# Patient Record
Sex: Female | Born: 1940 | Race: White | Hispanic: No | Smoking: Former smoker
Health system: Southern US, Community
[De-identification: ages and names within clinical notes are randomized; demographics above are authoritative.]

## PROBLEM LIST (undated history)

## (undated) DIAGNOSIS — I7300 Raynaud's syndrome without gangrene: Secondary | ICD-10-CM

## (undated) DIAGNOSIS — I341 Nonrheumatic mitral (valve) prolapse: Secondary | ICD-10-CM

## (undated) DIAGNOSIS — C9000 Multiple myeloma not having achieved remission: Secondary | ICD-10-CM

## (undated) DIAGNOSIS — M8580 Other specified disorders of bone density and structure, unspecified site: Secondary | ICD-10-CM

## (undated) DIAGNOSIS — J449 Chronic obstructive pulmonary disease, unspecified: Secondary | ICD-10-CM

## (undated) DIAGNOSIS — K635 Polyp of colon: Secondary | ICD-10-CM

## (undated) DIAGNOSIS — I739 Peripheral vascular disease, unspecified: Secondary | ICD-10-CM

## (undated) DIAGNOSIS — C4370 Malignant melanoma of unspecified lower limb, including hip: Secondary | ICD-10-CM

## (undated) DIAGNOSIS — B009 Herpesviral infection, unspecified: Secondary | ICD-10-CM

## (undated) DIAGNOSIS — N814 Uterovaginal prolapse, unspecified: Secondary | ICD-10-CM

## (undated) DIAGNOSIS — E039 Hypothyroidism, unspecified: Secondary | ICD-10-CM

## (undated) DIAGNOSIS — Z8619 Personal history of other infectious and parasitic diseases: Secondary | ICD-10-CM

## (undated) DIAGNOSIS — E7800 Pure hypercholesterolemia, unspecified: Secondary | ICD-10-CM

## (undated) DIAGNOSIS — I8390 Asymptomatic varicose veins of unspecified lower extremity: Secondary | ICD-10-CM

## (undated) DIAGNOSIS — Z860100 Personal history of colon polyps, unspecified: Secondary | ICD-10-CM

## (undated) HISTORY — PX: BREAST SURGERY: SHX581

## (undated) HISTORY — DX: Multiple myeloma not having achieved remission: C90.00

## (undated) HISTORY — DX: Raynaud's syndrome without gangrene: I73.00

## (undated) HISTORY — DX: Hypothyroidism, unspecified: E03.9

## (undated) HISTORY — DX: Personal history of other infectious and parasitic diseases: Z86.19

## (undated) HISTORY — DX: Nonrheumatic mitral (valve) prolapse: I34.1

## (undated) HISTORY — DX: Polyp of colon: K63.5

## (undated) HISTORY — PX: OTHER SURGICAL HISTORY: SHX169

## (undated) HISTORY — DX: Personal history of colon polyps, unspecified: Z86.0100

## (undated) HISTORY — DX: Peripheral vascular disease, unspecified: I73.9

## (undated) HISTORY — PX: TONSILLECTOMY: SUR1361

## (undated) HISTORY — DX: Uterovaginal prolapse, unspecified: N81.4

## (undated) HISTORY — DX: Personal history of colonic polyps: Z86.010

## (undated) HISTORY — DX: Asymptomatic varicose veins of unspecified lower extremity: I83.90

## (undated) HISTORY — DX: Other specified disorders of bone density and structure, unspecified site: M85.80

## (undated) HISTORY — DX: Malignant melanoma of unspecified lower limb, including hip: C43.70

## (undated) HISTORY — DX: Pure hypercholesterolemia, unspecified: E78.00

## (undated) HISTORY — DX: Herpesviral infection, unspecified: B00.9

---

## 1997-07-25 ENCOUNTER — Encounter: Admission: RE | Admit: 1997-07-25 | Discharge: 1997-10-23 | Payer: Self-pay | Admitting: Internal Medicine

## 1997-08-01 ENCOUNTER — Encounter: Admission: RE | Admit: 1997-08-01 | Discharge: 1997-08-09 | Payer: Self-pay | Admitting: Internal Medicine

## 1997-09-09 ENCOUNTER — Other Ambulatory Visit: Admission: RE | Admit: 1997-09-09 | Discharge: 1997-09-09 | Payer: Self-pay | Admitting: *Deleted

## 1997-09-24 ENCOUNTER — Emergency Department (HOSPITAL_COMMUNITY): Admission: EM | Admit: 1997-09-24 | Discharge: 1997-09-24 | Payer: Self-pay | Admitting: Emergency Medicine

## 1997-10-06 ENCOUNTER — Emergency Department (HOSPITAL_COMMUNITY): Admission: EM | Admit: 1997-10-06 | Discharge: 1997-10-06 | Payer: Self-pay | Admitting: Emergency Medicine

## 1997-12-30 ENCOUNTER — Encounter: Admission: RE | Admit: 1997-12-30 | Discharge: 1998-03-30 | Payer: Self-pay | Admitting: *Deleted

## 1998-01-06 ENCOUNTER — Encounter: Admission: RE | Admit: 1998-01-06 | Discharge: 1998-04-06 | Payer: Self-pay | Admitting: *Deleted

## 1998-05-29 ENCOUNTER — Other Ambulatory Visit: Admission: RE | Admit: 1998-05-29 | Discharge: 1998-05-29 | Payer: Self-pay | Admitting: Internal Medicine

## 1998-08-05 ENCOUNTER — Other Ambulatory Visit: Admission: RE | Admit: 1998-08-05 | Discharge: 1998-08-05 | Payer: Self-pay | Admitting: *Deleted

## 1999-09-30 ENCOUNTER — Other Ambulatory Visit: Admission: RE | Admit: 1999-09-30 | Discharge: 1999-09-30 | Payer: Self-pay | Admitting: *Deleted

## 2000-06-27 ENCOUNTER — Encounter: Admission: RE | Admit: 2000-06-27 | Discharge: 2000-09-25 | Payer: Self-pay | Admitting: Occupational Medicine

## 2000-09-26 ENCOUNTER — Encounter: Admission: RE | Admit: 2000-09-26 | Discharge: 2000-11-03 | Payer: Self-pay | Admitting: Occupational Medicine

## 2000-10-11 ENCOUNTER — Other Ambulatory Visit: Admission: RE | Admit: 2000-10-11 | Discharge: 2000-10-11 | Payer: Self-pay | Admitting: *Deleted

## 2001-01-31 ENCOUNTER — Encounter: Admission: RE | Admit: 2001-01-31 | Discharge: 2001-03-13 | Payer: Self-pay | Admitting: Occupational Medicine

## 2001-11-17 ENCOUNTER — Other Ambulatory Visit: Admission: RE | Admit: 2001-11-17 | Discharge: 2001-11-17 | Payer: Self-pay | Admitting: Obstetrics and Gynecology

## 2002-11-21 ENCOUNTER — Encounter (INDEPENDENT_AMBULATORY_CARE_PROVIDER_SITE_OTHER): Payer: Self-pay | Admitting: *Deleted

## 2002-11-21 ENCOUNTER — Ambulatory Visit (HOSPITAL_BASED_OUTPATIENT_CLINIC_OR_DEPARTMENT_OTHER): Admission: RE | Admit: 2002-11-21 | Discharge: 2002-11-21 | Payer: Self-pay | Admitting: General Surgery

## 2002-11-21 ENCOUNTER — Ambulatory Visit (HOSPITAL_COMMUNITY): Admission: RE | Admit: 2002-11-21 | Discharge: 2002-11-21 | Payer: Self-pay | Admitting: General Surgery

## 2003-02-27 ENCOUNTER — Other Ambulatory Visit: Admission: RE | Admit: 2003-02-27 | Discharge: 2003-02-27 | Payer: Self-pay | Admitting: Obstetrics and Gynecology

## 2004-05-05 ENCOUNTER — Other Ambulatory Visit: Admission: RE | Admit: 2004-05-05 | Discharge: 2004-05-05 | Payer: Self-pay | Admitting: Obstetrics and Gynecology

## 2004-07-06 ENCOUNTER — Ambulatory Visit: Payer: Self-pay | Admitting: Pulmonary Disease

## 2005-09-03 ENCOUNTER — Other Ambulatory Visit: Admission: RE | Admit: 2005-09-03 | Discharge: 2005-09-03 | Payer: Self-pay | Admitting: Obstetrics and Gynecology

## 2005-09-08 ENCOUNTER — Ambulatory Visit: Payer: Self-pay | Admitting: Pulmonary Disease

## 2005-09-08 ENCOUNTER — Ambulatory Visit: Payer: Self-pay | Admitting: Family Medicine

## 2006-02-01 ENCOUNTER — Ambulatory Visit: Payer: Self-pay | Admitting: Pulmonary Disease

## 2006-10-06 ENCOUNTER — Ambulatory Visit: Payer: Self-pay | Admitting: Pulmonary Disease

## 2006-10-06 LAB — CONVERTED CEMR LAB
BUN: 12 mg/dL (ref 6–23)
Basophils Absolute: 0 10*3/uL (ref 0.0–0.1)
Bilirubin Urine: NEGATIVE
Bilirubin, Direct: 0.1 mg/dL (ref 0.0–0.3)
Direct LDL: 101 mg/dL
Eosinophils Absolute: 0.2 10*3/uL (ref 0.0–0.6)
GFR calc Af Amer: 129 mL/min
GFR calc non Af Amer: 106 mL/min
Glucose, Bld: 101 mg/dL — ABNORMAL HIGH (ref 70–99)
HDL: 96.7 mg/dL (ref >39.0)
Hemoglobin, Urine: NEGATIVE
Hemoglobin: 14.6 g/dL (ref 12.0–15.0)
Leukocytes, UA: NEGATIVE
Lymphocytes Relative: 51.3 % — ABNORMAL HIGH (ref 12.0–46.0)
MCHC: 33.8 g/dL (ref 30.0–36.0)
MCV: 96.3 fL (ref 78.0–100.0)
Monocytes Absolute: 1.4 10*3/uL — ABNORMAL HIGH (ref 0.2–0.7)
Monocytes Relative: 11.3 % — ABNORMAL HIGH (ref 3.0–11.0)
Neutro Abs: 4.4 10*3/uL (ref 1.4–7.7)
Nitrite: NEGATIVE
Potassium: 4.7 meq/L (ref 3.5–5.1)
Sodium: 141 meq/L (ref 135–145)
TSH: 0.86 microintl units/mL (ref 0.35–5.50)
Total CHOL/HDL Ratio: 2.2
Total Protein, Urine: NEGATIVE mg/dL
Triglycerides: 47 mg/dL (ref 0–149)
VLDL: 9 mg/dL (ref 0–40)
Vit D, 1,25-Dihydroxy: 38 (ref 20–57)

## 2007-03-10 ENCOUNTER — Other Ambulatory Visit: Admission: RE | Admit: 2007-03-10 | Discharge: 2007-03-10 | Payer: Self-pay | Admitting: Obstetrics and Gynecology

## 2007-11-06 ENCOUNTER — Telehealth (INDEPENDENT_AMBULATORY_CARE_PROVIDER_SITE_OTHER): Payer: Self-pay | Admitting: *Deleted

## 2007-11-09 ENCOUNTER — Ambulatory Visit: Payer: Self-pay | Admitting: Internal Medicine

## 2007-11-09 ENCOUNTER — Encounter: Payer: Self-pay | Admitting: Pulmonary Disease

## 2007-11-10 ENCOUNTER — Encounter: Payer: Self-pay | Admitting: Pulmonary Disease

## 2007-12-08 ENCOUNTER — Telehealth: Payer: Self-pay | Admitting: Pulmonary Disease

## 2008-01-17 DIAGNOSIS — D126 Benign neoplasm of colon, unspecified: Secondary | ICD-10-CM

## 2008-01-17 DIAGNOSIS — Z8679 Personal history of other diseases of the circulatory system: Secondary | ICD-10-CM

## 2008-01-17 DIAGNOSIS — F411 Generalized anxiety disorder: Secondary | ICD-10-CM

## 2008-01-17 DIAGNOSIS — E039 Hypothyroidism, unspecified: Secondary | ICD-10-CM

## 2008-01-18 ENCOUNTER — Ambulatory Visit: Payer: Self-pay | Admitting: Pulmonary Disease

## 2008-01-18 DIAGNOSIS — M899 Disorder of bone, unspecified: Secondary | ICD-10-CM | POA: Insufficient documentation

## 2008-01-18 DIAGNOSIS — M949 Disorder of cartilage, unspecified: Secondary | ICD-10-CM

## 2008-01-18 DIAGNOSIS — E7800 Pure hypercholesterolemia, unspecified: Secondary | ICD-10-CM

## 2008-04-02 ENCOUNTER — Encounter: Payer: Self-pay | Admitting: Pulmonary Disease

## 2008-05-31 DIAGNOSIS — I8390 Asymptomatic varicose veins of unspecified lower extremity: Secondary | ICD-10-CM

## 2008-06-07 ENCOUNTER — Ambulatory Visit: Payer: Self-pay | Admitting: Internal Medicine

## 2008-06-24 ENCOUNTER — Ambulatory Visit: Payer: Self-pay | Admitting: Internal Medicine

## 2008-06-24 ENCOUNTER — Telehealth: Payer: Self-pay | Admitting: Internal Medicine

## 2008-06-24 ENCOUNTER — Encounter: Payer: Self-pay | Admitting: Internal Medicine

## 2008-06-25 ENCOUNTER — Encounter: Payer: Self-pay | Admitting: Internal Medicine

## 2008-07-22 ENCOUNTER — Telehealth: Payer: Self-pay | Admitting: Internal Medicine

## 2008-11-15 ENCOUNTER — Encounter: Payer: Self-pay | Admitting: Pulmonary Disease

## 2008-11-28 ENCOUNTER — Encounter: Payer: Self-pay | Admitting: Pulmonary Disease

## 2009-03-10 ENCOUNTER — Telehealth: Payer: Self-pay | Admitting: Pulmonary Disease

## 2009-03-11 ENCOUNTER — Ambulatory Visit: Payer: Self-pay | Admitting: Pulmonary Disease

## 2009-03-13 LAB — CONVERTED CEMR LAB
ALT: 21 units/L (ref 0–35)
BUN: 11 mg/dL (ref 6–23)
Basophils Relative: 0.8 % (ref 0.0–3.0)
CO2: 27 meq/L (ref 19–32)
Chloride: 108 meq/L (ref 96–112)
Eosinophils Relative: 0.7 % (ref 0.0–5.0)
Glucose, Bld: 83 mg/dL (ref 70–99)
HDL: 88.5 mg/dL (ref >39.00)
Hemoglobin: 14.1 g/dL (ref 12.0–15.0)
Lymphocytes Relative: 41.8 % (ref 12.0–46.0)
Monocytes Relative: 9.2 % (ref 3.0–12.0)
Neutrophils Relative %: 47.5 % (ref 43.0–77.0)
Potassium: 4.6 meq/L (ref 3.5–5.1)
RBC: 4.37 M/uL (ref 3.87–5.11)
TSH: 0.66 microintl units/mL (ref 0.35–5.50)
Total Protein: 7.8 g/dL (ref 6.0–8.3)
Triglycerides: 52 mg/dL (ref 0.0–149.0)
VLDL: 10.4 mg/dL (ref 0.0–40.0)
Vit D, 25-Hydroxy: 51 ng/mL (ref 30–89)
WBC: 12.9 10*3/uL — ABNORMAL HIGH (ref 4.5–10.5)

## 2009-03-14 ENCOUNTER — Ambulatory Visit: Payer: Self-pay | Admitting: Pulmonary Disease

## 2009-03-18 LAB — CONVERTED CEMR LAB
Specific Gravity, Urine: <=1.005 (ref 1.000–1.030)
Total Protein, Urine: NEGATIVE mg/dL
Urine Glucose: NEGATIVE mg/dL
Urobilinogen, UA: 0.2 (ref 0.0–1.0)
pH: 5 (ref 5.0–8.0)

## 2009-11-17 ENCOUNTER — Encounter: Payer: Self-pay | Admitting: Pulmonary Disease

## 2010-03-11 ENCOUNTER — Other Ambulatory Visit: Payer: Self-pay | Admitting: Pulmonary Disease

## 2010-03-11 ENCOUNTER — Ambulatory Visit
Admission: RE | Admit: 2010-03-11 | Discharge: 2010-03-11 | Payer: Self-pay | Source: Home / Self Care | Attending: Pulmonary Disease | Admitting: Pulmonary Disease

## 2010-03-11 LAB — CBC WITH DIFFERENTIAL/PLATELET
Basophils Absolute: 0.1 10*3/uL (ref 0.0–0.1)
Eosinophils Absolute: 0.2 10*3/uL (ref 0.0–0.7)
Lymphocytes Relative: 50 % — ABNORMAL HIGH (ref 12.0–46.0)
MCHC: 33.5 g/dL (ref 30.0–36.0)
MCV: 97.9 fl (ref 78.0–100.0)
Monocytes Absolute: 1 10*3/uL (ref 0.1–1.0)
Neutro Abs: 5.3 10*3/uL (ref 1.4–7.7)
Neutrophils Relative %: 40.6 % — ABNORMAL LOW (ref 43.0–77.0)
RDW: 14 % (ref 11.5–14.6)

## 2010-03-11 LAB — TSH: TSH: 0.85 u[IU]/mL (ref 0.35–5.50)

## 2010-03-11 LAB — BASIC METABOLIC PANEL
BUN: 14 mg/dL (ref 6–23)
Calcium: 9.7 mg/dL (ref 8.4–10.5)
Creatinine, Ser: 0.5 mg/dL (ref 0.4–1.2)
GFR: 121.4 mL/min (ref >60.00)
Sodium: 142 mEq/L (ref 135–145)

## 2010-03-11 LAB — LIPID PANEL: HDL: 89.4 mg/dL (ref >39.00)

## 2010-03-11 LAB — LDL CHOLESTEROL, DIRECT: Direct LDL: 116.6 mg/dL

## 2010-03-11 LAB — HEPATIC FUNCTION PANEL
AST: 22 U/L (ref 0–37)
Total Bilirubin: 0.8 mg/dL (ref 0.3–1.2)

## 2010-03-15 LAB — CONVERTED CEMR LAB
AST: 23 units/L (ref 0–37)
Bilirubin Urine: NEGATIVE
Bilirubin, Direct: 0.1 mg/dL (ref 0.0–0.3)
CO2: 29 meq/L (ref 19–32)
Calcium: 9.7 mg/dL (ref 8.4–10.5)
Chloride: 108 meq/L (ref 96–112)
Creatinine, Ser: 0.6 mg/dL (ref 0.4–1.2)
GFR calc non Af Amer: 106 mL/min
HDL: 80.7 mg/dL (ref >39.0)
LDL Cholesterol: 96 mg/dL (ref 0–99)
Leukocytes, UA: NEGATIVE
MCV: 97.2 fL (ref 78.0–100.0)
Mucus, UA: NEGATIVE
Nitrite: NEGATIVE
Platelets: 247 10*3/uL (ref 150–400)
RBC / HPF: NONE SEEN
RBC: 4.19 M/uL (ref 3.87–5.11)
Sodium: 144 meq/L (ref 135–145)
Specific Gravity, Urine: <=1.005 (ref 1.000–1.03)
TSH: 0.44 microintl units/mL (ref 0.35–5.50)
Total Bilirubin: 0.8 mg/dL (ref 0.3–1.2)
Total CHOL/HDL Ratio: 2.4
Total Protein, Urine: NEGATIVE mg/dL
VLDL: 13 mg/dL (ref 0–40)
WBC: 12.3 10*3/uL — ABNORMAL HIGH (ref 4.5–10.5)
pH: 5.5 (ref 5.0–8.0)

## 2010-03-17 NOTE — Assessment & Plan Note (Signed)
Summary: cpx/fasting/apc   CC:  Yearly ROV & review of mult medical problems....  History of Present Illness: 70 y/o WF here for a follow up visit... she has multiple medical problems as noted below...     ~  March 11, 2009:  she's had a good yr- no new complaints or concerns... unfortunately she continues to smoke 1/2 ppd- denies smoker's cough, phlegm, SOB/ DOE, etc... she is not interested in smoking cessation help> we discussed working w/ her husb to help him lose wt & her to quit smoking... she does note some dysuria, & right ear pain> check UA & Rx w/ Cortisporin Otic... she uses Cimetadine 300mg  Prn for "upset stomach" & requests refill... she had colonoscopy 5/10  DrBrodie w/ mild divertics & cecal adenoma removed...      Current Problems:   ALLERGIC RHINITIS - uses FLONASE Prn...  ASTHMATIC BRONCHITIS, ACUTE (ICD-466.0) - no recent bronchitic exac... not on regular meds.  ~  CXR 12/09 showed hyperinflation & incr markings, spondylosis in TSpine...  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  CIGARETTE SMOKER (ICD-305.1) - still smokes 1/2 ppd... we have had numerous conversations about smoking cessation during every OV but she is not interested in med rx or help w/ this problem...  MITRAL VALVE PROLAPSE, HX OF (ICD-V12.50) - on ASA 81mg /d... hx mild MVP on 2DEcho in 1995 (no signif MR)... asymptomatic without CP or palpit...  VARICOSE VEIN (ICD-456.8) - hx mild VV and ven insuffic w/ intermittent edema... she knows to avoid sodium, elevate legs, wear support hose, etc...  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  ~  colonoscopy 4/00 by DrDBrodie showed several 6-24mm polyps= adenomatous...  ~  colonoscopy 4/05 by DrDBrodie showed only melanosis and hems...  ~  colonoscopy 5/10 DrDBrodie showed mild divertics, 2 cecal polyps- tubular adenoma, f/u 46yrs.  OSTEOPENIA (ICD-733.90) - on FOSAMAX 70mg /wk, calcium, MVI, Vit D 1000 u daily...  ~  BMD here 8/07 showed TScores -1.2 in Spine, and -1.8 in left FemNeck.  ~  labs 8/08 showed Vit D level= 38  ~  BMD here 9/09 showed TScores -0.8 in Spine, and -1.5 in left FemNeck.  ~  labs 1/11 showed Vit D level = 51  ANXIETY (ICD-300.00) - not currently on meds.  Health Maintenance - neg Mamogram at [facility] 9/09 & 10/10... due for GYN check by [facility] & she will call.  ~  Immunizations:  she had the seasonal Flu vaccine in 2009, but refused the H1N1 shot; received the 2010 Flu shot 10/10...  she received Shingles vaccine 2/09 at [facility]... had PNEUMOVAX in 2002...     Allergies: 1)  ! Codeine  Comments:  Nurse/Medical Assistant: The patient's medications and allergies were reviewed with the patient and were updated in the Medication and Allergy Lists.  Past History:  Past Medical History: ASTHMATIC BRONCHITIS, ACUTE (ICD-466.0) CIGARETTE SMOKER (ICD-305.1) MITRAL VALVE PROLAPSE, HX OF (ICD-V12.50) VARICOSE VEIN (ICD-456.8) HYPERCHOLESTEROLEMIA, BORDERLINE, WITH HIGH HDL (ICD-272.0) HYPOTHYROIDISM (ICD-244.9) COLONIC POLYPS (ICD-211.3) OSTEOPENIA (ICD-733.90) ANXIETY (  ICD-300.00)  Past Surgical History: S/P T & A S/P C-section S/P right breast lumpectomy 10/04 by DrPYoung (benign)  Family History: Reviewed history and no changes required. mother deceased age 29 from heart disease father deceased age 70 from heart disease only child grandmother hx of DM after age 49  Social History: Reviewed history and no changes required. current smoker---1/2 ppd exposed to second hand smoke exercises 3 times per week--walks  and run steps at work caffeine use--4 cups daily no alcohol use married 1 child  Review of Systems      See HPI  The patient denies anorexia, fever, weight loss, weight gain, vision loss, decreased hearing, hoarseness, chest pain, syncope, dyspnea on exertion, peripheral edema, prolonged cough, headaches, hemoptysis, abdominal pain, melena, hematochezia, severe indigestion/heartburn, hematuria, incontinence, muscle weakness, suspicious skin lesions, transient blindness, difficulty walking, depression, unusual weight change, abnormal bleeding, enlarged lymph nodes, and angioedema.    Vital Signs:  Patient profile:   70 year old female Height:      62 inches Weight:      134 pounds BMI:     24.60 O2 Sat:      98 % on Room air Temp:     97.4 degrees F oral Pulse rate:   68 / minute BP sitting:   150 / 72  (right arm) Cuff size:   regular  Vitals Entered By: Randell Loop CMA (March 11, 2009 9:52 AM)  O2 Sat at Rest %:  98 O2 Flow:  Room air CC: Yearly ROV & review of mult medical problems... Comments no changes in meds-----BP repeated in right arm---138-72 and BP taken in left arm---130-68   Physical Exam  Additional Exam:  WD, WN, 70 y/o WF in NAD...  GENERAL:  Alert & oriented; pleasant & cooperative... HEENT:  [place]/AT, EOM-wnl, PERRLA,  EACs-clear, TMs-wnl, NOSE-clear, THROAT-clear & wnl. NECK:  Supple w/ fairROM; no JVD; normal carotid impulses w/o bruits; no thyromegaly or nodules palpated; no lymphadenopathy. CHEST:  Clear to P & A; without wheezes/ rales/ or rhonchi. HEART:  Regular Rhythm; without murmurs/ rubs/ or gallops. ABDOMEN:  Soft & nontender; normal bowel sounds; no organomegaly or masses detected. EXT: without deformities or arthritic changes; mild varicose veins/ venous insuffic/ tr edema. NEURO:  CN's intact; motor testing normal; sensory testing normal; gait normal & balance OK. DERM:  No lesions noted; no rash etc...     CXR  Procedure date:   03/11/2009  Findings:      CHEST - 2 VIEW   Comparison: Chest x-ray of 01/18/2008   Findings: The lungs are clear.  Mediastinal contours are normal. The heart is within normal limits in size.  There are degenerative changes throughout the thoracic spine.   IMPRESSION: No active lung disease.   Read By:  Juline Patch,  M.D.       MISC. Report  Procedure date:  03/11/2009  Findings:      Lipid Panel (LIPID)   Cholesterol          [H]  206 mg/dL                   2-956   Triglycerides             52.0 mg/dL                  2.1-308.6   HDL  88.50 mg/dL                 >16.10 Cholesterol LDL - Direct                             104.7 mg/dL  Hepatic/Liver Function Panel (HEPATIC)   Total Bilirubin           0.5 mg/dL                   9.6-0.4   Direct Bilirubin          0.1 mg/dL                   5.4-0.9   Alkaline Phosphatase      53 U/L                      39-117   AST                       30 U/L                      0-37   ALT                       21 U/L                      0-35   Total Protein             7.8 g/dL                    8.1-1.9   Albumin                   4.1 g/dL                    1.4-7.8  CBC Platelet w/Diff (CBCD)   White Cell Count     [H]  12.9 K/uL                   4.5-10.5   Red Cell Count            4.37 Mil/uL                 3.87-5.11   Hemoglobin                14.1 g/dL                   29.5-62.1   Hematocrit                43.3 %                      36.0-46.0   MCV                       99.2 fl                     78.0-100.0   Platelet Count            248.0 K/uL                  150.0-400.0   Neutrophil %              47.5 %  43.0-77.0   Lymphocyte %              41.8 %                      12.0-46.0   Monocyte %                9.2 %                       3.0-12.0   Eosinophils%              0.7 %                       0.0-5.0   Basophils %               0.8 %                Comments:       BMP (METABOL)   Sodium                    142 mEq/L                   135-145   Potassium                 4.6 mEq/L                   3.5-5.1   Chloride                  108 mEq/L                   96-112   Carbon Dioxide            27 mEq/L                    19-32   Glucose                   83 mg/dL                    04-54   BUN                       11 mg/dL                    0-98   Creatinine                0.7 mg/dL                   1.1-9.1   Calcium                   9.6 mg/dL                   4.7-82.9   GFR                       88.32 mL/min                >60  TSH (TSH)   FastTSH                   0.66 uIU/mL                 0.35-5.50   Impression & Recommendations:  Problem # 1:  ASTHMATIC  BRONCHITIS, ACUTE (ICD-466.0) No recent exac... CXR is OK. Orders: T-2 View CXR (71020TC)  Problem # 2:  CIGARETTE SMOKER (ICD-305.1) We discussed smoking cessation strategies including cessation programs, counselling, nicotine replacement, and Chantix receptor blockade... the pt is not interested at this time but we left the door open should she like to reconsider at any time.   Problem # 3:  MITRAL VALVE PROLAPSE, HX OF (ICD-V12.50) No angina or cardiac symptoms...  Problem # 4:  HYPERCHOLESTEROLEMIA, BORDERLINE, WITH HIGH HDL (ICD-272.0) FLP looks OK, w/ good HDL...   Problem # 5:  HYPOTHYROIDISM (ICD-244.9) Stable on Synthroid-  continue same. Her updated medication list for this problem includes:    Levothroid 100 Mcg Tabs (Levothyroxine sodium) .[name]... 1 tablet by mouth once a day  Problem # 6:  COLONIC POLYPS (ICD-211.3) GI per DrDBrodie...   Problem # 7:  OSTEOPENIA (ICD-733.90) Continue Fosamax etc... Her updated medication list for this problem includes:    Alendronate Sodium 70 Mg Tabs (Alendronate sodium) .[name]... 1 by mouth every week  Problem # 8:  OTHER MEDICAL PROBLEMS AS NOTED>>>  Complete Medication List: 1)  Fluticasone Propionate 50 Mcg/act Susp  (Fluticasone propionate) .... 2 sprays in each nostirl two times a day 2)  Aspirin Adult Low Strength 81 Mg Tbec (Aspirin) .... Take 1 tablet by mouth once a day 3)  Levothroid 100 Mcg Tabs (Levothyroxine sodium) .[name].. 1 tablet by mouth once a day 4)  Cimetidine 300 Mg Tabs (Cimetidine) .... Take 1 tablet by mouth once a day 5)  Alendronate Sodium 70 Mg Tabs (Alendronate sodium) .[name].. 1 by mouth every week 6)  Multivitamins Caps (Multiple vitamin) .... Take 1 tablet by mouth once a day 7)  Vitamin D 1000 Unit Tabs (Cholecalciferol) .... .take 1 tablet by mouth once a day 8)  Lutein 20 Mg Tabs (Lutein) .... Take 1 tablet by mouth once a day 9)  Anamantle Hc 3-0.5 % Crea (Lidocaine-hydrocortisone ace) .... Apply to rectum 3-4 times daily. 10)  Anusol-hc 25 Mg Supp (Hydrocortisone acetate) .... Put one into rectum twice daily for 5 days. 11)  Cortisporin-tc 3.04-17-08-0.5 Mg/ml Susp (Neomycin-colist-hc-thonzonium) .[name].. 1-2 drops in right ear three times a day as directed...  Other Orders: Prescription Created Electronically [phone])  Patient Instructions: 1)  Today we updated your med list- see below.... 2)  We refilled your meds per request... 3)  Today we did your follow up CXR & FASTING blood work... please call the "phone tree" in a few days for your lab results.Marland KitchenMarland Kitchen 4)  Nasreen- you need to quit smoking!!!  Work out a deal w/ Merlyn Albert- "cigarettes for pounds" it's a WIN-WIN.Marland KitchenMarland Kitchen 5)  Call for any problems.Marland KitchenMarland Kitchen 6)  Please schedule a follow-up appointment in 1 year, sooner as needed... Prescriptions: CORTISPORIN-TC 3.04-17-08-0.5 MG/ML SUSP (NEOMYCIN-COLIST-HC-THONZONIUM) 1-2 drops in right ear three times a day as directed...  #1 vial x 1   Entered and Authorized by:   Michele Mcalpine MD   Signed by:   Michele Mcalpine MD on 03/11/2009   Method used:   Print then Give to Patient   RxID:   [id number] CIMETIDINE 300 MG TABS (CIMETIDINE) Take 1 tablet by mouth once a day  #30 x prn   Entered and Authorized  by:   Michele Mcalpine MD   Signed by:   Michele Mcalpine MD on 03/11/2009   Method used:   Print then Give to Patient   RxID:   [id number] FLUTICASONE PROPIONATE 50 MCG/ACT SUSP (  FLUTICASONE PROPIONATE) 2 sprays in each nostirl two times a day  #1 x prn   Entered and Authorized by:   Michele Mcalpine MD   Signed by:   Michele Mcalpine MD on 03/11/2009   Method used:   Print then Give to Patient   RxID:   [id number] ALENDRONATE SODIUM 70 MG TABS (ALENDRONATE SODIUM) 1 by mouth every week  #4 x prn   Entered and Authorized by:   Michele Mcalpine MD   Signed by:   Michele Mcalpine MD on 03/11/2009   Method used:   Print then Give to Patient   RxID:   [id number] LEVOTHROID 100 MCG TABS (LEVOTHYROXINE SODIUM) 1 tablet by mouth once a day  #30 x prn   Entered and Authorized by:   Michele Mcalpine MD   Signed by:   Michele Mcalpine MD on 03/11/2009   Method used:   Print then Give to Patient   RxID:   [id number]

## 2010-03-17 NOTE — Progress Notes (Signed)
Summary: ORDER FOR LABS  Phone Note Call from Patient   Caller: Patient Call For: Countess Biebel Summary of Call: NEED ORDER FOR LABS PUT IN FOR CPX Initial call taken by: Rickard Patience,  March 10, 2009 10:57 AM  Follow-up for Phone Call        Patient coming in for CPX on 03/11/09. Please advise.Michel Bickers CMA  March 10, 2009 11:00 AM   ok for pt to have labs prior to ov per SN..  labs in computer for pt and pt is aware Randell Loop [facility]  March 10, 2009 11:40 AM

## 2010-03-24 DIAGNOSIS — K219 Gastro-esophageal reflux disease without esophagitis: Secondary | ICD-10-CM | POA: Insufficient documentation

## 2010-04-02 NOTE — Assessment & Plan Note (Signed)
Summary: 1 year return/mhh   CC:  Yearly ROV & review of mult medical problems....  History of Present Illness: 70 y/o WF here for a follow up visit... she has multiple medical problems as noted below...     ~  March 11, 2009:  she's had a good yr- no new complaints or concerns... unfortunately she continues to smoke 1/2 ppd- denies smoker's cough, phlegm, SOB/ DOE, etc... she is not interested in smoking cessation help> we discussed working w/ her husb to help him lose wt & her to quit smoking... she does note some dysuria, & right ear pain> check UA & Rx w/ Cortisporin Otic... she uses Cimetadine 300mg  Prn for "upset stomach" & requests refill... she had colonoscopy 5/10  DrBrodie w/ mild divertics & cecal adenoma removed...    ~  March 11, 2010:  she tripped 6/11 w/ fx 5th metatarsal req cast, boot, etc per DrRendall- now resolved... still smoking but down less than 1/2 ppd she says & denies smokers cough, sputum, SOB/ DOE, etc... denies CP, palpit, or cardiac problems- exerc by walking some she says... she reports prolapsed bladder per GYN... CXR- NAD, & Labs- stable w/ hi HDL.Marland KitchenMarland Kitchen OK Pneumovax & TDAP today.   Current Problems:   ALLERGIC RHINITIS - uses FLONASE Prn...  ASTHMATIC BRONCHITIS, ACUTE (ICD-466.0) - no recent bronchitic exac... not on regular meds.  ~  CXR 12/09 showed hyperinflation & incr markings, spondylosis in TSpine...  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  CIGARETTE SMOKER (ICD-305.1) - still smokes 1/2 ppd... we have had numerous conversations about smoking cessation during every OV but she is not interested in med rx or help w/ this problem...  MITRAL VALVE PROLAPSE, HX OF (ICD-V12.50) - on ASA 81mg /d... hx mild MVP on 2DEcho in 1995 (no signif MR)... asymptomatic without CP or palpit...  VARICOSE VEIN (ICD-456.8) - hx mild VV and ven insuffic w/ intermittent edema... she knows to avoid sodium, elevate legs, wear support hose,  etc...  HYPERCHOLESTEROLEMIA, BORDERLINE, WITH HIGH HDL (ICD-272.0)  ~  FLP 8/08 showed TChol 211, TG 47, HDL 97, LDL 101  ~  FLP 12/09 showed TChol 190, TG 65, HDL 81, LDL 96  ~  FLP 1/11 showed TChol 206, TG 52, HDL 89, LDL 105  ~  FLP 1/12 showed TChol 221, TG 62, HDL 89, LDL 117  HYPOTHYROIDISM (ICD-244.9) - on LEVOTHYROID 173mcg/d...  ~  labs 12/09 showed TSH= 0.44  ~  labs 1/11 showed TSH= 0.66  ~  labs 1/12 showed TSH= 0.85  ESOPHAGEAL REFLUX (ICD-530.81) - she uses Cimetadine 300mg  Prn for "Stomach" & requests refill from [place]...  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  ~  colonoscopy 4/00 by DrDBrodie showed several 6-20mm polyps= adenomatous...  ~  colonoscopy 4/05 by DrDBrodie showed only melanosis and hems...  ~  colonoscopy 5/10 DrDBrodie showed mild divertics, 2 cecal polyps- tubular adenoma, f/u 33yrs.  OSTEOPENIA (ICD-733.90) - on calcium, MVI, Vit D 2000 u daily... she stopped her prev Fosamax rx on her own in 2011.  ~  BMD here 8/07 showed TScores -1.2 in Spine, and -1.8 in left FemNeck.  ~  labs 8/08 showed Vit D level= 38  ~  BMD here 9/09 showed TScores -0.8 in Spine, and -1.5 in left FemNeck.  ~  labs 1/11 showed Vit D level = 51  ANXIETY (ICD-300.00) - not currently on meds.  Health Maintenance:  ~  neg  Mamogram at [facility]- followed yearly... due for GYN check by [facility] & she will call.  ~  Immunizations:  she gets the yearly seasonal Flu vaccines... she received Shingles vaccine 2/09 at [facility]... had PNEUMOVAX in 2002 & 1/12 (age 21)... given TDAP 1/12.  Current Meds:  FLUTICASONE PROPIONATE 50 MCG/ACT SUSP (FLUTICASONE PROPIONATE) 2 sprays in each nostirl two times a day ASPIRIN ADULT LOW STRENGTH 81 MG TBEC (ASPIRIN) Take 1 tablet by mouth once a day LEVOTHROID 100 MCG TABS (LEVOTHYROXINE SODIUM) 1 tablet by mouth once a day CIMETIDINE 300 MG TABS (CIMETIDINE) Take 1 tablet by mouth once a day CALCIUM 1000 + D  1000-800 MG-UNIT TABS (CALCIUM CARB-CHOLECALCIFEROL) Take 1 tablet by mouth once a day MULTIVITAMINS  CAPS (MULTIPLE VITAMIN) Take 1 tablet by mouth once a day VITAMIN D 1000 UNIT  TABS (CHOLECALCIFEROL) .Take 2 tablets  by mouth once a day LUTEIN 20 MG TABS (LUTEIN) Take 1 tablet by mouth once a day ANAMANTLE HC 3-0.5 % CREA (LIDOCAINE-HYDROCORTISONE ACE) Apply to rectum 3-4 times daily. ANUSOL-HC 25 MG  SUPP (HYDROCORTISONE ACETATE) Put one into rectum twice daily for 5 days. CORTISPORIN-TC 3.04-17-08-0.5 MG/ML SUSP (NEOMYCIN-COLIST-HC-THONZONIUM) 1-2 drops in right ear three times a day as directed...    Preventive Screening-Counseling & Management  Alcohol-Tobacco     Smoking Status: current     Packs/Day: 0.5  Allergies: 1)  ! Codeine  Comments:  Nurse/Medical Assistant: The patient's medications and allergies were reviewed with the patient and were updated in the Medication and Allergy Lists.  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 mother deceased age 59 from heart disease father deceased age 70 from heart disease only child grandmother hx of DM after age 81  Social History: Reviewed history from 03/11/2009 and no changes required. current smoker---1/2 ppd exposed to second hand smoke exercises 3 times per week--walks and run steps at work caffeine use--4 cups daily no alcohol use married 1 child Packs/Day:  0.5  Review of Systems       The patient  complains of fatigue, dyspnea on exertion, cough, gas/bloating, incontinence, joint pain, arthritis, and anxiety.  The patient denies fever, chills, sweats, anorexia, weakness, malaise, weight loss, sleep disorder, blurring, diplopia, eye irritation, eye discharge, vision loss, eye pain, photophobia, earache, ear discharge, tinnitus, decreased hearing, nasal congestion, nosebleeds, sore throat, hoarseness, chest pain, palpitations, syncope, orthopnea, PND, peripheral edema, dyspnea at rest, excessive sputum, hemoptysis, wheezing, pleurisy, nausea, vomiting, diarrhea, constipation, change in bowel habits, abdominal pain, melena, hematochezia, jaundice, indigestion/heartburn, dysphagia, odynophagia, dysuria, hematuria, urinary frequency, urinary hesitancy, nocturia, back pain, joint swelling, muscle cramps, muscle weakness, stiffness, sciatica, restless legs, leg pain at night, leg pain with exertion, rash, itching, dryness, suspicious lesions, paralysis, paresthesias, seizures, tremors, vertigo, transient blindness, frequent falls, frequent headaches, difficulty walking, depression, memory loss, confusion, cold intolerance, heat intolerance, polydipsia, polyphagia, polyuria, unusual weight change, abnormal bruising, bleeding, enlarged lymph nodes, urticaria, allergic rash, hay fever, and recurrent infections.    Vital Signs:  Patient profile:   70 year old female Height:      62 inches Weight:      138.50 pounds BMI:  25.42 O2 Sat:      100 % on Room air Temp:     97.7 degrees F oral Pulse rate:   67 / minute BP sitting:   126 / 74  (right arm) Cuff size:   regular  Vitals Entered By: Randell Loop CMA (March 11, 2010 10:06 AM)  O2 Sat at Rest %:  100 O2 Flow:  Room air CC: Yearly ROV & review of mult medical problems... Is Patient Diabetic? No Pain Assessment Patient in pain? no      Comments meds updated today with pt   Physical Exam  Additional Exam:  WD, WN, 70 y/o WF in NAD...   GENERAL:  Alert & oriented; pleasant & cooperative... HEENT:  /AT, EOM-wnl, PERRLA, EACs-clear, TMs-wnl, NOSE-clear, THROAT-clear & wnl. NECK:  Supple w/ fairROM; no JVD; normal carotid impulses w/o bruits; no thyromegaly or nodules palpated; no lymphadenopathy. CHEST:  Clear to P & A; without wheezes/ rales/ or rhonchi. HEART:  Regular Rhythm; without murmurs/ rubs/ or gallops. ABDOMEN:  Soft & nontender; normal bowel sounds; no organomegaly or masses detected. EXT: without deformities or arthritic changes; mild varicose veins/ venous insuffic/ tr edema. NEURO:  CN's intact; motor testing normal; sensory testing normal; gait normal & balance OK. DERM:  No lesions noted; no rash etc...    Impression & Recommendations:  Problem # 1:  ASTHMATIC BRONCHITIS, ACUTE (ICD-466.0) She is minimally symptomatic & must quit smoking... she does not want regular meds, inhalers, etc... Orders: TLB-BMP (Basic Metabolic Panel-BMET) (80048-METABOL) TLB-Hepatic/Liver Function Pnl (80076-HEPATIC) TLB-CBC Platelet - w/Differential (85025-CBCD) TLB-Lipid Panel (80061-LIPID) TLB-TSH (Thyroid Stimulating Hormone) (84443-TSH) T-2 View CXR (71020TC)  Problem # 2:  CIGARETTE SMOKER (ICD-305.1) CXR w/o acute changes>  she knows the importance of smoking cessation & the risks of heart dis, vasc dis & strokes, lung dis & lung cancer etc... offered smoking cessation help but she is not interested beyond the OTC avil therapies... Orders: T-2 View CXR (71020TC)  Problem # 3:  MITRAL VALVE PROLAPSE, HX OF (ICD-V12.50) Stable w/o CP, palpit, SOB etc...  Problem # 4:  VARICOSE VEIN (ICD-456.8) Aware> she knows to avoid sodium, elevate, wear support hose, etc...  Problem # 5:  HYPERCHOLESTEROLEMIA, BORDERLINE, WITH HIGH HDL (ICD-272.0) She had borderline TChol & LDL w/ excellent HDL value... she is not inclined to take low dose statin Rx & prefers diet + exercise.  Problem # 6:  HYPOTHYROIDISM  (ICD-244.9) Stable on Levo100/d...  Her updated medication list for this problem includes:    Levothroid 100 Mcg Tabs (Levothyroxine sodium) .[name]... 1 tablet by mouth once a day  Problem # 7:  COLONIC POLYPS (ICD-211.3) Followed by DrDBrodie & up to date on screening etc...  Problem # 8:  OSTEOPENIA (ICD-733.90) She stopped her Fosamax about 1 yr ago... on Calcium, MVI, VitD & wt bearing exercises... we discussed f/u BMD to check for deterioration. The following medications were removed from the medication list:    Alendronate Sodium 70 Mg Tabs (Alendronate sodium) .[name]... 1 by mouth every week  Complete Medication List: 1)  Fluticasone Propionate 50 Mcg/act Susp (Fluticasone propionate) .... 2 sprays in each nostirl two times a day 2)  Aspirin Adult Low Strength 81 Mg Tbec (Aspirin) .... Take 1 tablet by mouth once a day 3)  Levothroid 100 Mcg Tabs (Levothyroxine sodium) .[name].. 1 tablet by mouth once a day 4)  Cimetidine 300 Mg Tabs (Cimetidine) .... Take 1 tablet by mouth once a day 5)  Calcium 1000 + D  1000-800 Mg-unit Tabs (Calcium carb-cholecalciferol) .... Take 1 tablet by mouth once a day 6)  Multivitamins Caps (Multiple vitamin) .... Take 1 tablet by mouth once a day 7)  Vitamin D 1000 Unit Tabs (Cholecalciferol) .... .take 2 tablets  by mouth once a day 8)  Lutein 20 Mg Tabs (Lutein) .... Take 1 tablet by mouth once a day 9)  Anamantle Hc 3-0.5 % Crea (Lidocaine-hydrocortisone ace) .... Apply to rectum 3-4 times daily. 10)  Anusol-hc 25 Mg Supp (Hydrocortisone acetate) .... Put one into rectum twice daily for 5 days. 11)  Cortisporin-tc 3.04-17-08-0.5 Mg/ml Susp (Neomycin-colist-hc-thonzonium) .[name].. 1-2 drops in right ear three times a day as directed...  Other Orders: Pneumococcal Vaccine (16109) Admin 1st Vaccine (60454) Tdap => 73yrs IM (09811) Admin of Any Addtl Vaccine (91478)  Patient Instructions: 1)  Today we updated your med list- see below.... 2)  We refilled your meds for  2012... 3)  Today we did your follow up CXR & FASTINg blood work... please call the "phone tree" in a few days for your lab results.Marland KitchenMarland Kitchen 4)  [place], you need to QUIT SMOKING (Pllleeeaaassee!) 5)  Call for any problems.Marland KitchenMarland Kitchen 6)  Please schedule a follow-up appointment in 1 year, sooner as needed. Prescriptions: CIMETIDINE 300 MG TABS (CIMETIDINE) Take 1 tablet by mouth once a day  #30 x prn   Entered and Authorized by:   Michele Mcalpine MD   Signed by:   Michele Mcalpine MD on 03/11/2010   Method used:   Print then Give to Patient   RxID:   [id number] LEVOTHROID 100 MCG TABS (LEVOTHYROXINE SODIUM) 1 tablet by mouth once a day  #30 x prn   Entered and Authorized by:   Michele Mcalpine MD   Signed by:   Michele Mcalpine MD on 03/11/2010   Method used:   Print then Give to Patient   RxID:   [id number] FLUTICASONE PROPIONATE 50 MCG/ACT SUSP (FLUTICASONE PROPIONATE) 2 sprays in each nostirl two times a day  #1 x prn   Entered and Authorized by:   Michele Mcalpine MD   Signed by:   Michele Mcalpine MD on 03/11/2010   Method used:   Print then Give to Patient   RxID:   [id number]    Immunization History:  Influenza Immunization History:    Influenza:  historical (10/28/2009)  Immunizations Administered:  Pneumonia Vaccine:    Vaccine Type: Pneumovax (Medicare)    Site: right deltoid    Mfr: Merck    Dose: 0.5 ml    Route: IM    Given by: Randell Loop CMA    Exp. Date: 07/10/2011    Lot #: [id number]    VIS given: 01/20/09 version given March 11, 2010.  Tetanus Vaccine:    Vaccine Type: Tdap    Site: left deltoid    Mfr: GlaxoSmithKline    Dose: 0.5 ml    Route: IM    Given by: Randell Loop CMA    Exp. Date: 12/05/2011    Lot #: [id number]    VIS given: 01/03/08 version given March 11, 2010.     Orders Added: 1)  Est. Patient Level V [29518] 2)  Pneumococcal Vaccine [90732] 3)  Admin 1st Vaccine [90471] 4)  Tdap => 45yrs IM [90715] 5)  Admin of Any Addtl Vaccine  [90472] 6)  TLB-BMP (Basic Metabolic Panel-BMET) [80048-METABOL] 7)  TLB-Hepatic/Liver Function Pnl [80076-HEPATIC] 8)  TLB-CBC Platelet - w/Differential [85025-CBCD] 9)  TLB-Lipid Panel [80061-LIPID]  10)  TLB-TSH (Thyroid Stimulating Hormone) [84443-TSH] 11)  T-2 View CXR [71020TC]

## 2010-07-03 NOTE — Op Note (Signed)
   NAME:  Ariel Holmes, Ariel Holmes                          ACCOUNT NO.:  [id number]   MEDICAL RECORD NO.:  [id number]                   PATIENT TYPE:  AMB   LOCATION:  [facility]SC                                  FACILITY:  [facility]   PHYSICIAN:  Rose Phi. Maple Hudson, M.D.                DATE OF BIRTH:  [birth year]   DATE OF PROCEDURE:  11/21/2002  DATE OF DISCHARGE:                                 OPERATIVE REPORT   PREOPERATIVE DIAGNOSIS:  Probable radial scar, right breast.   POSTOPERATIVE DIAGNOSIS:  Probable radial scar, right breast.   OPERATION PERFORMED:  Right breast biopsy with needle localization and  specimen mammography.   SURGEON:  Rose Phi. Maple Hudson, M.D.   ANESTHESIA:  MAC.   DESCRIPTION OF PROCEDURE:  The patient was placed on the operating table and  the right breast prepped and draped in the usual fashion.  The area in the  lateral portion of the right breast and the wire was coming in from the  lateral side.  A curvilinear incision was outlined with a marking pencil and  then thoroughly infiltrated with the local anesthetic.  Incision was made  and the wire delivered into the incision.  Then we did a wide excision of  the wire and surrounding tissue.  Hemostasis was obtained with the cautery.  Specimen oriented for the pathologist.  Specimen mammography confirmed the  removal of the lesion.  With good hemostasis, subcuticular closure with 4-0  Monocryl and Steri-Strips carried out.  Dressing applied.  The patient was  then transferred to the recovery room in satisfactory condition having  tolerated the procedure well.                                                Rose Phi. Maple Hudson, M.D.    PRY/MEDQ  D:  11/21/2002  T:  11/21/2002  Job:  [id number]

## 2010-11-23 ENCOUNTER — Encounter: Payer: Self-pay | Admitting: Pulmonary Disease

## 2010-12-11 ENCOUNTER — Other Ambulatory Visit: Payer: Self-pay | Admitting: Dermatology

## 2010-12-22 ENCOUNTER — Encounter (INDEPENDENT_AMBULATORY_CARE_PROVIDER_SITE_OTHER): Payer: Self-pay

## 2010-12-23 ENCOUNTER — Other Ambulatory Visit (INDEPENDENT_AMBULATORY_CARE_PROVIDER_SITE_OTHER): Payer: Self-pay | Admitting: General Surgery

## 2010-12-23 ENCOUNTER — Ambulatory Visit (INDEPENDENT_AMBULATORY_CARE_PROVIDER_SITE_OTHER): Payer: Medicare Other | Admitting: General Surgery

## 2010-12-23 ENCOUNTER — Encounter (INDEPENDENT_AMBULATORY_CARE_PROVIDER_SITE_OTHER): Payer: Self-pay | Admitting: General Surgery

## 2010-12-23 VITALS — BP 162/80 | HR 70 | Temp 97.2°F | Resp 16 | Ht 63.0 in | Wt 143.4 lb

## 2010-12-23 DIAGNOSIS — C4370 Malignant melanoma of unspecified lower limb, including hip: Secondary | ICD-10-CM

## 2010-12-23 NOTE — Progress Notes (Signed)
Subjective:     Patient ID: Ariel Holmes, female   DOB: 12/16/1940, 70 y.o.   MRN: [id number]  HPI Patient presents with a recent diagnosis of melanoma left upper inner thigh. The area first developed as a bumper. It then developed a very dark color and she went to see her dermatologist. Shave biopsy was done. 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 At that was 0.62 mm. Lateral margins were positive. The patient has no complaints at the biopsy site. She does note a small lesion near to it but she claims is what this lesion initially looked like and she has requested to have that removed at the same time.  Review of Systems  Constitutional: Negative for fever, chills and unexpected weight change.  HENT: Negative for hearing loss, congestion, sore throat, trouble swallowing and voice change.   Eyes: Negative for visual disturbance.  Respiratory: Negative for cough and wheezing.   Cardiovascular: Negative for chest pain, palpitations and leg swelling.  Gastrointestinal: Negative for nausea, vomiting, abdominal pain, diarrhea, constipation, blood in stool, abdominal distention and anal bleeding.  Genitourinary: Negative for hematuria, vaginal bleeding and difficulty urinating.  Musculoskeletal: Negative for arthralgias.  Skin: Negative for rash and wound.  Neurological: Negative for seizures, syncope and headaches.  Hematological: Negative for adenopathy. Does not bruise/bleed easily.  Psychiatric/Behavioral: Negative for confusion.  Additional skin: The lesion next to Her biopsy site as described above     Objective:   Physical Exam  Constitutional: She is oriented to person, place, and time. She appears well-developed and well-nourished.  HENT:  Head: Normocephalic and atraumatic.  Eyes: Conjunctivae and EOM are normal. Pupils are equal, round, and reactive to light.  Neck: Normal range of motion. Neck supple. No tracheal deviation present.  Cardiovascular:  Normal rate, regular rhythm and normal heart sounds.   No murmur heard. Pulmonary/Chest: Effort normal and breath sounds normal. No respiratory distress. She has no wheezes.  Abdominal: Soft. Bowel sounds are normal. She exhibits no distension. There is no tenderness. There is no rebound and no guarding.  Musculoskeletal: Normal range of motion.  Neurological: She is alert and oriented to person, place, and time.  Skin: Skin is warm and dry.  Biopsy site left upper inner thigh is healing well without signs of infection. There is a 5 mm raised skin lesion medial to the biopsy site. There is no discoloration. Lymphatic exam reveals no supraclavicular, cervical, bilateral axillary, or bilateral inguinal lymphadenopathy    Assessment:     Melanoma left upper inner thigh, Skin lesion left upper inner thigh    Plan:     I have offered a wide excision of the melanoma. Procedure, risks, and benefits were discussed with the patient in detail. We will plan this as an outpatient procedure under general anesthesia. We will also remove this other lesion which is close by. That will be sent to pathology as well. Questions were answered. I gave the patient a booklet on melanoma from the [employer]. She is agreeable. I look forward to doing this in the near future.

## 2010-12-30 ENCOUNTER — Encounter (HOSPITAL_BASED_OUTPATIENT_CLINIC_OR_DEPARTMENT_OTHER)
Admission: RE | Admit: 2010-12-30 | Discharge: 2010-12-30 | Disposition: A | Discharge status: Home / Self Care | Payer: Medicare Other | Source: Ambulatory Visit | Attending: General Surgery | Admitting: General Surgery

## 2010-12-30 ENCOUNTER — Other Ambulatory Visit: Payer: Self-pay

## 2010-12-30 ENCOUNTER — Encounter (HOSPITAL_BASED_OUTPATIENT_CLINIC_OR_DEPARTMENT_OTHER): Payer: Self-pay | Admitting: *Deleted

## 2010-12-30 NOTE — Progress Notes (Signed)
To come in for ekg- 

## 2010-12-31 ENCOUNTER — Encounter (HOSPITAL_BASED_OUTPATIENT_CLINIC_OR_DEPARTMENT_OTHER): Payer: Self-pay | Admitting: *Deleted

## 2010-12-31 ENCOUNTER — Other Ambulatory Visit (INDEPENDENT_AMBULATORY_CARE_PROVIDER_SITE_OTHER): Payer: Self-pay | Admitting: General Surgery

## 2010-12-31 ENCOUNTER — Encounter (HOSPITAL_BASED_OUTPATIENT_CLINIC_OR_DEPARTMENT_OTHER): Payer: Self-pay | Admitting: Anesthesiology

## 2010-12-31 ENCOUNTER — Ambulatory Visit (HOSPITAL_BASED_OUTPATIENT_CLINIC_OR_DEPARTMENT_OTHER)
Admission: RE | Admit: 2010-12-31 | Discharge: 2010-12-31 | Disposition: A | Discharge status: Home / Self Care | Payer: Medicare Other | Source: Ambulatory Visit | Attending: General Surgery | Admitting: General Surgery

## 2010-12-31 ENCOUNTER — Encounter (HOSPITAL_BASED_OUTPATIENT_CLINIC_OR_DEPARTMENT_OTHER)
Admission: RE | Disposition: A | Discharge status: Home / Self Care | Payer: Self-pay | Source: Ambulatory Visit | Attending: General Surgery

## 2010-12-31 ENCOUNTER — Ambulatory Visit (HOSPITAL_BASED_OUTPATIENT_CLINIC_OR_DEPARTMENT_OTHER): Payer: Medicare Other | Admitting: Anesthesiology

## 2010-12-31 DIAGNOSIS — C4370 Malignant melanoma of unspecified lower limb, including hip: Secondary | ICD-10-CM

## 2010-12-31 DIAGNOSIS — L570 Actinic keratosis: Secondary | ICD-10-CM | POA: Insufficient documentation

## 2010-12-31 DIAGNOSIS — Z01810 Encounter for preprocedural cardiovascular examination: Secondary | ICD-10-CM | POA: Insufficient documentation

## 2010-12-31 DIAGNOSIS — Z85820 Personal history of malignant melanoma of skin: Secondary | ICD-10-CM | POA: Insufficient documentation

## 2010-12-31 HISTORY — DX: Chronic obstructive pulmonary disease, unspecified: J44.9

## 2010-12-31 HISTORY — PX: MELANOMA EXCISION: SHX5266

## 2010-12-31 SURGERY — EXCISION, MELANOMA
Anesthesia: General | Site: Thigh | Laterality: Left | Wound class: Clean

## 2010-12-31 MED ORDER — LACTATED RINGERS IV SOLN: INTRAVENOUS | Status: DC

## 2010-12-31 MED ORDER — MIDAZOLAM HCL 5 MG/5ML IJ SOLN
INTRAMUSCULAR | Status: DC | PRN
  Administered 2010-12-31: 2 mg via INTRAVENOUS

## 2010-12-31 MED ORDER — LACTATED RINGERS IV SOLN
INTRAVENOUS | Status: DC | PRN
  Administered 2010-12-31 (×2): via INTRAVENOUS

## 2010-12-31 MED ORDER — PROPOFOL 10 MG/ML IV EMUL
INTRAVENOUS | Status: DC | PRN
  Administered 2010-12-31: 150 mg via INTRAVENOUS

## 2010-12-31 MED ORDER — FENTANYL CITRATE 0.05 MG/ML IJ SOLN
INTRAMUSCULAR | Status: DC | PRN
  Administered 2010-12-31: 100 ug via INTRAVENOUS

## 2010-12-31 MED ORDER — LACTATED RINGERS IV SOLN
INTRAVENOUS | Status: DC
  Administered 2010-12-31: 13:00:00 via INTRAVENOUS

## 2010-12-31 MED ORDER — ONDANSETRON HCL 4 MG/2ML IJ SOLN: 4.0000 mg | Freq: Once | INTRAMUSCULAR | Status: DC | PRN

## 2010-12-31 MED ORDER — ONDANSETRON HCL 4 MG/2ML IJ SOLN
INTRAMUSCULAR | Status: DC | PRN
  Administered 2010-12-31: 4 mg via INTRAVENOUS

## 2010-12-31 MED ORDER — BUPIVACAINE-EPINEPHRINE PF 0.5-1:200000 % IJ SOLN
INTRAMUSCULAR | Status: DC | PRN
  Administered 2010-12-31: 10 mL

## 2010-12-31 MED ORDER — CEFAZOLIN SODIUM 1-5 GM-% IV SOLN: 1.0000 g | INTRAVENOUS | Status: DC

## 2010-12-31 MED ORDER — DEXAMETHASONE SODIUM PHOSPHATE 4 MG/ML IJ SOLN
INTRAMUSCULAR | Status: DC | PRN
  Administered 2010-12-31: 10 mg via INTRAVENOUS

## 2010-12-31 MED ORDER — HYDROCODONE-ACETAMINOPHEN 5-325 MG PO TABS: 1.0000 | ORAL_TABLET | Freq: Once | ORAL | Status: DC

## 2010-12-31 MED ORDER — LIDOCAINE HCL (CARDIAC) 20 MG/ML IV SOLN
INTRAVENOUS | Status: DC | PRN
  Administered 2010-12-31: 50 mg via INTRAVENOUS

## 2010-12-31 MED ORDER — HYDROCODONE-ACETAMINOPHEN 5-325 MG PO TABS: 1.0000 | ORAL_TABLET | Freq: Four times a day (QID) | ORAL | Status: AC | PRN

## 2010-12-31 MED ORDER — HYDROMORPHONE HCL PF 1 MG/ML IJ SOLN: 0.2500 mg | INTRAMUSCULAR | Status: DC | PRN

## 2010-12-31 SURGICAL SUPPLY — 57 items
BENZOIN TINCTURE PRP APPL 2/3 (GAUZE/BANDAGES/DRESSINGS) IMPLANT
BLADE HEX COATED 2.75 (ELECTRODE) ×2 IMPLANT
BLADE SURG 10 STRL SS (BLADE) ×2 IMPLANT
BLADE SURG 15 STRL LF DISP TIS (BLADE) ×1 IMPLANT
BLADE SURG 15 STRL SS (BLADE) ×1
BLADE SURG ROTATE 9660 (MISCELLANEOUS) IMPLANT
CANISTER SUCTION 1200CC (MISCELLANEOUS) IMPLANT
CHLORAPREP W/TINT 26ML (MISCELLANEOUS) ×2 IMPLANT
CLOTH BEACON ORANGE TIMEOUT ST (SAFETY) ×2 IMPLANT
COVER MAYO STAND STRL (DRAPES) ×2 IMPLANT
COVER TABLE BACK 60X90 (DRAPES) ×2 IMPLANT
DECANTER SPIKE VIAL GLASS SM (MISCELLANEOUS) ×2 IMPLANT
DERMABOND ADVANCED (GAUZE/BANDAGES/DRESSINGS)
DERMABOND ADVANCED .7 DNX12 (GAUZE/BANDAGES/DRESSINGS) IMPLANT
DRAPE PED LAPAROTOMY (DRAPES) ×2 IMPLANT
DRAPE U-SHAPE 76X120 STRL (DRAPES) IMPLANT
DRAPE UTILITY XL STRL (DRAPES) ×2 IMPLANT
ELECT REM PT RETURN 9FT ADLT (ELECTROSURGICAL) ×2
ELECTRODE REM PT RTRN 9FT ADLT (ELECTROSURGICAL) ×1 IMPLANT
GAUZE SPONGE 4X4 12PLY STRL LF (GAUZE/BANDAGES/DRESSINGS) IMPLANT
GLOVE BIO SURGEON STRL SZ8 (GLOVE) ×4 IMPLANT
GLOVE BIOGEL PI IND STRL 8 (GLOVE) ×1 IMPLANT
GLOVE BIOGEL PI INDICATOR 8 (GLOVE) ×1
GLOVE ECLIPSE 6.5 STRL STRAW (GLOVE) ×2 IMPLANT
GLOVE ECLIPSE 8.5 STRL (GLOVE) ×2 IMPLANT
GOWN PREVENTION PLUS XLARGE (GOWN DISPOSABLE) ×2 IMPLANT
GOWN PREVENTION PLUS XXLARGE (GOWN DISPOSABLE) ×2 IMPLANT
NEEDLE 27GAX1X1/2 (NEEDLE) IMPLANT
NEEDLE HYPO 25X1 1.5 SAFETY (NEEDLE) ×4 IMPLANT
NS IRRIG 1000ML POUR BTL (IV SOLUTION) IMPLANT
PACK BASIN DAY SURGERY FS (CUSTOM PROCEDURE TRAY) ×2 IMPLANT
PENCIL BUTTON HOLSTER BLD 10FT (ELECTRODE) ×2 IMPLANT
SHEET MEDIUM DRAPE 40X70 STRL (DRAPES) IMPLANT
SLEEVE SCD COMPRESS KNEE MED (MISCELLANEOUS) IMPLANT
SPONGE GAUZE 4X4 12PLY (GAUZE/BANDAGES/DRESSINGS) ×2 IMPLANT
STRIP CLOSURE SKIN 1/2X4 (GAUZE/BANDAGES/DRESSINGS) IMPLANT
SUT ETHILON 3 0 PS 1 (SUTURE) ×4 IMPLANT
SUT ETHILON 5 0 PS 2 18 (SUTURE) IMPLANT
SUT MON AB 4-0 PC3 18 (SUTURE) IMPLANT
SUT VIC AB 2-0 SH 27 (SUTURE) ×1
SUT VIC AB 2-0 SH 27XBRD (SUTURE) ×1 IMPLANT
SUT VIC AB 3-0 PS1 18 (SUTURE)
SUT VIC AB 3-0 PS1 18XBRD (SUTURE) IMPLANT
SUT VIC AB 4-0 P-3 18XBRD (SUTURE) ×1 IMPLANT
SUT VIC AB 4-0 P3 18 (SUTURE) ×1
SUT VIC AB 4-0 SH 18 (SUTURE) IMPLANT
SUT VIC AB 4-0 SH 27 (SUTURE)
SUT VIC AB 4-0 SH 27XANBCTRL (SUTURE) IMPLANT
SUT VIC AB 5-0 PS2 18 (SUTURE) IMPLANT
SYR BULB 3OZ (MISCELLANEOUS) IMPLANT
SYR CONTROL 10ML LL (SYRINGE) ×2 IMPLANT
TAPE CLOTH SURG 4X10 WHT LF (GAUZE/BANDAGES/DRESSINGS) ×2 IMPLANT
TOWEL OR 17X24 6PK STRL BLUE (TOWEL DISPOSABLE) ×2 IMPLANT
TOWEL OR NON WOVEN STRL DISP B (DISPOSABLE) ×2 IMPLANT
TUBE CONNECTING 20X1/4 (TUBING) IMPLANT
WATER STERILE IRR 1000ML POUR (IV SOLUTION) IMPLANT
YANKAUER SUCT BULB TIP NO VENT (SUCTIONS) IMPLANT

## 2010-12-31 NOTE — Anesthesia Procedure Notes (Addendum)
Procedure Name: LMA Insertion Date/Time: 12/31/2010 1:46 PM Performed by: Zenia Resides D Pre-anesthesia Checklist: Patient identified, Emergency Drugs available, Suction available and Patient being monitored Patient Re-evaluated:Patient Re-evaluated prior to inductionOxygen Delivery Method: Circle System Utilized Preoxygenation: Pre-oxygenation with 100% oxygen Intubation Type: IV induction Ventilation: Mask ventilation without difficulty LMA: LMA with gastric port inserted LMA Size: 4.0 Number of attempts: 1 Placement Confirmation: positive ETCO2 and breath sounds checked- equal and bilateral Tube secured with: Tape Dental Injury: Teeth and Oropharynx as per pre-operative assessment

## 2010-12-31 NOTE — H&P (View-Only) (Signed)
Subjective:     Patient ID: Ariel Holmes, female   DOB: 01/17/1941, 70 y.o.   MRN: [id number]  HPI Patient presents with a recent diagnosis of melanoma left upper inner thigh. The area first developed as a bumper. It then developed a very dark color and she went to see her dermatologist. Shave biopsy was done. 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 At that was 0.62 mm. Lateral margins were positive. The patient has no complaints at the biopsy site. She does note a small lesion near to it but she claims is what this lesion initially looked like and she has requested to have that removed at the same time.  Review of Systems  Constitutional: Negative for fever, chills and unexpected weight change.  HENT: Negative for hearing loss, congestion, sore throat, trouble swallowing and voice change.   Eyes: Negative for visual disturbance.  Respiratory: Negative for cough and wheezing.   Cardiovascular: Negative for chest pain, palpitations and leg swelling.  Gastrointestinal: Negative for nausea, vomiting, abdominal pain, diarrhea, constipation, blood in stool, abdominal distention and anal bleeding.  Genitourinary: Negative for hematuria, vaginal bleeding and difficulty urinating.  Musculoskeletal: Negative for arthralgias.  Skin: Negative for rash and wound.  Neurological: Negative for seizures, syncope and headaches.  Hematological: Negative for adenopathy. Does not bruise/bleed easily.  Psychiatric/Behavioral: Negative for confusion.  Additional skin: The lesion next to Her biopsy site as described above     Objective:   Physical Exam  Constitutional: She is oriented to person, place, and time. She appears well-developed and well-nourished.  HENT:  Head: Normocephalic and atraumatic.  Eyes: Conjunctivae and EOM are normal. Pupils are equal, round, and reactive to light.  Neck: Normal range of motion. Neck supple. No tracheal deviation present.  Cardiovascular:  Normal rate, regular rhythm and normal heart sounds.   No murmur heard. Pulmonary/Chest: Effort normal and breath sounds normal. No respiratory distress. She has no wheezes.  Abdominal: Soft. Bowel sounds are normal. She exhibits no distension. There is no tenderness. There is no rebound and no guarding.  Musculoskeletal: Normal range of motion.  Neurological: She is alert and oriented to person, place, and time.  Skin: Skin is warm and dry.  Biopsy site left upper inner thigh is healing well without signs of infection. There is a 5 mm raised skin lesion medial to the biopsy site. There is no discoloration. Lymphatic exam reveals no supraclavicular, cervical, bilateral axillary, or bilateral inguinal lymphadenopathy    Assessment:     Melanoma left upper inner thigh, Skin lesion left upper inner thigh    Plan:     I have offered a wide excision of the melanoma. Procedure, risks, and benefits were discussed with the patient in detail. We will plan this as an outpatient procedure under general anesthesia. We will also remove this other lesion which is close by. That will be sent to pathology as well. Questions were answered. I gave the patient a booklet on melanoma from the [employer]. She is agreeable. I look forward to doing this in the near future.      

## 2010-12-31 NOTE — Anesthesia Preprocedure Evaluation (Addendum)
Anesthesia Evaluation  Patient identified by MRN, date of birth, ID band Patient awake    Reviewed: Allergy & Precautions, H&P   Airway Mallampati: I TM Distance: >3 FB Neck ROM: Full    Dental  (+) Teeth Intact   Pulmonary  clear to auscultation        Cardiovascular Regular Normal    Neuro/Psych    GI/Hepatic PUD,   Endo/Other    Renal/GU      Musculoskeletal   Abdominal   Peds  Hematology   Anesthesia Other Findings   Reproductive/Obstetrics                           Anesthesia Physical Anesthesia Plan  ASA: II  Anesthesia Plan: General   Post-op Pain Management:    Induction: Intravenous  Airway Management Planned: LMA  Additional Equipment:   Intra-op Plan:   Post-operative Plan:   Informed Consent: I have reviewed the patients History and Physical, chart, labs and discussed the procedure including the risks, benefits and alternatives for the proposed anesthesia with the patient or authorized representative who has indicated his/her understanding and acceptance.   Dental advisory given  Plan Discussed with:   Anesthesia Plan Comments:         Anesthesia Quick Evaluation

## 2010-12-31 NOTE — Op Note (Signed)
12/31/2010  2:25 PM  PATIENT:  Ariel Holmes  70 y.o. 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 We are also removing a small skin lesion left thigh. The patient was identified in the preop holding area. She received intravenous antibiotics. Her site was marked. She was brought to the operating room and general anesthesia with laryngeal mask airway was administered by the anesthesia staff. Her left thigh was prepped and draped in sterile fashion. Attention was first directed to the small mole like lesion proximal to the melanoma site. Half percent Marcaine with epinephrine was injected as local anesthetic. An elliptical incision was made to fully remove the small lesion. This was sent for pathology. The wound was closed with subcuticular 4-0 Vicryl stitch. Next a large elliptical incision was measured out around the melanoma site. The skin greater than 1 cm circumferential margin. The ellipse was 9 x 4 cm. Additional local anesthetic was injected. Elliptical incision was made. Subcutaneous tissues were dissected down to the underlying fascia. Elliptical excision was excised completely. It was marked for anatomic orientation for pathology. Wound was copiously irrigated. Meticulous hemostasis was obtained. The wound was closed with in layers with deep tissues approximated with interrupted 2-0 Vicryl suture. The skin was closed with interrupted 3-0  nylon sutures. Sponge needle and instrument count correct. Patient tolerated procedure well without apparent complications and was taken to recovery in stable condition.  PATIENT DISPOSITION:  PACU - hemodynamically stable.   Delay start of Pharmacological VTE agent (>24hrs) due to surgical blood loss or risk of bleeding:  no  Shandell Jallow E 11/15/20122:25 PM

## 2010-12-31 NOTE — Transfer of Care (Signed)
Immediate Anesthesia Transfer of Care Note  Patient: Ariel Holmes  Procedure(s) Performed:  MELANOMA EXCISION - wide excision melanoma left thigh, excision lesion left thigh  Patient Location: PACU  Anesthesia Type: General  Level of Consciousness: awake, alert  and oriented  Airway & Oxygen Therapy: Patient Spontanous Breathing and Patient connected to face mask oxygen  Post-op Assessment: Report given to PACU RN  Post vital signs: stable  Complications: No apparent anesthesia complications

## 2010-12-31 NOTE — Anesthesia Postprocedure Evaluation (Signed)
  Anesthesia Post-op Note  Patient: Ariel Holmes  Procedure(s) Performed:  MELANOMA EXCISION - wide excision melanoma left thigh, excision lesion left thigh  Patient Location: PACU  Anesthesia Type: General  Level of Consciousness: awake, alert  and oriented  Airway and Oxygen Therapy: Patient Spontanous Breathing  Post-op Pain: none  Post-op Assessment: Post-op Vital signs reviewed and Patient's Cardiovascular Status Stable  Post-op Vital Signs: stable  Complications: No apparent anesthesia complications

## 2010-12-31 NOTE — Interval H&P Note (Signed)
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 After consideration of risks, benefits and other options for treatment, the patient has consented to  Procedure(s): MELANOMA EXCISION as a surgical intervention .  The patients' history has been reviewed, patient re-examined, no change in status, stable for surgery.  I have reviewed the patients' chart and labs.  Questions were answered to the patient's satisfaction.     Liz Malady  MD

## 2011-01-04 ENCOUNTER — Telehealth (INDEPENDENT_AMBULATORY_CARE_PROVIDER_SITE_OTHER): Payer: Self-pay

## 2011-01-04 NOTE — Telephone Encounter (Signed)
I called the patient and notified her of the pathology report being benign with clear margins.  She stated she had a little bit of clear seepage from the incision and i told her this is ok as long as doesn't look infected and drain pus.  She said it does not.  She has been getting in the shower.  She will see [place] in follow up on 11/28

## 2011-01-05 ENCOUNTER — Encounter (HOSPITAL_BASED_OUTPATIENT_CLINIC_OR_DEPARTMENT_OTHER): Payer: Self-pay | Admitting: General Surgery

## 2011-01-13 ENCOUNTER — Encounter (INDEPENDENT_AMBULATORY_CARE_PROVIDER_SITE_OTHER): Payer: Self-pay | Admitting: General Surgery

## 2011-01-13 ENCOUNTER — Ambulatory Visit (INDEPENDENT_AMBULATORY_CARE_PROVIDER_SITE_OTHER): Payer: Medicare Other | Admitting: General Surgery

## 2011-01-13 VITALS — HR 80 | Temp 97.0°F | Resp 18 | Ht 63.0 in | Wt 142.1 lb

## 2011-01-13 DIAGNOSIS — C4370 Malignant melanoma of unspecified lower limb, including hip: Secondary | ICD-10-CM

## 2011-01-13 NOTE — Progress Notes (Signed)
Subjective:     Patient ID: Ariel Holmes, female   DOB: February 19, 1940, 70 y.o.   MRN: [id number]  HPI Patient is status post wide excision of melanoma left upper thigh and left upper thigh skin biopsy on December 23, 2010. She has sutures in place. She is not taking any pain medication.  Review of Systems     Objective:   Physical Exam Incision is well healed. Sutures were removed. Steri-Strips were placed. There is no evidence of infection.    Assessment:     Doing very well status post wide excision melanoma left upper thigh and left upper thigh skin biopsy    Plan:  Patient was given wound care instructions. We will make a referral to oncology. Patient will continue to followup with her dermatologist Dr. Londell Moh every 3 months. I will see her back in 3 months.

## 2011-02-01 ENCOUNTER — Telehealth (INDEPENDENT_AMBULATORY_CARE_PROVIDER_SITE_OTHER): Payer: Self-pay | Admitting: General Surgery

## 2011-02-01 NOTE — Telephone Encounter (Signed)
Was in here for malenoma of the leg, was referred to oncologist, has not heard from them at all for an appointment, is this normal.  Would also like to speak to you about her sx, she still has a little discomfort, just wants to know if it is normal, not urgent, please call.

## 2011-02-04 ENCOUNTER — Telehealth (INDEPENDENT_AMBULATORY_CARE_PROVIDER_SITE_OTHER): Payer: Self-pay

## 2011-02-04 NOTE — Telephone Encounter (Signed)
Pt called today stating she has been waiting to hear from the [facility] about a referral to Dr. Myna Hidalgo.  Pt says she has called and left three messages.  The referral was placed on 01/13/11.  Please call this patient and/or the Cancer Center to get an appointment.

## 2011-02-05 ENCOUNTER — Telehealth (INDEPENDENT_AMBULATORY_CARE_PROVIDER_SITE_OTHER): Payer: Self-pay | Admitting: General Surgery

## 2011-02-05 ENCOUNTER — Telehealth: Payer: Self-pay | Admitting: *Deleted

## 2011-02-05 NOTE — Telephone Encounter (Signed)
Contacted the patient to advise her that I spoke with Raiford Noble at Dr Tama Gander office and he is not showing a referral for the patient. Faxed copy of referral to Rick/Dr Ennever [phone]. Pt given Dr Tama Gander number to follow up regarding her appointment

## 2011-02-05 NOTE — Telephone Encounter (Signed)
Left note with Tresa Endo to please get date for appointment from Dr. Myna Hidalgo and call pt.

## 2011-02-12 ENCOUNTER — Telehealth: Payer: Self-pay | Admitting: Hematology & Oncology

## 2011-02-12 NOTE — Telephone Encounter (Signed)
Opened in error

## 2011-02-15 ENCOUNTER — Ambulatory Visit (HOSPITAL_BASED_OUTPATIENT_CLINIC_OR_DEPARTMENT_OTHER): Payer: Medicare Other | Admitting: Hematology & Oncology

## 2011-02-15 ENCOUNTER — Encounter: Payer: Self-pay | Admitting: Hematology & Oncology

## 2011-02-15 VITALS — BP 139/68 | HR 61 | Temp 97.5°F | Ht 63.0 in | Wt 134.0 lb

## 2011-02-15 DIAGNOSIS — C439 Malignant melanoma of skin, unspecified: Secondary | ICD-10-CM

## 2011-02-15 DIAGNOSIS — C4370 Malignant melanoma of unspecified lower limb, including hip: Secondary | ICD-10-CM

## 2011-02-15 HISTORY — DX: Malignant melanoma of unspecified lower limb, including hip: C43.70

## 2011-02-15 NOTE — Progress Notes (Signed)
CC:   Lonzo Cloud. Kriste Basque, MD Norval Gable. [place], M.D. Gabrielle Dare Janee Morn, M.D.  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  HISTORY OF PRESENT ILLNESS:  Ms. Rainford is a very nice 70 year old white female.  She is followed Dr. Alroy Dust.  She has been fairly healthy.  She does have a history of tobacco use.  She noted an area on her upper inner thigh.  This began to turn color.  She subsequently was referred to Dr. Leta Speller.  Dr. Londell Moh went ahead and did a biopsy.  This was done on 12/11/2010.  The pathology report ([id number]) showed a lentigo maligna melanoma with spitzoid features.  It was 0.62 mm in Breslow depth.  It was Clark level 2 invasion.  She had excellent prognostic features with very little mitoses.  She had no evidence of regression, ulceration, or vascular invasion.  There was no satellite ptosis.  She then underwent a wide local excision by Dr. Janee Morn.  Dr. Janee Morn did this on 12/31/2010.  The pathology report ([id number]) did not show any evidence of residual melanoma.  All margins were negative.  As such, she is a stage IA (T1a, NX, MX).  She was kindly referred to the [facility] for evaluation.  She has really had no problems with respect to recovery from her surgery.  She has had no leg swelling.  She has had no fever.  There has been no bleeding.  She said that at 1 time, she did find a little blood on the dressing.  She has had no abdominal pain.  She has had no fevers, sweats or chills.  She has had no change in bowel or bladder habits. There has been no cough.  She does smoke.  PAST MEDICAL HISTORY: 1. Asthmatic bronchitis. 2. Mitral valve prolapse. 3. Hyperlipidemia. 4. Hypothyroidism. 5. GERD. 6. Osteopenia. 7. Anxiety.  ALLERGIES:  CODEINE.  MEDICATIONS: 1. Flonase nasal spray 2 sprays in each nostril b.i.d. 2. Aspirin 81 mg p.o. daily. 3. Synthroid 0.1 mg p.o. daily. 4. Tagamet  300 mg p.o. daily. 5. Vitamin D 2000 units p.o. daily. 6. Lutein 20 mg p.o. daily.  SOCIAL HISTORY:  Remarkable for tobacco use.  She probably has about a 30-pack-year history of tobacco use.  She says she stopped smoking in June.  There is rare alcohol use.  FAMILY HISTORY:  Unremarkable for any history of skin cancer.  REVIEW OF SYSTEMS:  As stated in the history of present illness.  No additional findings are noted on a 12-system review.  PHYSICAL EXAM:  General:  This is a well-developed, well-nourished white female in no obvious distress.  Vital Signs:  Temperature of 97.5, pulse 61, respiratory rate 18, blood pressure 139/68.  Weight is 134. Head/Neck:  Exam shows a normocephalic, atraumatic skull.  There are no ocular or oral lesions.  There are no palpable cervical or supraclavicular lymph nodes.  Lungs:  Clear to percussion and auscultation bilaterally.  Cardiac:  Regular rate and rhythm with a normal S1, S2.  There are no murmurs, rubs, or bruits.  Abdomen:  Soft with good bowel sounds.  There is no palpable abdominal mass.  There is no fluid wave.  There is no palpable hepatosplenomegaly.  There is no inguinal adenopathy bilaterally.  Back:  No tenderness over the spine, ribs, or hips.  Axillae:  Exam shows no bilateral axillary adenopathy. Extremities:  Shows a healing wide local excision scar in the upper  inner left thigh.  There is minimal erythema associated with this. There is some slight firmness associated with this.  No bleeding or tenderness is noted over the incision.  She has good range of motion of her extremities.  There is no lymphedema in the left leg.  She has good pulses in her distal extremities.  Skin:  Exam shows some seborrheic keratoses.  She has a few actinic keratoses.  I do not see anything that appears suspicious.  Neurologic:  Exam shows no focal neurological deficits.  LABORATORIES:  Labs were not done this visit.  IMPRESSION:  Ms. Acheampong is  a very charming 70 year old white female with stage IA (T1a NX MX) lentigo maligna melanoma of the upper left thigh. This should have a very good prognosis.  According to the melanoma prognostic program, her 10-year risk of recurrence is about 7%.  I do not see any evidence or any indication for any further therapy.  I do not see that a sentinel node biopsy would be of any benefit.  Again, I would believe that the sentinel node would have a very, very low risk of harboring microscopic melanoma.  There is no indication for routine scans.  I do believe that some routine lab work would be appropriate for her.  I think that seeing her every 3 months for the 1st year and every 4 months the second year, then every 6 months after that would be appropriate.  I would only order scans on her if she had developed unusual symptoms or if she had abnormalities in her lab work.  To me, I think her biggest risk is smoking.  She says she stopped in June, but it sounds like she still may have some occasional tobacco use.  Ms. Leth is very nice.  She is a retired [employer] loss adjuster.  Her husband used to work for [employer].  They are certainly very talkative, and I enjoyed seeing them and talking with him.  I answered all their questions.  We will plan to get her back to see [place] in another 3 months with lab work.  I spent a good hour with them today.    ______________________________ [name], M.D. PRE/MEDQ  D:  02/15/2011  T:  02/15/2011  Job:  [id number]

## 2011-02-15 NOTE — Progress Notes (Signed)
This office note has been dictated.

## 2011-02-17 ENCOUNTER — Telehealth: Payer: Self-pay | Admitting: *Deleted

## 2011-02-17 NOTE — Telephone Encounter (Signed)
Mailed 04-2011 schedule °

## 2011-03-05 ENCOUNTER — Ambulatory Visit: Payer: Medicare Other | Admitting: Hematology & Oncology

## 2011-03-05 ENCOUNTER — Other Ambulatory Visit: Payer: Medicare Other | Admitting: Lab

## 2011-03-05 ENCOUNTER — Ambulatory Visit: Payer: Medicare Other

## 2011-03-18 ENCOUNTER — Other Ambulatory Visit: Payer: Self-pay | Admitting: Allergy

## 2011-03-18 MED ORDER — LEVOTHYROXINE SODIUM 100 MCG PO CAPS: 100.0000 ug | ORAL_CAPSULE | Freq: Every day | ORAL | Status: DC

## 2011-03-18 NOTE — Telephone Encounter (Signed)
[facility] REQUESTING RX PT HAS APPT 04/3011

## 2011-04-14 ENCOUNTER — Encounter (INDEPENDENT_AMBULATORY_CARE_PROVIDER_SITE_OTHER): Payer: Self-pay | Admitting: General Surgery

## 2011-04-14 ENCOUNTER — Ambulatory Visit (INDEPENDENT_AMBULATORY_CARE_PROVIDER_SITE_OTHER): Payer: Medicare Other | Admitting: General Surgery

## 2011-04-14 VITALS — BP 150/80 | HR 70 | Temp 97.6°F | Resp 18 | Ht 62.5 in | Wt 143.0 lb

## 2011-04-14 DIAGNOSIS — C4370 Malignant melanoma of unspecified lower limb, including hip: Secondary | ICD-10-CM

## 2011-04-14 NOTE — Progress Notes (Signed)
Subjective:     Patient ID: Ariel Holmes, female   DOB: 1940-10-04, 71 y.o.   MRN: [id number]  HPI Patient wide excision of melanoma left thigh in November of 2012. She's been doing well. She was seen by Dr. Myna Hidalgo with oncology. No further treatment is planned. He is continuing to follow her. She has noticed some thickening of the tissue of her scar.  Review of Systems     Objective:   Physical Exam No axillary or inguinal adenopathy Scar incision left inner thigh is well healed. The medial portion of it has some thickening mostly consistent with scar tissue. There is no hard nodularity.    Assessment:     doing well status post wide excision melanoma left inner thigh   Plan:     I would see her back in 2 months to double check this area of scar tissue.She continues follow up with oncology and dermatology

## 2011-04-28 ENCOUNTER — Ambulatory Visit (INDEPENDENT_AMBULATORY_CARE_PROVIDER_SITE_OTHER)
Admission: RE | Admit: 2011-04-28 | Discharge: 2011-04-28 | Disposition: A | Discharge status: Home / Self Care | Payer: Medicare Other | Source: Ambulatory Visit | Attending: Pulmonary Disease | Admitting: Pulmonary Disease

## 2011-04-28 ENCOUNTER — Encounter: Payer: Self-pay | Admitting: Pulmonary Disease

## 2011-04-28 ENCOUNTER — Ambulatory Visit (INDEPENDENT_AMBULATORY_CARE_PROVIDER_SITE_OTHER): Payer: Medicare Other | Admitting: Pulmonary Disease

## 2011-04-28 ENCOUNTER — Other Ambulatory Visit (INDEPENDENT_AMBULATORY_CARE_PROVIDER_SITE_OTHER): Payer: Medicare Other

## 2011-04-28 VITALS — BP 130/70 | HR 56 | Temp 97.0°F | Ht 62.5 in | Wt 143.0 lb

## 2011-04-28 DIAGNOSIS — E039 Hypothyroidism, unspecified: Secondary | ICD-10-CM

## 2011-04-28 DIAGNOSIS — E7800 Pure hypercholesterolemia, unspecified: Secondary | ICD-10-CM

## 2011-04-28 DIAGNOSIS — M899 Disorder of bone, unspecified: Secondary | ICD-10-CM

## 2011-04-28 DIAGNOSIS — Z8679 Personal history of other diseases of the circulatory system: Secondary | ICD-10-CM

## 2011-04-28 DIAGNOSIS — D126 Benign neoplasm of colon, unspecified: Secondary | ICD-10-CM

## 2011-04-28 DIAGNOSIS — C4370 Malignant melanoma of unspecified lower limb, including hip: Secondary | ICD-10-CM

## 2011-04-28 DIAGNOSIS — J209 Acute bronchitis, unspecified: Secondary | ICD-10-CM

## 2011-04-28 DIAGNOSIS — F411 Generalized anxiety disorder: Secondary | ICD-10-CM

## 2011-04-28 DIAGNOSIS — I868 Varicose veins of other specified sites: Secondary | ICD-10-CM

## 2011-04-28 DIAGNOSIS — K219 Gastro-esophageal reflux disease without esophagitis: Secondary | ICD-10-CM

## 2011-04-28 LAB — CBC WITH DIFFERENTIAL/PLATELET
Basophils Absolute: 0.1 10*3/uL (ref 0.0–0.1)
Basophils Relative: 0.7 % (ref 0.0–3.0)
Eosinophils Relative: 0.7 % (ref 0.0–5.0)
Hemoglobin: 13.3 g/dL (ref 12.0–15.0)
Lymphocytes Relative: 52.6 % — ABNORMAL HIGH (ref 12.0–46.0)
Monocytes Relative: 10.7 % (ref 3.0–12.0)
Neutro Abs: 3.6 10*3/uL (ref 1.4–7.7)
RBC: 4.26 Mil/uL (ref 3.87–5.11)
WBC: 10.3 10*3/uL (ref 4.5–10.5)

## 2011-04-28 LAB — LIPID PANEL
HDL: 85.6 mg/dL (ref >39.00)
Total CHOL/HDL Ratio: 2
Triglycerides: 77 mg/dL (ref 0.0–149.0)

## 2011-04-28 LAB — BASIC METABOLIC PANEL
Calcium: 9.8 mg/dL (ref 8.4–10.5)
GFR: 106.91 mL/min (ref >60.00)
Potassium: 5 mEq/L (ref 3.5–5.1)
Sodium: 140 mEq/L (ref 135–145)

## 2011-04-28 MED ORDER — FLUTICASONE PROPIONATE 50 MCG/ACT NA SUSP: 2.0000 | NASAL | Status: DC | PRN

## 2011-04-28 MED ORDER — LEVOTHYROXINE SODIUM 100 MCG PO CAPS: 100.0000 ug | ORAL_CAPSULE | Freq: Every day | ORAL | Status: DC

## 2011-04-28 NOTE — Patient Instructions (Signed)
Today we updated your med list in our EPIC system...    Continue your current medications the same...    We refilled your meds per request...  Today we did your follow up CXR & fasting blood work...    Please call the [facility]REE in a few days for your results...    Dial [id number] & when prompted enter your patient number followed by the # symbol...    Your patient number is:  [id number]  Call for any questions or if we can be of service in any way.Marland KitchenMarland Kitchen

## 2011-04-28 NOTE — Progress Notes (Signed)
Subjective:     Patient ID: Ariel Holmes, female   DOB: 1940-04-24, 70 y.o.   MRN: [id number]  HPI 71 y/o WF here for a follow up visit... she has multiple medical problems as noted below...    ~  March 11, 2009:  she's had a good yr- no new complaints or concerns... unfortunately she continues to smoke 1/2 ppd- denies smoker's cough, phlegm, SOB/ DOE, etc... she is not interested in smoking cessation help> we discussed working w/ her husb to help him lose wt & her to quit smoking... she does note some dysuria, & right ear pain> check UA & Rx w/ Cortisporin Otic... she uses Cimetadine 300mg  Prn for "upset stomach" & requests refill... she had colonoscopy 5/10  DrBrodie w/ mild divertics & cecal adenoma removed...   ~  March 11, 2010:  she tripped 6/11 w/ fx 5th metatarsal req cast, boot, etc per DrRendall- now resolved... still smoking but down less than 1/2 ppd she says & denies smokers cough, sputum, SOB/ DOE, etc> denies CP, palpit, or cardiac problems- exerc by walking some she says... she reports prolapsed bladder per GYN... CXR- NAD, & Labs- stable w/ hi HDL.Marland KitchenMarland Kitchen OK Pneumovax & TDAP today.  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 She quit smoking 6/12 after making a contract w/ her uncle for $1000 per month for 79mo of not smoking!!! She is still chewing nicorette gum... She has been trying to control Chol on diet alone as she states "I can't take the --TORS"; she would be willing to try Zetia which her mother takes along w/ CoQ10... Other medical problems as listed below> her only prescription meds as Flonase & Levothyroid 123mcg/d... CXR 3/13 showed heart is wnl, lungs clear x sl incr markings, NAD.[name] LABS 3/13:  FLP- Improved on diet;  Chems-  wnl;  CBC- wnl;  TSH=0.71 on Levothy100/d...   PROBLEM LIST:    ALLERGIC RHINITIS - uses FLONASE Prn...  ASTHMATIC BRONCHITIS, ACUTE (ICD-466.0) - no recent bronchitic exac... not on regular meds. ~  CXR 12/09 showed hyperinflation & incr markings, spondylosis in TSpine... 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 ~  CXR 3/13 showed heart size is wnl, lungs clear x sl incr markings, NAD.[name]  CIGARETTE SMOKER (ICD-305.1) >> states she quit smoking 6/12 on a bet from her uncle for $1000/mo for 79mo of smoking cessation...  MITRAL VALVE PROLAPSE, HX OF (ICD-V12.50) - on ASA 81mg /d... hx mild MVP on 2DEcho in 1995 (no signif MR)... asymptomatic without CP or palpit...  VARICOSE VEIN (ICD-456.8) - hx mild VV and ven insuffic w/ intermittent edema... she knows to avoid sodium, elevate legs, wear support hose, etc...  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 Keep up the good work.  HYPOTHYROIDISM (ICD-244.9) - on LEVOTHYROID 172mcg/d... ~  labs  12/09 showed TSH= 0.44 ~  labs 1/11 showed TSH= 0.66 ~  labs 1/12 showed TSH= 0.85 ~  Labs 3/13 on Levo100 showed TSH= 0.71  ESOPHAGEAL REFLUX (ICD-530.81) - she uses Cimetadine 300mg  Prn for "Stomach" & requests refill from [place]...  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 ~  colonoscopy 4/00 by DrDBrodie showed several 6-36mm polyps= adenomatous... ~  colonoscopy 4/05 by DrDBrodie showed only melanosis and hems... ~  colonoscopy 5/10 DrDBrodie showed mild divertics, 2 cecal polyps- tubular adenoma, f/u 52yrs.  OSTEOPENIA (ICD-733.90) - on calcium, MVI, Vit D 2000 u daily... she stopped her prev Fosamax rx on her  own in 2011. ~  BMD here 8/07 showed TScores -1.2 in Spine, and -1.8 in left FemNeck. ~  labs 8/08 showed Vit D level= 38 ~  BMD here 9/09 showed TScores -0.8 in Spine, and -1.5 in left FemNeck. ~  labs 1/11 showed Vit D level = 51  ANXIETY (ICD-300.00) - not currently on meds.  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 ~  She reports on-going follow up from [facility] & DrEnnever ?every 101mo?  Health Maintenance: ~  neg Mamogram at [facility]- followed yearly... due for GYN check by [facility] & she will call. ~  Immunizations:  she gets the yearly seasonal Flu vaccines... she received Shingles vaccine 2/09 at [facility]... had PNEUMOVAX in 2002 & 1/12 (age 56)... given TDAP 1/12.   Past Surgical History  Procedure Date  . Breast surgery     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 Patient does not remember exact date of surgery.  . Tonsillectomy   . Colonoscopy   . Cesarean section 1965  . Melanoma excision 12/31/2010    Procedure: MELANOMA EXCISION;  Surgeon: Liz Malady, MD;  Location: [facility];  Service: General;  Laterality: Left;  wide excision melanoma left thigh, excision lesion left thigh    Outpatient Encounter Prescriptions as of 04/28/2011  Medication Sig Dispense Refill  . aspirin 81 MG tablet Take 81 mg by mouth daily.        . Calcium Carbonate-Vitamin D (CALCIUM + D PO) Take by mouth daily.        . fluticasone (FLONASE) 50 MCG/ACT nasal spray Place 2 sprays into the nose as needed.        . Levothyroxine Sodium 100 MCG CAPS Take 1 capsule (100 mcg total) by mouth daily.  30 capsule  3  . LUTEIN PO Take 1 tablet by mouth daily.      . Multiple Vitamin (MULTIVITAMIN) tablet Take 1 tablet by mouth daily.        . nicotine polacrilex (NICORETTE) 2 MG gum Take 2 mg by mouth as needed.           Allergies  Allergen Reactions  . Codeine Nausea And Vomiting    Current Medications, Allergies, Past Medical History, Past Surgical History, Family History, and Social History were reviewed in [employer] record.   Review of Systems         The patient complains of fatigue, dyspnea on exertion, cough, gas/bloating, incontinence, joint pain, arthritis, and anxiety.  The patient denies fever, chills, sweats, anorexia, weakness, malaise, weight loss, sleep disorder, blurring, diplopia, eye irritation, eye discharge, vision loss, eye pain, photophobia, earache, ear discharge, tinnitus,  decreased hearing, nasal congestion, nosebleeds, sore throat, hoarseness, chest pain, palpitations, syncope, orthopnea, PND, peripheral edema, dyspnea at rest, excessive sputum, hemoptysis, wheezing, pleurisy, nausea, vomiting, diarrhea, constipation, change in bowel habits, abdominal pain, melena, hematochezia, jaundice, indigestion/heartburn, dysphagia, odynophagia, dysuria, hematuria, urinary frequency, urinary hesitancy, nocturia, back pain, joint swelling, muscle cramps, muscle weakness, stiffness, sciatica, restless legs, leg pain at night, leg pain with exertion, rash, itching, dryness, suspicious lesions, paralysis, paresthesias, seizures, tremors, vertigo, transient blindness, frequent falls, frequent headaches, difficulty walking, depression, memory loss, confusion, cold intolerance, heat intolerance, polydipsia, polyphagia, polyuria, unusual weight change, abnormal bruising, bleeding, enlarged lymph nodes, urticaria, allergic rash, hay fever, and recurrent infections.     Objective:   Physical Exam     WD, WN, 71 y/o WF in NAD...  GENERAL:  Alert & oriented; pleasant & cooperative... HEENT:  [place]/AT, EOM-wnl, PERRLA, EACs-clear, TMs-wnl, NOSE-clear, THROAT-clear & wnl. NECK:  Supple w/ fairROM; no JVD; normal carotid impulses w/o bruits; no thyromegaly or nodules palpated; no  lymphadenopathy. CHEST:  Clear to P & A; without wheezes/ rales/ or rhonchi. HEART:  Regular Rhythm; without murmurs/ rubs/ or gallops. ABDOMEN:  Soft & nontender; normal bowel sounds; no organomegaly or masses detected. EXT: without deformities or arthritic changes; mild varicose veins/ venous insuffic/ tr edema. Scar on inner left thigh from melanoma surg, no palp adenopathy... NEURO:  CN's intact; motor testing normal; sensory testing normal; gait normal & balance OK. DERM:  No lesions noted; no rash etc...  RADIOLOGY DATA:  Reviewed in the EPIC EMR & discussed w/ the patient...    >>CXR 3/13 showed heart is wnl, lungs clear x sl incr markings, NAD.[name]  LABORATORY DATA:  Reviewed in the EPIC EMR & discussed w/ the patient...    >>LABS 3/13:  FLP- Improved on diet;  Chems- wnl;  CBC- wnl;  TSH=0.71 on Levothy100/d...   Assessment:     MELANOMA>  Bx- DrHouston (Clark level 2), wide excision- DrThompson (no residual tumor), oncology consult- DrEnnever (no adjuvant therapy)>  She is in good hand & favorable prognosis...  AB, ex-smoker>  She quit smoking 6/12 on a bet w/ her uncle; hopefully she can remain quit!  No resp exac & stable, no meds needed...  Hx MVP>  On ASA, she is asymptomatic w/o CP, palpit, etc...  CHOL>  FLP is much improved on diet & she will not need meds at this time...  Hypothyroid>  Stable on Levothy100 per day, continue same...  GERD>  She uses OTC Tagamet as needed (her preference)...  Divertics, Colon Polyps>  She is up to date on colon screening w/ f/u due 5/15...  Osteopenia>  On Calcium, MVI, Vit D; she stopped her Fosamax on her own in 2011...  Anxiety>  She does not want anxiolytic therapy...     Plan:     Patient's Medications  New Prescriptions   No medications on file  Previous Medications   ASPIRIN 81 MG TABLET    Take 81 mg by mouth daily.     CALCIUM CARBONATE-VITAMIN D (CALCIUM + D PO)    Take by mouth daily.     LUTEIN PO    Take 1 tablet  by mouth daily.   MULTIPLE VITAMIN (MULTIVITAMIN) TABLET    Take 1 tablet by mouth daily.     NICOTINE POLACRILEX (NICORETTE) 2 MG GUM    Take 2 mg by mouth as needed.    Modified Medications   Modified Medication Previous Medication   FLUTICASONE (FLONASE) 50 MCG/ACT  NASAL SPRAY fluticasone (FLONASE) 50 MCG/ACT nasal spray      Place 2 sprays into the nose as needed.    Place 2 sprays into the nose as needed.     LEVOTHYROXINE SODIUM 100 MCG CAPS Levothyroxine Sodium 100 MCG CAPS      Take 1 capsule (100 mcg total) by mouth daily.    Take 1 capsule (100 mcg total) by mouth daily.  Discontinued Medications   No medications on file

## 2011-04-30 ENCOUNTER — Telehealth: Payer: Self-pay | Admitting: *Deleted

## 2011-04-30 NOTE — Telephone Encounter (Signed)
Called and spoke with pt about her lab and cxr results per SN.  Pt voiced her understanding of these results.  Will keep the same meds for now and pt is to call for any further questions or concerns.

## 2011-05-10 ENCOUNTER — Ambulatory Visit (HOSPITAL_BASED_OUTPATIENT_CLINIC_OR_DEPARTMENT_OTHER): Payer: Medicare Other | Admitting: Hematology & Oncology

## 2011-05-10 ENCOUNTER — Other Ambulatory Visit (HOSPITAL_BASED_OUTPATIENT_CLINIC_OR_DEPARTMENT_OTHER): Payer: Medicare Other | Admitting: Lab

## 2011-05-10 VITALS — BP 135/81 | HR 65 | Temp 96.9°F | Ht 62.5 in | Wt 145.0 lb

## 2011-05-10 DIAGNOSIS — C439 Malignant melanoma of skin, unspecified: Secondary | ICD-10-CM

## 2011-05-10 DIAGNOSIS — C4370 Malignant melanoma of unspecified lower limb, including hip: Secondary | ICD-10-CM

## 2011-05-10 NOTE — Progress Notes (Signed)
CC:   Ariel Holmes. Ariel Basque, MD Ariel Holmes. [place], M.D. Ariel Holmes, M.D.  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  CURRENT THERAPY:  Observation.  INTERIM HISTORY:  Ariel Holmes comes in for her followup.  Her main complaint is a muscle pull in her upper back.  This happened a couple of days ago.  She has a hard time extending her neck. She has a little bit of a hard time extending her left shoulder.  I told her that this probably could be helped by a chiropractor or massage therapy.  I do not think she needs any x-rays for this.  It clearly, in my mind, is not related to this melanoma that she had in her thigh.  She has had no problems with left leg swelling.  She has had good range of motion of her joints.  She has had no problems with bowels or bladder.  She was down in [facility] last week.  She had a good time.  PHYSICAL EXAM:  General: This is a well-developed, well-nourished, white female in no obvious distress.  Vital signs: Show a temperature of 97, pulse 56 with heart rate 18, blood pressure 130/70, and weight is 145. Head and neck exam shows a normocephalic, atraumatic skull.  There are no ocular or oral lesions.  There are no palpable cervical or supraclavicular lymph nodes.  Lungs are clear bilaterally.  Cardiac examination:  Regular rhythm with a normal S1 and S2.  She does have a 2/6 systolic ejection murmur.  Abdominal exam: Soft with good bowel sounds.  There is no palpable abdominal mass.  There is no fluid wave. No palpable hepatosplenomegaly. Back exam: Shows no tenderness over the spine, ribs, or hips.  She does have some muscle spasm in the upper thoracic spine. This is mostly on the left side of the upper thoracic spine.  Extremities: Shows a well-healed, wide local excision on the inner upper left thigh.  There is some slight fullness to the medial aspect of the surgical incision.  This feels like a seroma.  There is  no swelling in the legs.  She has good range of motion of the joints.  She has good pulses in her distal extremities.  Skin exam: Shows no rashes, ecchymoses, or petechia.  Neurological exam: Shows no focal neurological deficits.  LABORATORY STUDIES:  (Done on 03/13), white count is 10, hemoglobin 13, hematocrit 41, and platelet count 217.  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  She had this resected back in November 2012.  Everything looks fine.  I think her risk of recurrence is going to be very low (less than 10%).  We will plan to get her back in 3 more months.    ______________________________ [name], M.D. PRE/MEDQ  D:  05/10/2011  T:  05/10/2011  Job:  [id number]

## 2011-05-10 NOTE — Progress Notes (Signed)
This office note has been dictated.

## 2011-06-10 ENCOUNTER — Encounter (INDEPENDENT_AMBULATORY_CARE_PROVIDER_SITE_OTHER): Payer: Medicare Other | Admitting: General Surgery

## 2011-06-30 ENCOUNTER — Encounter (INDEPENDENT_AMBULATORY_CARE_PROVIDER_SITE_OTHER): Payer: Medicare Other | Admitting: General Surgery

## 2011-07-06 ENCOUNTER — Other Ambulatory Visit: Payer: Self-pay | Admitting: Dermatology

## 2011-07-14 ENCOUNTER — Encounter (INDEPENDENT_AMBULATORY_CARE_PROVIDER_SITE_OTHER): Payer: Self-pay | Admitting: General Surgery

## 2011-07-14 ENCOUNTER — Ambulatory Visit (INDEPENDENT_AMBULATORY_CARE_PROVIDER_SITE_OTHER): Payer: Medicare Other | Admitting: General Surgery

## 2011-07-14 VITALS — BP 140/90 | HR 76 | Temp 99.3°F | Resp 14 | Ht 63.0 in | Wt 143.2 lb

## 2011-07-14 DIAGNOSIS — C4370 Malignant melanoma of unspecified lower limb, including hip: Secondary | ICD-10-CM

## 2011-07-14 NOTE — Progress Notes (Signed)
Subjective:     Patient ID: Ariel Holmes, female   DOB: 11-Oct-1940, 71 y.o.   MRN: [id number]  HPI Patient presents status post wide excision of melanoma left proximal thigh. This was a T1 A. N0 M0 lesion. She is following up with dermatology as well as Dr. Myna Hidalgo for medical oncology. She has not noticed any changes. She is feeling well. Her dermatologist just remove something else on her back. He told her there was only mild atypia with clear margins and no further treatment is necessary.  Review of Systems     Objective:   Physical Exam  Constitutional: She appears well-developed.  HENT:  Head: Normocephalic and atraumatic.  Cardiovascular: Normal rate and normal heart sounds.   Pulmonary/Chest: Effort normal and breath sounds normal.  Abdominal: Soft. There is no tenderness.  Left proximal thigh scar is intact. There is no discrete nodularity. No signs of infection. There is still some central scar tissue beneath the skin. The patient notes that this area has gradually improved. No inguinal lymphadenopathy.     Assessment:     Status post wide excision melanoma left thigh without evidence of local recurrence    Plan:     Patient will continue to see hematology and oncology as scheduled. I will see her back as needed.

## 2011-08-11 ENCOUNTER — Ambulatory Visit (HOSPITAL_BASED_OUTPATIENT_CLINIC_OR_DEPARTMENT_OTHER): Payer: Medicare Other | Admitting: Hematology & Oncology

## 2011-08-11 ENCOUNTER — Other Ambulatory Visit (HOSPITAL_BASED_OUTPATIENT_CLINIC_OR_DEPARTMENT_OTHER): Payer: Medicare Other | Admitting: Lab

## 2011-08-11 VITALS — BP 145/66 | HR 55 | Temp 97.7°F | Ht 63.0 in | Wt 143.0 lb

## 2011-08-11 DIAGNOSIS — C4370 Malignant melanoma of unspecified lower limb, including hip: Secondary | ICD-10-CM

## 2011-08-11 LAB — COMPREHENSIVE METABOLIC PANEL
AST: 22 U/L (ref 0–37)
Alkaline Phosphatase: 66 U/L (ref 39–117)
BUN: 13 mg/dL (ref 6–23)
Creatinine, Ser: 0.74 mg/dL (ref 0.50–1.10)
Glucose, Bld: 66 mg/dL — ABNORMAL LOW (ref 70–99)
Potassium: 4.2 mEq/L (ref 3.5–5.3)
Total Bilirubin: 0.3 mg/dL (ref 0.3–1.2)

## 2011-08-11 LAB — CBC WITH DIFFERENTIAL (CANCER CENTER ONLY)
BASO#: 0 10*3/uL (ref 0.0–0.2)
EOS%: 0.6 % (ref 0.0–7.0)
Eosinophils Absolute: 0.1 10*3/uL (ref 0.0–0.5)
HGB: 12.9 g/dL (ref 11.6–15.9)
LYMPH%: 53.5 % — ABNORMAL HIGH (ref 14.0–48.0)
MCH: 31.9 pg (ref 26.0–34.0)
MCHC: 33.7 g/dL (ref 32.0–36.0)
MCV: 95 fL (ref 81–101)
MONO%: 9.6 % (ref 0.0–13.0)
NEUT#: 3.6 10*3/uL (ref 1.5–6.5)
NEUT%: 36 % — ABNORMAL LOW (ref 39.6–80.0)

## 2011-08-11 NOTE — Progress Notes (Signed)
CC:   Lonzo Cloud. Kriste Basque, MD Norval Gable. [place], M.D. Gabrielle Dare Janee Morn, M.D.  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  CURRENT THERAPY:  Observation.  INTERIM HISTORY:  Ms. Stracener comes in for followup.  She is feeling a whole lot better.  When we last saw her in March, she was complaining of pain in the upper left back.  She went to a massage therapist.  She said this helped her out tremendously.  She is not having any problems herself.  Her husband, however, is having issues.  He is having a lot of problems with lymphedema. She has had no problems with nausea or vomiting.  There has been no change in bowel or bladder habits.  There has been no cough.  PHYSICAL EXAMINATION:  This is a well-developed, well-nourished white female in no obvious distress.  Vital signs:  Temperature of 97.7, pulse 55, respiratory rate 20, blood pressure 145/66.  Weight is 143.  Head and neck:  Normocephalic, atraumatic skull.  There are no ocular or oral lesions.  There are no palpable cervical or supraclavicular lymph nodes. Lungs:  Clear bilaterally.  Cardiac:  Regular rate and rhythm with normal S1, S2.  There are no murmurs, rubs or bruits.  Abdomen:  Soft with good bowel sounds.  There is no palpable abdominal mass.  There is no fluid wave.  There is no palpable hepatosplenomegaly.  Back:  No tenderness over the spine, ribs, or hips.  Extremities:  Shows the well- healed wide local excision scar in the left thigh.  This is in the upper inner portion of the left thigh.  She has good pulses in her distal extremities.  There is no erythema in the legs bilaterally. Neurological:  No focal neurological deficits.  LABORATORY STUDIES:  White cell count is 10.1, hemoglobin 12.9, hematocrit 38.3, platelet count 250.  Of note, Ms. Craigo did have a lesion removed from her back about a month or so ago.  This was a dysplastic nevus with no evidence of  invasive malignancy.  IMPRESSION:  Ms. Schou is a 71 year old white female with a stage I melanoma of the left thigh.  This was resected back in November 2012.  She is doing well.  I think her risk of recurrence is going to be very, very low.  We will go ahead and plan to get her back to see [place] in another 3-4 months.    ______________________________ [name], M.D. PRE/MEDQ  D:  08/11/2011  T:  08/11/2011  Job:  [id number]

## 2011-08-11 NOTE — Progress Notes (Signed)
This office note has been dictated.

## 2011-08-12 ENCOUNTER — Ambulatory Visit: Payer: Medicare Other | Admitting: Hematology & Oncology

## 2011-08-12 ENCOUNTER — Other Ambulatory Visit: Payer: Medicare Other | Admitting: Lab

## 2011-10-28 ENCOUNTER — Telehealth: Payer: Self-pay | Admitting: Pulmonary Disease

## 2011-10-28 ENCOUNTER — Other Ambulatory Visit: Payer: Self-pay | Admitting: Pulmonary Disease

## 2011-10-28 MED ORDER — LEVOTHYROXINE SODIUM 100 MCG PO CAPS: 100.0000 ug | ORAL_CAPSULE | Freq: Every day | ORAL | Status: DC

## 2011-10-28 NOTE — Telephone Encounter (Signed)
I spoke with pt and requested a refill on her levothyroxine. I have sent in RX. Nothing further was needed

## 2011-12-03 ENCOUNTER — Encounter: Payer: Self-pay | Admitting: Pulmonary Disease

## 2011-12-03 ENCOUNTER — Other Ambulatory Visit: Payer: Medicare Other | Admitting: Lab

## 2011-12-03 ENCOUNTER — Ambulatory Visit: Payer: Medicare Other | Admitting: Hematology & Oncology

## 2011-12-07 ENCOUNTER — Telehealth: Payer: Self-pay | Admitting: Hematology & Oncology

## 2011-12-07 NOTE — Telephone Encounter (Signed)
Patient called and cx 12/17/11 and resch for 12/20/11

## 2011-12-10 ENCOUNTER — Ambulatory Visit: Payer: Medicare Other | Admitting: Hematology & Oncology

## 2011-12-10 ENCOUNTER — Other Ambulatory Visit: Payer: Medicare Other | Admitting: Lab

## 2011-12-16 ENCOUNTER — Ambulatory Visit: Payer: Medicare Other | Admitting: Hematology & Oncology

## 2011-12-16 ENCOUNTER — Other Ambulatory Visit: Payer: Medicare Other | Admitting: Lab

## 2011-12-17 ENCOUNTER — Other Ambulatory Visit: Payer: Medicare Other | Admitting: Lab

## 2011-12-17 ENCOUNTER — Ambulatory Visit: Payer: Medicare Other | Admitting: Hematology & Oncology

## 2011-12-20 ENCOUNTER — Ambulatory Visit (HOSPITAL_BASED_OUTPATIENT_CLINIC_OR_DEPARTMENT_OTHER): Payer: Medicare Other | Admitting: Hematology & Oncology

## 2011-12-20 ENCOUNTER — Other Ambulatory Visit (HOSPITAL_BASED_OUTPATIENT_CLINIC_OR_DEPARTMENT_OTHER): Payer: Medicare Other | Admitting: Lab

## 2011-12-20 VITALS — BP 157/64 | HR 64 | Temp 97.3°F | Resp 16 | Ht 63.0 in | Wt 143.0 lb

## 2011-12-20 DIAGNOSIS — C4370 Malignant melanoma of unspecified lower limb, including hip: Secondary | ICD-10-CM

## 2011-12-20 DIAGNOSIS — D72829 Elevated white blood cell count, unspecified: Secondary | ICD-10-CM

## 2011-12-20 LAB — COMPREHENSIVE METABOLIC PANEL
AST: 22 U/L (ref 0–37)
Albumin: 4.4 g/dL (ref 3.5–5.2)
Alkaline Phosphatase: 70 U/L (ref 39–117)
Potassium: 4.1 mEq/L (ref 3.5–5.3)
Sodium: 142 mEq/L (ref 135–145)
Total Protein: 6.6 g/dL (ref 6.0–8.3)

## 2011-12-20 LAB — CBC WITH DIFFERENTIAL (CANCER CENTER ONLY)
BASO#: 0 10e3/uL (ref 0.0–0.2)
BASO%: 0.3 % (ref 0.0–2.0)
EOS%: 0.7 % (ref 0.0–7.0)
Eosinophils Absolute: 0.1 10e3/uL (ref 0.0–0.5)
HCT: 38.7 % (ref 34.8–46.6)
HGB: 12.9 g/dL (ref 11.6–15.9)
LYMPH#: 6 10e3/uL — ABNORMAL HIGH (ref 0.9–3.3)
LYMPH%: 52.3 % — ABNORMAL HIGH (ref 14.0–48.0)
MCH: 31.2 pg (ref 26.0–34.0)
MCHC: 33.3 g/dL (ref 32.0–36.0)
MCV: 94 fL (ref 81–101)
MONO#: 1.1 10e3/uL — ABNORMAL HIGH (ref 0.1–0.9)
MONO%: 10 % (ref 0.0–13.0)
NEUT#: 4.2 10e3/uL (ref 1.5–6.5)
NEUT%: 36.7 % — ABNORMAL LOW (ref 39.6–80.0)
Platelets: 266 10e3/uL (ref 145–400)
RBC: 4.13 10e6/uL (ref 3.70–5.32)
RDW: 13.8 % (ref 11.1–15.7)
WBC: 11.4 10e3/uL — ABNORMAL HIGH (ref 3.9–10.0)

## 2011-12-20 LAB — CHCC SATELLITE - SMEAR

## 2011-12-20 NOTE — Progress Notes (Signed)
This office note has been dictated.

## 2011-12-21 ENCOUNTER — Telehealth: Payer: Self-pay | Admitting: Hematology & Oncology

## 2011-12-21 NOTE — Progress Notes (Signed)
CC:   Ariel Holmes. Ariel Basque, MD Ariel Holmes. [place], M.D. Ariel Holmes, M.D.  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  CURRENT THERAPY:  Observation.  INTERIM HISTORY:  Ariel Holmes comes in for followup.  She is feeling well. She has had no complaints since we last saw her.  She had a good summer.  She has had no problems with nausea or vomiting.  There has been no cough or shortness of breath.  She has had no leg swelling.  There has been no change in bowel or bladder habits.  PHYSICAL EXAMINATION:  General:  This is a well-developed, well- nourished white female in no obvious distress.  Vital signs:  97.3, pulse 64, respiratory rate 16, blood pressure 157/64.  Weight is 143. Head and neck:  Shows a normocephalic, atraumatic skull.  There are no ocular or oral lesions.  There are no palpable cervical or supraclavicular lymph nodes.  Lungs:  Clear bilaterally.  Cardiac: Regular rate and rhythm with a normal S1 and S2.  There are no murmurs, rubs or bruits.  Abdomen:  Soft with good bowel sounds.  There is no palpable abdominal mass.  There is no palpable hepatosplenomegaly. Back:  No tenderness over the spine, ribs or hips.  Extremities:  Show no peripheral clubbing, cyanosis or edema.  She has a well-healed wide local excision scar in the inner upper left thigh.  Skin:  Shows no suspicious hyperpigmented lesions.  LABORATORY STUDIES:  White cell count 11.4, hemoglobin 12.9, hematocrit 38.7, platelet count 266.  IMPRESSION:  Ariel Holmes is a very nice 71 year old white female with stage T1A lentigo maligna melanoma of the left thigh.  This was excised. She had this done back in November of 2012.  Everything is looking wonderful right now.  We will plan to get her back in 6 months.  I noted that her white count is up a little bit.  We will have to watch this closely.  She does have a little bit of increase in  lymphocytes.    ______________________________ [name], M.D. PRE/MEDQ  D:  12/20/2011  T:  12/21/2011  Job:  [id number]

## 2011-12-21 NOTE — Telephone Encounter (Addendum)
Message copied by Cathi Roan on Tue Dec 21, 2011  3:08 PM ------      Message from: [name]      Created: Tue Dec 21, 2011  7:39 AM       Please call n labs are okay. As was because and will  12-21-11   3:08 PM   Called and spoke to patient regarding above MD message. Lupita Raider LPN

## 2011-12-23 ENCOUNTER — Telehealth: Payer: Self-pay | Admitting: *Deleted

## 2011-12-23 NOTE — Telephone Encounter (Signed)
Message copied by Anselm Jungling on Thu Dec 23, 2011  2:58 PM ------      Message from: [name]      Created: Tue Dec 21, 2011  8:16 PM       Call - labs are great!!! Cindee Lame

## 2011-12-23 NOTE — Telephone Encounter (Signed)
Called patient to let her know that her labwork was all good per dr. ennever 

## 2012-04-28 ENCOUNTER — Ambulatory Visit (INDEPENDENT_AMBULATORY_CARE_PROVIDER_SITE_OTHER)
Admission: RE | Admit: 2012-04-28 | Discharge: 2012-04-28 | Disposition: A | Discharge status: Home / Self Care | Payer: Medicare Other | Source: Ambulatory Visit | Attending: Pulmonary Disease | Admitting: Pulmonary Disease

## 2012-04-28 ENCOUNTER — Ambulatory Visit (INDEPENDENT_AMBULATORY_CARE_PROVIDER_SITE_OTHER): Payer: Medicare Other | Admitting: Pulmonary Disease

## 2012-04-28 ENCOUNTER — Encounter: Payer: Self-pay | Admitting: Pulmonary Disease

## 2012-04-28 ENCOUNTER — Other Ambulatory Visit (INDEPENDENT_AMBULATORY_CARE_PROVIDER_SITE_OTHER): Payer: Medicare Other

## 2012-04-28 VITALS — BP 144/68 | HR 62 | Temp 97.9°F | Ht 62.5 in | Wt 142.6 lb

## 2012-04-28 DIAGNOSIS — C4370 Malignant melanoma of unspecified lower limb, including hip: Secondary | ICD-10-CM

## 2012-04-28 DIAGNOSIS — K219 Gastro-esophageal reflux disease without esophagitis: Secondary | ICD-10-CM

## 2012-04-28 DIAGNOSIS — D126 Benign neoplasm of colon, unspecified: Secondary | ICD-10-CM

## 2012-04-28 DIAGNOSIS — J209 Acute bronchitis, unspecified: Secondary | ICD-10-CM

## 2012-04-28 DIAGNOSIS — E7800 Pure hypercholesterolemia, unspecified: Secondary | ICD-10-CM

## 2012-04-28 DIAGNOSIS — E039 Hypothyroidism, unspecified: Secondary | ICD-10-CM

## 2012-04-28 DIAGNOSIS — C4372 Malignant melanoma of left lower limb, including hip: Secondary | ICD-10-CM

## 2012-04-28 DIAGNOSIS — F411 Generalized anxiety disorder: Secondary | ICD-10-CM

## 2012-04-28 DIAGNOSIS — Z8679 Personal history of other diseases of the circulatory system: Secondary | ICD-10-CM

## 2012-04-28 DIAGNOSIS — I868 Varicose veins of other specified sites: Secondary | ICD-10-CM

## 2012-04-28 DIAGNOSIS — M899 Disorder of bone, unspecified: Secondary | ICD-10-CM

## 2012-04-28 DIAGNOSIS — M949 Disorder of cartilage, unspecified: Secondary | ICD-10-CM

## 2012-04-28 LAB — LDL CHOLESTEROL, DIRECT: Direct LDL: 109.3 mg/dL

## 2012-04-28 LAB — TSH: TSH: 0.42 u[IU]/mL (ref 0.35–5.50)

## 2012-04-28 MED ORDER — LEVOTHYROXINE SODIUM 100 MCG PO TABS: 100.0000 ug | ORAL_TABLET | Freq: Every day | ORAL | Status: DC

## 2012-04-28 NOTE — Progress Notes (Signed)
Subjective:     Patient ID: Ariel Holmes, female   DOB: 26-Jan-1941, 72 y.o.   MRN: [id number]  HPI 72 y/o WF here for a follow up visit... she has multiple medical problems as noted below...    ~  March 11, 2010:  she tripped 6/11 w/ fx 5th metatarsal req cast, boot, etc per DrRendall- now resolved... still smoking but down less than 1/2 ppd she says & denies smokers cough, sputum, SOB/ DOE, etc> denies CP, palpit, or cardiac problems- exerc by walking some she says... she reports prolapsed bladder per GYN... CXR- NAD, & Labs- stable w/ hi HDL.Marland KitchenMarland Kitchen OK Pneumovax & TDAP today.  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 She quit smoking 6/12 after making a contract w/ her uncle for $1000 per month for 38mo of not smoking!!! She is still chewing nicorette gum... She has been trying to control Chol on diet alone as she states "I can't take the --TORS"; she would be willing to try Zetia which her mother takes along w/ CoQ10... Other medical problems as listed below> her only prescription meds as Flonase & Levothyroid 111mcg/d... CXR 3/13 showed heart is wnl, lungs clear x sl incr markings, NAD.[name] LABS 3/13:  FLP- Improved on diet;  Chems- wnl;  CBC- wnl;  TSH=0.71 on Levothy100/d...  ~  April 28, 2012:  43yr ROV & Ariel Holmes has had a difficult yr w/ husb illness but has received the most relief from a massage therapist- "it really helps"; still works in [employer]... We reviewed the following medical problems during today's office visit >>     AR, AB, ex-smoker> on OTC Antihist, Flonase, & Nicorette; she quit smoking 6/12 but still using Nicotine replacement...    MVP> on ASA81; she denies CP, palpit, SOB, edema, etc...    Ven Insuffic> she knows to avoid  sodium, elev legs, wear support hose...    Chol> on diet alone esp w/ her good HDL; Labs 3/14 showed TChol 228, TG 92, HDL 88, LDL 109    Hypothy> on Synthroid100; Labs showed TSH= 0.42; she remains clinically & biochem euthyroid...    GI- GERD, Polyps, Hems> stable w/o abd pain, dysphagia, n/v, c/d, blood seen; last colon 5/10 w/ divertics, 2 adenomas removed, f/u due 5/15...    Osteopenia> on Calcium, MVI, VitD2000; she stopped prev Fosamax Rx yrs ago- last BMD 2009 w/ Tscore -1.5 in [facility]...    Anxiety> under stress w/ husb illness but declines anxiolytic meds...    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 We reviewed prob list, meds, xrays and labs> see below for updates >> she had the 2013 Flu vaccine 10/13... LABS 11/13:  Chems- wnl;  CBC- wnl... CXR 3/14 showed normal heart size, calcif & ectasia of Ao, clear lungs, DJD/ sl scoliosis of spine/ osteopenia... LABS 3/14:  FLP- not at goals on diet alone but hi HDL;  TSH=0.42;  VitD=60         PROBLEM LIST:    ALLERGIC RHINITIS - uses FLONASE Prn...  ASTHMATIC BRONCHITIS, ACUTE (ICD-466.0) - no recent bronchitic exac... not on regular meds. ~  CXR 12/09 showed hyperinflation &  incr markings, spondylosis in TSpine... 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 ~  CXR 3/13 showed heart size is wnl, lungs clear x sl incr markings, NAD. ~  CXR 3/14 showed normal heart size, calcif & ectasia of Ao, clear lungs, DJD/ sl scoliosis of spine/ osteopenia.  CIGARETTE SMOKER (ICD-305.1) >> states she quit smoking 6/12 on a bet from her uncle for $1000/mo for 55mo of smoking cessation...  MITRAL VALVE PROLAPSE, HX OF (ICD-V12.50) - on ASA 81mg /d... hx mild MVP on 2DEcho in 1995 (no signif MR)... asymptomatic without CP or palpit...  VARICOSE VEIN (ICD-456.8) - hx mild VV and ven insuffic w/ intermittent edema... she knows to avoid sodium, elevate legs, wear  support hose, etc...  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 Keep up the good work. ~  FLP 3/14 on diet alone showed TChol 228, TG 92, HDL 88, LDL 109  HYPOTHYROIDISM (ICD-244.9) - on LEVOTHYROID 157mcg/d... ~  labs 12/09 showed TSH= 0.44 ~  labs 1/11 showed TSH= 0.66 ~  labs 1/12 showed TSH= 0.85 ~  Labs 3/13 on Levo100 showed TSH= 0.71 ~  Labs 3/14 on Levo100 showed TSH=0.42  ESOPHAGEAL REFLUX (ICD-530.81) - she uses Cimetadine 300mg  Prn for "Stomach" & requests refill from [place]...  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 ~  colonoscopy 4/00 by DrDBrodie showed several 6-56mm polyps= adenomatous... ~  colonoscopy 4/05 by DrDBrodie showed only melanosis and hems... ~  colonoscopy 5/10 DrDBrodie showed mild divertics, 2 cecal polyps- tubular adenoma, f/u 69yrs.  OSTEOPENIA (ICD-733.90) - on calcium, MVI, Vit D 2000 u daily... she stopped her prev Fosamax rx on her own in 2011. ~  BMD here 8/07 showed TScores -1.2 in Spine, and -1.8 in left FemNeck. ~  labs 8/08 showed Vit D level= 38 ~  BMD here 9/09 showed TScores -0.8 in Spine, and -1.5 in left FemNeck. ~  Labs 1/11 showed Vit D level = 51 ~  Labs 3/14 showed Vit D level = 60  ANXIETY (ICD-300.00) - not currently on meds.  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 ~  She reports on-going follow up from [facility] & DrEnnever ?every 3-63mo? ~  11/13: she saw DrEnnever for follow up> on observation, doing well w/o recurrence...   Health  Maintenance: ~  neg Mamogram at [facility]- followed yearly... due for GYN check by [facility] & she will call. ~  Immunizations:  she gets the yearly seasonal Flu vaccines... she received Shingles vaccine 2/09 at [facility]... had PNEUMOVAX in 2002 & 1/12 (age 10)... given TDAP 1/12.   Past Surgical History  Procedure Laterality Date  . Breast surgery      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 Patient does not remember exact date of surgery.  . Tonsillectomy    . Colonoscopy    . Cesarean section  1965  . Melanoma excision  12/31/2010    Procedure: MELANOMA  EXCISION;  Surgeon: Liz Malady, MD;  Location: [facility];  Service: General;  Laterality: Left;  wide excision melanoma left thigh, excision lesion left thigh    Outpatient Encounter Prescriptions as of 04/28/2012  Medication Sig Dispense Refill  . aspirin 81 MG tablet Take 81 mg by mouth daily.        . Calcium Carbonate-Vitamin D (CALCIUM + D PO) Take by mouth daily.        . Cholecalciferol (VITAMIN D) 2000 UNITS CAPS Take 1 capsule by mouth daily.      . fluticasone (FLONASE) 50 MCG/ACT nasal spray Place 2 sprays into the nose as needed.  16 g  5  . levothyroxine (SYNTHROID, LEVOTHROID) 100 MCG tablet TAKE 1 TABLET EVERY DAY  30 tablet  1  . LUTEIN PO Take 1 tablet by mouth daily.      . Multiple Vitamin (MULTIVITAMIN) tablet Take 1 tablet by mouth daily.        . nicotine polacrilex (NICORETTE) 2 MG gum Take 2 mg by mouth as needed.         No facility-administered encounter medications on file as of 04/28/2012.    Allergies  Allergen Reactions  . Codeine Nausea And Vomiting    Current Medications, Allergies, Past Medical History, Past Surgical History, Family History, and Social History were reviewed in [employer] record.   Review of Systems         The patient complains of fatigue, dyspnea on exertion, cough, gas/bloating, incontinence, joint pain, arthritis, and anxiety.   The patient denies fever, chills, sweats, anorexia, weakness, malaise, weight loss, sleep disorder, blurring, diplopia, eye irritation, eye discharge, vision loss, eye pain, photophobia, earache, ear discharge, tinnitus, decreased hearing, nasal congestion, nosebleeds, sore throat, hoarseness, chest pain, palpitations, syncope, orthopnea, PND, peripheral edema, dyspnea at rest, excessive sputum, hemoptysis, wheezing, pleurisy, nausea, vomiting, diarrhea, constipation, change in bowel habits, abdominal pain, melena, hematochezia, jaundice, indigestion/heartburn, dysphagia, odynophagia, dysuria, hematuria, urinary frequency, urinary hesitancy, nocturia, back pain, joint swelling, muscle cramps, muscle weakness, stiffness, sciatica, restless legs, leg pain at night, leg pain with exertion, rash, itching, dryness, suspicious lesions, paralysis, paresthesias, seizures, tremors, vertigo, transient blindness, frequent falls, frequent headaches, difficulty walking, depression, memory loss, confusion, cold intolerance, heat intolerance, polydipsia, polyphagia, polyuria, unusual weight change, abnormal bruising, bleeding, enlarged lymph nodes, urticaria, allergic rash, hay fever, and recurrent infections.     Objective:   Physical Exam     WD, WN, 72 y/o WF in NAD...  GENERAL:  Alert & oriented; pleasant & cooperative... HEENT:  [place]/AT, EOM-wnl, PERRLA, EACs-clear, TMs-wnl, NOSE-clear, THROAT-clear & wnl. NECK:  Supple w/ fairROM; no JVD; normal carotid impulses w/o bruits; no thyromegaly or nodules palpated; no lymphadenopathy. CHEST:  Clear to P & A; without wheezes/ rales/ or rhonchi. HEART:  Regular Rhythm; without murmurs/ rubs/ or gallops. ABDOMEN:  Soft & nontender; normal bowel sounds; no organomegaly or masses detected. EXT: without deformities or arthritic changes; mild varicose veins/ venous insuffic/ tr edema. Scar on inner left thigh from melanoma surg, no palp adenopathy... NEURO:  CN's intact;  motor testing normal; sensory testing normal; gait normal & balance OK. DERM:  No lesions noted; no rash etc...  RADIOLOGY DATA:  Reviewed in the EPIC EMR & discussed w/ the patient...    >>CXR 3/13 showed heart is wnl, lungs clear x sl incr markings, NAD.[name]  LABORATORY DATA:  Reviewed in the EPIC EMR & discussed w/ the patient...    >>LABS  3/13:  FLP- Improved on diet;  Chems- wnl;  CBC- wnl;  TSH=0.71 on Levothy100/d...   Assessment:      MELANOMA>  Bx- DrHouston (Clark level 2), wide excision- DrThompson (no residual tumor), oncology consult- DrEnnever (no adjuvant therapy)>  She is in good hands & favorable prognosis...  AB, ex-smoker>  She quit smoking 6/12 on a bet w/ her uncle; & she has remain quit!  No resp exac & stable, no meds needed...  Hx MVP>  On ASA, she is asymptomatic w/o CP, palpit, etc...  CHOL>  FLP is much improved on diet & she will not need meds at this time...  Hypothyroid>  Stable on Levothy100 per day, continue same...  GERD>  She uses OTC Tagamet as needed (her preference)...  Divertics, Colon Polyps>  She is up to date on colon screening w/ f/u due 5/15...  Osteopenia>  On Calcium, MVI, Vit D; she stopped her Fosamax on her own in 2011...  Anxiety>  She does not want anxiolytic therapy...     Plan:     Patient's Medications  New Prescriptions   No medications on file  Previous Medications   ASPIRIN 81 MG TABLET    Take 81 mg by mouth daily.     CALCIUM CARBONATE-VITAMIN D (CALCIUM + D PO)    Take by mouth daily.     CHOLECALCIFEROL (VITAMIN D) 2000 UNITS CAPS    Take 1 capsule by mouth daily.   FLUTICASONE (FLONASE) 50 MCG/ACT NASAL SPRAY    Place 2 sprays into the nose as needed.   LUTEIN PO    Take 1 tablet by mouth daily.   MULTIPLE VITAMIN (MULTIVITAMIN) TABLET    Take 1 tablet by mouth daily.     NICOTINE POLACRILEX (NICORETTE) 2 MG GUM    Take 2 mg by mouth as needed.    Modified Medications   Modified Medication Previous Medication    LEVOTHYROXINE (SYNTHROID, LEVOTHROID) 100 MCG TABLET levothyroxine (SYNTHROID, LEVOTHROID) 100 MCG tablet      Take 1 tablet (100 mcg total) by mouth daily.    TAKE 1 TABLET EVERY DAY  Discontinued Medications   No medications on file

## 2012-04-28 NOTE — Patient Instructions (Addendum)
Today we updated your med list in our EPIC system...    Continue your current medications the same...    We refilled your meds per request...  Today we did your follow up CXR & FASTING blood work...    We will contact you w/ the results when available...   Call for any questions or if we can be of service in any way... 

## 2012-04-29 LAB — VITAMIN D 25 HYDROXY (VIT D DEFICIENCY, FRACTURES): Vit D, 25-Hydroxy: 60 ng/mL (ref 30–89)

## 2012-06-19 ENCOUNTER — Ambulatory Visit (HOSPITAL_BASED_OUTPATIENT_CLINIC_OR_DEPARTMENT_OTHER): Payer: Medicare Other | Admitting: Hematology & Oncology

## 2012-06-19 ENCOUNTER — Ambulatory Visit (HOSPITAL_BASED_OUTPATIENT_CLINIC_OR_DEPARTMENT_OTHER): Payer: Medicare Other | Admitting: Lab

## 2012-06-19 DIAGNOSIS — C4370 Malignant melanoma of unspecified lower limb, including hip: Secondary | ICD-10-CM

## 2012-06-19 DIAGNOSIS — D72829 Elevated white blood cell count, unspecified: Secondary | ICD-10-CM

## 2012-06-19 DIAGNOSIS — C4372 Malignant melanoma of left lower limb, including hip: Secondary | ICD-10-CM

## 2012-06-19 LAB — CBC WITH DIFFERENTIAL (CANCER CENTER ONLY)
BASO#: 0.1 10*3/uL (ref 0.0–0.2)
Eosinophils Absolute: 0.1 10*3/uL (ref 0.0–0.5)
HCT: 39.2 % (ref 34.8–46.6)
HGB: 12.8 g/dL (ref 11.6–15.9)
LYMPH#: 5.3 10*3/uL — ABNORMAL HIGH (ref 0.9–3.3)
MONO#: 1.1 10*3/uL — ABNORMAL HIGH (ref 0.1–0.9)
NEUT#: 4.4 10*3/uL (ref 1.5–6.5)
NEUT%: 39.9 % (ref 39.6–80.0)
RBC: 4.11 10*6/uL (ref 3.70–5.32)
WBC: 10.9 10*3/uL — ABNORMAL HIGH (ref 3.9–10.0)

## 2012-06-19 LAB — COMPREHENSIVE METABOLIC PANEL
Albumin: 4.2 g/dL (ref 3.5–5.2)
Alkaline Phosphatase: 68 U/L (ref 39–117)
BUN: 12 mg/dL (ref 6–23)
Calcium: 9.8 mg/dL (ref 8.4–10.5)
Chloride: 102 mEq/L (ref 96–112)
Glucose, Bld: 83 mg/dL (ref 70–99)
Potassium: 4.2 mEq/L (ref 3.5–5.3)
Sodium: 138 mEq/L (ref 135–145)
Total Protein: 6.8 g/dL (ref 6.0–8.3)

## 2012-06-19 NOTE — Progress Notes (Signed)
This office note has been dictated.

## 2012-06-20 NOTE — Progress Notes (Signed)
CC:   Lonzo Cloud. Kriste Basque, MD Norval Gable. Houston, M.D. Gabrielle Dare Janee Morn, M.D.  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  CURRENT THERAPY:  Observation.  INTERIM HISTORY:  Ms. Attaway comes in for followup.  She is doing well. We see her every 6 months.  Since we last saw her, she has had no problems.  There have been no problems with swelling of the left leg. She has had no nausea or vomiting.  There has been no change in bowel or bladder habits.  She has had no headache.  There has been no double vision or blurred vision.  She is a little bit sad that her dermatologist, Dr. Londell Moh, is going to retire.  Of note, she did have a chest x-ray back in March.  This did not show anything that was suspicious within her lungs.  PHYSICAL EXAMINATION:  General:  This is a well-developed, well- nourished white female in no obvious distress.  Vital signs: Temperature of 97.6, pulse 56, respiratory rate 16, blood pressure 144/53.  Weight is 141.  Head and neck:  Normocephalic, atraumatic skull.  There are no ocular or oral lesions.  There are no palpable cervical or supraclavicular lymph nodes.  Lungs:  Clear bilaterally. Cardiac:  Regular rate and rhythm with a normal S1 and S2.  There are no murmurs, rubs or bruits.  Abdomen:  Soft with good bowel sounds.  There is no palpable abdominal mass.  There is no fluid wave.  There is no palpable hepatosplenomegaly.  Back:  No tenderness over the spine, ribs or hips.  There may be some slight kyphosis.  Extremities:  A wide local excision scar on the upper inner left thigh.  This is well healed. There are no satellite nodules noted.  There is no lymphedema noted in the left leg.  Right leg is unremarkable.  Neurologic:  No focal neurological deficits.  LABORATORY STUDIES:  White cell count is 10.9, hemoglobin 12.8, hematocrit 39.2, platelet count 275.  IMPRESSION:  Ms. 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  This is a very indolent-type melanoma.  She had this resected back in November 2012.  We will plan to get back in 6 months' time.  I do not see any indication for lab work or x-ray that need to be done in between visits.   ______________________________ [name], M.D. PRE/MEDQ  D:  06/19/2012  T:  06/20/2012  Job:  [id number]

## 2012-06-30 ENCOUNTER — Other Ambulatory Visit: Payer: Self-pay | Admitting: Dermatology

## 2012-07-17 ENCOUNTER — Other Ambulatory Visit: Payer: Self-pay | Admitting: Dermatology

## 2012-07-24 ENCOUNTER — Other Ambulatory Visit: Payer: Self-pay | Admitting: Pulmonary Disease

## 2012-11-14 ENCOUNTER — Ambulatory Visit (INDEPENDENT_AMBULATORY_CARE_PROVIDER_SITE_OTHER): Payer: Medicare Other

## 2012-11-14 DIAGNOSIS — Z23 Encounter for immunization: Secondary | ICD-10-CM

## 2012-12-20 ENCOUNTER — Ambulatory Visit: Payer: Medicare Other | Admitting: Hematology & Oncology

## 2012-12-20 ENCOUNTER — Other Ambulatory Visit: Payer: Medicare Other | Admitting: Lab

## 2013-01-18 ENCOUNTER — Ambulatory Visit (HOSPITAL_BASED_OUTPATIENT_CLINIC_OR_DEPARTMENT_OTHER): Payer: Medicare Other | Admitting: Hematology & Oncology

## 2013-01-18 ENCOUNTER — Other Ambulatory Visit (HOSPITAL_BASED_OUTPATIENT_CLINIC_OR_DEPARTMENT_OTHER): Payer: Medicare Other | Admitting: Lab

## 2013-01-18 VITALS — Wt 141.0 lb

## 2013-01-18 DIAGNOSIS — C4370 Malignant melanoma of unspecified lower limb, including hip: Secondary | ICD-10-CM

## 2013-01-18 DIAGNOSIS — C4371 Malignant melanoma of right lower limb, including hip: Secondary | ICD-10-CM

## 2013-01-18 DIAGNOSIS — C4372 Malignant melanoma of left lower limb, including hip: Secondary | ICD-10-CM

## 2013-01-18 LAB — CMP (CANCER CENTER ONLY)
ALT(SGPT): 23 U/L (ref 10–47)
AST: 22 U/L (ref 11–38)
Creat: 0.8 mg/dl (ref 0.6–1.2)
Sodium: 147 mEq/L — ABNORMAL HIGH (ref 128–145)
Total Bilirubin: 0.7 mg/dl (ref 0.20–1.60)
Total Protein: 7.5 g/dL (ref 6.4–8.1)

## 2013-01-18 LAB — LACTATE DEHYDROGENASE: LDH: 140 U/L (ref 94–250)

## 2013-01-18 LAB — CBC WITH DIFFERENTIAL (CANCER CENTER ONLY)
BASO%: 0.3 % (ref 0.0–2.0)
Eosinophils Absolute: 0.1 10*3/uL (ref 0.0–0.5)
LYMPH%: 48.9 % — ABNORMAL HIGH (ref 14.0–48.0)
MCH: 30.8 pg (ref 26.0–34.0)
MCV: 95 fL (ref 81–101)
MONO#: 0.8 10*3/uL (ref 0.1–0.9)
MONO%: 8.2 % (ref 0.0–13.0)
NEUT#: 4 10*3/uL (ref 1.5–6.5)
Platelets: 255 10*3/uL (ref 145–400)
RBC: 4.15 10*6/uL (ref 3.70–5.32)
RDW: 14.4 % (ref 11.1–15.7)
WBC: 9.4 10*3/uL (ref 3.9–10.0)

## 2013-01-18 NOTE — Progress Notes (Signed)
This office note has been dictated.

## 2013-01-22 NOTE — Progress Notes (Signed)
CC:   Lonzo Cloud. 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  CURRENT THERAPY:  Observation.  INTERIM HISTORY:  Ms. Luster comes in for followup.  Since I last saw her, she had a dysplastic nevus taken off her right upper arm.  This was done on June 2nd.  The path report ([id number]) showed a dysplastic nevus.  All margins were free.  She has had no issues otherwise.  There has been no change in medications.  She has had no fever, sweats, or chills.  She has had no cough or shortness of breath.  She has had no change in bowel or bladder habits.  She has had no leg swelling.  PHYSICAL EXAMINATION:  General:  This is a well-developed, well- nourished white female in no obvious distress.  Vital Signs: Temperature of 97.6, pulse 65, respiratory rate 16, blood pressure 139/68.  Weight is 141 pounds.  Head and Neck:  Normocephalic, atraumatic skull.  There are no ocular or oral lesions.  There are no palpable cervical or supraclavicular lymph nodes.  Lungs:  Clear bilaterally.  Cardiac:  Regular rate and rhythm with a normal S1 and S2. There are no murmurs, rubs, or bruits.  Abdomen:  Soft.  She has good bowel sounds.  There is no fluid wave.  No palpable hepatosplenomegaly. Back:  No tenderness over the spine, ribs, or hips.  Extremities:  Show a well-healed wide local excision scar in the upper inner left thigh. She has a biopsy scar in the upper right arm.  No lumps or nodules are noted under the skin on extremities.  She has good range of motion of her joints.  She has good strength in upper and lower extremities. Skin:  No unusual lesions.  She has no suspicious nevi.  Neurological: No focal neurological deficits.  LABORATORY STUDIES:  White cell count is 9.4, hemoglobin 12.8, hematocrit 39.3, platelet count 255.  Sodium 147, potassium 4.4, BUN 11, creatinine 0.8.  LFTs are normal.  Mammogram (done on December 15, 2012) shows no  suspicious microcalcifications.  IMPRESSION:  Ms. Fine is a nice 72 year old white female with a resected lentigo maligna melanoma of the left thigh.  She is doing quite well.  Thankfully, she was found to have this dysplastic nevus before it turned malignant.  It has been 2 years that she had the melanoma removed from left thigh. This was done in November 2012.  We will go ahead and plan to get her back in 6 more months.   ______________________________ [name], M.D. PRE/MEDQ  D:  01/18/2013  T:  01/22/2013  Job:  [id number]

## 2013-02-01 ENCOUNTER — Telehealth: Payer: Self-pay | Admitting: Pulmonary Disease

## 2013-02-01 NOTE — Telephone Encounter (Signed)
Called and spoke with pt and she stated that fred is in the hospital and her stomach is giving her a fit.  She would like to have tagament sent in to her pharmacy.  Pt stated that she doe not take this very often but recently every time she eats her stomach aches.  She stated that she knows this is related to the stress she is under but would like the rx sent in.  This is not on the pts  Med list.  SN please advise. thanks  Allergies  Allergen Reactions  . Codeine Nausea And Vomiting     Current Outpatient Prescriptions on File Prior to Visit  Medication Sig Dispense Refill  . acyclovir ointment (ZOVIRAX) 5 % Apply 1 application topically as needed.       [name] aspirin 81 MG tablet Take 81 mg by mouth daily.        . Calcium Carbonate-Vitamin D (CALCIUM + D PO) Take by mouth daily.        . Cholecalciferol (VITAMIN D) 2000 UNITS CAPS Take 1 capsule by mouth daily.      . fluticasone (FLONASE) 50 MCG/ACT nasal spray Place 2 sprays into the nose as needed.  16 g  5  . levothyroxine (SYNTHROID, LEVOTHROID) 100 MCG tablet Take 1 tablet (100 mcg total) by mouth daily.  30 tablet  11  . levothyroxine (SYNTHROID, LEVOTHROID) 100 MCG tablet TAKE 1 TABLET (100 MCG TOTAL) BY MOUTH DAILY.  30 tablet  6  . LUTEIN PO Take 1 tablet by mouth daily.      . Multiple Vitamin (MULTIVITAMIN) tablet Take 1 tablet by mouth daily.        . nicotine polacrilex (NICORETTE) 2 MG gum Take 2 mg by mouth as needed. Not smoking       No current facility-administered medications on file prior to visit.

## 2013-02-02 MED ORDER — CIMETIDINE 400 MG PO TABS: 400.0000 mg | ORAL_TABLET | Freq: Two times a day (BID) | ORAL | Status: DC

## 2013-02-02 NOTE — Telephone Encounter (Signed)
Refill has been sent in to the pharmacy and pt is aware. Nothing further is needed.

## 2013-03-01 ENCOUNTER — Telehealth: Payer: Self-pay | Admitting: Pulmonary Disease

## 2013-03-01 MED ORDER — OSELTAMIVIR PHOSPHATE 75 MG PO CAPS: 75.0000 mg | ORAL_CAPSULE | Freq: Two times a day (BID) | ORAL | Status: DC

## 2013-03-01 NOTE — Telephone Encounter (Signed)
Spoke with husband- Pt states her husband has the flu with fever of 101.8 Pt c/o hacky cough and sore throat. Worried she might be coming down with the flu Denies ache, joint aches or fever.   Does anything need to be done? Allergies  Allergen Reactions  . Codeine Nausea And Vomiting     Medication List       This list is accurate as of: 03/01/13  5:34 PM.  Always use your most recent med list.               acyclovir ointment 5 %  Commonly known as:  ZOVIRAX  Apply 1 application topically as needed.     aspirin 81 MG tablet  Take 81 mg by mouth daily.     CALCIUM + D PO  Take by mouth daily.     cimetidine 400 MG tablet  Commonly known as:  TAGAMET  Take 1 tablet (400 mg total) by mouth 2 (two) times daily.     fluticasone 50 MCG/ACT nasal spray  Commonly known as:  FLONASE  Place 2 sprays into the nose as needed.     levothyroxine 100 MCG tablet  Commonly known as:  SYNTHROID, LEVOTHROID  Take 1 tablet (100 mcg total) by mouth daily.     levothyroxine 100 MCG tablet  Commonly known as:  SYNTHROID, LEVOTHROID  TAKE 1 TABLET (100 MCG TOTAL) BY MOUTH DAILY.     LUTEIN PO  Take 1 tablet by mouth daily.     multivitamin tablet  Take 1 tablet by mouth daily.     nicotine polacrilex 2 MG gum  Commonly known as:  NICORETTE  Take 2 mg by mouth as needed. Not smoking     Vitamin D 2000 UNITS Caps  Take 1 capsule by mouth daily.         Please advise Dr Annamaria Boots. Thanks.

## 2013-03-01 NOTE — Telephone Encounter (Signed)
Pt aware that Rx has been sent to [facility].

## 2013-03-01 NOTE — Telephone Encounter (Signed)
Per CY-patient can go ahead and have tamiflu 75mg  #10 take 1 po BID no refills. Thanks.

## 2013-03-05 ENCOUNTER — Telehealth: Payer: Self-pay | Admitting: Pulmonary Disease

## 2013-03-05 DIAGNOSIS — M899 Disorder of bone, unspecified: Secondary | ICD-10-CM

## 2013-03-05 DIAGNOSIS — E7800 Pure hypercholesterolemia, unspecified: Secondary | ICD-10-CM

## 2013-03-05 DIAGNOSIS — E039 Hypothyroidism, unspecified: Secondary | ICD-10-CM

## 2013-03-05 DIAGNOSIS — M949 Disorder of cartilage, unspecified: Secondary | ICD-10-CM

## 2013-03-05 NOTE — Telephone Encounter (Signed)
Labs have been ordered. Pt is aware. 

## 2013-04-19 ENCOUNTER — Other Ambulatory Visit (INDEPENDENT_AMBULATORY_CARE_PROVIDER_SITE_OTHER): Payer: Medicare Other

## 2013-04-19 DIAGNOSIS — E7800 Pure hypercholesterolemia, unspecified: Secondary | ICD-10-CM

## 2013-04-19 DIAGNOSIS — M899 Disorder of bone, unspecified: Secondary | ICD-10-CM

## 2013-04-19 DIAGNOSIS — M949 Disorder of cartilage, unspecified: Secondary | ICD-10-CM

## 2013-04-19 DIAGNOSIS — E039 Hypothyroidism, unspecified: Secondary | ICD-10-CM

## 2013-04-19 LAB — LIPID PANEL
CHOLESTEROL: 209 mg/dL — AB (ref 0–200)
HDL: 83.3 mg/dL (ref >39.00)
LDL Cholesterol: 110 mg/dL — ABNORMAL HIGH (ref 0–99)
TRIGLYCERIDES: 78 mg/dL (ref 0.0–149.0)
Total CHOL/HDL Ratio: 3
VLDL: 15.6 mg/dL (ref 0.0–40.0)

## 2013-04-19 LAB — TSH: TSH: 0.43 u[IU]/mL (ref 0.35–5.50)

## 2013-04-19 LAB — LDL CHOLESTEROL, DIRECT: Direct LDL: 105 mg/dL

## 2013-04-20 LAB — VITAMIN D 25 HYDROXY (VIT D DEFICIENCY, FRACTURES): Vit D, 25-Hydroxy: 79 ng/mL (ref 30–89)

## 2013-04-24 ENCOUNTER — Other Ambulatory Visit: Payer: Self-pay | Admitting: Pulmonary Disease

## 2013-04-24 MED ORDER — LEVOTHYROXINE SODIUM 100 MCG PO TABS: 100.0000 ug | ORAL_TABLET | Freq: Every day | ORAL | Status: DC

## 2013-05-03 ENCOUNTER — Encounter: Payer: Self-pay | Admitting: Internal Medicine

## 2013-05-04 ENCOUNTER — Ambulatory Visit (INDEPENDENT_AMBULATORY_CARE_PROVIDER_SITE_OTHER): Payer: Medicare Other | Admitting: Pulmonary Disease

## 2013-05-04 ENCOUNTER — Ambulatory Visit (INDEPENDENT_AMBULATORY_CARE_PROVIDER_SITE_OTHER)
Admission: RE | Admit: 2013-05-04 | Discharge: 2013-05-04 | Disposition: A | Discharge status: Home / Self Care | Payer: Medicare Other | Source: Ambulatory Visit | Attending: Pulmonary Disease | Admitting: Pulmonary Disease

## 2013-05-04 ENCOUNTER — Encounter (INDEPENDENT_AMBULATORY_CARE_PROVIDER_SITE_OTHER): Payer: Self-pay

## 2013-05-04 ENCOUNTER — Encounter: Payer: Self-pay | Admitting: Pulmonary Disease

## 2013-05-04 ENCOUNTER — Telehealth: Payer: Self-pay | Admitting: Pulmonary Disease

## 2013-05-04 VITALS — BP 130/84 | HR 61 | Temp 97.3°F | Ht 62.5 in | Wt 143.6 lb

## 2013-05-04 DIAGNOSIS — E039 Hypothyroidism, unspecified: Secondary | ICD-10-CM

## 2013-05-04 DIAGNOSIS — I868 Varicose veins of other specified sites: Secondary | ICD-10-CM

## 2013-05-04 DIAGNOSIS — Z87891 Personal history of nicotine dependence: Secondary | ICD-10-CM

## 2013-05-04 DIAGNOSIS — J45909 Unspecified asthma, uncomplicated: Secondary | ICD-10-CM

## 2013-05-04 DIAGNOSIS — M949 Disorder of cartilage, unspecified: Secondary | ICD-10-CM

## 2013-05-04 DIAGNOSIS — E7800 Pure hypercholesterolemia, unspecified: Secondary | ICD-10-CM

## 2013-05-04 DIAGNOSIS — F411 Generalized anxiety disorder: Secondary | ICD-10-CM

## 2013-05-04 DIAGNOSIS — C4370 Malignant melanoma of unspecified lower limb, including hip: Secondary | ICD-10-CM

## 2013-05-04 DIAGNOSIS — K219 Gastro-esophageal reflux disease without esophagitis: Secondary | ICD-10-CM

## 2013-05-04 DIAGNOSIS — D126 Benign neoplasm of colon, unspecified: Secondary | ICD-10-CM

## 2013-05-04 DIAGNOSIS — M899 Disorder of bone, unspecified: Secondary | ICD-10-CM

## 2013-05-04 MED ORDER — LEVOTHYROXINE SODIUM 100 MCG PO TABS: 100.0000 ug | ORAL_TABLET | Freq: Every day | ORAL | Status: DC

## 2013-05-04 MED ORDER — CIMETIDINE 400 MG PO TABS: 400.0000 mg | ORAL_TABLET | Freq: Two times a day (BID) | ORAL | Status: DC

## 2013-05-04 NOTE — Telephone Encounter (Signed)
Called and spoke with pt and her questions have been answered about the breathing test and nothing further is needed.

## 2013-05-04 NOTE — Patient Instructions (Signed)
Today we updated your med list in our EPIC system...    Continue your current medications the same...    We refilled your meds per request...  Keep up the good work OFF of the cigarettes...  Today we did a pulmonary function test and a follow up CXR...    We will contact you w/ the results when available...   We will arrange for a f/u bone density test as well...  We reviewed your recent fasting blood work & gave you a copy for your records...  Call for any questions...  Let's plan a follow up visit in 1 yr to check your breathing etc..[name]

## 2013-05-04 NOTE — Progress Notes (Signed)
Subjective:     Patient ID: Ariel Holmes, female   DOB: February 18, 1940, 73 y.o.   MRN: [id number]  HPI 73 y/o WF here for a follow up visit... she has multiple medical problems as noted below...    ~  March 11, 2010:  she tripped 6/11 w/ fx 5th metatarsal req cast, boot, etc per DrRendall- now resolved... still smoking but down less than 1/2 ppd she says & denies smokers cough, sputum, SOB/ DOE, etc> denies CP, palpit, or cardiac problems- exerc by walking some she says... she reports prolapsed bladder per GYN... CXR- NAD, & Labs- stable w/ hi HDL.Marland KitchenMarland Kitchen OK Pneumovax & TDAP today.  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 She quit smoking 6/12 after making a contract w/ her uncle for $1000 per month for 107mo of not smoking!!! She is still chewing nicorette gum... She has been trying to control Chol on diet alone as she states "I can't take the --TORS"; she would be willing to try Zetia which her mother takes along w/ CoQ10... Other medical problems as listed below> her only prescription meds as Flonase & Levothyroid 130mcg/d...  CXR 3/13 showed heart is wnl, lungs clear x sl incr markings, NAD.[name]  LABS 3/13:  FLP- Improved on diet;  Chems- wnl;  CBC- wnl;  TSH=0.71 on Levothy100/d...  ~  April 28, 2012:  51yr ROV & Janica has had a difficult yr w/ husb illness but has received the most relief from a massage therapist- "it really helps"; still works in [employer]... We reviewed the following medical problems during today's office visit >>     AR, AB, ex-smoker> on OTC Antihist, Flonase, & Nicorette; she quit smoking 6/12 but still using Nicotine replacement...    MVP> on ASA81; she denies CP, palpit, SOB, edema, etc...    Ven Insuffic> she knows to  avoid sodium, elev legs, wear support hose...    Chol> on diet alone esp w/ her good HDL; Labs 3/14 showed TChol 228, TG 92, HDL 88, LDL 109    Hypothy> on Synthroid100; Labs showed TSH= 0.42; she remains clinically & biochem euthyroid...    GI- GERD, Polyps, Hems> stable w/o abd pain, dysphagia, n/v, c/d, blood seen; last colon 5/10 w/ divertics, 2 adenomas removed, f/u due 5/15...    Osteopenia> on Calcium, MVI, VitD2000; she stopped prev Fosamax Rx yrs ago- last BMD 2009 w/ Tscore -1.5 in [facility]...    Anxiety> under stress w/ husb illness but declines anxiolytic meds...    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 We reviewed prob list, meds, xrays and labs> see below for updates >> she had the 2013 Flu vaccine 10/13...  LABS 11/13:  Chems- wnl;  CBC- wnl...  CXR 3/14 showed normal heart size, calcif & ectasia of Ao, clear lungs, DJD/ sl scoliosis of spine/ osteopenia...  LABS 3/14:  FLP- not at goals on diet alone but hi HDL;  TSH=0.42;  VitD=60   ~  May 04, 2013:  Yearly ROV & Kirstin is stable, under a lot of stress w/ husb's long illness; she's noted that BPs have been elev occas at home & there  is a fam hx of HBP, BP here= 130/80- we discussed R&R, no salt, consider anxiolytic if nec...    AR, AB, ex-smoker> on OTC Antihist, Flonase, & Nicorette; she quit smoking 6/12 but still using Nicotine replacement...    MVP> on ASA81; she denies CP, palpit, SOB, edema, etc...    Ven Insuffic> she knows to avoid sodium, elev legs, wear support hose...    Chol> on diet alone esp w/ her good HDL; Labs 3/15 showed TChol 209, TG 78, HDL 83, LDL 105    Hypothy> on Synthroid100; Labs 3/15 showed TSH= 0.43; she remains clinically & biochem euthyroid...    GI- GERD, Polyps, Hems> stable w/o abd pain, dysphagia, n/v, c/d, blood seen; last colon 5/10 w/ divertics, 2 adenomas removed, f/u due 5/15...    Osteopenia> on  Calcium, MVI, VitD2000; she stopped prev Fosamax Rx yrs ago- last BMD 2009 w/ Tscore -1.5 in [facility]...    Anxiety> under stress w/ husb illness but declines anxiolytic meds...    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 We reviewed prob list, meds, xrays and labs> see below for updates >>   CXR 3/15 showed norm heart size, clear lungs, mild DJD spine, no lesions/ NAD...  PFT 3/15 showed FVC=2.00 (76%), FEV1=1.56 (77%), %1sec=78%, mid-flows=75% predicted... Interpret: No signif obstruction, can't r/o mild restriction w/o lung vol measurement- SMN  BMD 3/15 showed TScore -0.5 in Spine and -1.4 in [facility]; Rec to continue Calcium, MVI, VitD & exercise...          PROBLEM LIST:    ALLERGIC RHINITIS - uses FLONASE Prn...  ASTHMATIC BRONCHITIS, ACUTE (ICD-466.0) - no recent bronchitic exac... not on regular meds. ~  CXR 12/09 showed hyperinflation & incr markings, spondylosis in TSpine... 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 ~  CXR 3/13 showed heart size is wnl, lungs clear x sl incr markings, NAD. ~  CXR 3/14 showed normal heart size, calcif & ectasia of Ao, clear lungs, DJD/ sl scoliosis of spine/ osteopenia. ~  CXR 3/15 showed norm heart size, clear lungs, mild DJD spine, no lesions/ NAD.  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  MITRAL VALVE PROLAPSE, HX OF (ICD-V12.50) - on ASA 81mg /d... hx mild MVP on 2DEcho in 1995 (no signif MR)... asymptomatic without CP or palpit... 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 she knows to avoid sodium, elevate legs, wear support hose, etc...  HYPERCHOLESTEROLEMIA, BORDERLINE, WITH HIGH HDL (ICD-272.0) ~  FLP 8/08 showed TChol 211, TG 47, HDL 97, LDL 101 ~  FLP 12/09 showed TChol  190, TG 65, HDL 81, LDL 96 ~  FLP 1/11 showed TChol 206, TG 52, HDL 89, LDL 105 ~  FLP 1/12 showed TChol 221, TG 62, HDL 89, LDL 117 ~  FLP 3/13 on diet alone showed TChol 205, TG 77, HDL 86, LDL 95... Keep up the good work. ~  [place] 3/14 on diet alone showed TChol 228, TG 92, HDL 88, LDL 109 ~  FLP 3/15 on diet alone showed TChol 209, TG 78, HDL 83, LDL 105   HYPOTHYROIDISM (ICD-244.9) - on LEVOTHYROID 180mcg/d... ~  labs 12/09 showed TSH= 0.44 ~  labs 1/11 showed TSH= 0.66 ~  labs 1/12 showed TSH= 0.85 ~  Labs 3/13 on Levo100 showed TSH= 0.71 ~  Labs 3/14 on Levo100 showed TSH=0.42 ~  Labs 3/15 on Levo100 showed TSH= 0.43  ESOPHAGEAL REFLUX (ICD-530.81) - she uses Cimetadine 300mg  Prn for "Stomach" & requests refill from [place]...  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 ~  colonoscopy 4/00 by DrDBrodie showed several 6-69mm polyps= adenomatous... ~  colonoscopy 4/05 by DrDBrodie showed only melanosis and hems... ~  colonoscopy 5/10 DrDBrodie showed mild divertics, 2 cecal polyps- tubular adenoma, f/u 63yrs.  OSTEOPENIA (ICD-733.90) - on calcium, MVI, Vit D 2000 u daily... she stopped her prev Fosamax rx on her own in 2011. ~  BMD here 8/07 showed TScores -1.2 in Spine, and -1.8 in left FemNeck. ~  labs 8/08 showed Vit D level= 38 ~  BMD here 9/09 showed TScores -0.8 in Spine, and -1.5 in left FemNeck. ~  Labs 1/11 showed Vit D level = 51 ~  Labs 3/14 showed Vit D level = 60 ~  Labs 3/15 showed Vit D level = 79  ANXIETY (ICD-300.00) - not currently on meds.  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 ~  She reports on-going follow up from [place] ?every 3-32mo? ~  11/13: she saw DrEnnever for follow up> on observation, doing well w/o recurrence...  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  Health Maintenance: ~  neg Mamogram at [facility]- followed yearly... due for GYN check by [facility] & she will call. ~  Immunizations:  she gets the yearly seasonal Flu vaccines... she received Shingles vaccine 2/09 at [facility]... had PNEUMOVAX in 2002 & 1/12 (age 32)... given TDAP 1/12.   Past Surgical History  Procedure Laterality Date  . Breast surgery      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 Patient does not remember exact date of surgery.  . Tonsillectomy    . Colonoscopy    . Cesarean section  1965  . Melanoma excision  12/31/2010    Procedure: MELANOMA EXCISION;  Surgeon: Zenovia Jarred, MD;  Location: [place];  Service: General;  Laterality: Left;  wide excision melanoma left thigh, excision lesion left thigh    Outpatient Encounter Prescriptions as of 05/04/2013  Medication Sig  . acyclovir ointment (ZOVIRAX) 5 % Apply 1 application topically as needed.   [name] aspirin 81 MG tablet Take 81 mg by mouth daily.    . Calcium Carbonate-Vitamin D (CALCIUM + D PO) Take by mouth daily.    . Cholecalciferol (VITAMIN D) 2000 UNITS CAPS Take 1 capsule by mouth daily.  . cimetidine (TAGAMET) 400 MG tablet Take 1 tablet (400 mg total) by mouth 2 (two) times daily.  . fluticasone (FLONASE) 50 MCG/ACT nasal spray Place 2 sprays into the nose as needed.  [name] levothyroxine (SYNTHROID, LEVOTHROID) 100 MCG tablet Take 1 tablet (100 mcg total) by mouth daily.  . LUTEIN PO Take 1 tablet by mouth daily.  . Multiple Vitamin (MULTIVITAMIN) tablet Take 1 tablet by mouth daily.    . nicotine polacrilex (NICORETTE) 2 MG gum Take 2 mg by mouth as needed. Not smoking  . [DISCONTINUED] oseltamivir (TAMIFLU) 75 MG capsule  Take 1 capsule (75 mg total) by mouth 2 (two) times daily.    Allergies   Allergen Reactions  . Codeine Nausea And Vomiting    Current Medications, Allergies, Past Medical History, Past Surgical History, Family History, and Social History were reviewed in [employer] record.   Review of Systems         The patient complains of fatigue, dyspnea on exertion, cough, gas/bloating, incontinence, joint pain, arthritis, and anxiety.  The patient denies fever, chills, sweats, anorexia, weakness, malaise, weight loss, sleep disorder, blurring, diplopia, eye irritation, eye discharge, vision loss, eye pain, photophobia, earache, ear discharge, tinnitus, decreased hearing, nasal congestion, nosebleeds, sore throat, hoarseness, chest pain, palpitations, syncope, orthopnea, PND, peripheral edema, dyspnea at rest, excessive sputum, hemoptysis, wheezing, pleurisy, nausea, vomiting, diarrhea, constipation, change in bowel habits, abdominal pain, melena, hematochezia, jaundice, indigestion/heartburn, dysphagia, odynophagia, dysuria, hematuria, urinary frequency, urinary hesitancy, nocturia, back pain, joint swelling, muscle cramps, muscle weakness, stiffness, sciatica, restless legs, leg pain at night, leg pain with exertion, rash, itching, dryness, suspicious lesions, paralysis, paresthesias, seizures, tremors, vertigo, transient blindness, frequent falls, frequent headaches, difficulty walking, depression, memory loss, confusion, cold intolerance, heat intolerance, polydipsia, polyphagia, polyuria, unusual weight change, abnormal bruising, bleeding, enlarged lymph nodes, urticaria, allergic rash, hay fever, and recurrent infections.     Objective:   Physical Exam     WD, WN, 73 y/o WF in NAD...  GENERAL:  Alert & oriented; pleasant & cooperative... HEENT:  [place]/AT, EOM-wnl, PERRLA, EACs-clear, TMs-wnl, NOSE-clear, THROAT-clear & wnl. NECK:  Supple w/ fairROM; no JVD; normal carotid impulses w/o bruits; no thyromegaly or nodules palpated; no  lymphadenopathy. CHEST:  Clear to P & A; without wheezes/ rales/ or rhonchi. HEART:  Regular Rhythm; without murmurs/ rubs/ or gallops. ABDOMEN:  Soft & nontender; normal bowel sounds; no organomegaly or masses detected. EXT: without deformities or arthritic changes; mild varicose veins/ venous insuffic/ tr edema. Scar on inner left thigh from melanoma surg, no palp adenopathy... NEURO:  CN's intact; motor testing normal; sensory testing normal; gait normal & balance OK. DERM:  No lesions noted; no rash etc...  RADIOLOGY DATA:  Reviewed in the EPIC EMR & discussed w/ the patient...   LABORATORY DATA:  Reviewed in the EPIC EMR & discussed w/ the patient...    Assessment:      HxMELANOMA>  Bx- DrHouston (Clark level 2), wide excision- DrThompson (no residual tumor), oncology consult- DrEnnever (IA-no adjuvant therapy)>  She is in good hands & favorable prognosis...  AB, ex-smoker>  She quit smoking 6/12 on a bet w/ her uncle; & she has remain quit!  No resp exac & stable, no meds needed...  Hx MVP>  On ASA, she is asymptomatic w/o CP, palpit, etc...  CHOL>  FLP is much improved on diet & she will not need meds at this time...  Hypothyroid>  Stable on Levothy100 per day, continue same...  GERD>  She uses OTC Tagamet as needed (her preference)...  Divertics, Colon Polyps>  She is up to date on colon screening w/ f/u due 5/15...  Osteopenia>  On Calcium, MVI, Vit D; she stopped her Fosamax on her own in 2011...  Anxiety>  She does not want anxiolytic therapy...     Plan:     Patient's Medications  New Prescriptions   No medications on file  Previous Medications   ACYCLOVIR OINTMENT (ZOVIRAX) 5 %    Apply 1 application topically as needed.    ASPIRIN 81 MG TABLET  Take 81 mg by mouth daily.     CALCIUM CARBONATE-VITAMIN D (CALCIUM + D PO)    Take by mouth daily.     CHOLECALCIFEROL (VITAMIN D) 2000 UNITS CAPS    Take 1 capsule by mouth daily.   FLUTICASONE (FLONASE) 50  MCG/ACT NASAL SPRAY    Place 2 sprays into the nose as needed.   LUTEIN PO    Take 1 tablet by mouth daily.   MULTIPLE VITAMIN (MULTIVITAMIN) TABLET    Take 1 tablet by mouth daily.     NICOTINE POLACRILEX (NICORETTE) 2 MG GUM    Take 2 mg by mouth as needed. Not smoking  Modified Medications   Modified Medication Previous Medication   CIMETIDINE (TAGAMET) 400 MG TABLET cimetidine (TAGAMET) 400 MG tablet      Take 1 tablet (400 mg total) by mouth 2 (two) times daily.    Take 1 tablet (400 mg total) by mouth 2 (two) times daily.   LEVOTHYROXINE (SYNTHROID, LEVOTHROID) 100 MCG TABLET levothyroxine (SYNTHROID, LEVOTHROID) 100 MCG tablet      Take 1 tablet (100 mcg total) by mouth daily.    Take 1 tablet (100 mcg total) by mouth daily.  Discontinued Medications   OSELTAMIVIR (TAMIFLU) 75 MG CAPSULE    Take 1 capsule (75 mg total) by mouth 2 (two) times daily.

## 2013-05-07 ENCOUNTER — Ambulatory Visit (INDEPENDENT_AMBULATORY_CARE_PROVIDER_SITE_OTHER)
Admission: RE | Admit: 2013-05-07 | Discharge: 2013-05-07 | Disposition: A | Discharge status: Home / Self Care | Payer: Medicare Other | Source: Ambulatory Visit | Attending: Pulmonary Disease | Admitting: Pulmonary Disease

## 2013-05-07 DIAGNOSIS — M899 Disorder of bone, unspecified: Secondary | ICD-10-CM

## 2013-05-07 DIAGNOSIS — M949 Disorder of cartilage, unspecified: Principal | ICD-10-CM

## 2013-05-08 ENCOUNTER — Encounter: Payer: Self-pay | Admitting: Obstetrics and Gynecology

## 2013-05-08 ENCOUNTER — Encounter: Payer: Self-pay | Admitting: Internal Medicine

## 2013-05-09 ENCOUNTER — Ambulatory Visit: Payer: Self-pay | Admitting: Obstetrics and Gynecology

## 2013-05-09 ENCOUNTER — Ambulatory Visit: Payer: Self-pay | Admitting: Gynecology

## 2013-05-11 ENCOUNTER — Telehealth: Payer: Self-pay | Admitting: Pulmonary Disease

## 2013-05-11 NOTE — Telephone Encounter (Signed)
Notes Recorded by Noralee Space, MD on 05/09/2013 at 5:41 PM Please notify patient>  CXR is ok w/ normal heart size, essentially clear lungs, mild DJD TSpine, NAD...      Pt aware of results.

## 2013-05-20 ENCOUNTER — Other Ambulatory Visit: Payer: Self-pay | Admitting: Pulmonary Disease

## 2013-06-12 ENCOUNTER — Ambulatory Visit: Payer: Self-pay | Admitting: Gynecology

## 2013-06-13 ENCOUNTER — Ambulatory Visit (INDEPENDENT_AMBULATORY_CARE_PROVIDER_SITE_OTHER): Payer: Medicare Other | Admitting: Gynecology

## 2013-06-13 ENCOUNTER — Encounter: Payer: Self-pay | Admitting: Gynecology

## 2013-06-13 VITALS — BP 140/72 | HR 76 | Resp 20 | Ht 62.0 in | Wt 142.0 lb

## 2013-06-13 DIAGNOSIS — Z0000 Encounter for general adult medical examination without abnormal findings: Secondary | ICD-10-CM

## 2013-06-13 DIAGNOSIS — N8111 Cystocele, midline: Secondary | ICD-10-CM

## 2013-06-13 DIAGNOSIS — N8110 Cystocele, unspecified: Secondary | ICD-10-CM | POA: Insufficient documentation

## 2013-06-13 DIAGNOSIS — IMO0002 Reserved for concepts with insufficient information to code with codable children: Secondary | ICD-10-CM | POA: Insufficient documentation

## 2013-06-13 DIAGNOSIS — N814 Uterovaginal prolapse, unspecified: Secondary | ICD-10-CM

## 2013-06-13 DIAGNOSIS — Z01419 Encounter for gynecological examination (general) (routine) without abnormal findings: Secondary | ICD-10-CM

## 2013-06-13 DIAGNOSIS — Z87891 Personal history of nicotine dependence: Secondary | ICD-10-CM

## 2013-06-13 LAB — POCT URINALYSIS DIPSTICK
BILIRUBIN UA: NEGATIVE
Blood, UA: NEGATIVE
Glucose, UA: NEGATIVE
KETONES UA: NEGATIVE
NITRITE UA: NEGATIVE
Protein, UA: NEGATIVE
Urobilinogen, UA: NEGATIVE
pH, UA: 5

## 2013-06-13 NOTE — Addendum Note (Signed)
Addended by: Elroy Channel on: 06/13/2013 06:08 PM   Modules accepted: Orders

## 2013-06-13 NOTE — Progress Notes (Signed)
73 y.o. Married Caucasian female   G1P1001 here for annual exam.  She does not report hot flashes, does not have night sweats, does have vaginal dryness.  She is using lubricant.  She does not report post-menopasual bleeding.  Pt has uterine prolapse, worse at night, limits lifting, can empty bladder well but can have hesitancy mostly at night, can sleep thru the night.  Sees Dr Marin Olp for f/u melanoma.  No LMP recorded. Patient is postmenopausal.          Sexually active: no  The current method of family planning is none.    Exercising: yes  Walking Last pap: 03/2010 - normal Abnormal PAP: No Mammogram: 12/15/2012 BI-RADS 2: Benign  BSE: No Colonoscopy: 06/2008 DEXA: 05/22/2013 - Mild Osteopenia Alcohol: No  Tobacco: No  Health Maintenance  Topic Date Due  . Influenza Vaccine  09/15/2013  . Mammogram  12/16/2014  . Colonoscopy  06/25/2015  . Tetanus/tdap  03/11/2020  . Pneumococcal Polysaccharide Vaccine Age 72 And Over  Completed  . Zostavax  Completed    Family History  Problem Relation Age of Onset  . Diabetes Paternal Grandmother   . Diabetes Paternal Grandfather   . Hypertension Mother   . Hypertension Maternal Grandfather   . Stroke Father   . Congestive Heart Failure Mother   . CVA Maternal Grandfather   . Heart attack Father 49  . Thyroid disease Mother     Patient Active Problem List   Diagnosis Date Noted  . Ex-cigarette smoker 05/04/2013  . Asthmatic bronchitis 05/04/2013  . 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 ESOPHAGEAL REFLUX 03/24/2010  . VARICOSE VEIN 05/31/2008  . HYPERCHOLESTEROLEMIA, BORDERLINE, WITH HIGH HDL 01/18/2008  . OSTEOPENIA 01/18/2008  . COLONIC POLYPS 01/17/2008  . HYPOTHYROIDISM 01/17/2008  . ANXIETY 01/17/2008  . MITRAL VALVE PROLAPSE, HX OF 01/17/2008    Past Medical History  Diagnosis Date  . MVP (mitral valve prolapse)   . Varicose vein   . Hypercholesterolemia   . Hypothyroidism   . Hx of colonic polyps   . Osteopenia   .  COPD (chronic obstructive pulmonary disease)   . Melanoma     lt upper thigh  . 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 Melanoma of thigh 2014    Left     Past Surgical History  Procedure Laterality Date  . Breast surgery      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 Patient does not remember exact date of surgery.  . Tonsillectomy    . Colonoscopy    . Cesarean section  1965  . Melanoma excision  12/31/2010    Procedure: MELANOMA EXCISION;  Surgeon: Zenovia Jarred, MD;  Location: [place];  Service: General;  Laterality: Left;  wide excision melanoma left thigh, excision lesion left thigh  . Other surgical history Right     Surgical repair of architectual distortion Right Breast- Hyperplasia    Allergies: Codeine  Current Outpatient Prescriptions  Medication Sig Dispense Refill  . aspirin 81 MG tablet Take 81 mg by mouth daily.        . Calcium Carbonate-Vitamin D (CALCIUM + D PO) Take by mouth daily.        . Cholecalciferol (VITAMIN D) 2000 UNITS CAPS Take 1 capsule by mouth daily.      . cimetidine (TAGAMET) 400 MG tablet Take 400 mg by mouth as needed.      . fluticasone (FLONASE) 50 MCG/ACT nasal spray Place 2 sprays into the nose as  needed.  16 g  5  . levothyroxine (SYNTHROID, LEVOTHROID) 100 MCG tablet Take 1 tablet (100 mcg total) by mouth daily.  90 tablet  3  . LUTEIN PO Take 1 tablet by mouth daily.      . Multiple Vitamin (MULTIVITAMIN) tablet Take 1 tablet by mouth daily.        . nicotine polacrilex (NICORETTE) 2 MG gum Take 2 mg by mouth as needed. Not smoking       No current facility-administered medications for this visit.    ROS: Pertinent items are noted in HPI.  Exam:    BP 140/72  Pulse 76  Resp 20  Ht 5\' 2"  (1.575 m)  Wt 142 lb (64.411 kg)  BMI 25.97 kg/m2 Weight change: @WEIGHTCHANGE @ Last 3 height recordings:  Ht Readings from Last 3 Encounters:  06/13/13 5\' 2"  (1.575 m)  05/04/13 5' 2.5" (1.588 m)  06/19/12 5\' 2"  (1.575 m)    General appearance: alert, cooperative and appears stated age Head: Normocephalic, without obvious abnormality, atraumatic Neck: no adenopathy, no carotid bruit, no JVD, supple, symmetrical, trachea midline and thyroid not enlarged, symmetric, no tenderness/mass/nodules Lungs: clear to auscultation bilaterally Breasts: normal appearance, no masses or tenderness Heart: regular rate and rhythm, S1, S2 normal, no murmur, click, rub or gallop Abdomen: soft, non-tender; bowel sounds normal; no masses,  no organomegaly Extremities: extremities normal, atraumatic, no cyanosis or edema Skin: Skin color, texture, turgor normal. No rashes or lesions Lymph nodes: Cervical, supraclavicular, and axillary nodes normal. no inguinal nodes palpated Neurologic: Grossly normal   Pelvic: External genitalia:  no lesions              Urethra: normal appearing urethra with no masses, tenderness or lesions              Bartholins and Skenes: normal                 Vagina: atrophic, cystocele              Cervix: normal appearance              Pap taken: no        Bimanual Exam:  Uterus:  Atrophic, mobile                                       Adnexa:    no masses                                      Rectovaginal: Confirms                                      Anus:  normal sphincter tone, no lesions  A: well woman Uterine prolapse with cystocele     P: cystocele with prolapse, pt able to reduce with ease, skin in tact, recommend lubricating tissue, advised to call if cannot easily reduce.  Not interested in pessary Mammogram Recommend limiting nicotine F/u dermatology/oncology re melanoma Discussed PAP guideline changes, importance of weight bearing exercises, calcium, vit D and balanced diet.  An After Visit Summary was printed and given to the patient.

## 2013-06-15 LAB — URINE CULTURE
COLONY COUNT: NO GROWTH
Organism ID, Bacteria: NO GROWTH

## 2013-06-29 ENCOUNTER — Ambulatory Visit (AMBULATORY_SURGERY_CENTER): Payer: Self-pay | Admitting: *Deleted

## 2013-06-29 VITALS — Ht 62.0 in | Wt 143.0 lb

## 2013-06-29 DIAGNOSIS — Z860100 Personal history of colon polyps, unspecified: Secondary | ICD-10-CM

## 2013-06-29 MED ORDER — MOVIPREP 100 G PO SOLR: 1.0000 | Freq: Once | ORAL | Status: DC

## 2013-06-29 NOTE — Progress Notes (Signed)
Denies allergies to eggs or soy products. Denies complications with sedation or anesthesia. Denies O2 use. Denies use of diet or weight loss medications.  Emmi instructions given for colonoscopy.  

## 2013-07-04 ENCOUNTER — Encounter: Payer: Self-pay | Admitting: Internal Medicine

## 2013-07-13 ENCOUNTER — Encounter: Payer: Self-pay | Admitting: Internal Medicine

## 2013-07-13 ENCOUNTER — Ambulatory Visit (AMBULATORY_SURGERY_CENTER): Payer: Medicare Other | Admitting: Internal Medicine

## 2013-07-13 VITALS — BP 134/68 | HR 59 | Temp 96.5°F | Resp 25 | Ht 62.0 in | Wt 143.0 lb

## 2013-07-13 DIAGNOSIS — Z860100 Personal history of colon polyps, unspecified: Secondary | ICD-10-CM

## 2013-07-13 MED ORDER — SODIUM CHLORIDE 0.9 % IV SOLN: 500.0000 mL | INTRAVENOUS | Status: DC

## 2013-07-13 NOTE — Patient Instructions (Signed)
YOU HAD AN ENDOSCOPIC PROCEDURE TODAY AT THE [facility]: Refer to the procedure report that was given to you for any specific questions about what was found during the examination.  If the procedure report does not answer your questions, please call your gastroenterologist to clarify.  If you requested that your care partner not be given the details of your procedure findings, then the procedure report has been included in a sealed envelope for you to review at your convenience later.  YOU SHOULD EXPECT: Some feelings of bloating in the abdomen. Passage of more gas than usual.  Walking can help get rid of the air that was put into your GI tract during the procedure and reduce the bloating. If you had a lower endoscopy (such as a colonoscopy or flexible sigmoidoscopy) you may notice spotting of blood in your stool or on the toilet paper. If you underwent a bowel prep for your procedure, then you may not have a normal bowel movement for a few days.  DIET: Your first meal following the procedure should be a light meal and then it is ok to progress to your normal diet.  A half-sandwich or bowl of soup is an example of a good first meal.  Heavy or fried foods are harder to digest and may make you feel nauseous or bloated.  Likewise meals heavy in dairy and vegetables can cause extra gas to form and this can also increase the bloating.  Drink plenty of fluids but you should avoid alcoholic beverages for 24 hours.  ACTIVITY: Your care partner should take you home directly after the procedure.  You should plan to take it easy, moving slowly for the rest of the day.  You can resume normal activity the day after the procedure however you should NOT DRIVE or use heavy machinery for 24 hours (because of the sedation medicines used during the test).    SYMPTOMS TO REPORT IMMEDIATELY: A gastroenterologist can be reached at any hour.  During normal business hours, 8:30 AM to 5:00 PM Monday through Friday,  call [phone].  After hours and on weekends, please call the GI answering service at [phone] who will take a message and have the physician on call contact you.   Following lower endoscopy (colonoscopy or flexible sigmoidoscopy):  Excessive amounts of blood in the stool  Significant tenderness or worsening of abdominal pains  Swelling of the abdomen that is new, acute  Fever of 100F or higher    FOLLOW UP: If any biopsies were taken you will be contacted by phone or by letter within the next 1-3 weeks.  Call your gastroenterologist if you have not heard about the biopsies in 3 weeks.  Our staff will call the home number listed on your records the next business day following your procedure to check on you and address any questions or concerns that you may have at that time regarding the information given to you following your procedure. This is a courtesy call and so if there is no answer at the home number and we have not heard from you through the emergency physician on call, we will assume that you have returned to your regular daily activities without incident.  SIGNATURES/CONFIDENTIALITY: You and/or your care partner have signed paperwork which will be entered into your electronic medical record.  These signatures attest to the fact that that the information above on your After Visit Summary has been reviewed and is understood.  Full responsibility of the confidentiality   of this discharge information lies with you and/or your care-partner.  INFORMATION ON DIVERTICULOSIS AND HIGH FIBER DIET GIVEN TO YOU TODAY  STOP HERBAL LAXATIVES

## 2013-07-13 NOTE — Op Note (Signed)
[facility]  [address] [place], [address]   COLONOSCOPY PROCEDURE REPORT  PATIENT: Ariel Holmes, Ariel Holmes  MR#: [id number] BIRTHDATE: 1940/03/24 , 59  yrs. old GENDER: Female ENDOSCOPIST: Lafayette Dragon, MD REFERRED YB:[facility] Lenna Gilford, M.D. PROCEDURE DATE:  07/13/2013 PROCEDURE:   Colonoscopy, screening First Screening Colonoscopy - Avg.  risk and is 50 yrs.  old or older - No.  Prior Negative Screening - Now for repeat screening. N/A  History of Adenoma - Now for follow-up colonoscopy & has been > or = to 3 yrs.  Yes hx of adenoma.  Has been 3 or more years since last colonoscopy.  Polyps Removed Today? No.  Recommend repeat exam, <10 yrs? No. ASA CLASS:   Class II INDICATIONS:prior colonoscopy 2000 and 2005. Tubular adenoma removed in May 2010. Colon cancer in uncle MEDICATIONS: MAC sedation, administered by CRNA, Fentanyl-Quick Pick, and propofol (Diprivan) 200mg  IV  DESCRIPTION OF PROCEDURE:   After the risks benefits and alternatives of the procedure were thoroughly explained, informed consent was obtained.  A digital rectal exam revealed no abnormalities of the rectum.   The LB [id number]  endoscope was introduced through the anus and advanced to the cecum, which was identified by both the appendix and ileocecal valve. No adverse events experienced.   The quality of the prep was good, using MoviPrep  The instrument was then slowly withdrawn as the colon was fully examined.      COLON FINDINGS: Melanosis coli.   There was moderate diverticulosis noted in the sigmoid colon with associated muscular hypertrophy, tortuosity and angulation.  Retroflexed views revealed no abnormalities. The time to cecum=5 minutes 44 seconds.  Withdrawal time=6 minutes 43 seconds.  The scope was withdrawn and the procedure completed. COMPLICATIONS: There were no complications.  ENDOSCOPIC IMPRESSION: 1.   Melanosis coli 2.   There was moderate diverticulosis noted in the  sigmoid colon  RECOMMENDATIONS: Stop herbal laxatives High-fiber diet Fiber supplements recall colonoscopy in 10 years  eSigned:  Lafayette Dragon, MD 07/13/2013 11:38 AM   cc:   PATIENT NAME:  Ariel Holmes, Ariel Holmes MR#: [id number]

## 2013-07-13 NOTE — Progress Notes (Signed)
Procedure ends, to recovery, report given and VSS. 

## 2013-07-16 ENCOUNTER — Telehealth: Payer: Self-pay | Admitting: *Deleted

## 2013-07-16 ENCOUNTER — Telehealth: Payer: Self-pay | Admitting: Hematology & Oncology

## 2013-07-16 NOTE — Telephone Encounter (Signed)
Left message with husband that we called for f/u . Husband says pt . Did not have any problems after colonoscopy and is doing well.

## 2013-07-16 NOTE — Telephone Encounter (Signed)
Pt called moved 6-5 to 6-22

## 2013-07-20 ENCOUNTER — Other Ambulatory Visit: Payer: Medicare Other | Admitting: Lab

## 2013-07-20 ENCOUNTER — Ambulatory Visit: Payer: Medicare Other | Admitting: Hematology & Oncology

## 2013-08-06 ENCOUNTER — Other Ambulatory Visit (HOSPITAL_BASED_OUTPATIENT_CLINIC_OR_DEPARTMENT_OTHER): Payer: Medicare Other | Admitting: Lab

## 2013-08-06 ENCOUNTER — Encounter: Payer: Self-pay | Admitting: Hematology & Oncology

## 2013-08-06 ENCOUNTER — Ambulatory Visit (HOSPITAL_BASED_OUTPATIENT_CLINIC_OR_DEPARTMENT_OTHER): Payer: Medicare Other | Admitting: Hematology & Oncology

## 2013-08-06 VITALS — BP 135/61 | HR 75 | Temp 97.9°F | Resp 14 | Ht 62.0 in | Wt 143.0 lb

## 2013-08-06 DIAGNOSIS — C4371 Malignant melanoma of right lower limb, including hip: Secondary | ICD-10-CM

## 2013-08-06 DIAGNOSIS — C4370 Malignant melanoma of unspecified lower limb, including hip: Secondary | ICD-10-CM

## 2013-08-06 DIAGNOSIS — C4372 Malignant melanoma of left lower limb, including hip: Secondary | ICD-10-CM

## 2013-08-06 LAB — CMP (CANCER CENTER ONLY)
ALT: 25 U/L (ref 10–47)
AST: 29 U/L (ref 11–38)
Albumin: 3.5 g/dL (ref 3.3–5.5)
Alkaline Phosphatase: 80 U/L (ref 26–84)
BILIRUBIN TOTAL: 0.7 mg/dL (ref 0.20–1.60)
BUN, Bld: 15 mg/dL (ref 7–22)
CALCIUM: 9.6 mg/dL (ref 8.0–10.3)
CO2: 28 meq/L (ref 18–33)
Chloride: 103 mEq/L (ref 98–108)
Creat: 0.6 mg/dl (ref 0.6–1.2)
Glucose, Bld: 108 mg/dL (ref 73–118)
Potassium: 3.7 mEq/L (ref 3.3–4.7)
SODIUM: 142 meq/L (ref 128–145)
TOTAL PROTEIN: 7 g/dL (ref 6.4–8.1)

## 2013-08-06 LAB — CBC WITH DIFFERENTIAL (CANCER CENTER ONLY)
BASO#: 0 10*3/uL (ref 0.0–0.2)
BASO%: 0.4 % (ref 0.0–2.0)
EOS%: 1.1 % (ref 0.0–7.0)
Eosinophils Absolute: 0.1 10*3/uL (ref 0.0–0.5)
HCT: 37.4 % (ref 34.8–46.6)
HEMOGLOBIN: 12.7 g/dL (ref 11.6–15.9)
LYMPH#: 5.7 10*3/uL — ABNORMAL HIGH (ref 0.9–3.3)
LYMPH%: 50.1 % — AB (ref 14.0–48.0)
MCH: 32 pg (ref 26.0–34.0)
MCHC: 34 g/dL (ref 32.0–36.0)
MCV: 94 fL (ref 81–101)
MONO#: 1.1 10*3/uL — ABNORMAL HIGH (ref 0.1–0.9)
MONO%: 9.4 % (ref 0.0–13.0)
NEUT%: 39 % — ABNORMAL LOW (ref 39.6–80.0)
NEUTROS ABS: 4.4 10*3/uL (ref 1.5–6.5)
Platelets: 251 10*3/uL (ref 145–400)
RBC: 3.97 10*6/uL (ref 3.70–5.32)
RDW: 14.1 % (ref 11.1–15.7)
WBC: 11.3 10*3/uL — AB (ref 3.9–10.0)

## 2013-08-06 LAB — LACTATE DEHYDROGENASE: LDH: 144 U/L (ref 94–250)

## 2013-08-06 NOTE — Progress Notes (Signed)
Hematology and Oncology Follow Up Visit  Ariel Holmes [id number] 1940-09-25 72 y.o. 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  Current Therapy:    Observation     Interim History:  Ms.  Holmes is in for followup. The see her every 6 months. There is his last saw her, she colonoscopy done. There is a left okay. Her she's had no problems with the left leg. There's been no swelling. Present no cough. She's had no abdominal pain. There's been no fever. She's had no rashes.  She does have her mammograms yearly.  Medications: Current outpatient prescriptions:aspirin 81 MG tablet, Take 81 mg by mouth daily.  , Disp: , Rfl: ;  Calcium Carbonate-Vitamin D (CALCIUM + D PO), Take by mouth daily.  , Disp: , Rfl: ;  Cholecalciferol (VITAMIN D) 2000 UNITS CAPS, Take 1 capsule by mouth daily., Disp: , Rfl: ;  fluticasone (FLONASE) 50 MCG/ACT nasal spray, Place 2 sprays into the nose as needed., Disp: 16 g, Rfl: 5 levothyroxine (SYNTHROID, LEVOTHROID) 100 MCG tablet, Take 1 tablet (100 mcg total) by mouth daily., Disp: 90 tablet, Rfl: 3;  LUTEIN PO, Take 1 tablet by mouth daily., Disp: , Rfl: ;  Multiple Vitamin (MULTIVITAMIN) tablet, Take 1 tablet by mouth daily.  , Disp: , Rfl: ;  nicotine polacrilex (NICORETTE) 2 MG gum, Take 2 mg by mouth as needed. Not smoking, Disp: , Rfl:  cimetidine (TAGAMET) 400 MG tablet, Take 400 mg by mouth as needed., Disp: , Rfl:   Allergies:  Allergies  Allergen Reactions  . Codeine Nausea And Vomiting    Past Medical History, Surgical history, Social history, and Family History were reviewed and updated.  Review of Systems: As above  Physical Exam:  height is 5\' 2"  (1.575 m) and weight is 143 lb (64.864 kg). Her oral temperature is 97.9 F (36.6 C). Her blood pressure is 135/61 and her pulse is 75. Her respiration is 14.   We'll do and well-nourished white female. There are no ocular or oral lesions. She is  no palpable cervical or supraclavicular lymph nodes. Lungs are clear. Cardiac exam regular in rhythm with no murmurs rubs or bruits. Abdomen is soft. She has good bowel sounds. There is no fluid wave. There is no palpable liver or spleen tip. Extremities shows a well-healed wide local excision scar on the inner upper left thigh. No swelling is noted in the left leg. There is no left inguinal adenopathy. Right leg is unremarkable. Skin exam shows no rashes ecchymosis or petechia.  Lab Results  Component Value Date   WBC 11.3* 08/06/2013   HGB 12.7 08/06/2013   HCT 37.4 08/06/2013   MCV 94 08/06/2013   PLT 251 08/06/2013     Chemistry      Component Value Date/Time   NA 142 08/06/2013 1428   NA 138 06/19/2012 0946   K 3.7 08/06/2013 1428   K 4.2 06/19/2012 0946   CL 103 08/06/2013 1428   CL 102 06/19/2012 0946   CO2 28 08/06/2013 1428   CO2 26 06/19/2012 0946   BUN 15 08/06/2013 1428   BUN 12 06/19/2012 0946   CREATININE 0.6 08/06/2013 1428   CREATININE 0.62 06/19/2012 0946      Component Value Date/Time   CALCIUM 9.6 08/06/2013 1428   CALCIUM 9.8 06/19/2012 0946   ALKPHOS 80 08/06/2013 1428   ALKPHOS 68 06/19/2012 0946   AST 29 08/06/2013 1428  AST 20 06/19/2012 0946   ALT 25 08/06/2013 1428   ALT 16 06/19/2012 0946   BILITOT 0.70 08/06/2013 1428   BILITOT 0.4 06/19/2012 0946         Impression and Plan: Ariel Holmes is 73 year old white female with a history of a lentigo maligna melanoma left thigh. She underwent surgery for this. This was back in November of 2012.  Again, she is doing well.  I don't see any skin lesions that look suspicious. She does see dermatology quite often.. We will plan to see her back in another 6 months.   [name], MD 6/22/20153:35 PM

## 2013-11-19 ENCOUNTER — Other Ambulatory Visit: Payer: Self-pay | Admitting: Pulmonary Disease

## 2013-11-21 ENCOUNTER — Telehealth: Payer: Self-pay | Admitting: Pulmonary Disease

## 2013-11-21 MED ORDER — AZITHROMYCIN 250 MG PO TABS: 250.0000 mg | ORAL_TABLET | ORAL | Status: DC

## 2013-11-21 MED ORDER — PREDNISONE (PAK) 5 MG PO TABS: ORAL_TABLET | ORAL | Status: DC

## 2013-11-21 NOTE — Telephone Encounter (Signed)
Patient c/o increased head congestion, sore throat, runny nose, cough, low grade fever (99.5) and sinus pressure. Denies chest congestion at this time-- pt states that mucus is stopping at lower throat area, has not made its way into chest.  Pt reports mucus has changed in color-- Ariel Holmes mucus today. Salt water nasal rinses and using Flonase-- no improvement Requests abx sent to [facility] Allergies  Allergen Reactions  . Codeine Nausea And Vomiting  Pt wanting to know if something can be called in-- pt states that she cannot afford to come to office visit Please advise Dr Ariel Holmes. Thanks.

## 2013-11-21 NOTE — Telephone Encounter (Signed)
Per SN---  zpak #1  Take as directed pred dosepak 5 mg   6 day pack mucinex 600 mg  2 po bid Increase fluids

## 2013-11-21 NOTE — Telephone Encounter (Signed)
Called and spoke with the pt and notified of recs per SN She verbalized understanding  Rxs were sent to pharm and pt to call back if not improving

## 2013-12-17 ENCOUNTER — Encounter: Payer: Self-pay | Admitting: Hematology & Oncology

## 2014-01-28 ENCOUNTER — Ambulatory Visit (HOSPITAL_BASED_OUTPATIENT_CLINIC_OR_DEPARTMENT_OTHER): Payer: Medicare Other | Admitting: Hematology & Oncology

## 2014-01-28 ENCOUNTER — Telehealth: Payer: Self-pay | Admitting: Hematology & Oncology

## 2014-01-28 ENCOUNTER — Other Ambulatory Visit (HOSPITAL_BASED_OUTPATIENT_CLINIC_OR_DEPARTMENT_OTHER): Payer: Self-pay | Admitting: Lab

## 2014-01-28 ENCOUNTER — Encounter: Payer: Self-pay | Admitting: Hematology & Oncology

## 2014-01-28 VITALS — BP 156/61 | HR 55 | Temp 97.6°F | Resp 14 | Ht 62.0 in | Wt 135.0 lb

## 2014-01-28 DIAGNOSIS — C4372 Malignant melanoma of left lower limb, including hip: Secondary | ICD-10-CM

## 2014-01-28 LAB — CBC WITH DIFFERENTIAL (CANCER CENTER ONLY)
BASO#: 0 10*3/uL (ref 0.0–0.2)
BASO%: 0.3 % (ref 0.0–2.0)
EOS%: 0.7 % (ref 0.0–7.0)
Eosinophils Absolute: 0.1 10*3/uL (ref 0.0–0.5)
HCT: 36.8 % (ref 34.8–46.6)
HGB: 12.1 g/dL (ref 11.6–15.9)
LYMPH#: 4.1 10*3/uL — AB (ref 0.9–3.3)
LYMPH%: 47.9 % (ref 14.0–48.0)
MCH: 31 pg (ref 26.0–34.0)
MCHC: 32.9 g/dL (ref 32.0–36.0)
MCV: 94 fL (ref 81–101)
MONO#: 1.1 10*3/uL — AB (ref 0.1–0.9)
MONO%: 12.1 % (ref 0.0–13.0)
NEUT#: 3.4 10*3/uL (ref 1.5–6.5)
NEUT%: 39 % — ABNORMAL LOW (ref 39.6–80.0)
Platelets: 263 10*3/uL (ref 145–400)
RBC: 3.9 10*6/uL (ref 3.70–5.32)
RDW: 14.5 % (ref 11.1–15.7)
WBC: 8.7 10*3/uL (ref 3.9–10.0)

## 2014-01-28 LAB — LACTATE DEHYDROGENASE: LDH: 141 U/L (ref 94–250)

## 2014-01-28 LAB — CMP (CANCER CENTER ONLY)
ALT(SGPT): 22 U/L (ref 10–47)
AST: 26 U/L (ref 11–38)
Albumin: 3.7 g/dL (ref 3.3–5.5)
Alkaline Phosphatase: 59 U/L (ref 26–84)
BUN, Bld: 14 mg/dL (ref 7–22)
CO2: 29 meq/L (ref 18–33)
Calcium: 9.7 mg/dL (ref 8.0–10.3)
Chloride: 104 meq/L (ref 98–108)
Creat: 0.7 mg/dL (ref 0.6–1.2)
Glucose, Bld: 88 mg/dL (ref 73–118)
Potassium: 4.1 meq/L (ref 3.3–4.7)
Sodium: 145 meq/L (ref 128–145)
Total Bilirubin: 0.6 mg/dL (ref 0.20–1.60)
Total Protein: 7.2 g/dL (ref 6.4–8.1)

## 2014-01-28 NOTE — Telephone Encounter (Signed)
Pt came up to the desk saying she was leaving that no body gave a rats you know what about her, that she has been here since 11:30 am. She did not want to reschedule and left. MD aware

## 2014-01-28 NOTE — Progress Notes (Signed)
i talked to her on the phone.  She had to leave due to her husband's illness.

## 2014-01-29 ENCOUNTER — Encounter: Payer: Self-pay | Admitting: Pulmonary Disease

## 2014-01-29 ENCOUNTER — Telehealth: Payer: Self-pay | Admitting: Hematology & Oncology

## 2014-01-29 NOTE — Telephone Encounter (Signed)
Mailed 01-2015 schedule

## 2014-05-22 ENCOUNTER — Telehealth: Payer: Self-pay | Admitting: Pulmonary Disease

## 2014-05-22 MED ORDER — FLUTICASONE PROPIONATE 50 MCG/ACT NA SUSP
2.0000 | NASAL | Status: DC | PRN
Start: 2014-05-22 — End: 2014-07-20

## 2014-05-22 NOTE — Telephone Encounter (Signed)
Rx has been sent in. Pt has upcoming appointment with SN. Pt is aware. Nothing further was needed.

## 2014-06-11 ENCOUNTER — Ambulatory Visit (INDEPENDENT_AMBULATORY_CARE_PROVIDER_SITE_OTHER)
Admission: RE | Admit: 2014-06-11 | Discharge: 2014-06-11 | Disposition: A | Discharge status: Home / Self Care | Payer: Medicare Other | Source: Ambulatory Visit | Attending: Pulmonary Disease | Admitting: Pulmonary Disease

## 2014-06-11 ENCOUNTER — Encounter: Payer: Self-pay | Admitting: Pulmonary Disease

## 2014-06-11 ENCOUNTER — Ambulatory Visit (INDEPENDENT_AMBULATORY_CARE_PROVIDER_SITE_OTHER): Payer: Medicare Other | Admitting: Pulmonary Disease

## 2014-06-11 ENCOUNTER — Other Ambulatory Visit (INDEPENDENT_AMBULATORY_CARE_PROVIDER_SITE_OTHER): Payer: Medicare Other

## 2014-06-11 VITALS — BP 150/80 | HR 71 | Temp 97.1°F | Ht 63.0 in | Wt 121.8 lb

## 2014-06-11 DIAGNOSIS — I8390 Asymptomatic varicose veins of unspecified lower extremity: Secondary | ICD-10-CM

## 2014-06-11 DIAGNOSIS — E039 Hypothyroidism, unspecified: Secondary | ICD-10-CM | POA: Diagnosis not present

## 2014-06-11 DIAGNOSIS — E7800 Pure hypercholesterolemia, unspecified: Secondary | ICD-10-CM

## 2014-06-11 DIAGNOSIS — C4372 Malignant melanoma of left lower limb, including hip: Secondary | ICD-10-CM

## 2014-06-11 DIAGNOSIS — M949 Disorder of cartilage, unspecified: Secondary | ICD-10-CM

## 2014-06-11 DIAGNOSIS — F411 Generalized anxiety disorder: Secondary | ICD-10-CM

## 2014-06-11 DIAGNOSIS — J4541 Moderate persistent asthma with (acute) exacerbation: Secondary | ICD-10-CM | POA: Diagnosis not present

## 2014-06-11 DIAGNOSIS — M899 Disorder of bone, unspecified: Secondary | ICD-10-CM

## 2014-06-11 DIAGNOSIS — Z87891 Personal history of nicotine dependence: Secondary | ICD-10-CM | POA: Diagnosis not present

## 2014-06-11 DIAGNOSIS — K219 Gastro-esophageal reflux disease without esophagitis: Secondary | ICD-10-CM

## 2014-06-11 DIAGNOSIS — I868 Varicose veins of other specified sites: Secondary | ICD-10-CM | POA: Diagnosis not present

## 2014-06-11 DIAGNOSIS — H9193 Unspecified hearing loss, bilateral: Secondary | ICD-10-CM

## 2014-06-11 DIAGNOSIS — D126 Benign neoplasm of colon, unspecified: Secondary | ICD-10-CM

## 2014-06-11 LAB — LIPID PANEL
Cholesterol: 196 mg/dL (ref 0–200)
HDL: 76.4 mg/dL (ref >39.00)
LDL Cholesterol: 101 mg/dL — ABNORMAL HIGH (ref 0–99)
NonHDL: 119.6
Total CHOL/HDL Ratio: 3
Triglycerides: 93 mg/dL (ref 0.0–149.0)
VLDL: 18.6 mg/dL (ref 0.0–40.0)

## 2014-06-11 LAB — TSH: TSH: 0.89 u[IU]/mL (ref 0.35–4.50)

## 2014-06-11 MED ORDER — AZITHROMYCIN 250 MG PO TABS: ORAL_TABLET | ORAL | Status: DC

## 2014-06-11 MED ORDER — METHYLPREDNISOLONE ACETATE 80 MG/ML IJ SUSP
80.0000 mg | Freq: Once | INTRAMUSCULAR | Status: AC
  Administered 2014-06-11: 80 mg via INTRAMUSCULAR

## 2014-06-11 MED ORDER — PREDNISONE 5 MG (21) PO TBPK: 5.0000 mg | ORAL_TABLET | Freq: Every day | ORAL | Status: DC

## 2014-06-11 NOTE — Progress Notes (Signed)
Subjective:     Patient ID: Ariel Holmes, female   DOB: 01/07/1941, 74 y.o.   MRN: [id number]  HPI 74 y/o WF here for a follow up visit... she has multiple medical problems as noted below...   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 She quit smoking 6/12 after making a contract w/ her uncle for $1000 per month for 40mo of not smoking!!! She is still chewing nicorette gum... She has been trying to control Chol on diet alone as she states "I can't take the --TORS"; she would be willing to try Zetia which her mother takes along w/ CoQ10... Other medical problems as listed below> her only prescription meds as Flonase & Levothyroid 166mcg/d...  CXR 3/13 showed heart is wnl, lungs clear x sl incr markings, NAD.[name]  LABS 3/13:  FLP- Improved on diet;  Chems- wnl;  CBC- wnl;  TSH=0.71 on Levothy100/d...  ~  April 28, 2012:  25yr ROV & Ariel Holmes has had a difficult yr w/ husb illness but has received the most relief from a massage therapist- "it really helps"; still works in [employer]... We reviewed the following medical problems during today's office visit >>     AR, AB, ex-smoker> on OTC Antihist, Flonase, & Nicorette; she quit smoking 6/12 but still using Nicotine replacement...    MVP> on ASA81; she denies CP, palpit, SOB, edema, etc...    Ven Insuffic> she knows to avoid sodium, elev legs, wear support hose...    Chol> on diet alone esp w/ her good HDL; Labs 3/14 showed TChol 228, TG 92, HDL 88, LDL 109    Hypothy> on Synthroid100; Labs showed TSH= 0.42; she remains clinically & biochem euthyroid...    GI- GERD, Polyps, Hems> stable w/o abd pain, dysphagia, n/v, c/d, blood seen; last colon 5/10 w/  divertics, 2 adenomas removed, f/u due 5/15...    Osteopenia> on Calcium, MVI, VitD2000; she stopped prev Fosamax Rx yrs ago- last BMD 2009 w/ Tscore -1.5 in [facility]...    Anxiety> under stress w/ husb illness but declines anxiolytic meds...    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 We reviewed prob list, meds, xrays and labs> see below for updates >> she had the 2013 Flu vaccine 10/13...  LABS 11/13:  Chems- wnl;  CBC- wnl...  CXR 3/14 showed normal heart size, calcif & ectasia of Ao, clear lungs, DJD/ sl scoliosis of spine/ osteopenia...  LABS 3/14:  FLP- not at goals on diet alone but hi HDL;  TSH=0.42;  VitD=60   ~  May 04, 2013:  Yearly ROV & [name] is stable, under a lot of stress w/ husb's long illness; she's noted that BPs have been elev occas at home & there is a fam hx of HBP, BP here= 130/80- we discussed R&R, no salt, consider anxiolytic if nec...    AR, AB, ex-smoker> on OTC Antihist, Flonase, & Nicorette; she quit smoking 6/12 but still using Nicotine replacement...    MVP> on ASA81; she denies CP, palpit, SOB, edema, etc...    Ven Insuffic> she knows to  avoid sodium, elev legs, wear support hose...    Chol> on diet alone esp w/ her good HDL; Labs 3/15 showed TChol 209, TG 78, HDL 83, LDL 105    Hypothy> on Synthroid100; Labs 3/15 showed TSH= 0.43; she remains clinically & biochem euthyroid...    GI- GERD, Polyps, Hems> stable w/o abd pain, dysphagia, n/v, c/d, blood seen; last colon 5/10 w/ divertics, 2 adenomas removed, f/u due 5/15...    Osteopenia> on Calcium, MVI, VitD2000; she stopped prev Fosamax Rx yrs ago- last BMD 2009 w/ Tscore -1.5 in [facility]...    Anxiety> under stress w/ husb illness but declines anxiolytic meds...    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 We reviewed prob list, meds, xrays and  labs> see below for updates >>   CXR 3/15 showed norm heart size, clear lungs, mild DJD spine, no lesions/ NAD...  PFT 3/15 showed FVC=2.00 (76%), FEV1=1.56 (77%), %1sec=78%, mid-flows=75% predicted... Interpret: No signif obstruction, can't r/o mild restriction w/o lung vol measurement- SMN  BMD 3/15 showed TScore -0.5 in Spine and -1.4 in [facility]; Rec to continue Calcium, MVI, VitD & exercise...  ~  June 11, 2014:  50mo ROV & Ariel Holmes has had a rough time since Ariel Holmes passed away 03/16/14; she works Scientist, research (physical sciences) for [employer] & that has helped keeping her busy,,, she is c/o some nasal congestion, drainage, & blowing beige mucus; draining to her chest but denies hemooptysis, SOB, CP, f/c/s; she is fatigued and achey- using Flonase and Saline... She quit smoking 4 yrs ago & still chews nicorette gum- we discussed weaning this down already! We reviewed the following medical problems during today's office visit >>     AR, AB, ex-smoker> on OTC Antihist, Flonase, & Nicorette; she quit smoking 6/12 but still using Nicotine replacement & we discussed weaning this "medication"...    MVP> on ASA81; she denies CP, palpit, SOB, edema, etc; she is active & doing satis...    Ven Insuffic> she knows to avoid sodium, elev legs, wear support hose...    Chol> on diet alone esp w/ her good HDL; Labs 4/16 showed TChol 196, TG 93, HDL 76, LDL 101    Hypothy> on Synthroid100; Labs 4/16 showed TSH= 0.89; she remains clinically & biochem euthyroid...    GI- GERD, Polyps, Hems> stable w/o abd pain, dysphagia, n/v, c/d, blood seen; last colon 5/15 by DrDBrodie showed divertics in sigmoid, melanosis, no recurrent polyps (she rec f/u 10 yrs).    Osteopenia> on Calcium, MVI, VitD2000; she stopped prev Fosamax Rx yrs ago- last BMD 2009 w/ Tscore -1.5 in [facility]...    Anxiety> husb Ariel Holmes passed away 16-Mar-2014 after a long illness, she is doing satis & declines anxiolytic meds...    Melanoma> Lentigo maligna melanoma  removed from left inner thigh 11/12; followed by DrHouston & DrEnnever on observation & no known recurrence... We reviewed prob list, meds, xrays and labs> see below for updates >>   CXR 05/2014 showed norm heart size, clear lungs, NAD...   LABS 4/16:  FLP- at goals on diet;  TSH=.89;  Prev Chems/ CBC 01/2014 were wnl...           PROBLEM LIST:    ALLERGIC RHINITIS - uses FLONASE Prn...  ASTHMATIC BRONCHITIS, ACUTE (ICD-466.0) - no recent bronchitic exac... not on regular meds. ~  CXR 12/09 showed hyperinflation & incr markings, spondylosis in TSpine... 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 ~  CXR 3/13 showed heart size is wnl, lungs clear x sl incr markings, NAD. ~  CXR 3/14 showed normal heart size, calcif & ectasia of Ao, clear lungs, DJD/ sl scoliosis of spine/ osteopenia. ~  CXR 3/15 showed norm heart size, clear lungs, mild DJD spine, no lesions/ NAD. ~  PTF 3/15 showed FVC=2.00 (76%), FEV1=1.56 (77%), %1sec=78%, mid-flows=75% predicted... Interpret: No signif obstruction, can't r/o mild restriction w/o lung vol measurement ~  CXR 4/16 showed norm heart size, clear lungs, NAD.  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  MITRAL VALVE PROLAPSE, HX OF (ICD-V12.50) - on ASA 81mg /d... hx mild MVP on 2DEcho in 1995 (no signif MR)... asymptomatic without CP or palpit... 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 she knows to avoid sodium, elevate legs, wear support hose, etc...  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 Keep up the good work. ~  [place] 3/14 on diet alone showed TChol 228, TG 92, HDL 88, LDL 109 ~  FLP 3/15 on diet alone showed TChol 209, TG 78, HDL 83, LDL 105  ~  FLP 4/16 on diet alone showed TChol 196, TG 93, HDL 76, LDL 101   HYPOTHYROIDISM (ICD-244.9) - on LEVOTHYROID 167mcg/d... ~  labs 12/09 showed TSH= 0.44 ~  labs 1/11 showed TSH= 0.66 ~  labs 1/12 showed TSH= 0.85 ~  Labs 3/13 on Levo100 showed TSH= 0.71 ~  Labs 3/14 on Levo100 showed TSH=0.42 ~  Labs 3/15 on Levo100 showed TSH= 0.43 ~  Labs 4/16 on Levo100 showing TSH= 0.89  ESOPHAGEAL REFLUX (ICD-530.81) - she uses Cimetadine 300mg  Prn for "Stomach" & requests refill from [place]...  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 ~  colonoscopy 4/00 by DrDBrodie showed several 6-65mm polyps= adenomatous... ~  colonoscopy 4/05 by DrDBrodie showed only melanosis and hems... ~  colonoscopy 5/10 DrDBrodie showed mild divertics, 2 cecal polyps- tubular adenoma, f/u 71yrs. ~  colonoscopy 5/15 DrDBrodie showed divertics in sigmoid, melanosis, no recurrent polyps (she rec f/u 10 yrs).  OSTEOPENIA (ICD-733.90) - on calcium, MVI, Vit D 2000 u daily... she stopped her prev Fosamax rx on her own in 2011. ~  BMD here 8/07 showed TScores -1.2 in Spine, and -1.8 in left FemNeck. ~  labs 8/08 showed Vit D level= 38 ~  BMD here 9/09 showed TScores -0.8 in Spine, and -1.5 in left FemNeck. ~  Labs 1/11 showed Vit D level = 51 ~  Labs 3/14 showed Vit D level = 60 ~  Labs 3/15 showed Vit D level = 79 ~  BMD 3/15 showed Tscore -1.4 & rec to continue MVI, VitD, wt bearing exercises.  ANXIETY (ICD-300.00) - not currently on meds.  MELANOMA >> Diagnosed w/ Lentigo Maligna Melanoma on inner left thigh 11/12 by Brunetta Jeans;  Wider excision w/ neg path 11/12 by DrThompson;  Oncology review by DrEnnever &  no plans for adjuvant therapy... ~  She reports on-going follow up from [place] ?every 3-83mo? ~   11/13: she saw DrEnnever for follow up> on observation, doing well w/o recurrence...  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  Health Maintenance: ~  neg Mamogram at [facility]- followed yearly... due for GYN check by [facility] & she will call. ~  Immunizations:  she gets the yearly seasonal Flu vaccines... she received Shingles vaccine 2/09 at [facility]... had PNEUMOVAX in 2002 & 1/12 (age 7)... given TDAP 1/12.   Past Surgical History  Procedure Laterality Date  . Breast surgery      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 Patient does not remember exact date of surgery.  . Tonsillectomy    . Colonoscopy    . Cesarean section  1965  . Melanoma excision  12/31/2010    Procedure: MELANOMA EXCISION;  Surgeon: Zenovia Jarred, MD;  Location: [place];  Service: General;  Laterality: Left;  wide excision melanoma left thigh, excision lesion left thigh  . Other surgical history Right     Surgical repair of architectual distortion Right Breast- Hyperplasia    Outpatient Encounter Prescriptions as of 06/11/2014  Medication Sig  . aspirin 81 MG tablet Take 81 mg by mouth daily.    . Calcium Carbonate-Vitamin D (CALCIUM + D PO) Take by mouth daily.    . Cholecalciferol (VITAMIN D) 2000 UNITS CAPS Take 1 capsule by mouth daily.  . cimetidine (TAGAMET) 400 MG tablet Take 400 mg by mouth as needed.  . fluticasone (FLONASE) 50 MCG/ACT nasal spray Place 2 sprays into both nostrils as needed.  [name] levothyroxine (SYNTHROID, LEVOTHROID) 100 MCG tablet TAKE 1 TABLET (100 MCG TOTAL) BY MOUTH DAILY.  [name] LUTEIN PO Take 1 tablet by mouth daily.  . Multiple Vitamin (MULTIVITAMIN) tablet Take 1 tablet by mouth daily.    . nicotine polacrilex (NICORETTE) 2 MG gum Take 2 mg by mouth as needed. Not smoking    Allergies   Allergen Reactions  . Codeine Nausea And Vomiting    Current Medications, Allergies, Past Medical History, Past Surgical History, Family History, and Social History were reviewed in [employer] record.   Review of Systems         The patient complains of fatigue, dyspnea on exertion, cough, gas/bloating, incontinence, joint pain, arthritis, and anxiety.  The patient denies fever, chills, sweats, anorexia, weakness, malaise, weight loss, sleep disorder, blurring, diplopia, eye irritation, eye discharge, vision loss, eye pain, photophobia, earache, ear discharge, tinnitus, decreased hearing, nasal congestion, nosebleeds, sore throat, hoarseness, chest pain, palpitations, syncope, orthopnea, PND, peripheral edema, dyspnea at rest, excessive sputum, hemoptysis, wheezing, pleurisy, nausea, vomiting, diarrhea, constipation, change in bowel habits, abdominal pain, melena, hematochezia, jaundice, indigestion/heartburn, dysphagia, odynophagia, dysuria, hematuria, urinary frequency, urinary hesitancy, nocturia, back pain, joint swelling, muscle cramps, muscle weakness, stiffness, sciatica, restless legs, leg pain at night, leg pain with exertion, rash, itching, dryness, suspicious lesions, paralysis, paresthesias, seizures, tremors, vertigo, transient blindness, frequent falls, frequent headaches, difficulty walking, depression, memory loss, confusion, cold intolerance, heat intolerance, polydipsia, polyphagia, polyuria, unusual weight change, abnormal bruising, bleeding, enlarged lymph nodes, urticaria, allergic rash, hay fever, and recurrent infections.     Objective:   Physical Exam     WD, WN, 74 y/o WF in NAD.Marland KitchenMarland Kitchen  GENERAL:  Alert & oriented; pleasant & cooperative... HEENT:  [place]/AT, EOM-wnl, PERRLA, EACs-clear, TMs-wnl, NOSE-clear, THROAT-clear & wnl. NECK:  Supple w/ fairROM; no JVD; normal carotid impulses w/o bruits; no thyromegaly or nodules palpated; no  lymphadenopathy. CHEST:  Clear to P & A; without wheezes/ rales/ or rhonchi. HEART:  Regular Rhythm; without murmurs/ rubs/ or gallops. ABDOMEN:  Soft & nontender; normal bowel sounds; no organomegaly or masses detected. EXT: without deformities or arthritic changes; mild varicose veins/ venous insuffic/ tr edema. Scar on inner left thigh from melanoma surg, no palp adenopathy... NEURO:  CN's intact; motor testing normal; sensory testing normal; gait normal & balance OK. DERM:  No lesions noted; no rash etc...  RADIOLOGY DATA:  Reviewed in the EPIC EMR & discussed w/ the patient...   LABORATORY DATA:  Reviewed in the EPIC EMR & discussed w/ the patient...    Assessment:      AB, ex-smoker>  She quit smoking 6/12 on a bet w/ her uncle; & she has remain quit!  No resp exac & stable, no meds needed...  Hx MVP>  On ASA, she is asymptomatic w/o CP, palpit, etc...  CHOL>  FLP is much improved on diet & she will not need meds at this time...  Hypothyroid>  Stable on Levothy100 per day, continue same...  GERD>  She uses OTC Tagamet as needed (her preference)...  Divertics, Colon Polyps>  She is up to date on colon screening w/ no polyps seen 5/15...  Osteopenia>  On Calcium, MVI, Vit D; she stopped her Fosamax on her own in 2011...  Anxiety>  She does not want anxiolytic therapy...  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     Plan:     Patient's Medications  New Prescriptions   AZITHROMYCIN (ZITHROMAX Z-PAK) 250 MG TABLET    Use as directed   PREDNISONE (STERAPRED UNI-PAK 21 TAB) 5 MG (21) TBPK TABLET    Take 1 tablet (5 mg total) by mouth daily.  Previous Medications   ASPIRIN 81 MG TABLET    Take 81 mg by mouth daily.     CALCIUM CARBONATE-VITAMIN D (CALCIUM + D PO)    Take by mouth daily.     CHOLECALCIFEROL (VITAMIN D) 2000  UNITS CAPS    Take 1 capsule by mouth daily.   CIMETIDINE (TAGAMET) 400 MG TABLET    Take 400 mg by mouth as needed.   FLUTICASONE (FLONASE) 50 MCG/ACT NASAL SPRAY    Place 2 sprays into both nostrils as needed.   LEVOTHYROXINE (SYNTHROID, LEVOTHROID) 100 MCG TABLET    TAKE 1 TABLET (100 MCG TOTAL) BY MOUTH DAILY.   LUTEIN PO    Take 1 tablet by mouth daily.   MULTIPLE VITAMIN (MULTIVITAMIN) TABLET    Take 1 tablet by mouth daily.     NICOTINE POLACRILEX (NICORETTE) 2 MG GUM    Take 2 mg by mouth as needed. Not smoking  Modified Medications   No medications on file  Discontinued Medications   No medications on file

## 2014-06-11 NOTE — Patient Instructions (Signed)
Today we updated your med list in our EPIC system...    Continue your current medications the same...  Today we did your follow up FASTING labs and a CXR...    We will contact you w/ the results when available...   We will arrange for an ENT referral to check you hearing & removed the cerumen impactions...  For your sinus>>    We gave you a Depo shot & a Prednisone Dosepak...    We also wrote for a Zithromax ZPak- take as directed on the pack...  Call for any questions...  Let's plan a follow up visit in 67yr, sooner if needed for any problems.Marland KitchenMarland Kitchen

## 2014-06-12 ENCOUNTER — Other Ambulatory Visit: Payer: Self-pay | Admitting: Pulmonary Disease

## 2014-06-12 MED ORDER — LEVOTHYROXINE SODIUM 100 MCG PO TABS: ORAL_TABLET | ORAL | Status: DC

## 2014-06-12 NOTE — Telephone Encounter (Signed)
Spoke with pt this morning and she requested a refill on her synthroid. Per SN, ok to refill. Refill request sent electronically to pharmacy with additional refill

## 2014-07-20 ENCOUNTER — Other Ambulatory Visit: Payer: Self-pay | Admitting: Internal Medicine

## 2014-07-20 ENCOUNTER — Other Ambulatory Visit: Payer: Self-pay | Admitting: Pulmonary Disease

## 2014-07-28 ENCOUNTER — Other Ambulatory Visit: Payer: Self-pay | Admitting: Pulmonary Disease

## 2014-09-17 ENCOUNTER — Telehealth: Payer: Self-pay | Admitting: Pulmonary Disease

## 2014-09-17 MED ORDER — CIPROFLOXACIN HCL 250 MG PO TABS: 250.0000 mg | ORAL_TABLET | Freq: Two times a day (BID) | ORAL | Status: DC

## 2014-09-17 NOTE — Telephone Encounter (Signed)
I called made pt aware of recs. RX sent in. Nothing further needed

## 2014-09-17 NOTE — Telephone Encounter (Signed)
Spoke with the pt  She c/o low back pain, aches, fatigue and difficulty urinating- "comes out really slow" She also said she feels like her "heart is racing"- just started today  She denies any CP or other symptoms  Req to be seen but there are no openings today  Allergies  Allergen Reactions  . Codeine Nausea And Vomiting    Please advise thanks!

## 2014-09-17 NOTE — Telephone Encounter (Signed)
Per SN,  Please call in cipro 250mg  #14 with instructions of 1 PO BID until gone

## 2014-09-20 ENCOUNTER — Telehealth: Payer: Self-pay | Admitting: Obstetrics & Gynecology

## 2014-09-20 NOTE — Telephone Encounter (Signed)
Patient has a prolapse uterus and wants to talk with the nurse

## 2014-09-20 NOTE — Telephone Encounter (Signed)
Spoke with patient. She is in the car, driving. She states that she has a prolapsed uterus and that "I have dealt with it all these years but now I think it is getting worse." Patient reports over the last two months, her symptoms have increased and reports having to "push it in more and more." She states she is having more difficulties with initiating her urine flow as well. Patient states she is able to void without pain, but feels she cannot get flow started. Patient denies dysuria and is able to void at this time.   Scheduled office visit for Monday 09/23/14 at 1000 with Dr. Quincy Simmonds for evaluation.  Advised patient if any increase in symptoms or if cannot empty bladder or develops fever, chills, back pain, nausea, vomiting or any concerns to seek care at closest emergency department. Patient verbalized understanding of instructions.   Patient is prior patient of Dr. Joan Flores and Dr. Charlies Constable.   Routing to Dr. Quincy Simmonds to review.

## 2014-09-20 NOTE — Telephone Encounter (Signed)
Reviewed with Dr. Sabra Heck (Dr. Quincy Simmonds unavailable at this time) and patient okay to wait for Monday morning office visit as scheduled.

## 2014-09-20 NOTE — Telephone Encounter (Signed)
Thank you for the note.  I will be happy to see the patient on Monday, 09/23/14.  Encounter closed.

## 2014-09-23 ENCOUNTER — Encounter: Payer: Self-pay | Admitting: Obstetrics and Gynecology

## 2014-09-23 ENCOUNTER — Ambulatory Visit (INDEPENDENT_AMBULATORY_CARE_PROVIDER_SITE_OTHER): Payer: Medicare Other | Admitting: Obstetrics and Gynecology

## 2014-09-23 VITALS — BP 146/72 | HR 64 | Resp 18 | Ht 63.0 in | Wt 118.0 lb

## 2014-09-23 DIAGNOSIS — N393 Stress incontinence (female) (male): Secondary | ICD-10-CM

## 2014-09-23 DIAGNOSIS — R3915 Urgency of urination: Secondary | ICD-10-CM | POA: Diagnosis not present

## 2014-09-23 DIAGNOSIS — N813 Complete uterovaginal prolapse: Secondary | ICD-10-CM | POA: Diagnosis not present

## 2014-09-23 LAB — POCT URINALYSIS DIPSTICK
Bilirubin, UA: NEGATIVE
Blood, UA: NEGATIVE
Glucose, UA: NEGATIVE
KETONES UA: NEGATIVE
Leukocytes, UA: NEGATIVE
NITRITE UA: NEGATIVE
PROTEIN UA: NEGATIVE
UROBILINOGEN UA: NEGATIVE
pH, UA: 5

## 2014-09-23 NOTE — Progress Notes (Signed)
GYNECOLOGY  VISIT   HPI: 74 y.o.   Married  Caucasian  female   G1P1001 with No LMP recorded. Patient is postmenopausal.   here for Consult Prolapse and urgency to urinate.   Feels like her uterine prolapse is worsening.  Protrudes more with walking.  Pushes it back up but it will not stay up for long.   Voids well during the day.  Late afternoon, has to make more effort to initiate voiding.  Some urgency to void.  Leaks urine with cough and laugh.   No hematuria, UTIs, or renal stones.   No problems controlling gas.  No constipation or fecal incontinence.   No prior tx of prolapse/incontinence.  Does not use a pessary.  No prior pelvic surgery other than Cesarean Section and  dilation and curettage years ago.  Husband deceased in Apr 01, 2014.  CHF and diabetes.  Patient not sexually active.   Has lost about 27 pounds with death of husband.   Had episode of loss of balance with walking recently.   Works and is active.  Works at [employer], Photographer.   Urine negative today.   GYNECOLOGIC HISTORY: No LMP recorded. Patient is postmenopausal. Contraception:Post Menopausal  Menopausal hormone therapy: None Last mammogram: 12/17/13 BIRADS2:Benign  Last pap smear: 03/2010 Normal         OB History    Gravida Para Term Preterm AB TAB SAB Ectopic Multiple Living   1 1 1       1          Patient Active Problem List   Diagnosis Date Noted  . 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 Cystocele 06/13/2013  . Ex-cigarette smoker 05/04/2013  . Asthmatic bronchitis 05/04/2013  . 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 ESOPHAGEAL REFLUX 03/24/2010  . 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 HYPERCHOLESTEROLEMIA, BORDERLINE, WITH HIGH HDL 01/18/2008  . Disorder of bone and cartilage 01/18/2008  . COLONIC POLYPS 01/17/2008  . Hypothyroidism 01/17/2008  . Anxiety state 01/17/2008  . MITRAL VALVE PROLAPSE, HX OF 01/17/2008    Past Medical History  Diagnosis Date  . MVP (mitral valve  prolapse)   . Varicose vein   . Hypercholesterolemia   . Hypothyroidism   . Hx of colonic polyps   . Osteopenia   . COPD (chronic obstructive pulmonary disease)   . Melanoma     lt upper thigh  . 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 Melanoma of thigh 2014    Left   . Prolapsed uterus     Past Surgical History  Procedure Laterality Date  . Breast surgery      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 Patient does not remember exact date of surgery.  . Tonsillectomy    . Colonoscopy    . Cesarean section  1965  . Melanoma excision  12/31/2010    Procedure: MELANOMA EXCISION;  Surgeon: Zenovia Jarred, MD;  Location: [facility];  Service: General;  Laterality: Left;  wide excision melanoma left thigh, excision lesion left thigh  . Other surgical history Right     Surgical repair of architectual distortion Right Breast- Hyperplasia    Current Outpatient Prescriptions  Medication Sig Dispense Refill  . aspirin 81 MG tablet Take 81 mg by mouth daily.      . Calcium Carbonate-Vitamin D (CALCIUM + D PO) Take by mouth daily.      . Cholecalciferol (VITAMIN D) 2000 UNITS CAPS Take 1 capsule by mouth daily.    . cimetidine (TAGAMET)  400 MG tablet Take 400 mg by mouth as needed.    . fluticasone (FLONASE) 50 MCG/ACT nasal spray PLACE 2 SPRAYS INTO BOTH NOSTRILS AS NEEDED. 16 g 1  . levothyroxine (SYNTHROID, LEVOTHROID) 100 MCG tablet TAKE 1 TABLET (100 MCG TOTAL) BY MOUTH DAILY. 90 tablet 1  . LUTEIN PO Take 1 tablet by mouth daily.    . Multiple Vitamin (MULTIVITAMIN) tablet Take 1 tablet by mouth daily.      . nicotine polacrilex (NICORETTE) 2 MG gum Take 2 mg by mouth as needed. Not smoking     No current facility-administered medications for this visit.     ALLERGIES: Codeine  Family History  Problem Relation Age of Onset  . Diabetes Paternal Grandmother   . Diabetes Paternal Grandfather   . Hypertension Mother   . Congestive Heart Failure Mother   . Thyroid  disease Mother   . Hypertension Maternal Grandfather   . CVA Maternal Grandfather   . Stroke Father   . Heart attack Father 11  . Esophageal cancer Neg Hx   . Rectal cancer Neg Hx   . Stomach cancer Neg Hx   . Colon cancer Neg Hx     History   Social History  . Marital Status: Married    Spouse Name: fred Cisek  . Number of Children: N/A  . Years of Education: N/A   Occupational History  . Not on file.   Social History Main Topics  . Smoking status: Former Smoker -- 1.00 packs/day for 50 years    Types: Cigarettes    Start date: 03/08/1960    Quit date: 07/23/2010  . Smokeless tobacco: Never Used     Comment: quit smoking 3 years ago  . Alcohol Use: No  . Drug Use: No  . Sexual Activity: No   Other Topics Concern  . Not on file   Social History Narrative    ROS:  Pertinent items are noted in HPI.  PHYSICAL EXAMINATION:    BP 146/72 mmHg  Pulse 64  Resp 18  Ht 5\' 3"  (1.6 m)  Wt 118 lb (53.524 kg)  BMI 20.91 kg/m2    General appearance: alert, cooperative and appears stated age   Pelvic: External genitalia:  no lesions              Urethra:  normal appearing urethra with no masses, tenderness or lesions              Bartholins and Skenes: normal                 Vagina: normal appearing vagina with normal color and discharge, no lesions.  Complete uterovaginal prolapse.               Cervix: no lesions              Bimanual Exam:  Uterus:  normal size, contour, position, consistency, mobility, non-tender              Adnexa: normal adnexa and no mass, fullness, tenderness              Rectovaginal: Yes.  .  Confirms.              Anus:  normal sphincter tone, no lesions  Chaperone was present for exam.  ASSESSMENT  Complete uterovaginal prolapse. Genuine stress incontinence.  Hx C/S. Recent loss of balance.   PLAN  Counseled regarding prolapse and incontinence - etiologies and treatment including physical therapy, pessary  use and surgery including  hysterectomy and reconstruction.  Patient elects for pessary fitting.  ACOG handouts on prolapse and incontinence and surgical tx thereof.  Avoid heavy lifting and straining.  I recommend patient follow up with PCP regarding loss of balance.    An After Visit Summary was printed and given to the patient.  ___25___ minutes face to face time of which over 50% was spent in counseling.

## 2014-10-14 ENCOUNTER — Ambulatory Visit (INDEPENDENT_AMBULATORY_CARE_PROVIDER_SITE_OTHER): Payer: Medicare Other | Admitting: Obstetrics and Gynecology

## 2014-10-14 ENCOUNTER — Encounter: Payer: Self-pay | Admitting: Obstetrics and Gynecology

## 2014-10-14 VITALS — BP 148/80 | HR 80 | Ht 63.0 in | Wt 118.4 lb

## 2014-10-14 DIAGNOSIS — N813 Complete uterovaginal prolapse: Secondary | ICD-10-CM

## 2014-10-14 DIAGNOSIS — N393 Stress incontinence (female) (male): Secondary | ICD-10-CM

## 2014-10-14 NOTE — Progress Notes (Signed)
GYNECOLOGY  VISIT   HPI: 74 y.o.   Married  Caucasian  female   G1P1001 with Patient's last menstrual period was 02/16/1988 (approximate).   here for pessary fitting   Has 3 needs for today: 1 - Wants physical therapy for her bladder.   2 - Wants to understand how to clean pessary.   3 - Wants evaluation of a skin area on her bottom.  This is an unchanged area.  Patient notices it with washing.   GYNECOLOGIC HISTORY: Patient's last menstrual period was 02/16/1988 (approximate). Contraception: Post - Menopausal  Menopausal hormone therapy: None Last mammogram: 12/17/13 BIRADS2: Benign  Last pap smear: 03/2010 normal        OB History    Gravida Para Term Preterm AB TAB SAB Ectopic Multiple Living   1 1 1       1          Patient Active Problem List   Diagnosis Date Noted  . 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 Cystocele 06/13/2013  . Ex-cigarette smoker 05/04/2013  . Asthmatic bronchitis 05/04/2013  . 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 ESOPHAGEAL REFLUX 03/24/2010  . 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 HYPERCHOLESTEROLEMIA, BORDERLINE, WITH HIGH HDL 01/18/2008  . Disorder of bone and cartilage 01/18/2008  . COLONIC POLYPS 01/17/2008  . Hypothyroidism 01/17/2008  . Anxiety state 01/17/2008  . MITRAL VALVE PROLAPSE, HX OF 01/17/2008    Past Medical History  Diagnosis Date  . MVP (mitral valve prolapse)   . Varicose vein   . Hypercholesterolemia   . Hypothyroidism   . Hx of colonic polyps   . Osteopenia   . COPD (chronic obstructive pulmonary disease)   . Melanoma     lt upper thigh  . 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 Melanoma of thigh 2014    Left   . Prolapsed uterus     Past Surgical History  Procedure Laterality Date  . Breast surgery      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 Patient does not remember exact date of surgery.  . Tonsillectomy    . Colonoscopy    . Cesarean section  1965  . Melanoma excision  12/31/2010    Procedure: MELANOMA EXCISION;  Surgeon: Zenovia Jarred, MD;  Location: [facility];  Service: General;  Laterality: Left;  wide excision melanoma left thigh, excision lesion left thigh  . Other surgical history Right     Surgical repair of architectual distortion Right Breast- Hyperplasia    Current Outpatient Prescriptions  Medication Sig Dispense Refill  . aspirin 81 MG tablet Take 81 mg by mouth daily.      . Calcium Carbonate-Vitamin D (CALCIUM + D PO) Take by mouth daily.      . Cholecalciferol (VITAMIN D) 2000 UNITS CAPS Take 1 capsule by mouth daily.    . cimetidine (TAGAMET) 400 MG tablet Take 400 mg by mouth as needed.    . fluticasone (FLONASE) 50 MCG/ACT nasal spray PLACE 2 SPRAYS INTO BOTH NOSTRILS AS NEEDED. 16 g 1  . levothyroxine (SYNTHROID, LEVOTHROID) 100 MCG tablet TAKE 1 TABLET (100 MCG TOTAL) BY MOUTH DAILY. 90 tablet 1  . LUTEIN PO Take 1 tablet by mouth daily.    . Multiple Vitamin (MULTIVITAMIN) tablet Take 1 tablet by mouth daily.      . nicotine polacrilex (NICORETTE) 2 MG gum Take 2 mg by mouth as needed. Not smoking     No current facility-administered medications for this visit.  ALLERGIES: Codeine  Family History  Problem Relation Age of Onset  . Diabetes Paternal Grandmother   . Diabetes Paternal Grandfather   . Hypertension Mother   . Congestive Heart Failure Mother   . Thyroid disease Mother   . Hypertension Maternal Grandfather   . CVA Maternal Grandfather   . Stroke Father   . Heart attack Father 68  . Esophageal cancer Neg Hx   . Rectal cancer Neg Hx   . Stomach cancer Neg Hx   . Colon cancer Neg Hx     Social History   Social History  . Marital Status: Married    Spouse Name: fred Standish  . Number of Children: N/A  . Years of Education: N/A   Occupational History  . Not on file.   Social History Main Topics  . Smoking status: Former Smoker -- 1.00 packs/day for 50 years    Types: Cigarettes    Start date: 03/08/1960    Quit date: 07/23/2010  . Smokeless  tobacco: Never Used     Comment: quit smoking 3 years ago  . Alcohol Use: No  . Drug Use: No  . Sexual Activity: No   Other Topics Concern  . Not on file   Social History Narrative    ROS:  Pertinent items are noted in HPI.  PHYSICAL EXAMINATION:    BP 148/80 mmHg  Pulse 80  Ht 5\' 3"  (1.6 m)  Wt 118 lb 6.4 oz (53.706 kg)  BMI 20.98 kg/m2  LMP 02/16/1988 (Approximate)    General appearance: alert, cooperative and appears stated age   Pelvic: External genitalia: raised skin colored 4 mm area of the right labia majora.              Urethra:  normal appearing urethra with no masses, tenderness or lesions              Bartholins and Skenes: normal                 Vagina: normal appearing vagina with normal color and discharge, no lesions              Cervix: no lesions      Bimanual Exam:  Uterus:  normal size, contour, position, consistency, mobility, non-tender              Adnexa: normal adnexa and no mass, fullness, tenderness            Pessary fitting.  #3 ring with support very comfortable for patient. Able to do maneuvers and maintains pessary.  #4 ring with support caused suprapubic pressure and smallest incontinence dish (was a #2?) uncomfortable for patient.   Chaperone was present for exam.  ASSESSMENT  Complete uterovaginal prolapse. Genuine stress incontinence.  Normopigmented raised area of vulva. Unchanged.  PLAN   Will order #3 ring with support pessary.  Discussed pessary care.  Discussed the potential for increased incontinence with pessary use due to reduction of the prolapse.  Patient understands and accepts this. Referral to [facility] for physical therapy.  No biopsy needed of vulvar area at this time.   An After Visit Summary was printed and given to the patient.  __15____ minutes face to face time of which over 50% was spent in counseling.

## 2014-10-18 ENCOUNTER — Encounter: Payer: Self-pay | Admitting: Obstetrics and Gynecology

## 2014-10-18 ENCOUNTER — Ambulatory Visit (INDEPENDENT_AMBULATORY_CARE_PROVIDER_SITE_OTHER): Payer: Medicare Other | Admitting: Obstetrics and Gynecology

## 2014-10-18 VITALS — BP 146/80 | HR 64 | Resp 16 | Ht 63.0 in | Wt 118.0 lb

## 2014-10-18 DIAGNOSIS — N8110 Cystocele, unspecified: Secondary | ICD-10-CM

## 2014-10-18 DIAGNOSIS — IMO0002 Reserved for concepts with insufficient information to code with codable children: Secondary | ICD-10-CM

## 2014-10-18 DIAGNOSIS — N813 Complete uterovaginal prolapse: Secondary | ICD-10-CM | POA: Diagnosis not present

## 2014-10-18 NOTE — Progress Notes (Signed)
GYNECOLOGY  VISIT   HPI: 74 y.o.   Married  Caucasian  female   G1P1001 with Patient's last menstrual period was 02/16/1988 (approximate).   here for Pessary Insertion     Hx of precancerous lesion of the right breast.   GYNECOLOGIC HISTORY: Patient's last menstrual period was 02/16/1988 (approximate). Contraception:Post Menopausal  Menopausal hormone therapy: None Last mammogram: 12/2013 BIRADS2:benign  Last pap smear: 2012- Normal         OB History    Gravida Para Term Preterm AB TAB SAB Ectopic Multiple Living   1 1 1       1          Patient Active Problem List   Diagnosis Date Noted  . 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 Cystocele 06/13/2013  . Ex-cigarette smoker 05/04/2013  . Asthmatic bronchitis 05/04/2013  . 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 ESOPHAGEAL REFLUX 03/24/2010  . 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 HYPERCHOLESTEROLEMIA, BORDERLINE, WITH HIGH HDL 01/18/2008  . Disorder of bone and cartilage 01/18/2008  . COLONIC POLYPS 01/17/2008  . Hypothyroidism 01/17/2008  . Anxiety state 01/17/2008  . MITRAL VALVE PROLAPSE, HX OF 01/17/2008    Past Medical History  Diagnosis Date  . MVP (mitral valve prolapse)   . Varicose vein   . Hypercholesterolemia   . Hypothyroidism   . Hx of colonic polyps   . Osteopenia   . COPD (chronic obstructive pulmonary disease)   . Melanoma     lt upper thigh  . 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 Melanoma of thigh 2014    Left   . Prolapsed uterus     Past Surgical History  Procedure Laterality Date  . Breast surgery      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 Patient does not remember exact date of surgery.  . Tonsillectomy    . Colonoscopy    . Cesarean section  1965  . Melanoma excision  12/31/2010    Procedure: MELANOMA EXCISION;  Surgeon: Zenovia Jarred, MD;  Location: [facility];  Service: General;  Laterality: Left;  wide excision melanoma left thigh, excision lesion left thigh  . Other surgical history Right      Surgical repair of architectual distortion Right Breast- Hyperplasia    Current Outpatient Prescriptions  Medication Sig Dispense Refill  . aspirin 81 MG tablet Take 81 mg by mouth daily.      . Calcium Carbonate-Vitamin D (CALCIUM + D PO) Take by mouth daily.      . Cholecalciferol (VITAMIN D) 2000 UNITS CAPS Take 1 capsule by mouth daily.    . cimetidine (TAGAMET) 400 MG tablet Take 400 mg by mouth as needed.    . fluticasone (FLONASE) 50 MCG/ACT nasal spray PLACE 2 SPRAYS INTO BOTH NOSTRILS AS NEEDED. 16 g 1  . levothyroxine (SYNTHROID, LEVOTHROID) 100 MCG tablet TAKE 1 TABLET (100 MCG TOTAL) BY MOUTH DAILY. 90 tablet 1  . LUTEIN PO Take 1 tablet by mouth daily.    . Multiple Vitamin (MULTIVITAMIN) tablet Take 1 tablet by mouth daily.      . nicotine polacrilex (NICORETTE) 2 MG gum Take 2 mg by mouth as needed. Not smoking     No current facility-administered medications for this visit.     ALLERGIES: Codeine  Family History  Problem Relation Age of Onset  . Diabetes Paternal Grandmother   . Diabetes Paternal Grandfather   . Hypertension Mother   . Congestive Heart Failure Mother   . Thyroid disease Mother   .  Hypertension Maternal Grandfather   . CVA Maternal Grandfather   . Stroke Father   . Heart attack Father 28  . Esophageal cancer Neg Hx   . Rectal cancer Neg Hx   . Stomach cancer Neg Hx   . Colon cancer Neg Hx     Social History   Social History  . Marital Status: Widowed    Spouse Name: fred Knight  . Number of Children: N/A  . Years of Education: N/A   Occupational History  . Not on file.   Social History Main Topics  . Smoking status: Former Smoker -- 1.00 packs/day for 50 years    Types: Cigarettes    Start date: 03/08/1960    Quit date: 07/23/2010  . Smokeless tobacco: Never Used     Comment: quit smoking 3 years ago  . Alcohol Use: No  . Drug Use: No  . Sexual Activity: No   Other Topics Concern  . Not on file   Social History  Narrative    ROS:  Pertinent items are noted in HPI.  PHYSICAL EXAMINATION:    BP 146/80 mmHg  Pulse 64  Resp 16  Ht 5\' 3"  (1.6 m)  Wt 118 lb (53.524 kg)  BMI 20.91 kg/m2  LMP 02/16/1988 (Approximate)    General appearance: alert, cooperative and appears stated age  Pessary placed and patient able to do maneuvers and states it it comfortable. Declines to attempt removal.   Chaperone was present for exam.  Premier pessary - #3 ring with support - lot number [id number].   ASSESSMENT  Complete uterovaginal prolapse. Hx of precancerous lesion of breast.  Declines estrogens.   PLAN  Pessary fitted. Patient will void prior to leaving office.  Instructions and precautions given.  Will use water based lubricants.  Follow up in 1 week.  Will also schedule annual examination.    An After Visit Summary was printed and given to the patient.  __15____ minutes face to face time of which over 50% was spent in counseling.

## 2014-10-25 ENCOUNTER — Ambulatory Visit (INDEPENDENT_AMBULATORY_CARE_PROVIDER_SITE_OTHER): Payer: Medicare Other | Admitting: Obstetrics and Gynecology

## 2014-10-25 ENCOUNTER — Encounter: Payer: Self-pay | Admitting: Obstetrics and Gynecology

## 2014-10-25 VITALS — BP 138/78 | HR 68 | Ht 63.0 in | Wt 118.8 lb

## 2014-10-25 DIAGNOSIS — N813 Complete uterovaginal prolapse: Secondary | ICD-10-CM

## 2014-10-25 NOTE — Progress Notes (Signed)
Patient ID: Ariel Holmes, female   DOB: [birth year], 74 y.o.   MRN: [id number] GYNECOLOGY  VISIT   HPI: 74 y.o.   Widowed  Caucasian  female   G1P1001 with Patient's last menstrual period was 02/16/1988 (approximate).   here for 1 week pessary check.   Voiding better.  No urinary incontinence. No change in bowel movements.  No vaginal bleeding or discomfort.  Does not even notice the pessary in place unless checks to feel for it.   Walking 4 miles at times.   GYNECOLOGIC HISTORY: Patient's last menstrual period was 02/16/1988 (approximate). Contraception:Postmenopausal Menopausal hormone therapy: None Last mammogram: 12/2013 Density Cat.B/Benign scattered calcifications in both breasts/BiRads 2:[facility] Last pap smear: 2012 normal        OB History    Gravida Para Term Preterm AB TAB SAB Ectopic Multiple Living   1 1 1       1          Patient Active Problem List   Diagnosis Date Noted  . 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 Cystocele 06/13/2013  . Ex-cigarette smoker 05/04/2013  . Asthmatic bronchitis 05/04/2013  . 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 ESOPHAGEAL REFLUX 03/24/2010  . 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 HYPERCHOLESTEROLEMIA, BORDERLINE, WITH HIGH HDL 01/18/2008  . Disorder of bone and cartilage 01/18/2008  . COLONIC POLYPS 01/17/2008  . Hypothyroidism 01/17/2008  . Anxiety state 01/17/2008  . MITRAL VALVE PROLAPSE, HX OF 01/17/2008    Past Medical History  Diagnosis Date  . MVP (mitral valve prolapse)   . Varicose vein   . Hypercholesterolemia   . Hypothyroidism   . Hx of colonic polyps   . Osteopenia   . COPD (chronic obstructive pulmonary disease)   . Melanoma     lt upper thigh  . 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 Melanoma of thigh 2014    Left   . Prolapsed uterus     Past Surgical History  Procedure Laterality Date  . Breast surgery      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 Patient does not remember exact date of surgery.  . Tonsillectomy    .  Colonoscopy    . Cesarean section  1965  . Melanoma excision  12/31/2010    Procedure: MELANOMA EXCISION;  Surgeon: Zenovia Jarred, MD;  Location: [facility];  Service: General;  Laterality: Left;  wide excision melanoma left thigh, excision lesion left thigh  . Other surgical history Right     Surgical repair of architectual distortion Right Breast- Hyperplasia    Current Outpatient Prescriptions  Medication Sig Dispense Refill  . aspirin 81 MG tablet Take 81 mg by mouth daily.      . Calcium Carbonate-Vitamin D (CALCIUM + D PO) Take by mouth daily.      . Cholecalciferol (VITAMIN D) 2000 UNITS CAPS Take 1 capsule by mouth daily.    . cimetidine (TAGAMET) 400 MG tablet Take 400 mg by mouth as needed.    . fluticasone (FLONASE) 50 MCG/ACT nasal spray PLACE 2 SPRAYS INTO BOTH NOSTRILS AS NEEDED. 16 g 1  . levothyroxine (SYNTHROID, LEVOTHROID) 100 MCG tablet TAKE 1 TABLET (100 MCG TOTAL) BY MOUTH DAILY. 90 tablet 1  . LUTEIN PO Take 1 tablet by mouth daily.    . Multiple Vitamin (MULTIVITAMIN) tablet Take 1 tablet by mouth daily.      . nicotine polacrilex (NICORETTE) 2 MG gum Take 2 mg by mouth as needed. Not smoking     No  current facility-administered medications for this visit.     ALLERGIES: Codeine  Family History  Problem Relation Age of Onset  . Diabetes Paternal Grandmother   . Diabetes Paternal Grandfather   . Hypertension Mother   . Congestive Heart Failure Mother   . Thyroid disease Mother   . Hypertension Maternal Grandfather   . CVA Maternal Grandfather   . Stroke Father   . Heart attack Father 75  . Esophageal cancer Neg Hx   . Rectal cancer Neg Hx   . Stomach cancer Neg Hx   . Colon cancer Neg Hx     Social History   Social History  . Marital Status: Widowed    Spouse Name: fred Trimmer  . Number of Children: N/A  . Years of Education: N/A   Occupational History  . Not on file.   Social History Main Topics  . Smoking status: Former  Smoker -- 1.00 packs/day for 50 years    Types: Cigarettes    Start date: 03/08/1960    Quit date: 07/23/2010  . Smokeless tobacco: Never Used     Comment: quit smoking 3 years ago  . Alcohol Use: No  . Drug Use: No  . Sexual Activity: No   Other Topics Concern  . Not on file   Social History Narrative    ROS:  Pertinent items are noted in HPI.  PHYSICAL EXAMINATION:    BP 138/78 mmHg  Pulse 68  Ht 5\' 3"  (1.6 m)  Wt 118 lb 12.8 oz (53.887 kg)  BMI 21.05 kg/m2  LMP 02/16/1988 (Approximate)    General appearance: alert, cooperative and appears stated age   Pelvic: External genitalia:  no lesions              Urethra:  normal appearing urethra with no masses, tenderness or lesions              Bartholins and Skenes: normal                 Vagina: normal appearing vagina with normal color and discharge, no lesions.  Minimal abrasion of left hymen after pessary removed.               Cervix: no lesions         Bimanual Exam:  Uterus:  normal size, contour, position, consistency, mobility, non-tender              Adnexa: normal adnexa and no mass, fullness, tenderness         Pessary #3 ring with support removed, cleansed, and replaced.   Chaperone was present for exam.  ASSESSMENT  Complete uterovaginal prolapse.  Doing well with pessary.   PLAN   Continue pessary use.  Discussed benefits of pessary.  Discussed care of pessary.  She prefers not to remove it and do the cleaning, so our plan will be for her to come in at least every 3 months for this.  Follow up for annual exam next week.   An After Visit Summary was printed and given to the patient.  __15____ minutes face to face time of which over 50% was spent in counseling.

## 2014-10-31 ENCOUNTER — Ambulatory Visit (INDEPENDENT_AMBULATORY_CARE_PROVIDER_SITE_OTHER): Payer: Medicare Other | Admitting: Obstetrics and Gynecology

## 2014-10-31 ENCOUNTER — Encounter: Payer: Self-pay | Admitting: Obstetrics and Gynecology

## 2014-10-31 ENCOUNTER — Telehealth: Payer: Self-pay | Admitting: Obstetrics and Gynecology

## 2014-10-31 VITALS — BP 136/82 | HR 64 | Resp 24 | Ht 62.0 in | Wt 118.8 lb

## 2014-10-31 DIAGNOSIS — Z01419 Encounter for gynecological examination (general) (routine) without abnormal findings: Secondary | ICD-10-CM | POA: Diagnosis not present

## 2014-10-31 NOTE — Progress Notes (Signed)
Patient ID: Ariel Holmes, female   DOB: 1940-08-31, 74 y.o.   MRN: [id number] 74 y.o. G36P1001 Widowed Caucasian female here for annual exam.   Patient is also here for a pessary recheck.  Has pushed the pessary back up.  Staying in place. Voiding well with pessary in. Bowel movements Ok. No vaginal bleeding.   Will start physical therapy on September 19 at [facility], [facility], [facility].   Sees Dr. Marin Olp for melanoma follow up.  Sees Dermatologist at [facility].   PCP:  Teressa Lower, MD  Patient's last menstrual period was 02/16/1988 (approximate).          Sexually active: No.  The current method of family planning is post menopausal status.    Exercising: Yes.      Smoker:  no  Health Maintenance: Pap:  2012 normal History of abnormal Pap:  no MMG:  12/2013 Density Cat.B/Benign scattered calcifications in both breasts/BiRads 2:[facility].  Has appointment already for November 2016. Colonoscopy:   07/13/13 - Dr. Olevia Perches - diverticulosis, repeat in 10 years.  BMD: 04-17-13   Result  Osteopenia with Dr. Lenna Gilford. TDaP:  Tetanus 03-11-10 Screening Labs:  Hb today: PCP, Urine today: PCP   reports that she quit smoking about 4 years ago. Her smoking use included Cigarettes. She started smoking about 54 years ago. She has a 50 pack-year smoking history. She has never used smokeless tobacco. She reports that she does not drink alcohol or use illicit drugs.  Past Medical History  Diagnosis Date  . MVP (mitral valve prolapse)   . Varicose vein   . Hypercholesterolemia   . Hypothyroidism   . Hx of colonic polyps   . Osteopenia   . COPD (chronic obstructive pulmonary disease)   . Melanoma     lt upper thigh  . 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 Melanoma of thigh 2014    Left   . Prolapsed uterus     Past Surgical History  Procedure Laterality Date  . Breast surgery      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 Patient does not remember exact date of surgery.  . Tonsillectomy    .  Colonoscopy    . Cesarean section  1965  . Melanoma excision  12/31/2010    Procedure: MELANOMA EXCISION;  Surgeon: Zenovia Jarred, MD;  Location: [place];  Service: General;  Laterality: Left;  wide excision melanoma left thigh, excision lesion left thigh  . Other surgical history Right     Surgical repair of architectual distortion Right Breast- Hyperplasia    Current Outpatient Prescriptions  Medication Sig Dispense Refill  . aspirin 81 MG tablet Take 81 mg by mouth daily.      . Calcium Carbonate-Vitamin D (CALCIUM + D PO) Take by mouth daily.      . Cholecalciferol (VITAMIN D) 2000 UNITS CAPS Take 1 capsule by mouth daily.    . cimetidine (TAGAMET) 400 MG tablet Take 400 mg by mouth as needed.    . fluticasone (FLONASE) 50 MCG/ACT nasal spray PLACE 2 SPRAYS INTO BOTH NOSTRILS AS NEEDED. 16 g 1  . levothyroxine (SYNTHROID, LEVOTHROID) 100 MCG tablet TAKE 1 TABLET (100 MCG TOTAL) BY MOUTH DAILY. 90 tablet 1  . LUTEIN PO Take 1 tablet by mouth daily.    . Multiple Vitamin (MULTIVITAMIN) tablet Take 1 tablet by mouth daily.      . nicotine polacrilex (NICORETTE) 2 MG gum Take 2 mg by mouth as needed. Not smoking  No current facility-administered medications for this visit.    Family History  Problem Relation Age of Onset  . Diabetes Paternal Grandmother   . Diabetes Paternal Grandfather   . Hypertension Mother   . Congestive Heart Failure Mother   . Thyroid disease Mother   . Hypertension Maternal Grandfather   . CVA Maternal Grandfather   . Stroke Father   . Heart attack Father 58  . Esophageal cancer Neg Hx   . Rectal cancer Neg Hx   . Stomach cancer Neg Hx   . Colon cancer Neg Hx     ROS:  Pertinent items are noted in HPI.  Otherwise, a comprehensive ROS was negative.  Exam:   BP 136/82 mmHg  Pulse 64  Resp 24  Ht 5\' 2"  (1.575 m)  Wt 118 lb 12.8 oz (53.887 kg)  BMI 21.72 kg/m2  LMP 02/16/1988 (Approximate)    General appearance: alert,  cooperative and appears stated age Head: Normocephalic, without obvious abnormality, atraumatic Neck: no adenopathy, supple, symmetrical, trachea midline and thyroid normal to inspection and palpation Lungs: clear to auscultation bilaterally Breasts: normal appearance, no masses or tenderness, Inspection negative, No nipple retraction or dimpling, No nipple discharge or bleeding, No axillary or supraclavicular adenopathy Heart: regular rate and rhythm Abdomen: soft, non-tender; bowel sounds normal; no masses,  no organomegaly Extremities: extremities normal, atraumatic, no cyanosis or edema Skin: Skin color, texture, turgor normal. No rashes or lesions Lymph nodes: Cervical, supraclavicular, and axillary nodes normal. No abnormal inguinal nodes palpated Neurologic: Grossly normal  Pelvic: External genitalia:  no lesions              Urethra:  normal appearing urethra with no masses, tenderness or lesions              Bartholins and Skenes: normal                 Vagina: normal appearing vagina with normal color and discharge, no lesions              Cervix: no lesions              Pap taken: No. Bimanual Exam:  Uterus:  normal size, contour, position, consistency, mobility, non-tender              Adnexa: normal adnexa and no mass, fullness, tenderness              Rectovaginal: Yes.  .  Confirms.              Anus:  normal sphincter tone, no lesions  Chaperone was present for exam.  Assessment:   Well woman visit with normal exam. Incomplete uterovaginal prolapse.  Doing well with pessary. No sign of vaginal ulceration or inflammation.  Hx of premalignant lesion of the breast. Osteopenia.   Plan: Yearly mammogram recommended after age 68.  Recommended self breast exam.  Pap and HR HPV as above. Discussed Calcium, Vitamin D, regular exercise program including cardiovascular and weight bearing exercise. Labs performed.  No..    Refills given on medications.  Yes.    Bone density  per PCP. Pelvic floor PT. Pessary recheck in 2 months.  Follow up annually and prn.      After visit summary provided.

## 2014-10-31 NOTE — Telephone Encounter (Signed)
Left voicemail regarding referral appointment. The information is listed below. Should the patient need to cancel or reschedule this appointment, please advise them to call the office they've been referred to in order to reschedule.  [facility] [address]le[address]oup [place] 2nd floor [place] [phone]  11/04/14 @12pm . Arrive at 78 with insurance card/photo id.

## 2014-10-31 NOTE — Patient Instructions (Signed)

## 2014-11-29 ENCOUNTER — Ambulatory Visit (INDEPENDENT_AMBULATORY_CARE_PROVIDER_SITE_OTHER): Payer: Medicare Other

## 2014-11-29 DIAGNOSIS — Z23 Encounter for immunization: Secondary | ICD-10-CM

## 2014-12-02 ENCOUNTER — Telehealth: Payer: Self-pay | Admitting: Obstetrics and Gynecology

## 2014-12-02 NOTE — Telephone Encounter (Signed)
I do recommend an office visit today or tomorrow.  The end of the week is too long to wait for evaluation.

## 2014-12-02 NOTE — Telephone Encounter (Signed)
Spoke with patient. Patient states that she has a pessary in place. Began to have burning with urination and urgency yesterday. Is have discomfort in her back on her left lower side. Denies any fevers or chills. Advised she will need to be seen in office for further evaluation. Offered appointment for today with Dr.Silva but patient declines due to work schedule. Offered appointment on Wednesday at 12:45 pm and 1 pm with Dr.Silva but patient declines. Offered to schedule with another provider on Tuesday or Wednesday but patient declines as she only wants to see Dr.Silva. Appointment scheduled for Thursday 10/20 at 10:45 am with Dr.Silva. Patient is requesting to be placed on the waitlist for Wednesday after 1 pm. Placed on waitlist in EPIC. Patient is agreeable to date and time. Advised she will need to monitor symptoms if symptoms worsen or she develops a fever or chills she will need to be seen at a local ER or urgent care. If symptoms worsen or new symptoms develop throughout the day she will need to be seen in office. Patient is agreeable and verbalizes understanding.   Routing to provider for final review. Patient agreeable to disposition. Will close encounter.

## 2014-12-02 NOTE — Telephone Encounter (Signed)
Spoke with patient. Advised of message as seen below from [place]. Offered appointment this afternoon but patient declines due to work. Offered appointment tomorrow with another provider but patient declines. "I only want to see Dr.Silva. She really knows what is going on with me." Appointment moved up to Wednesday 10/19 at 1 pm with Dr.Silva. Patient is agreeable to date and time. Aware it is our recommendation that she been seen earlier. Patient again declines. Aware she will need to call to be seen earlier if symptoms change or worsen. Will need to be seen with local ER or urgent care if symptoms worsen or change when office is closed.  Routing to provider for final review. Patient agreeable to disposition. Will close encounter.

## 2014-12-02 NOTE — Telephone Encounter (Signed)
Patient thinks she may have a bacterial infection. She is having some burning, urgency and some pain on the left side with pessary.

## 2014-12-04 ENCOUNTER — Ambulatory Visit (INDEPENDENT_AMBULATORY_CARE_PROVIDER_SITE_OTHER): Payer: Medicare Other | Admitting: Obstetrics and Gynecology

## 2014-12-04 ENCOUNTER — Encounter: Payer: Self-pay | Admitting: Obstetrics and Gynecology

## 2014-12-04 VITALS — BP 140/70 | HR 72 | Resp 16 | Ht 62.0 in | Wt 115.0 lb

## 2014-12-04 DIAGNOSIS — R399 Unspecified symptoms and signs involving the genitourinary system: Secondary | ICD-10-CM

## 2014-12-04 DIAGNOSIS — N813 Complete uterovaginal prolapse: Secondary | ICD-10-CM

## 2014-12-04 LAB — POCT URINALYSIS DIPSTICK
BILIRUBIN UA: NEGATIVE
Glucose, UA: NEGATIVE
KETONES UA: NEGATIVE
Nitrite, UA: NEGATIVE
Protein, UA: NEGATIVE
Urobilinogen, UA: NEGATIVE
pH, UA: 5

## 2014-12-04 MED ORDER — PHENAZOPYRIDINE HCL 100 MG PO TABS: 100.0000 mg | ORAL_TABLET | Freq: Three times a day (TID) | ORAL | Status: DC | PRN

## 2014-12-04 MED ORDER — NITROFURANTOIN MONOHYD MACRO 100 MG PO CAPS: 100.0000 mg | ORAL_CAPSULE | Freq: Two times a day (BID) | ORAL | Status: DC

## 2014-12-04 NOTE — Progress Notes (Signed)
GYNECOLOGY  VISIT   HPI: 74 y.o.   Widowed  Caucasian  female   G1P1001 with Patient's last menstrual period was 02/16/1988 (approximate).   here for UTI symptoms.  Having some bilateral lower back pain.  Some incontinence.  Dysuria.  No fevers, shakes, or chills.  No nausea or vomiting.   Using a pessary.   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   UA: WBC=Moderate, RBC=Trace.  GYNECOLOGIC HISTORY: Patient's last menstrual period was 02/16/1988 (approximate). Contraception: Post menopausal  Menopausal hormone therapy: None Last mammogram: 12/17/13 BIRADS2:Benign  Last pap smear: 2012 Normal         OB History    Gravida Para Term Preterm AB TAB SAB Ectopic Multiple Living   1 1 1       1          Patient Active Problem List   Diagnosis Date Noted  . 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 Cystocele 06/13/2013  . Ex-cigarette smoker 05/04/2013  . Asthmatic bronchitis 05/04/2013  . Melanoma of thigh ([place]) 02/15/2011  . ESOPHAGEAL REFLUX 03/24/2010  . 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 HYPERCHOLESTEROLEMIA, BORDERLINE, WITH HIGH HDL 01/18/2008  . Disorder of bone and cartilage 01/18/2008  . COLONIC POLYPS 01/17/2008  . Hypothyroidism 01/17/2008  . Anxiety state 01/17/2008  . MITRAL VALVE PROLAPSE, HX OF 01/17/2008    Past Medical History  Diagnosis Date  . MVP (mitral valve prolapse)   . Varicose vein   . Hypercholesterolemia   . Hypothyroidism   . Hx of colonic polyps   . Osteopenia   . COPD (chronic obstructive pulmonary disease)   . Melanoma     lt upper thigh  . 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 Melanoma of thigh 2014    Left   . Prolapsed uterus     Past Surgical History  Procedure Laterality Date  . Breast surgery      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 Patient does not remember exact date of surgery.  . Tonsillectomy    . Colonoscopy    . Cesarean section  1965  . Melanoma excision  12/31/2010    Procedure: MELANOMA EXCISION;  Surgeon: Zenovia Jarred, MD;  Location:  [place];  Service: General;  Laterality: Left;  wide excision melanoma left thigh, excision lesion left thigh  . Other surgical history Right     Surgical repair of architectual distortion Right Breast- Hyperplasia    Current Outpatient Prescriptions  Medication Sig Dispense Refill  . aspirin 81 MG tablet Take 81 mg by mouth daily.      . Calcium Carbonate-Vitamin D (CALCIUM + D PO) Take by mouth daily.      . Cholecalciferol (VITAMIN D) 2000 UNITS CAPS Take 1 capsule by mouth daily.    . cimetidine (TAGAMET) 400 MG tablet Take 400 mg by mouth as needed.    . fluticasone (FLONASE) 50 MCG/ACT nasal spray PLACE 2 SPRAYS INTO BOTH NOSTRILS AS NEEDED. 16 g 1  . levothyroxine (SYNTHROID, LEVOTHROID) 100 MCG tablet TAKE 1 TABLET (100 MCG TOTAL) BY MOUTH DAILY. 90 tablet 1  . LUTEIN PO Take 1 tablet by mouth daily.    . Multiple Vitamin (MULTIVITAMIN) tablet Take 1 tablet by mouth daily.      . nicotine polacrilex (NICORETTE) 2 MG gum Take 2 mg by mouth as needed. Not smoking     No current facility-administered medications for this visit.     ALLERGIES: Codeine  Family History  Problem Relation Age of Onset  .  Diabetes Paternal Grandmother   . Diabetes Paternal Grandfather   . Hypertension Mother   . Congestive Heart Failure Mother   . Thyroid disease Mother   . Hypertension Maternal Grandfather   . CVA Maternal Grandfather   . Stroke Father   . Heart attack Father 12  . Esophageal cancer Neg Hx   . Rectal cancer Neg Hx   . Stomach cancer Neg Hx   . Colon cancer Neg Hx     Social History   Social History  . Marital Status: Widowed    Spouse Name: fred Pounders  . Number of Children: N/A  . Years of Education: N/A   Occupational History  . Not on file.   Social History Main Topics  . Smoking status: Former Smoker -- 1.00 packs/day for 50 years    Types: Cigarettes    Start date: 03/08/1960    Quit date: 07/23/2010  . Smokeless tobacco: Never Used      Comment: quit smoking 3 years ago  . Alcohol Use: No  . Drug Use: No  . Sexual Activity: No   Other Topics Concern  . Not on file   Social History Narrative    ROS:  Pertinent items are noted in HPI.  PHYSICAL EXAMINATION:    BP 140/70 mmHg  Pulse 72  Resp 16  Ht 5\' 2"  (1.575 m)  Wt 115 lb (52.164 kg)  BMI 21.03 kg/m2  LMP 02/16/1988 (Approximate)    General appearance: alert, cooperative and appears stated age Abdomen:  Mild tenderness to midline lower abdomen.  No guarding or rebound.  Back:  No CVA tenderness.  Pelvic: External genitalia:  no lesions              Urethra:  normal appearing urethra with no masses, tenderness or lesions              Bartholins and Skenes: normal                 Vagina: normal appearing vagina with normal color and discharge, no lesions              Cervix:  No lesions.              Bimanual Exam:  Uterus:  normal size, contour, position, consistency, mobility, non-tender              Adnexa: normal adnexa and no mass, fullness, tenderness            Pessary removed, cleansed and replaced.    Chaperone was present for exam.  ASSESSMENT  UTI.  Uterovaginal prolapse.   PLAN   Macrobid 100 mg po bid for 7 days.  Pyridium 100 mg po tid prn.  Hydrate well.  Urine micro and culture.  Follow up in about 10 days for a recheck.    An After Visit Summary was printed and given to the patient.  ___15___ minutes face to face time of which over 50% was spent in counseling.

## 2014-12-05 ENCOUNTER — Ambulatory Visit: Payer: Medicare Other | Admitting: Obstetrics and Gynecology

## 2014-12-05 LAB — URINALYSIS, MICROSCOPIC ONLY
Casts: NONE SEEN [LPF]
Crystals: NONE SEEN [HPF]
RBC / HPF: NONE SEEN RBC/HPF (ref <=2)
Yeast: NONE SEEN [HPF]

## 2014-12-07 LAB — URINE CULTURE

## 2014-12-09 ENCOUNTER — Telehealth: Payer: Self-pay

## 2014-12-09 NOTE — Telephone Encounter (Signed)
-----   Message from Salvadore Dom, MD sent at 12/09/2014  2:24 PM EDT ----- Please inform the patient, UTI sensitive to macrobid and see how she is feeling.

## 2014-12-09 NOTE — Telephone Encounter (Signed)
Spoke with patient. Advised of results as seen below from [place]. Patient is agreeable and verbalizes understanding. States that she feels much better. No current urinary symptoms.  Routing to provider for final review. Patient agreeable to disposition. Will close encounter.

## 2014-12-13 ENCOUNTER — Ambulatory Visit (INDEPENDENT_AMBULATORY_CARE_PROVIDER_SITE_OTHER): Payer: Medicare Other | Admitting: Obstetrics and Gynecology

## 2014-12-13 ENCOUNTER — Encounter: Payer: Self-pay | Admitting: Obstetrics and Gynecology

## 2014-12-13 VITALS — Ht 62.0 in

## 2014-12-13 DIAGNOSIS — N390 Urinary tract infection, site not specified: Secondary | ICD-10-CM

## 2014-12-13 LAB — POCT URINALYSIS DIPSTICK
Bilirubin, UA: NEGATIVE
Blood, UA: NEGATIVE
GLUCOSE UA: NEGATIVE
KETONES UA: NEGATIVE
Nitrite, UA: NEGATIVE
PROTEIN UA: NEGATIVE
Urobilinogen, UA: NEGATIVE
pH, UA: 5

## 2014-12-13 NOTE — Progress Notes (Signed)
Patient ID: SHAKINA CHOY, female   DOB: 1940-12-13, 74 y.o.   MRN: [id number] GYNECOLOGY  VISIT   HPI: 74 y.o.   Widowed  Caucasian  female   G1P1001 with Patient's last menstrual period was 02/16/1988 (approximate).   here for follow up visit for UTI and pessary check.  Had E Coli UTI.  Treated with Macrobid.  Feeling much better.  No further pain.  Did not take Pyridium.   Doing well with pessary.  Really happy with this.   Going to [facility] for a visit.  Leaves in 3 days.   Urine - 1+ WBCs.  GYNECOLOGIC HISTORY: Patient's last menstrual period was 02/16/1988 (approximate). Contraception:Postmenopausal Menopausal hormone therapy: None Last mammogram: 12-17-13 Density Cat.B/Benign scattered calcifications both breasts/Neg/BiRads2:[facility] Last pap smear: 2012 normal        OB History    Gravida Para Term Preterm AB TAB SAB Ectopic Multiple Living   1 1 1       1          Patient Active Problem List   Diagnosis Date Noted  . 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 Cystocele 06/13/2013  . Ex-cigarette smoker 05/04/2013  . Asthmatic bronchitis 05/04/2013  . Melanoma of thigh ([place]) 02/15/2011  . ESOPHAGEAL REFLUX 03/24/2010  . 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 HYPERCHOLESTEROLEMIA, BORDERLINE, WITH HIGH HDL 01/18/2008  . Disorder of bone and cartilage 01/18/2008  . COLONIC POLYPS 01/17/2008  . Hypothyroidism 01/17/2008  . Anxiety state 01/17/2008  . MITRAL VALVE PROLAPSE, HX OF 01/17/2008    Past Medical History  Diagnosis Date  . MVP (mitral valve prolapse)   . Varicose vein   . Hypercholesterolemia   . Hypothyroidism   . Hx of colonic polyps   . Osteopenia   . COPD (chronic obstructive pulmonary disease) ([place])   . Melanoma ([place])     lt upper thigh  . Melanoma of thigh ([place]) 02/15/2011  . Melanoma of thigh ([place]) 2014    Left   . Prolapsed uterus     Past Surgical History  Procedure Laterality Date  . Breast surgery      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  Patient does not remember exact date of surgery.  . Tonsillectomy    . Colonoscopy    . Cesarean section  1965  . Melanoma excision  12/31/2010    Procedure: MELANOMA EXCISION;  Surgeon: Zenovia Jarred, MD;  Location: [place];  Service: General;  Laterality: Left;  wide excision melanoma left thigh, excision lesion left thigh  . Other surgical history Right     Surgical repair of architectual distortion Right Breast- Hyperplasia    Current Outpatient Prescriptions  Medication Sig Dispense Refill  . acyclovir ointment (ZOVIRAX) 5 % See admin instructions.  0  . aspirin 81 MG tablet Take 81 mg by mouth daily.      . Calcium Carbonate-Vitamin D (CALCIUM + D PO) Take by mouth daily.      . Cholecalciferol (VITAMIN D) 2000 UNITS CAPS Take 1 capsule by mouth daily.    . cimetidine (TAGAMET) 400 MG tablet Take 400 mg by mouth as needed.    . fluticasone (FLONASE) 50 MCG/ACT nasal spray PLACE 2 SPRAYS INTO BOTH NOSTRILS AS NEEDED. 16 g 1  . levothyroxine (SYNTHROID, LEVOTHROID) 100 MCG tablet TAKE 1 TABLET (100 MCG TOTAL) BY MOUTH DAILY. 90 tablet 1  . LUTEIN PO Take 1 tablet by mouth daily.    . Multiple Vitamin (MULTIVITAMIN) tablet Take 1  tablet by mouth daily.      . nicotine polacrilex (NICORETTE) 2 MG gum Take 2 mg by mouth as needed. Not smoking    . phenazopyridine (PYRIDIUM) 100 MG tablet Take 1 tablet (100 mg total) by mouth 3 (three) times daily as needed for pain. 10 tablet 0   No current facility-administered medications for this visit.     ALLERGIES: Codeine  Family History  Problem Relation Age of Onset  . Diabetes Paternal Grandmother   . Diabetes Paternal Grandfather   . Hypertension Mother   . Congestive Heart Failure Mother   . Thyroid disease Mother   . Hypertension Maternal Grandfather   . CVA Maternal Grandfather   . Stroke Father   . Heart attack Father 7  . Esophageal cancer Neg Hx   . Rectal cancer Neg Hx   . Stomach cancer Neg Hx   .  Colon cancer Neg Hx     Social History   Social History  . Marital Status: Widowed    Spouse Name: fred Munsch  . Number of Children: N/A  . Years of Education: N/A   Occupational History  . Not on file.   Social History Main Topics  . Smoking status: Former Smoker -- 1.00 packs/day for 50 years    Types: Cigarettes    Start date: 03/08/1960    Quit date: 07/23/2010  . Smokeless tobacco: Never Used     Comment: quit smoking 3 years ago  . Alcohol Use: No  . Drug Use: No  . Sexual Activity: No   Other Topics Concern  . Not on file   Social History Narrative    ROS:  Pertinent items are noted in HPI.  PHYSICAL EXAMINATION:    Ht 5\' 2"  (1.575 m)  LMP 02/16/1988 (Approximate)    General appearance: alert, cooperative and appears stated age  Pelvic: External genitalia:  no lesions              Urethra:  normal appearing urethra with no masses, tenderness or lesions              Bartholins and Skenes: normal                 Vagina: normal appearing vagina with normal color and discharge, no lesions              Cervix: no lesions            Bimanual Exam:  Uterus:  normal size, contour, position, consistency, mobility, non-tender              Adnexa: normal adnexa and no mass, fullness, tenderness               Pessary removed, cleansed, and replaced.  Chaperone was present for exam.  ASSESSMENT  Status post recent E Coli UTI treated. Complete uterovaginal prolapse.  Doing well with pessary.   PLAN  UC sent for test of cure.  Continue pessary use.  Follow up in 10 weeks for a pessary check; this will be after the first of the year.     An After Visit Summary was printed and given to the patient.  __15____ minutes face to face time of which over 50% was spent in counseling.

## 2014-12-14 LAB — URINE CULTURE
Colony Count: NO GROWTH
ORGANISM ID, BACTERIA: NO GROWTH

## 2014-12-15 ENCOUNTER — Telehealth: Payer: Self-pay | Admitting: Gynecology

## 2014-12-15 MED ORDER — CIPROFLOXACIN HCL 250 MG PO TABS: 250.0000 mg | ORAL_TABLET | Freq: Two times a day (BID) | ORAL | Status: DC

## 2014-12-15 NOTE — Telephone Encounter (Signed)
On Call Note:  Recently treated for E coli UTI with Macrobid. Symptoms resolved with recent test of cure with negative culture 12/13/2014.  Over last 24 hours increasing frequency and dysuria with urgency.  No low back pain, fevers or chills.  Leaving out of town tomorrow.  Sensitivities from prior E coli sensitive to all studied.  Will Rx with ciprofloxacin 250 mg Bid X 5 days with ASAP follow up instructions given.

## 2015-01-03 ENCOUNTER — Telehealth: Payer: Self-pay | Admitting: Pulmonary Disease

## 2015-01-03 NOTE — Telephone Encounter (Signed)
Spoke with Janett Billow at [place], she said that she faxed an order to Dr. Lenna Gilford advising him that patient had abnormal mammogram and requesting to do diagnostic mammogram.  Patient has appointment this morning for diagnostic mammogram and she needs the order faxed to her ASAP.  Dr. Lenna Gilford, please advise.

## 2015-01-03 NOTE — Telephone Encounter (Signed)
Order faxed to [facility] this morning at 8:40am Received confirmation fax  Order placed in SN scan folder Nothing further is needed

## 2015-01-27 ENCOUNTER — Other Ambulatory Visit (HOSPITAL_BASED_OUTPATIENT_CLINIC_OR_DEPARTMENT_OTHER): Payer: Medicare Other

## 2015-01-27 ENCOUNTER — Ambulatory Visit (HOSPITAL_BASED_OUTPATIENT_CLINIC_OR_DEPARTMENT_OTHER): Payer: Medicare Other | Admitting: Hematology & Oncology

## 2015-01-27 ENCOUNTER — Encounter: Payer: Self-pay | Admitting: Hematology & Oncology

## 2015-01-27 VITALS — BP 163/66 | HR 64 | Temp 97.8°F | Resp 18 | Ht 62.0 in | Wt 116.0 lb

## 2015-01-27 DIAGNOSIS — M810 Age-related osteoporosis without current pathological fracture: Secondary | ICD-10-CM

## 2015-01-27 DIAGNOSIS — Z85820 Personal history of malignant melanoma of skin: Secondary | ICD-10-CM

## 2015-01-27 DIAGNOSIS — C4372 Malignant melanoma of left lower limb, including hip: Secondary | ICD-10-CM

## 2015-01-27 LAB — COMPREHENSIVE METABOLIC PANEL
ALT: 13 U/L (ref 0–55)
ANION GAP: 8 meq/L (ref 3–11)
AST: 20 U/L (ref 5–34)
Albumin: 3.8 g/dL (ref 3.5–5.0)
Alkaline Phosphatase: 61 U/L (ref 40–150)
BUN: 14.6 mg/dL (ref 7.0–26.0)
CHLORIDE: 108 meq/L (ref 98–109)
CO2: 25 meq/L (ref 22–29)
Calcium: 9.3 mg/dL (ref 8.4–10.4)
Creatinine: 0.7 mg/dL (ref 0.6–1.1)
EGFR: 82 mL/min/{1.73_m2} — ABNORMAL LOW (ref >90)
Glucose: 63 mg/dl — ABNORMAL LOW (ref 70–140)
Potassium: 3.8 mEq/L (ref 3.5–5.1)
Sodium: 141 mEq/L (ref 136–145)
Total Bilirubin: <0.3 mg/dL (ref 0.20–1.20)
Total Protein: 6.8 g/dL (ref 6.4–8.3)

## 2015-01-27 LAB — CBC WITH DIFFERENTIAL (CANCER CENTER ONLY)
BASO#: 0 10*3/uL (ref 0.0–0.2)
BASO%: 0.2 % (ref 0.0–2.0)
EOS ABS: 0.1 10*3/uL (ref 0.0–0.5)
EOS%: 0.8 % (ref 0.0–7.0)
HCT: 37.4 % (ref 34.8–46.6)
HGB: 12.1 g/dL (ref 11.6–15.9)
LYMPH#: 4.9 10*3/uL — ABNORMAL HIGH (ref 0.9–3.3)
LYMPH%: 55.2 % — AB (ref 14.0–48.0)
MCH: 30.6 pg (ref 26.0–34.0)
MCHC: 32.4 g/dL (ref 32.0–36.0)
MCV: 94 fL (ref 81–101)
MONO#: 0.9 10*3/uL (ref 0.1–0.9)
MONO%: 10.2 % (ref 0.0–13.0)
NEUT#: 3 10*3/uL (ref 1.5–6.5)
NEUT%: 33.6 % — AB (ref 39.6–80.0)
PLATELETS: 227 10*3/uL (ref 145–400)
RBC: 3.96 10*6/uL (ref 3.70–5.32)
RDW: 14.9 % (ref 11.1–15.7)
WBC: 8.9 10*3/uL (ref 3.9–10.0)

## 2015-01-27 NOTE — Progress Notes (Signed)
Hematology and Oncology Follow Up Visit  Ariel Holmes [id number] [birth year] 74 y.o. 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  Current Therapy:    Observation     Interim History:  Ms.  Holmes is in for followup. Is been a tough year for her. Her husband passed away in Feb 28, 2022. He had congestive heart failure. For herself, she seems to be doing pretty well. She's had no problems with nausea or vomiting. She's had no suspicious skin lesions. She's had no fever. There's not any change in bowel or bladder habits.  She did have a mammogram that was suspicious for a lesion in the right breast. On a repeat mammogram, everything looked okay.  She's had no headache.  Her appetite has been pretty good.  She's not noted any suspicious skin lesions.  Overall, her performance status is ECOG 1.  Medications:  Current outpatient prescriptions:  .  acyclovir ointment (ZOVIRAX) 5 %, See admin instructions., Disp: , Rfl: 0 .  aspirin 81 MG tablet, Take 81 mg by mouth daily.  , Disp: , Rfl:  .  Calcium Carbonate-Vitamin D (CALCIUM + D PO), Take by mouth daily.  , Disp: , Rfl:  .  Cholecalciferol (VITAMIN D) 2000 UNITS CAPS, Take 1 capsule by mouth daily., Disp: , Rfl:  .  cimetidine (TAGAMET) 400 MG tablet, Take 400 mg by mouth as needed., Disp: , Rfl:  .  ciprofloxacin (CIPRO) 250 MG tablet, Take 1 tablet (250 mg total) by mouth 2 (two) times daily. For 7 days, Disp: 14 tablet, Rfl: 0 .  fluticasone (FLONASE) 50 MCG/ACT nasal spray, PLACE 2 SPRAYS INTO BOTH NOSTRILS AS NEEDED., Disp: 16 g, Rfl: 1 .  levothyroxine (SYNTHROID, LEVOTHROID) 100 MCG tablet, TAKE 1 TABLET (100 MCG TOTAL) BY MOUTH DAILY., Disp: 90 tablet, Rfl: 1 .  LUTEIN PO, Take 1 tablet by mouth daily., Disp: , Rfl:  .  Multiple Vitamin (MULTIVITAMIN) tablet, Take 1 tablet by mouth daily.  , Disp: , Rfl:  .  nicotine polacrilex (NICORETTE) 2 MG gum, Take 2 mg by mouth as  needed. Not smoking, Disp: , Rfl:  .  phenazopyridine (PYRIDIUM) 100 MG tablet, Take 1 tablet (100 mg total) by mouth 3 (three) times daily as needed for pain., Disp: 10 tablet, Rfl: 0  Allergies:  Allergies  Allergen Reactions  . Codeine Nausea And Vomiting    Past Medical History, Surgical history, Social history, and Family History were reviewed and updated.  Review of Systems: As above  Physical Exam:  height is 5\' 2"  (1.575 m) and weight is 116 lb (52.617 kg). Her oral temperature is 97.8 F (36.6 C). Her blood pressure is 163/66 and her pulse is 64. Her respiration is 18.   We'll do and well-nourished white female. There are no ocular or oral lesions. She is no palpable cervical or supraclavicular lymph nodes. Lungs are clear. Cardiac exam regular  rate and rhythm with no murmurs rubs or bruits. Abdomen is soft. She has good bowel sounds. There is no fluid wave. There is no palpable liver or spleen tip. Extremities shows a well-healed wide local excision scar on the inner upper left thigh. No swelling is noted in the left leg. There is no left inguinal adenopathy. Right leg is unremarkable. Skin exam shows no rashes ecchymosis or petechia.  Lab Results  Component Value Date   WBC 8.9 01/27/2015   HGB 12.1 01/27/2015   HCT  37.4 01/27/2015   MCV 94 01/27/2015   PLT 227 01/27/2015     Chemistry      Component Value Date/Time   NA 145 01/28/2014 1132   NA 138 06/19/2012 0946   K 4.1 01/28/2014 1132   K 4.2 06/19/2012 0946   CL 104 01/28/2014 1132   CL 102 06/19/2012 0946   CO2 29 01/28/2014 1132   CO2 26 06/19/2012 0946   BUN 14 01/28/2014 1132   BUN 12 06/19/2012 0946   CREATININE 0.7 01/28/2014 1132   CREATININE 0.62 06/19/2012 0946      Component Value Date/Time   CALCIUM 9.7 01/28/2014 1132   CALCIUM 9.8 06/19/2012 0946   ALKPHOS 59 01/28/2014 1132   ALKPHOS 68 06/19/2012 0946   AST 26 01/28/2014 1132   AST 20 06/19/2012 0946   ALT 22 01/28/2014 1132   ALT 16  06/19/2012 0946   BILITOT 0.60 01/28/2014 1132   BILITOT 0.4 06/19/2012 0946         Impression and Plan: Ms. Holmes is 74 year old white female with a history of a lentigo maligna melanoma left thigh. She underwent surgery for this. This was back in November of 2012.  Again, she is doing well.  I don't see any skin lesions that look suspicious. She does see dermatology quite often..  We see her back in 1 year, that should be the five-year mark so we can undergo from the practice afterwards.  Ariel Napoleon, MD 12/12/201611:36 AM

## 2015-01-28 LAB — VITAMIN D 25 HYDROXY (VIT D DEFICIENCY, FRACTURES): Vit D, 25-Hydroxy: 61 ng/mL (ref 30–100)

## 2015-02-03 ENCOUNTER — Encounter: Payer: Self-pay | Admitting: Pulmonary Disease

## 2015-02-10 ENCOUNTER — Other Ambulatory Visit: Payer: Self-pay | Admitting: Pulmonary Disease

## 2015-02-24 ENCOUNTER — Telehealth: Payer: Self-pay | Admitting: Obstetrics and Gynecology

## 2015-02-24 NOTE — Telephone Encounter (Signed)
Left message to call [facility] at [phone].  Please offer patient appointment today with Dr.Silva at 12 pm time per Lamont Snowball, RN

## 2015-02-24 NOTE — Telephone Encounter (Signed)
Patient left message on our voicemail this morning at 8:40 she states that she is having vaginal bleeding, patient does have a pessary in and is schedule for check 02/26/15 patient wants to know if she should come in sooner? Best # to reach: [phone]

## 2015-02-24 NOTE — Telephone Encounter (Signed)
I agree with your recommendations, and I will see the patient on 02/26/15.

## 2015-02-24 NOTE — Telephone Encounter (Signed)
Spoke with patient. Patient states that she has a pessary in place. Yesterday she had bright red spotting one time. Has not had any bleeding since. "I can feel that the pessary is in place." Denies any pain or discomfort. Is able to empty her bladder. Denies any fevers, chills, lower back pain, or burning with urination. Patient is scheduled for a pessary check on 02/26/2015 with Dr.Silva. Patient states "I do not know how to remove and replace it. Your office always does that for me." Advised it is okay for her to wait until her appointment on 02/26/2015 with Dr.Silva. Advised if bleeding reoccurs or develops any discomfort or trouble emptying her bladder she will need to be seen earlier in our office or at local ER. Patient is agreeable.  Dr.Silva anything further for this patient?

## 2015-02-26 ENCOUNTER — Ambulatory Visit (INDEPENDENT_AMBULATORY_CARE_PROVIDER_SITE_OTHER): Payer: Medicare Other | Admitting: Obstetrics and Gynecology

## 2015-02-26 ENCOUNTER — Encounter: Payer: Self-pay | Admitting: Obstetrics and Gynecology

## 2015-02-26 VITALS — BP 140/82 | HR 64 | Ht 62.0 in | Wt 119.2 lb

## 2015-02-26 DIAGNOSIS — R8290 Unspecified abnormal findings in urine: Secondary | ICD-10-CM | POA: Diagnosis not present

## 2015-02-26 DIAGNOSIS — T8369XA Infection and inflammatory reaction due to other prosthetic device, implant and graft in genital tract, initial encounter: Secondary | ICD-10-CM

## 2015-02-26 DIAGNOSIS — N760 Acute vaginitis: Secondary | ICD-10-CM | POA: Diagnosis not present

## 2015-02-26 DIAGNOSIS — N813 Complete uterovaginal prolapse: Secondary | ICD-10-CM

## 2015-02-26 MED ORDER — METRONIDAZOLE 0.75 % VA GEL: VAGINAL | Status: DC

## 2015-02-26 NOTE — Progress Notes (Signed)
Patient ID: Ariel Holmes, female   DOB: 09-29-1940, 75 y.o.   MRN: [id number] GYNECOLOGY  VISIT   HPI: 75 y.o.   Widowed  Caucasian  female   G1P1001 with Patient's last menstrual period was 02/16/1988 (approximate).   here for pessary check.  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   Patient states has bright red spotting (gush) of blood on 02-24-15.  She hasn't seen any blood since.  She is also complaining of vaginal/urine odor--she's not sure which. Very minor cramping.  No pain from pessary.  "I love my pessary." No pain with urination. Denies rectal bleeding.  Placed her finger inside to check.   Had a great time in [place].  Planning on a trip to  this spring.   GYNECOLOGIC HISTORY: Patient's last menstrual period was 02/16/1988 (approximate). Contraception:Postmenopausal Menopausal hormone therapy: None Last mammogram: 01-03-15 3D/density cat.B/additional views of Left breast--Neg/BiRads 2. Screening 1year:[facility] Last pap smear: 2012 normal        OB History    Gravida Para Term Preterm AB TAB SAB Ectopic Multiple Living   1 1 1       1          Patient Active Problem List   Diagnosis Date Noted  . 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 Cystocele 06/13/2013  . Ex-cigarette smoker 05/04/2013  . Asthmatic bronchitis 05/04/2013  . Melanoma of thigh ([place]) 02/15/2011  . ESOPHAGEAL REFLUX 03/24/2010  . 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 HYPERCHOLESTEROLEMIA, BORDERLINE, WITH HIGH HDL 01/18/2008  . Disorder of bone and cartilage 01/18/2008  . COLONIC POLYPS 01/17/2008  . Hypothyroidism 01/17/2008  . Anxiety state 01/17/2008  . MITRAL VALVE PROLAPSE, HX OF 01/17/2008    Past Medical History  Diagnosis Date  . MVP (mitral valve prolapse)   . Varicose vein   . Hypercholesterolemia   . Hypothyroidism   . Hx of colonic polyps   . Osteopenia   . COPD (chronic obstructive pulmonary disease) ([place])   . Melanoma ([place])     lt upper thigh  . Melanoma of thigh ([place])  02/15/2011  . Melanoma of thigh ([place]) 2014    Left   . Prolapsed uterus     Past Surgical History  Procedure Laterality Date  . Breast surgery      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 Patient does not remember exact date of surgery.  . Tonsillectomy    . Colonoscopy    . Cesarean section  1965  . Melanoma excision  12/31/2010    Procedure: MELANOMA EXCISION;  Surgeon: Zenovia Jarred, MD;  Location: [facility];  Service: General;  Laterality: Left;  wide excision melanoma left thigh, excision lesion left thigh  . Other surgical history Right     Surgical repair of architectual distortion Right Breast- Hyperplasia    Current Outpatient Prescriptions  Medication Sig Dispense Refill  . acyclovir ointment (ZOVIRAX) 5 % See admin instructions.  0  . aspirin 81 MG tablet Take 81 mg by mouth daily.      . Calcium Carbonate-Vitamin D (CALCIUM + D PO) Take by mouth daily.      . Cholecalciferol (VITAMIN D) 2000 UNITS CAPS Take 1 capsule by mouth daily.    . cimetidine (TAGAMET) 400 MG tablet Take 400 mg by mouth as needed.    . fluticasone (FLONASE) 50 MCG/ACT nasal spray PLACE 2 SPRAYS INTO BOTH NOSTRILS AS NEEDED. 16 g 1  . levothyroxine (SYNTHROID, LEVOTHROID) 100 MCG tablet TAKE 1 TABLET (100  MCG TOTAL) BY MOUTH DAILY. 90 tablet 1  . LUTEIN PO Take 1 tablet by mouth daily.    . Multiple Vitamin (MULTIVITAMIN) tablet Take 1 tablet by mouth daily.      . nicotine polacrilex (NICORETTE) 2 MG gum Take 2 mg by mouth as needed. Not smoking     No current facility-administered medications for this visit.     ALLERGIES: Codeine  Family History  Problem Relation Age of Onset  . Diabetes Paternal Grandmother   . Diabetes Paternal Grandfather   . Hypertension Mother   . Congestive Heart Failure Mother   . Thyroid disease Mother   . Hypertension Maternal Grandfather   . CVA Maternal Grandfather   . Stroke Father   . Heart attack Father 79  . Esophageal cancer Neg  Hx   . Rectal cancer Neg Hx   . Stomach cancer Neg Hx   . Colon cancer Neg Hx     Social History   Social History  . Marital Status: Widowed    Spouse Name: fred [place]  . Number of Children: N/A  . Years of Education: N/A   Occupational History  . Not on file.   Social History Main Topics  . Smoking status: Former Smoker -- 1.00 packs/day for 50 years    Types: Cigarettes    Start date: 03/08/1960    Quit date: 07/23/2010  . Smokeless tobacco: Never Used     Comment: quit smoking 3 years ago  . Alcohol Use: No  . Drug Use: No  . Sexual Activity: No   Other Topics Concern  . Not on file   Social History Narrative    ROS:  Pertinent items are noted in HPI.  PHYSICAL EXAMINATION:    BP 140/82 mmHg  Pulse 64  Ht 5\' 2"  (1.575 m)  Wt 119 lb 3.2 oz (54.069 kg)  BMI 21.80 kg/m2  LMP 02/16/1988 (Approximate)    General appearance: alert, cooperative and appears stated age.  Bright affect. H   Pelvic: External genitalia:  no lesions              Urethra:  normal appearing urethra with no masses, tenderness or lesions              Bartholins and Skenes: normal                 Vagina: vaginal apex with inflammation and bleeding mucosa from pessary.  Small amount of granulation tissue as well.  Foul smelling yellow green discharge.              Cervix: no lesions          Bimanual Exam:  Uterus:  normal size, contour, position, consistency, mobility, non-tender              Adnexa: normal adnexa and no mass, fullness, tenderness              Rectovaginal: No..     Pessary removed, cleansed, placed in biobag, and given to patient.   Chaperone was present for exam.  ASSESSMENT  Inflammation/ulceration from pessary use.  Vaginal granulation tissue.  Complete uterovaginal prolapse.  Hx of right breast hyperplasia.  PLAN  Counseled regarding vaginal findings.  Keep pessary out for 2 weeks.  Metrogel pv bid for 5 nights.  Return in 2 weeks and bring pessary for  expected placement.  May need to return more often for rechecks.  If has recurrent inflammation/ulceration, granulation tissue, may benefit from  placement of small amount of estrogen cream with pessary changes.   An After Visit Summary was printed and given to the patient.  ___15___ minutes face to face time of which over 50% was spent in counseling.

## 2015-02-28 ENCOUNTER — Ambulatory Visit: Payer: Self-pay | Admitting: Obstetrics and Gynecology

## 2015-03-12 ENCOUNTER — Ambulatory Visit (INDEPENDENT_AMBULATORY_CARE_PROVIDER_SITE_OTHER): Payer: Medicare Other | Admitting: Obstetrics and Gynecology

## 2015-03-12 ENCOUNTER — Encounter: Payer: Self-pay | Admitting: Obstetrics and Gynecology

## 2015-03-12 VITALS — BP 150/72 | HR 60 | Ht 62.0 in | Wt 117.6 lb

## 2015-03-12 DIAGNOSIS — N813 Complete uterovaginal prolapse: Secondary | ICD-10-CM | POA: Diagnosis not present

## 2015-03-12 NOTE — Progress Notes (Signed)
Patient ID: Ariel Holmes, female   DOB: 01/19/1941, 75 y.o.   MRN: [id number] GYNECOLOGY  VISIT   HPI: 75 y.o.   Widowed  Caucasian  female   G1P1001 with Patient's last menstrual period was 02/16/1988 (approximate).   here for follow up.    Used Metrogel for tx of vaginal inflammation from pessary.  Vaginal odor has resolved. No bleeding or discomfort.  Felt like the bladder stayed up for a little while after the pessary was removed.  Then bladder fell back down again.   Hx of breast hyperplasia.   GYNECOLOGIC HISTORY: Patient's last menstrual period was 02/16/1988 (approximate). Contraception:Postmenopausal Menopausal hormone therapy: None Last mammogram: 01-03-15 3D/Density Cat.B/additional views of Lt.breast--Neg/BiRads2/ret.to screening/[facility] Last pap smear: 2012 normal        OB History    Gravida Para Term Preterm AB TAB SAB Ectopic Multiple Living   '1 1 1       1         ' Patient Active Problem List   Diagnosis Date Noted  . 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 Cystocele 06/13/2013  . Ex-cigarette smoker 05/04/2013  . Asthmatic bronchitis 05/04/2013  . Melanoma of thigh ([place]) 02/15/2011  . ESOPHAGEAL REFLUX 03/24/2010  . 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 HYPERCHOLESTEROLEMIA, BORDERLINE, WITH HIGH HDL 01/18/2008  . Disorder of bone and cartilage 01/18/2008  . COLONIC POLYPS 01/17/2008  . Hypothyroidism 01/17/2008  . Anxiety state 01/17/2008  . MITRAL VALVE PROLAPSE, HX OF 01/17/2008    Past Medical History  Diagnosis Date  . MVP (mitral valve prolapse)   . Varicose vein   . Hypercholesterolemia   . Hypothyroidism   . Hx of colonic polyps   . Osteopenia   . COPD (chronic obstructive pulmonary disease) ([place])   . Melanoma ([place])     lt upper thigh  . Melanoma of thigh ([place]) 02/15/2011  . Melanoma of thigh ([place]) 2014    Left   . Prolapsed uterus     Past Surgical History  Procedure Laterality Date  . Breast surgery      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  Patient does not remember exact date of surgery.  . Tonsillectomy    . Colonoscopy    . Cesarean section  1965  . Melanoma excision  12/31/2010    Procedure: MELANOMA EXCISION;  Surgeon: Zenovia Jarred, MD;  Location: [facility];  Service: General;  Laterality: Left;  wide excision melanoma left thigh, excision lesion left thigh  . Other surgical history Right     Surgical repair of architectual distortion Right Breast- Hyperplasia    Current Outpatient Prescriptions  Medication Sig Dispense Refill  . acyclovir ointment (ZOVIRAX) 5 % See admin instructions.  0  . aspirin 81 MG tablet Take 81 mg by mouth daily.      . Calcium Carbonate-Vitamin D (CALCIUM + D PO) Take by mouth daily.      . Cholecalciferol (VITAMIN D) 2000 UNITS CAPS Take 1 capsule by mouth daily.    . cimetidine (TAGAMET) 400 MG tablet Take 400 mg by mouth as needed.    . fluticasone (FLONASE) 50 MCG/ACT nasal spray PLACE 2 SPRAYS INTO BOTH NOSTRILS AS NEEDED. 16 g 1  . levothyroxine (SYNTHROID, LEVOTHROID) 100 MCG tablet TAKE 1 TABLET (100 MCG TOTAL) BY MOUTH DAILY. 90 tablet 1  . LUTEIN PO Take 1 tablet by mouth daily.    . Multiple Vitamin (MULTIVITAMIN) tablet Take 1 tablet by mouth daily.      [name]  nicotine polacrilex (NICORETTE) 2 MG gum Take 2 mg by mouth as needed. Not smoking     No current facility-administered medications for this visit.     ALLERGIES: Codeine  Family History  Problem Relation Age of Onset  . Diabetes Paternal Grandmother   . Diabetes Paternal Grandfather   . Hypertension Mother   . Congestive Heart Failure Mother   . Thyroid disease Mother   . Hypertension Maternal Grandfather   . CVA Maternal Grandfather   . Stroke Father   . Heart attack Father 4  . Esophageal cancer Neg Hx   . Rectal cancer Neg Hx   . Stomach cancer Neg Hx   . Colon cancer Neg Hx     Social History   Social History  . Marital Status: Widowed    Spouse Name: fred [place]  . Number of  Children: N/A  . Years of Education: N/A   Occupational History  . Not on file.   Social History Main Topics  . Smoking status: Former Smoker -- 1.00 packs/day for 50 years    Types: Cigarettes    Start date: 03/08/1960    Quit date: 07/23/2010  . Smokeless tobacco: Never Used     Comment: quit smoking 3 years ago  . Alcohol Use: No  . Drug Use: No  . Sexual Activity: No   Other Topics Concern  . Not on file   Social History Narrative    ROS:  Pertinent items are noted in HPI.  PHYSICAL EXAMINATION:    BP 150/72 mmHg  Pulse 60  Ht '5\' 2"'  (1.575 m)  Wt 117 lb 9.6 oz (53.343 kg)  BMI 21.50 kg/m2  LMP 02/16/1988 (Approximate)    General appearance: alert, cooperative and appears stated age   Pelvic: External genitalia:  no lesions              Urethra:  normal appearing urethra with no masses, tenderness or lesions              Bartholins and Skenes: normal                 Vagina: normal appearing vagina with normal color and discharge, no lesions.  No granulation tissue or ulceration.  Healthy mucosa.               Cervix: no lesions               Bimanual Exam:  Uterus:  normal size, contour, position, consistency, mobility, non-tender              Adnexa: normal adnexa and no mass, fullness, tenderness           Patient's pessary replaced with water based lubricant.       Chaperone was present for exam.  ASSESSMENT  Complete uterovaginal prolapse.  Granulation tissue and inflammation resolved with pessary out and course of Metrogel.  Hx of breast hyperplasia.  Declines estrogen tx.   PLAN  Discussed pessary use and importance of period removal, of which the patient agrees.  Will have patient return in one month for a pessary check.  She is hesitant to care for it herself.    An After Visit Summary was printed and given to the patient.  __15____ minutes face to face time of which over 50% was spent in counseling.

## 2015-04-09 ENCOUNTER — Encounter: Payer: Self-pay | Admitting: Obstetrics and Gynecology

## 2015-04-09 ENCOUNTER — Ambulatory Visit (INDEPENDENT_AMBULATORY_CARE_PROVIDER_SITE_OTHER): Payer: Medicare Other | Admitting: Obstetrics and Gynecology

## 2015-04-09 VITALS — BP 132/72 | HR 66 | Ht 62.0 in | Wt 117.4 lb

## 2015-04-09 DIAGNOSIS — T8369XS Infection and inflammatory reaction due to other prosthetic device, implant and graft in genital tract, sequela: Secondary | ICD-10-CM | POA: Diagnosis not present

## 2015-04-09 DIAGNOSIS — N813 Complete uterovaginal prolapse: Secondary | ICD-10-CM

## 2015-04-09 DIAGNOSIS — N760 Acute vaginitis: Secondary | ICD-10-CM | POA: Diagnosis not present

## 2015-04-09 DIAGNOSIS — T8369XD Infection and inflammatory reaction due to other prosthetic device, implant and graft in genital tract, subsequent encounter: Secondary | ICD-10-CM

## 2015-04-09 MED ORDER — ESTROGENS, CONJUGATED 0.625 MG/GM VA CREA: TOPICAL_CREAM | VAGINAL | Status: DC

## 2015-04-09 NOTE — Progress Notes (Signed)
Patient ID: Ariel Holmes, female   DOB: [birth year], 75 y.o.   MRN: [id number] GYNECOLOGY  VISIT   HPI: 75 y.o.   Widowed  Caucasian  female   G1P1001 with Patient's last menstrual period was 02/16/1988 (approximate).   here for pessary check.  No bleeding, pain, or odor.   Walks 3 miles per day.   Hx of atypical lobular hyperplasia of the breast.  Status post excisional biopsy of the right breast.   GYNECOLOGIC HISTORY: Patient's last menstrual period was 02/16/1988 (approximate). Contraception:Postmenopausal Menopausal hormone therapy: None Last mammogram: 01-03-15 3D/Density Cat.B/additional views of Lt.breast--Neg/BiRads2/ret.to screening/[facility] Last pap smear: 2012 normal        OB History    Gravida Para Term Preterm AB TAB SAB Ectopic Multiple Living   '1 1 1       1         ' Patient Active Problem List   Diagnosis Date Noted  . 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 Cystocele 06/13/2013  . Ex-cigarette smoker 05/04/2013  . Asthmatic bronchitis 05/04/2013  . Melanoma of thigh ([place]) 02/15/2011  . ESOPHAGEAL REFLUX 03/24/2010  . 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 HYPERCHOLESTEROLEMIA, BORDERLINE, WITH HIGH HDL 01/18/2008  . Disorder of bone and cartilage 01/18/2008  . COLONIC POLYPS 01/17/2008  . Hypothyroidism 01/17/2008  . Anxiety state 01/17/2008  . MITRAL VALVE PROLAPSE, HX OF 01/17/2008    Past Medical History  Diagnosis Date  . MVP (mitral valve prolapse)   . Varicose vein   . Hypercholesterolemia   . Hypothyroidism   . Hx of colonic polyps   . Osteopenia   . COPD (chronic obstructive pulmonary disease) ([place])   . Melanoma ([place])     lt upper thigh  . Melanoma of thigh ([place]) 02/15/2011  . Melanoma of thigh ([place]) 2014    Left   . Prolapsed uterus     Past Surgical History  Procedure Laterality Date  . Breast surgery      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 Patient does not remember exact date of surgery.  . Tonsillectomy    . Colonoscopy    . Cesarean  section  1965  . Melanoma excision  12/31/2010    Procedure: MELANOMA EXCISION;  Surgeon: Zenovia Jarred, MD;  Location: [facility];  Service: General;  Laterality: Left;  wide excision melanoma left thigh, excision lesion left thigh  . Other surgical history Right     Surgical repair of architectual distortion Right Breast- Hyperplasia    Current Outpatient Prescriptions  Medication Sig Dispense Refill  . acyclovir ointment (ZOVIRAX) 5 % See admin instructions.  0  . aspirin 81 MG tablet Take 81 mg by mouth daily.      . Calcium Carbonate-Vitamin D (CALCIUM + D PO) Take by mouth daily.      . Cholecalciferol (VITAMIN D) 2000 UNITS CAPS Take 1 capsule by mouth daily.    . cimetidine (TAGAMET) 400 MG tablet Take 400 mg by mouth as needed.    . fluticasone (FLONASE) 50 MCG/ACT nasal spray PLACE 2 SPRAYS INTO BOTH NOSTRILS AS NEEDED. 16 g 1  . levothyroxine (SYNTHROID, LEVOTHROID) 100 MCG tablet TAKE 1 TABLET (100 MCG TOTAL) BY MOUTH DAILY. 90 tablet 1  . LUTEIN PO Take 1 tablet by mouth daily.    . Multiple Vitamin (MULTIVITAMIN) tablet Take 1 tablet by mouth daily.      . nicotine polacrilex (NICORETTE) 2 MG gum Take 2 mg by mouth as needed. Not smoking  No current facility-administered medications for this visit.     ALLERGIES: Codeine  Family History  Problem Relation Age of Onset  . Diabetes Paternal Grandmother   . Diabetes Paternal Grandfather   . Hypertension Mother   . Congestive Heart Failure Mother   . Thyroid disease Mother   . Hypertension Maternal Grandfather   . CVA Maternal Grandfather   . Stroke Father   . Heart attack Father 29  . Esophageal cancer Neg Hx   . Rectal cancer Neg Hx   . Stomach cancer Neg Hx   . Colon cancer Neg Hx     Social History   Social History  . Marital Status: Widowed    Spouse Name: fred Mattila  . Number of Children: N/A  . Years of Education: N/A   Occupational History  . Not on file.   Social History Main  Topics  . Smoking status: Former Smoker -- 1.00 packs/day for 50 years    Types: Cigarettes    Start date: 03/08/1960    Quit date: 07/23/2010  . Smokeless tobacco: Never Used     Comment: quit smoking 3 years ago  . Alcohol Use: No  . Drug Use: No  . Sexual Activity: No   Other Topics Concern  . Not on file   Social History Narrative    ROS:  Pertinent items are noted in HPI.  PHYSICAL EXAMINATION:    BP 132/72 mmHg  Pulse 66  Ht '5\' 2"'  (1.575 m)  Wt 117 lb 6.4 oz (53.252 kg)  BMI 21.47 kg/m2  LMP 02/16/1988 (Approximate)    General appearance: alert, cooperative and appears stated age    Pelvic: External genitalia:  no lesions              Urethra:  normal appearing urethra with no masses, tenderness or lesions              Bartholins and Skenes: normal                 Vagina:  3 cm area of inflammation of posterior vaginal wall.  Friable tissue.               Cervix: no lesions                Bimanual Exam:  Uterus:  normal size, contour, position, consistency, mobility, non-tender              Adnexa: normal adnexa and no mass, fullness, tenderness               Pessary removed, cleansed, and given to patient in a biobag. Chaperone was present for exam.  ASSESSMENT  Complete uterovaginal prolapse.  Recurrent vaginal inflammation from pessary use. Hx of breast hyperplasia.   PLAN  Counseled regarding inflammatory change from pessary.  Options of use of very small amount of vaginal estrogen, new pessary - smaller, and surgery discussed.  I did discuss the the vaginal estrogen use would be very small with each pessary change.  We did discuss risks of DVT, PE, MI, stroke, breast cancer.  She agrees to use of Premarin cream.  Coupon given.  Follow up in about 14 days for pessary insertion with small amount of Premarin cream.   An After Visit Summary was printed and given to the patient.  __15____ minutes face to face time of which over 50% was spent in  counseling.

## 2015-04-21 ENCOUNTER — Ambulatory Visit (INDEPENDENT_AMBULATORY_CARE_PROVIDER_SITE_OTHER): Payer: Medicare Other | Admitting: Obstetrics and Gynecology

## 2015-04-21 ENCOUNTER — Encounter: Payer: Self-pay | Admitting: Obstetrics and Gynecology

## 2015-04-21 VITALS — BP 160/70 | HR 76 | Ht 62.0 in | Wt 117.4 lb

## 2015-04-21 DIAGNOSIS — N760 Acute vaginitis: Secondary | ICD-10-CM

## 2015-04-21 DIAGNOSIS — T8369XS Infection and inflammatory reaction due to other prosthetic device, implant and graft in genital tract, sequela: Secondary | ICD-10-CM | POA: Diagnosis not present

## 2015-04-21 DIAGNOSIS — N813 Complete uterovaginal prolapse: Secondary | ICD-10-CM | POA: Diagnosis not present

## 2015-04-21 DIAGNOSIS — T8369XD Infection and inflammatory reaction due to other prosthetic device, implant and graft in genital tract, subsequent encounter: Secondary | ICD-10-CM

## 2015-04-21 NOTE — Progress Notes (Signed)
Patient ID: Ariel Holmes, female   DOB: May 04, 1940, 75 y.o.   MRN: [id number] GYNECOLOGY  VISIT   HPI: 75 y.o.   Widowed  Caucasian  female   G1P1001 with Patient's last menstrual period was 02/16/1988 (approximate).   here for follow up.  Has vaginal inflammation from pessary use.   Received Rx for Premarin vaginal cream at last visit.  She has not used any on her own.   Pessary does not fall out ever and patient does not feel it.   Doing acupuncture for her shoulder. Able to turn her head better.    GYNECOLOGIC HISTORY: Patient's last menstrual period was 02/16/1988 (approximate). Contraception:Postmenopausal Menopausal hormone therapy: Premain vaginal cream Last mammogram: 01-03-15 3D/Density Cat.B/additional views of Lt.Breast--Neg/BiRads2/Ret.to screening:[facility] Last pap smear: 2012 normal        OB History    Gravida Para Term Preterm AB TAB SAB Ectopic Multiple Living   '1 1 1       1         ' Patient Active Problem List   Diagnosis Date Noted  . 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 Cystocele 06/13/2013  . Ex-cigarette smoker 05/04/2013  . Asthmatic bronchitis 05/04/2013  . 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 ESOPHAGEAL REFLUX 03/24/2010  . 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 HYPERCHOLESTEROLEMIA, BORDERLINE, WITH HIGH HDL 01/18/2008  . Disorder of bone and cartilage 01/18/2008  . COLONIC POLYPS 01/17/2008  . Hypothyroidism 01/17/2008  . Anxiety state 01/17/2008  . MITRAL VALVE PROLAPSE, HX OF 01/17/2008    Past Medical History  Diagnosis Date  . MVP (mitral valve prolapse)   . Varicose vein   . Hypercholesterolemia   . Hypothyroidism   . Hx of colonic polyps   . Osteopenia   . COPD (chronic obstructive pulmonary disease) ([place])   . Melanoma ([place])     lt upper thigh  . 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 Prolapsed uterus     Past Surgical History  Procedure Laterality Date  . Breast surgery      pre cancerous  removed from right breast. Patient does not remember exact date of surgery.  . Tonsillectomy    . Colonoscopy    . Cesarean section  1965  . Melanoma excision  12/31/2010    Procedure: MELANOMA EXCISION;  Surgeon: Zenovia Jarred, MD;  Location: [facility];  Service: General;  Laterality: Left;  wide excision melanoma left thigh, excision lesion left thigh  . Other surgical history Right     Surgical repair of architectual distortion Right Breast- Hyperplasia    Current Outpatient Prescriptions  Medication Sig Dispense Refill  . acyclovir ointment (ZOVIRAX) 5 % See admin instructions.  0  . aspirin 81 MG tablet Take 81 mg by mouth daily.      . Calcium Carbonate-Vitamin D (CALCIUM + D PO) Take by mouth daily.      . Cholecalciferol (VITAMIN D) 2000 UNITS CAPS Take 1 capsule by mouth daily.    . cimetidine (TAGAMET) 400 MG tablet Take 400 mg by mouth as needed.    . conjugated estrogens (PREMARIN) vaginal cream Use 1/2 g vaginally with each pessary cleaning. 30 g 1  . fluticasone (FLONASE) 50 MCG/ACT nasal spray PLACE 2 SPRAYS INTO BOTH NOSTRILS AS NEEDED. 16 g 1  . levothyroxine (SYNTHROID, LEVOTHROID) 100 MCG tablet TAKE 1 TABLET (100 MCG TOTAL) BY MOUTH DAILY. 90 tablet 1  . LUTEIN  PO Take 1 tablet by mouth daily.    . Multiple Vitamin (MULTIVITAMIN) tablet Take 1 tablet by mouth daily.      . nicotine polacrilex (NICORETTE) 2 MG gum Take 2 mg by mouth as needed. Not smoking     No current facility-administered medications for this visit.     ALLERGIES: Codeine  Family History  Problem Relation Age of Onset  . Diabetes Paternal Grandmother   . Diabetes Paternal Grandfather   . Hypertension Mother   . Congestive Heart Failure Mother   . Thyroid disease Mother   . Hypertension Maternal Grandfather   . CVA Maternal Grandfather   . Stroke Father   . Heart attack Father 44  . Esophageal cancer Neg Hx   . Rectal cancer Neg Hx   . Stomach cancer Neg Hx   . Colon  cancer Neg Hx     Social History   Social History  . Marital Status: Widowed    Spouse Name: fred Wertenberger  . Number of Children: N/A  . Years of Education: N/A   Occupational History  . Not on file.   Social History Main Topics  . Smoking status: Former Smoker -- 1.00 packs/day for 50 years    Types: Cigarettes    Start date: 03/08/1960    Quit date: 07/23/2010  . Smokeless tobacco: Never Used     Comment: quit smoking 3 years ago  . Alcohol Use: No  . Drug Use: No  . Sexual Activity: No   Other Topics Concern  . Not on file   Social History Narrative    ROS:  Pertinent items are noted in HPI.  PHYSICAL EXAMINATION:    Ht '5\' 2"'  (1.575 m)  Wt 117 lb 6.4 oz (53.252 kg)  BMI 21.47 kg/m2  LMP 02/16/1988 (Approximate)    General appearance: alert, cooperative and appears stated age   Pelvic: External genitalia:  no lesions              Urethra:  normal appearing urethra with no masses, tenderness or lesions              Bartholins and Skenes: normal                 Vagina: normal appearing vagina with normal color and discharge, no lesions.  Inflammation of the posterior vaginal mucosa is now significantly smaller.  No bleeding.               Cervix: no lesions                Bimanual Exam:  Uterus:  normal size, contour, position, consistency, mobility, non-tender  Premarin 1/2 gram placed at vaginal apex.                Adnexa: normal adnexa and no mass, fullness, tenderness              Chaperone was present for exam.  ASSESSMENT  Vaginal inflammation from pessary use. Improved without using the pessary.  PLAN  Counseled regarding vaginal inflammation.  Return in one week with Premarin cream and pessary and will replace it then.    An After Visit Summary was printed and given to the patient.  __15____ minutes face to face time of which over 50% was spent in counseling.

## 2015-04-30 ENCOUNTER — Encounter: Payer: Self-pay | Admitting: Obstetrics and Gynecology

## 2015-04-30 ENCOUNTER — Ambulatory Visit (INDEPENDENT_AMBULATORY_CARE_PROVIDER_SITE_OTHER): Payer: Medicare Other | Admitting: Obstetrics and Gynecology

## 2015-04-30 VITALS — BP 130/76 | HR 60 | Ht 62.0 in | Wt 118.0 lb

## 2015-04-30 DIAGNOSIS — N813 Complete uterovaginal prolapse: Secondary | ICD-10-CM

## 2015-04-30 NOTE — Progress Notes (Signed)
Patient ID: Ariel Holmes, female   DOB: [birth year], 75 y.o.   MRN: [id number] GYNECOLOGY  VISIT   HPI: 75 y.o.   Widowed  Caucasian  female   G1P1001 with Patient's last menstrual period was 02/16/1988 (approximate).   here for 1 week pessary check.    Has vaginal erythema from pessary use.  Placed Premarin cream last visit and left pessary out.  Plan to place pessary back today.  Patient brings pessary and Premarin with her today.   GYNECOLOGIC HISTORY: Patient's last menstrual period was 02/16/1988 (approximate). Contraception:Postmenopausal Menopausal hormone therapy: Premarin vaginal cream Last mammogram: 01-03-15 3D/Density Cat.B/additional views of Lt.Breast--Neg/BiRads2/Ret.to screening:[facility]. Last pap smear: 2012 normal        OB History    Gravida Para Term Preterm AB TAB SAB Ectopic Multiple Living   '1 1 1       1         ' Patient Active Problem List   Diagnosis Date Noted  . 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 Cystocele 06/13/2013  . Ex-cigarette smoker 05/04/2013  . Asthmatic bronchitis 05/04/2013  . 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 ESOPHAGEAL REFLUX 03/24/2010  . 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 HYPERCHOLESTEROLEMIA, BORDERLINE, WITH HIGH HDL 01/18/2008  . Disorder of bone and cartilage 01/18/2008  . COLONIC POLYPS 01/17/2008  . Hypothyroidism 01/17/2008  . Anxiety state 01/17/2008  . MITRAL VALVE PROLAPSE, HX OF 01/17/2008    Past Medical History  Diagnosis Date  . MVP (mitral valve prolapse)   . Varicose vein   . Hypercholesterolemia   . Hypothyroidism   . Hx of colonic polyps   . Osteopenia   . COPD (chronic obstructive pulmonary disease) ([place])   . Melanoma ([place])     lt upper thigh  . 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 Prolapsed uterus     Past Surgical History  Procedure Laterality Date  . Breast surgery      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 Patient does not remember exact date of surgery.  .  Tonsillectomy    . Colonoscopy    . Cesarean section  1965  . Melanoma excision  12/31/2010    Procedure: MELANOMA EXCISION;  Surgeon: Zenovia Jarred, MD;  Location: [facility];  Service: General;  Laterality: Left;  wide excision melanoma left thigh, excision lesion left thigh  . Other surgical history Right     Surgical repair of architectual distortion Right Breast- Hyperplasia    Current Outpatient Prescriptions  Medication Sig Dispense Refill  . acyclovir ointment (ZOVIRAX) 5 % See admin instructions.  0  . aspirin 81 MG tablet Take 81 mg by mouth daily.      . Calcium Carbonate-Vitamin D (CALCIUM + D PO) Take by mouth daily.      . Cholecalciferol (VITAMIN D) 2000 UNITS CAPS Take 1 capsule by mouth daily.    . cimetidine (TAGAMET) 400 MG tablet Take 400 mg by mouth as needed.    . conjugated estrogens (PREMARIN) vaginal cream Use 1/2 g vaginally with each pessary cleaning. 30 g 1  . fluticasone (FLONASE) 50 MCG/ACT nasal spray PLACE 2 SPRAYS INTO BOTH NOSTRILS AS NEEDED. 16 g 1  . levothyroxine (SYNTHROID, LEVOTHROID) 100 MCG tablet TAKE 1 TABLET (100 MCG TOTAL) BY MOUTH DAILY. 90 tablet 1  . LUTEIN PO Take 1 tablet by mouth daily.    . Multiple Vitamin (MULTIVITAMIN) tablet Take 1 tablet  by mouth daily.      . nicotine polacrilex (NICORETTE) 2 MG gum Take 2 mg by mouth as needed. Not smoking     No current facility-administered medications for this visit.     ALLERGIES: Codeine  Family History  Problem Relation Age of Onset  . Diabetes Paternal Grandmother   . Diabetes Paternal Grandfather   . Hypertension Mother   . Congestive Heart Failure Mother   . Thyroid disease Mother   . Hypertension Maternal Grandfather   . CVA Maternal Grandfather   . Stroke Father   . Heart attack Father 36  . Esophageal cancer Neg Hx   . Rectal cancer Neg Hx   . Stomach cancer Neg Hx   . Colon cancer Neg Hx     Social History   Social History  . Marital Status:  Widowed    Spouse Name: fred Bringle  . Number of Children: N/A  . Years of Education: N/A   Occupational History  . Not on file.   Social History Main Topics  . Smoking status: Former Smoker -- 1.00 packs/day for 50 years    Types: Cigarettes    Start date: 03/08/1960    Quit date: 07/23/2010  . Smokeless tobacco: Never Used     Comment: quit smoking 3 years ago  . Alcohol Use: No  . Drug Use: No  . Sexual Activity: No   Other Topics Concern  . Not on file   Social History Narrative    ROS:  Pertinent items are noted in HPI.  PHYSICAL EXAMINATION:    BP 130/76 mmHg  Pulse 60  Ht '5\' 2"'  (1.575 m)  Wt 118 lb (53.524 kg)  BMI 21.58 kg/m2  LMP 02/16/1988 (Approximate)    General appearance: alert, cooperative and appears stated age.  Bright and cheerful.  Pelvic: External genitalia:  no lesions              Urethra:  normal appearing urethra with no masses, tenderness or lesions              Bartholins and Skenes: normal                 Vagina: normal appearing vagina with normal color and discharge, no lesions.  Very mild erythema of the posterior vaginal apex.  Area about 2 cm.  No ulceration.               Cervix: no lesions               Bimanual Exam:  Uterus:  normal size, contour, position, consistency, mobility, non-tender              Adnexa: normal adnexa and no mass, fullness, tenderness             Pessary ring with support place in vagina with 1/2 gram of Premarin vaginal cream.   Chaperone was present for exam.  ASSESSMENT  Complete uterovaginal prolapse. Vaginal inflammation improved. Hx of breast atypical hyperplasia.   PLAN  Discussed vaginal inflammation.  Continue pessary use with Premarin 1/2 gram with pessary changes.  We are using the Premarin very sparingly. Follow up in about 7 weeks.   An After Visit Summary was printed and given to the patient.  _15_____ minutes face to face time of which over 50% was spent in counseling.

## 2015-05-12 ENCOUNTER — Other Ambulatory Visit: Payer: Self-pay | Admitting: Pulmonary Disease

## 2015-06-11 ENCOUNTER — Ambulatory Visit: Payer: Medicare Other | Admitting: Pulmonary Disease

## 2015-06-12 ENCOUNTER — Ambulatory Visit (INDEPENDENT_AMBULATORY_CARE_PROVIDER_SITE_OTHER): Payer: Medicare Other | Admitting: Pulmonary Disease

## 2015-06-12 ENCOUNTER — Encounter: Payer: Self-pay | Admitting: Pulmonary Disease

## 2015-06-12 ENCOUNTER — Ambulatory Visit (INDEPENDENT_AMBULATORY_CARE_PROVIDER_SITE_OTHER)
Admission: RE | Admit: 2015-06-12 | Discharge: 2015-06-12 | Disposition: A | Discharge status: Home / Self Care | Payer: Medicare Other | Source: Ambulatory Visit | Attending: Pulmonary Disease | Admitting: Pulmonary Disease

## 2015-06-12 ENCOUNTER — Encounter: Payer: Self-pay | Admitting: Internal Medicine

## 2015-06-12 ENCOUNTER — Other Ambulatory Visit (INDEPENDENT_AMBULATORY_CARE_PROVIDER_SITE_OTHER): Payer: Medicare Other

## 2015-06-12 VITALS — BP 132/70 | HR 59 | Temp 97.5°F | Ht 62.0 in | Wt 116.2 lb

## 2015-06-12 DIAGNOSIS — E7800 Pure hypercholesterolemia, unspecified: Secondary | ICD-10-CM

## 2015-06-12 DIAGNOSIS — I868 Varicose veins of other specified sites: Secondary | ICD-10-CM

## 2015-06-12 DIAGNOSIS — Z23 Encounter for immunization: Secondary | ICD-10-CM

## 2015-06-12 DIAGNOSIS — D126 Benign neoplasm of colon, unspecified: Secondary | ICD-10-CM

## 2015-06-12 DIAGNOSIS — J4520 Mild intermittent asthma, uncomplicated: Secondary | ICD-10-CM

## 2015-06-12 DIAGNOSIS — E039 Hypothyroidism, unspecified: Secondary | ICD-10-CM

## 2015-06-12 DIAGNOSIS — Z87891 Personal history of nicotine dependence: Secondary | ICD-10-CM

## 2015-06-12 DIAGNOSIS — I8390 Asymptomatic varicose veins of unspecified lower extremity: Secondary | ICD-10-CM

## 2015-06-12 DIAGNOSIS — F411 Generalized anxiety disorder: Secondary | ICD-10-CM

## 2015-06-12 DIAGNOSIS — C4372 Malignant melanoma of left lower limb, including hip: Secondary | ICD-10-CM

## 2015-06-12 DIAGNOSIS — K219 Gastro-esophageal reflux disease without esophagitis: Secondary | ICD-10-CM

## 2015-06-12 LAB — LIPID PANEL
CHOL/HDL RATIO: 2
Cholesterol: 200 mg/dL (ref 0–200)
HDL: 87.2 mg/dL (ref >39.00)
LDL Cholesterol: 98 mg/dL (ref 0–99)
NONHDL: 112.58
Triglycerides: 74 mg/dL (ref 0.0–149.0)
VLDL: 14.8 mg/dL (ref 0.0–40.0)

## 2015-06-12 LAB — TSH: TSH: 0.28 u[IU]/mL — AB (ref 0.35–4.50)

## 2015-06-12 NOTE — Patient Instructions (Signed)
Today we updated your med list in our EPIC system...    Continue your current medications the same...    We refilled meds per request...  We gave you the PREVNAR-13 pneumonia vaccine- (one and done!)...  Today we did your FASTING blood work & a  Follow up CXR...    We will contact you w/ the results when available...   Call for any questions...  GET A PUPPY  Let's plan a follow up visit in 59yr, sooner if needed for problems.Ariel KitchenMarland Holmes

## 2015-06-12 NOTE — Progress Notes (Signed)
Subjective:     Patient ID: Ariel Holmes, female   DOB: [birth year], 75 y.o.   MRN: [id number]  HPI 75 y/o WF here for a follow up visit... she has multiple medical problems as noted below...   ~  SEE PREV EPIC NOTES FOR OLDER DATA >>    CXR 3/13 showed heart is wnl, lungs clear x sl incr markings, NAD.[name]  LABS 3/13:  FLP- Improved on diet;  Chems- wnl;  CBC- wnl;  TSH=0.71 on Levothy100/d...  LABS 11/13:  Chems- wnl;  CBC- wnl...  CXR 3/14 showed normal heart size, calcif & ectasia of Ao, clear lungs, DJD/ sl scoliosis of spine/ osteopenia...  LABS 3/14:  FLP- not at goals on diet alone but hi HDL;  TSH=0.42;  VitD=60   ~  May 04, 2013:  Yearly ROV & Ariel Holmes is stable, under a lot of stress w/ husb's long illness; she's noted that BPs have been elev occas at home & there is a fam hx of HBP, BP here= 130/80- we discussed R&R, no salt, consider anxiolytic if nec...    AR, AB, ex-smoker> on OTC Antihist, Flonase, & Nicorette; she quit smoking 6/12 but still using Nicotine replacement...    MVP> on ASA81; she denies CP, palpit, SOB, edema, etc...    Ven Insuffic> she knows to avoid sodium, elev legs, wear support hose...    Chol> on diet alone esp w/ her good HDL; Labs 3/15 showed TChol 209, TG 78, HDL 83, LDL 105    Hypothy> on Synthroid100; Labs 3/15 showed TSH= 0.43; she remains clinically & biochem euthyroid...    GI- GERD, Polyps, Hems> stable w/o abd pain, dysphagia, n/v, c/d, blood seen; last colon 5/10 w/ divertics, 2 adenomas removed, f/u due 5/15...    Osteopenia> on Calcium, MVI, VitD2000; she stopped prev Fosamax Rx yrs ago- last BMD 2009 w/ Tscore -1.5 in [facility]...    Anxiety> under stress w/ husb illness but declines anxiolytic meds...    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 We reviewed prob list, meds, xrays and labs> see below for updates >>   CXR 3/15 showed norm heart size, clear lungs,  mild DJD spine, no lesions/ NAD...  PFT 3/15 showed FVC=2.00 (76%), FEV1=1.56 (77%), %1sec=78%, mid-flows=75% predicted... Interpret: No signif obstruction, can't r/o mild restriction w/o lung vol measurement- SM[facility]  BMD 3/15 showed TScore -0.5 in Spine and -1.4 in [facility]; Rec to continue Calcium, MVI, VitD & exercise...  ~  June 11, 2014:  80mo ROV & Ariel Holmes has had a rough time since Ariel Holmes passed away 2014-03-06; she works Scientist, research (physical sciences) for [employer] & that has helped keeping her busy,,, she is c/o some nasal congestion, drainage, & blowing beige mucus; draining to her chest but denies hemooptysis, SOB, CP, f/c/s; she is fatigued and achey- using Flonase and Saline... She quit smoking 4 yrs ago & still chews nicorette gum- we discussed weaning this down already! We reviewed the following medical problems during today's office visit >>     AR, AB, ex-smoker> on OTC Antihist, Flonase, & Nicorette; she quit smoking 6/12 but still using Nicotine replacement & we discussed weaning this "medication"...    MVP> on ASA81; she denies CP, palpit, SOB, edema, etc; she is active & doing satis...    Ven Insuffic> she knows to avoid sodium, elev legs, wear support hose...    Chol> on diet alone esp w/ her good HDL;  Labs 4/16 showed TChol 196, TG 93, HDL 76, LDL 101    Hypothy> on Synthroid100; Labs 4/16 showed TSH= 0.89; she remains clinically & biochem euthyroid...    GI- GERD, Polyps, Hems> stable w/o abd pain, dysphagia, n/v, c/d, blood seen; last colon 5/15 by DrDBrodie showed divertics in sigmoid, melanosis, no recurrent polyps (she rec f/u 10 yrs).    Osteopenia> on Calcium, MVI, VitD2000; she stopped prev Fosamax Rx yrs ago- last BMD 2009 w/ Tscore -1.5 in [facility]...    Anxiety> husb Ariel Holmes passed away 03-02-2014 after a long illness, she is doing satis & declines anxiolytic meds...    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 We reviewed prob list, meds, xrays and labs> see below for updates >>   CXR 05/2014 showed norm heart size, clear lungs, NAD...   LABS 4/16:  FLP- at goals on diet;  TSH=.89;  Prev Chems/ CBC 01/2014 were wnl...   ~  June 12, 2015:  33yr ROV & Ariel Holmes has had some interval GYN problems w/ prolapsed uterus "my insides fell out" & currently managed w/ a pessary- changed every 38mo & she is holding off on surg consideration (she did see Urology for PT, exercises);  She quit smoking 5 yrs ago & I note that she is still using nicorette gum daily!  She notes that her breathing is OK, no cough, sputum, hemoptysis, and denies SOB, states she's walking 3-4 mi per day w/ friends & denies problems;  She had some neck/ shoulder pain & managed this w/ massage therapy & accupuncture/ "cupping" which really helped she says...    AR, AB, ex-smoker> on OTC Antihist, Flonase, & Nicorette; she quit smoking 6/12 but still using Nicotine replacement & we discussed weaning this "medication"; breathing is good, exercising daily.    MVP> on ASA81; she denies CP, palpit, SOB, edema, etc; she is active & doing satis...    Ven Insuffic> she knows to avoid sodium, elev legs, wear support hose...    Chol> on diet alone esp w/ her good HDL; Labs 4/17 showed TChol 200, TG 74, HDL 87, LDL 98    Hypothy> on Synthroid100; Labs 4/17 showed TSH= 0.28; she remains clinically stable but wt is down to 116# so we decided to decr the Synthroid to 4mcg/d.    GI- GERD, Polyps, Hems> stable w/o abd pain, dysphagia, n/v, c/d, blood seen; last colon 5/15 by DrDBrodie showed divertics in sigmoid, melanosis, no recurrent polyps (she rec f/u 10 yrs).    GYN> followed by DrSilva w/ uterine proalpse, she has a pessary (cleaned & inspected Q72mo) and she as seen Urology for PT & kaegel exercises...    Osteopenia> on Calcium, MVI, VitD2000; she stopped prev Fosamax Rx yrs ago- last BMD 2009 w/ Tscore -1.5 in  [facility]...    Anxiety> husb Ariel Holmes passed away 03/02/14 after a long illness, she is doing satis & declines anxiolytic meds...    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 EXAM shows Afeb, VSS, O2sat=97% on RA;  HEENT- neg, mallampati1;  Chest- clear w/o w/r/r;  Heart- RR w/o m/r/g;  Abd- soft, nontender, neg;  Ext- w/o c/c/e;  Neuro- intact...  CXR 06/12/15>  Norm heart size,clear lungs w/ sl incr markings, NAD, DDD in Tspine...  LABS 01/2015 by DrEnnever>  Chems- wnl;  CBC- ok w/  Hg=12.1;  Vit D=61...  LABS 06/12/15>  FLP- all parameters at goals on diet alone;  TSH= 0.28 on Synthroid100 IMP/PLAN>>  Ariel Holmes is stable overall, has quit smoking 5 yrs ago but still using nicorette daily- again asked to wean off;  Her TSH is sl oversuppressed on Synthroid100 & wt is down to 116# so we will decr dose to 55mcg/d & plan recheck TFTs;  She was given PREVNAR-13 today;  We plan ROV in 43yr, sooner if needed prn...          PROBLEM LIST:    ALLERGIC RHINITIS - uses FLONASE Prn...  ASTHMATIC BRONCHITIS, ACUTE (ICD-466.0) - no recent bronchitic exac... not on regular meds. ~  CXR 12/09 showed hyperinflation & incr markings, spondylosis in TSpine... 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 ~  CXR 3/13 showed heart size is wnl, lungs clear x sl incr markings, NAD. ~  CXR 3/14 showed normal heart size, calcif & ectasia of Ao, clear lungs, DJD/ sl scoliosis of spine/ osteopenia. ~  CXR 3/15 showed norm heart size, clear lungs, mild DJD spine, no lesions/ NAD. ~  PTF 3/15 showed FVC=2.00 (76%), FEV1=1.56 (77%), %1sec=78%, mid-flows=75% predicted... Interpret: No signif obstruction, can't r/o mild restriction w/o lung vol measurement ~  CXR 4/16 showed norm heart size, clear lungs, NAD. 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 ~  Still using nicorette gum daily 7 asked to wean down...  MITRAL VALVE PROLAPSE, HX OF (ICD-V12.50) - on ASA 81mg /d... hx mild MVP on 2DEcho in 1995 (no signif MR)... asymptomatic without CP or palpit... 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 she knows to avoid sodium, elevate legs, wear support hose, etc...  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 Keep up the good work. ~  [place] 3/14 on diet alone showed TChol 228, TG 92, HDL 88, LDL 109 ~  FLP 3/15 on diet alone showed TChol 209, TG 78, HDL 83, LDL 105  ~  FLP 4/16 on diet alone showed TChol 196, TG 93, HDL 76, LDL 101  ~  FLP 4/17 on diet alone showed TChol 200, TG 74, HDL 87, LDL 98  HYPOTHYROIDISM (ICD-244.9) - on LEVOTHYROID 112mcg/d... ~  labs 12/09 showed TSH= 0.44 ~  labs 1/11 showed TSH= 0.66 ~  labs 1/12 showed TSH= 0.85 ~  Labs 3/13 on Levo100 showed TSH= 0.71 ~  Labs 3/14 on Levo100 showed TSH=0.42 ~  Labs 3/15 on Levo100 showed TSH= 0.43 ~  Labs 4/16 on Levo100 showing TSH= 0.89 ~  Labs 4/17 on Levo100 showed TSH=0.28, her wt is down to 116# & we decided to decr the Synthroid dose to 73mcg/d...  ESOPHAGEAL REFLUX (ICD-530.81) - she uses Cimetadine 300mg  Prn for "Stomach" & requests refill from [place]...  COLONIC POLYPS (ICD-211.3) & HEMORROIDS - Rx w/  [facility] & [facility] per GI... ~  colonoscopy 4/00 by DrDBrodie showed several 6-27mm polyps= adenomatous... ~  colonoscopy 4/05 by DrDBrodie showed only melanosis and hems... ~   colonoscopy 5/10 DrDBrodie showed mild divertics, 2 cecal polyps- tubular adenoma, f/u 101yrs. ~  colonoscopy 5/15 DrDBrodie showed divertics in sigmoid, melanosis, no recurrent polyps (she rec f/u 10 yrs).  OSTEOPENIA (ICD-733.90) - on calcium, MVI, Vit D 2000 u daily... she stopped her prev Fosamax rx on her own in 2011. ~  BMD here 8/07 showed TScores -1.2 in Spine, and -1.8 in left FemNeck. ~  labs 8/08 showed Vit D level= 38 ~  BMD here 9/09 showed TScores -0.8 in Spine, and -1.5 in left FemNeck. ~  Labs 1/11 showed Vit D level = 51 ~  Labs 3/14 showed Vit D level = 60 ~  Labs 3/15 showed Vit D level = 79 ~  BMD 3/15 showed Tscore -1.4 & rec to continue MVI, VitD, wt bearing exercises.  ANXIETY (ICD-300.00) - not currently on meds.  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 ~  She reports on-going follow up from [place] ?every 3-43mo? ~  11/13: she saw DrEnnever for follow up> on observation, doing well w/o recurrence...  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 ~  She continues to f/u w/ drennever yearly...  Health Maintenance: ~  neg [facility] at [facility]- followed yearly... due for GYN check by [facility] & she will call. ~  Immunizations:  she gets the yearly seasonal Flu vaccines... she received Shingles vaccine 2/09 at [facility]... had PNEUMOVAX in 2002 & 1/12 (age 24)... given TDAP 1/12.   Past Surgical History  Procedure Laterality Date  . Breast surgery      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 Patient does not remember exact date of surgery.  . Tonsillectomy    . Colonoscopy    . Cesarean section  1965  . Melanoma excision   12/31/2010    Procedure: MELANOMA EXCISION;  Surgeon: Zenovia Jarred, MD;  Location: [facility];  Service: General;  Laterality: Left;  wide excision melanoma left thigh, excision lesion left thigh  . Other surgical history Right     Surgical repair of architectual distortion Right Breast- Hyperplasia    Outpatient Encounter Prescriptions as of 06/12/2015  Medication Sig  . acyclovir ointment (ZOVIRAX) 5 % See admin instructions.  [name] aspirin 81 MG tablet Take 81 mg by mouth daily.    . Calcium Carbonate-Vitamin D (CALCIUM + D PO) Take by mouth daily.    . Cholecalciferol (VITAMIN D) 2000 UNITS CAPS Take 1 capsule by mouth daily.  . cimetidine (TAGAMET) 400 MG tablet Take 400 mg by mouth as needed.  . conjugated estrogens (PREMARIN) vaginal cream Use 1/2 g vaginally with each pessary cleaning.  . fluticasone (FLONASE) 50 MCG/ACT nasal spray PLACE 2 SPRAYS INTO BOTH NOSTRILS AS NEEDED.  [name] levothyroxine (SYNTHROID, LEVOTHROID) 100 MCG tablet TAKE 1 TABLET (100 MCG TOTAL) BY MOUTH DAILY.  [name] LUTEIN PO Take 1 tablet by mouth daily.  . Multiple Vitamin (MULTIVITAMIN) tablet Take 1 tablet by mouth daily.    . nicotine polacrilex (NICORETTE) 2 MG  gum Take 2 mg by mouth as needed. Not smoking  . [DISCONTINUED] levothyroxine (SYNTHROID, LEVOTHROID) 100 MCG tablet TAKE 1 TABLET (100 MCG TOTAL) BY MOUTH DAILY.   No facility-administered encounter medications on file as of 06/12/2015.    Allergies  Allergen Reactions  . Codeine Nausea And Vomiting    Current Medications, Allergies, Past Medical History, Past Surgical History, Family History, and Social History were reviewed in [employer] record.   Review of Systems         The patient complains of fatigue, dyspnea on exertion, cough, gas/bloating, incontinence, joint pain, arthritis, and anxiety.  The patient denies fever, chills, sweats, anorexia, weakness, malaise, weight loss, sleep disorder, blurring,  diplopia, eye irritation, eye discharge, vision loss, eye pain, photophobia, earache, ear discharge, tinnitus, decreased hearing, nasal congestion, nosebleeds, sore throat, hoarseness, chest pain, palpitations, syncope, orthopnea, PND, peripheral edema, dyspnea at rest, excessive sputum, hemoptysis, wheezing, pleurisy, nausea, vomiting, diarrhea, constipation, change in bowel habits, abdominal pain, melena, hematochezia, jaundice, indigestion/heartburn, dysphagia, odynophagia, dysuria, hematuria, urinary frequency, urinary hesitancy, nocturia, back pain, joint swelling, muscle cramps, muscle weakness, stiffness, sciatica, restless legs, leg pain at night, leg pain with exertion, rash, itching, dryness, suspicious lesions, paralysis, paresthesias, seizures, tremors, vertigo, transient blindness, frequent falls, frequent headaches, difficulty walking, depression, memory loss, confusion, cold intolerance, heat intolerance, polydipsia, polyphagia, polyuria, unusual weight change, abnormal bruising, bleeding, enlarged lymph nodes, urticaria, allergic rash, hay fever, and recurrent infections.     Objective:   Physical Exam     WD, WN, 75 y/o WF in NAD...  GENERAL:  Alert & oriented; pleasant & cooperative... HEENT:  [place]/AT, EOM-wnl, PERRLA, EACs-clear, TMs-wnl, NOSE-clear, THROAT-clear & wnl. NECK:  Supple w/ fairROM; no JVD; normal carotid impulses w/o bruits; no thyromegaly or nodules palpated; no lymphadenopathy. CHEST:  Clear to P & A; without wheezes/ rales/ or rhonchi. HEART:  Regular Rhythm; without murmurs/ rubs/ or gallops. ABDOMEN:  Soft & nontender; normal bowel sounds; no organomegaly or masses detected. EXT: without deformities or arthritic changes; mild varicose veins/ venous insuffic/ tr edema. Scar on inner left thigh from melanoma surg, no palp adenopathy... NEURO:  CN's intact; motor testing normal; sensory testing normal; gait normal & balance OK. DERM:  No lesions noted; no rash  etc...  RADIOLOGY DATA:  Reviewed in the EPIC EMR & discussed w/ the patient...   LABORATORY DATA:  Reviewed in the EPIC EMR & discussed w/ the patient...    Assessment:      AB, ex-smoker>  She quit smoking 6/12 on a bet w/ her uncle; & she has remain quit!  No resp exac & stable, no meds needed... ~  Asked to wean off the nicorette gum!  Hx MVP>  On ASA, she is asymptomatic w/o CP, palpit, etc...  CHOL>  FLP is much improved on diet & she will not need meds at this time...  Hypothyroid>  Prev stable on Levothy100 per day, TSH 4/17 = 0.28 & wt down to 116# so we decided to wean the dose to 63mcg/d...  GERD>  She uses OTC Tagamet as needed (her preference)...  Divertics, Colon Polyps>  She is up to date on colon screening w/ no polyps seen 5/15...  Osteopenia>  On Calcium, MVI, Vit D; she stopped her Fosamax on her own in 2011...  Anxiety>  She does not want anxiolytic therapy...  HxMELANOMA>  Bx- DrHouston ([place] level 2), wide excision- DrThompson (no residual tumor), oncology consult- DrEnnever ([place]-no adjuvant therapy)>  She is  in good hands & favorable prognosis...     Plan:     Patient's Medications  New Prescriptions   No medications on file  Previous Medications   ACYCLOVIR OINTMENT (ZOVIRAX) 5 %    See admin instructions.   ASPIRIN 81 MG TABLET    Take 81 mg by mouth daily.     CALCIUM CARBONATE-VITAMIN D (CALCIUM + D PO)    Take by mouth daily.     CHOLECALCIFEROL (VITAMIN D) 2000 UNITS CAPS    Take 1 capsule by mouth daily.   CIMETIDINE (TAGAMET) 400 MG TABLET    Take 400 mg by mouth as needed.   CONJUGATED ESTROGENS (PREMARIN) VAGINAL CREAM    Use 1/2 g vaginally with each pessary cleaning.   FLUTICASONE (FLONASE) 50 MCG/ACT NASAL SPRAY    PLACE 2 SPRAYS INTO BOTH NOSTRILS AS NEEDED.   LEVOTHYROXINE (SYNTHROID, LEVOTHROID) 100 MCG TABLET    TAKE 1 TABLET (100 MCG TOTAL) BY MOUTH DAILY.   LUTEIN PO    Take 1 tablet by mouth daily.   MULTIPLE VITAMIN  (MULTIVITAMIN) TABLET    Take 1 tablet by mouth daily.     NICOTINE POLACRILEX (NICORETTE) 2 MG GUM    Take 2 mg by mouth as needed. Not smoking  Modified Medications   No medications on file  Discontinued Medications   LEVOTHYROXINE (SYNTHROID, LEVOTHROID) 100 MCG TABLET    TAKE 1 TABLET (100 MCG TOTAL) BY MOUTH DAILY.

## 2015-06-13 ENCOUNTER — Telehealth: Payer: Self-pay | Admitting: Pulmonary Disease

## 2015-06-13 MED ORDER — LEVOTHYROXINE SODIUM 75 MCG PO TABS: 75.0000 ug | ORAL_TABLET | Freq: Every day | ORAL | Status: DC

## 2015-06-13 NOTE — Telephone Encounter (Signed)
Notes Recorded by Randa Spike, CMA on 06/13/2015 at 10:49 AM lmtcb x1 for pt. Notes Recorded by Noralee Space, MD on 06/12/2015 at 3:57 PM Please notify patient>  FLP looks good on diet alone- all parameters are at goals... TSH however is oversuppressed (ie- dose is sl too large) & I rec that we decr the SYNTHROID 135mcg dose to the 44mcg/d dose- please call in Sythroid 75 one daily Qam #30 or #90 & refill prn... -----------------------  Result Notes     Notes Recorded by Randa Spike, CMA on 06/13/2015 at 10:49 AM lmtcb x1 for pt. ------  Notes Recorded by Noralee Space, MD on 06/12/2015 at 3:53 PM Please notify patient> CXR looks OK-  norm heart size, clear lungs x sl incr markings from her yrs of smoking, NAD; some DDD in Tspine   Pt is aware of results. Rx has been sent in for the new levothyroxine dose. Nothing further was needed.

## 2015-06-18 ENCOUNTER — Ambulatory Visit (INDEPENDENT_AMBULATORY_CARE_PROVIDER_SITE_OTHER): Payer: Medicare Other | Admitting: Obstetrics and Gynecology

## 2015-06-18 ENCOUNTER — Encounter: Payer: Self-pay | Admitting: Obstetrics and Gynecology

## 2015-06-18 VITALS — BP 136/76 | HR 60 | Ht 62.0 in | Wt 116.8 lb

## 2015-06-18 DIAGNOSIS — N813 Complete uterovaginal prolapse: Secondary | ICD-10-CM

## 2015-06-18 DIAGNOSIS — Z4689 Encounter for fitting and adjustment of other specified devices: Secondary | ICD-10-CM | POA: Diagnosis not present

## 2015-06-18 NOTE — Progress Notes (Signed)
Patient ID: Ariel Holmes, female   DOB: 1940/05/02, 75 y.o.   MRN: [id number] GYNECOLOGY  VISIT   HPI: 75 y.o.   Widowed  Caucasian  female   G1P1001 with Patient's last menstrual period was 02/16/1988 (approximate).   here for pessary follow up.    No vaginal discharge, bleeding, or pain.   Some external itching, which has resolved. Used pads recently for [employer].   Traveling in one month the [place] to see the whales.  GYNECOLOGIC HISTORY: Patient's last menstrual period was 02/16/1988 (approximate). Contraception:  Postmenopausal Menopausal hormone therapy:  Premain vaginal cream only with pessary change Last mammogram: 01-03-15 3D/Density Cat.B/additional views of Lt.Breast--Neg/BiRads2/Ret.to screening:[facility].  Last pap smear:  2012 normal        OB History    Gravida Para Term Preterm AB TAB SAB Ectopic Multiple Living   _0 Patient Active Problem List   Diagnosis Date Noted  . 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 Cystocele 06/13/2013  . Ex-cigarette smoker 05/04/2013  . Asthmatic bronchitis 05/04/2013  . 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 ESOPHAGEAL REFLUX 03/24/2010  . 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 HYPERCHOLESTEROLEMIA, BORDERLINE, WITH HIGH HDL 01/18/2008  . Disorder of bone and cartilage 01/18/2008  . COLONIC POLYPS 01/17/2008  . Hypothyroidism 01/17/2008  . Anxiety state 01/17/2008  . MITRAL VALVE PROLAPSE, HX OF 01/17/2008    Past Medical History  Diagnosis Date  . MVP (mitral valve prolapse)   . Varicose vein   . Hypercholesterolemia   . Hypothyroidism   . Hx of colonic polyps   . Osteopenia   . COPD (chronic obstructive pulmonary disease) ([facility])   . Melanoma ([place])     lt upper thigh  . Melanoma of thigh ([place]) 02/15/2011  . Melanoma of thigh ([place]) 2014    Left   . Prolapsed uterus     Past Surgical History  Procedure Laterality Date  . Breast surgery      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 Patient does not  remember exact date of surgery.  . Tonsillectomy    . Colonoscopy    . Cesarean section  1965  . Melanoma excision  12/31/2010    Procedure: MELANOMA EXCISION;  Surgeon: Zenovia Jarred, MD;  Location: [facility];  Service: General;  Laterality: Left;  wide excision melanoma left thigh, excision lesion left thigh  . Other surgical history Right     Surgical repair of architectual distortion Right Breast- Hyperplasia    Current Outpatient Prescriptions  Medication Sig Dispense Refill  . acyclovir ointment (ZOVIRAX) 5 % See admin instructions.  0  . aspirin 81 MG tablet Take 81 mg by mouth daily.      . Calcium Carbonate-Vitamin D (CALCIUM + D PO) Take by mouth daily.      . Cholecalciferol (VITAMIN D) 2000 UNITS CAPS Take 1 capsule by mouth daily.    . cimetidine (TAGAMET) 400 MG tablet Take 400 mg by mouth as needed.    . conjugated estrogens (PREMARIN) vaginal cream Use 1/2 g vaginally with each pessary cleaning. 30 g 1  . fluticasone (FLONASE) 50 MCG/ACT nasal spray PLACE 2 SPRAYS INTO BOTH NOSTRILS AS NEEDED. 16 g 1  . levothyroxine (SYNTHROID) 75 MCG tablet Take 1 tablet (75 mcg total) by mouth daily before breakfast. 30 tablet 11  . LUTEIN PO Take 1 tablet by mouth daily.    [name]  Multiple Vitamin (MULTIVITAMIN) tablet Take 1 tablet by mouth daily.      . nicotine polacrilex (NICORETTE) 2 MG gum Take 2 mg by mouth as needed. Not smoking     No current facility-administered medications for this visit.     ALLERGIES: Codeine  Family History  Problem Relation Age of Onset  . Diabetes Paternal Grandmother   . Diabetes Paternal Grandfather   . Hypertension Mother   . Congestive Heart Failure Mother   . Thyroid disease Mother   . Hypertension Maternal Grandfather   . CVA Maternal Grandfather   . Stroke Father   . Heart attack Father 59  . Esophageal cancer Neg Hx   . Rectal cancer Neg Hx   . Stomach cancer Neg Hx   . Colon cancer Neg Hx     Social History    Social History  . Marital Status: Widowed    Spouse Name: fred Volcy  . Number of Children: N/A  . Years of Education: N/A   Occupational History  . Not on file.   Social History Main Topics  . Smoking status: Former Smoker -- 1.00 packs/day for 50 years    Types: Cigarettes    Start date: 03/08/1960    Quit date: 07/23/2010  . Smokeless tobacco: Never Used     Comment: quit smoking 3 years ago  . Alcohol Use: No  . Drug Use: No  . Sexual Activity: No   Other Topics Concern  . Not on file   Social History Narrative    ROS:  Pertinent items are noted in HPI.  PHYSICAL EXAMINATION:    BP 136/76 mmHg  Pulse 60  Ht _0  (1.575 m)  Wt 116 lb 12.8 oz (52.98 kg)  BMI 21.36 kg/m2  LMP 02/16/1988 (Approximate)    General appearance: alert, cooperative and appears stated age    Pelvic: External genitalia:  no lesions              Urethra:  normal appearing urethra with no masses, tenderness or lesions              Bartholins and Skenes: normal                 Vagina: normal appearing vagina with normal color and discharge, no lesions.  Minimal pink change on the posterior vaginal wall.              Cervix: no lesions   Bimanual Exam:  Uterus:  normal size, contour, position, consistency, mobility, non-tender              Adnexa: normal adnexa and no mass, fullness, tenderness               Pessary ring with support removed, cleansed, and replaced with Premarin cream 1 gm.  Chaperone was present for exam.  ASSESSMENT  Complete uterovaginal prolapse. Vaginal inflammation essentially resolved with Premarin vaginal cream.  Hx of breast atypical hyperplasia.   PLAN  Continue pessary use.  Use Premarin cream pea size amount to the hymen twice weekly to reduce discomfort with pessary removal for cleaning.  Return in 6 weeks.    An After Visit Summary was printed and given to the patient.  _15_____ minutes face to face time of which over 50% was spent in  counseling.

## 2015-06-19 ENCOUNTER — Other Ambulatory Visit: Payer: Self-pay | Admitting: Internal Medicine

## 2015-06-23 ENCOUNTER — Telehealth: Payer: Self-pay | Admitting: Pulmonary Disease

## 2015-06-23 NOTE — Telephone Encounter (Signed)
Patient states that she was put on 49mcg of Synthroid.  Patient states that on this dose, she has been very sleepy, fatigued.  She said that her energy level has decreased.  She said that she is a very hyper person and she is feeling like she has to push herself to get up in the mornings, she said that usually she will walk 5 miles and she has to push herself to get 3 miles.  Patient wants to know if the blood test was accurate.  She would like to have another blood test drawn.  Patient says that she is leaving Wednesday morning 06/25/15 to go across country, she will be gone until the end of the month.  Wants to know what she needs to do.    Dr. Lenna Gilford, please advise.  Current Outpatient Prescriptions on File Prior to Visit  Medication Sig Dispense Refill  . acyclovir ointment (ZOVIRAX) 5 % See admin instructions.  0  . aspirin 81 MG tablet Take 81 mg by mouth daily.      . Calcium Carbonate-Vitamin D (CALCIUM + D PO) Take by mouth daily.      . Cholecalciferol (VITAMIN D) 2000 UNITS CAPS Take 1 capsule by mouth daily.    . cimetidine (TAGAMET) 400 MG tablet Take 400 mg by mouth as needed.    . conjugated estrogens (PREMARIN) vaginal cream Use 1/2 g vaginally with each pessary cleaning. 30 g 1  . fluticasone (FLONASE) 50 MCG/ACT nasal spray PLACE 2 SPRAYS INTO BOTH NOSTRILS AS NEEDED. 16 g 1  . levothyroxine (SYNTHROID) 75 MCG tablet Take 1 tablet (75 mcg total) by mouth daily before breakfast. 30 tablet 11  . LUTEIN PO Take 1 tablet by mouth daily.    . Multiple Vitamin (MULTIVITAMIN) tablet Take 1 tablet by mouth daily.      . nicotine polacrilex (NICORETTE) 2 MG gum Take 2 mg by mouth as needed. Not smoking     No current facility-administered medications on file prior to visit.   Allergies  Allergen Reactions  . Codeine Nausea And Vomiting

## 2015-06-23 NOTE — Telephone Encounter (Signed)
SN patient

## 2015-06-24 NOTE — Telephone Encounter (Signed)
Per Dr. Lenna Gilford:  Way too soon to have any change in symptoms for dose adjustment.  Recommends to go back to her 171mcg dose while out of town, but needs to call back when she returns to [place] to have repeat thyroid blood tests.  ---------------------------------------  Patient notified of Dr. Jeannine Kitten recommendations. Nothing further needed.

## 2015-07-10 ENCOUNTER — Telehealth: Payer: Self-pay | Admitting: Pulmonary Disease

## 2015-07-10 MED ORDER — AZITHROMYCIN 250 MG PO TABS: 250.0000 mg | ORAL_TABLET | ORAL | Status: DC

## 2015-07-10 NOTE — Telephone Encounter (Signed)
Spoke with the pt  She c/o feeling bad in general x 4 days  Had low grade temp of 99 last night  She is having some bloody nasal d/c and sore throat  Not coughing much and denies any SOB or wheeze  Please advise thanks!  Allergies  Allergen Reactions  . Codeine Nausea And Vomiting   Current Outpatient Prescriptions on File Prior to Visit  Medication Sig Dispense Refill  . acyclovir ointment (ZOVIRAX) 5 % See admin instructions.  0  . aspirin 81 MG tablet Take 81 mg by mouth daily.      . Calcium Carbonate-Vitamin D (CALCIUM + D PO) Take by mouth daily.      . Cholecalciferol (VITAMIN D) 2000 UNITS CAPS Take 1 capsule by mouth daily.    . cimetidine (TAGAMET) 400 MG tablet Take 400 mg by mouth as needed.    . conjugated estrogens (PREMARIN) vaginal cream Use 1/2 g vaginally with each pessary cleaning. 30 g 1  . fluticasone (FLONASE) 50 MCG/ACT nasal spray PLACE 2 SPRAYS INTO BOTH NOSTRILS AS NEEDED. 16 g 5  . levothyroxine (SYNTHROID) 75 MCG tablet Take 1 tablet (75 mcg total) by mouth daily before breakfast. 30 tablet 11  . LUTEIN PO Take 1 tablet by mouth daily.    . Multiple Vitamin (MULTIVITAMIN) tablet Take 1 tablet by mouth daily.      . nicotine polacrilex (NICORETTE) 2 MG gum Take 2 mg by mouth as needed. Not smoking     No current facility-administered medications on file prior to visit.

## 2015-07-10 NOTE — Telephone Encounter (Signed)
Spoke with the pt and notified of recs per CDY  She verbalized understanding and nothing further needed  Rx was sent to pharm  

## 2015-07-10 NOTE — Telephone Encounter (Signed)
Offer Z pak Advise stay well hydrated Mucinex-DM may help

## 2015-07-11 ENCOUNTER — Ambulatory Visit: Payer: Self-pay | Admitting: Obstetrics & Gynecology

## 2015-07-28 ENCOUNTER — Telehealth: Payer: Self-pay | Admitting: Pulmonary Disease

## 2015-07-28 MED ORDER — METHYLPREDNISOLONE 4 MG PO TBPK: ORAL_TABLET | ORAL | Status: DC

## 2015-07-28 NOTE — Telephone Encounter (Signed)
Per SN: try prednisone 5mg  6-day pack.  Thanks.  Called spoke with patient who reported that she prefers to not take prednisone because previously she experienced a lot if jitteriness and lack of sleep on a prednisone taper.  Asked pt if she recalls the time of day that she took the prednisone as it is typically recommended to be taken in the AM for that reason - pt does not recall.  Advised pt that due to her previous sensitivity, she may do better on the medrol and to take it in the mornings.  Pt okay with trying this.  Pt is now asking if SN would authorize her having a Beta-Strep swab as her throat "actually hurts".  Advised will ask SN but may be tomorrow before we are able to call her back - pt okay with this.  If okay, she would also like an Rx for MMW.  SN please advise, thank you.

## 2015-07-28 NOTE — Telephone Encounter (Signed)
PER SN: Yes okay to have Beta Strep test in the morning; after results of Beta Strep then okay to call in MMW Rx, If she does not want to take Medrol then she needs ROV with SN on Friday at 10 am. Thanks.

## 2015-07-28 NOTE — Addendum Note (Signed)
Addended by: Parke Poisson E on: 07/28/2015 05:12 PM   Modules accepted: Orders

## 2015-07-28 NOTE — Telephone Encounter (Signed)
Spoke with pt. She states that she take a Medrol Dose Pak. Wants something else.  SN - please advise. Thanks.

## 2015-07-28 NOTE — Telephone Encounter (Signed)
Per SN: call in medrol dose pak #1.  If not improved then she will need ROV next week.  Thanks.

## 2015-07-28 NOTE — Telephone Encounter (Signed)
Spoke with pt. States that she is not feeling any better since having the flu. Reports cough, fatigue, PND, chest discomfort, body aches. Denies chest tightness, wheezing, SOB or fever. States, "I feel yucky." Symptoms started over 1 month ago. Would like SN's recommendations.  Allergies  Allergen Reactions  . Codeine Nausea And Vomiting    SN - please advise. Thanks.

## 2015-07-28 NOTE — Telephone Encounter (Signed)
Pt aware of recs per SN  Appt was scheduled for Friday at 10 am

## 2015-07-30 ENCOUNTER — Encounter: Payer: Self-pay | Admitting: Obstetrics and Gynecology

## 2015-07-30 ENCOUNTER — Ambulatory Visit (INDEPENDENT_AMBULATORY_CARE_PROVIDER_SITE_OTHER): Payer: Medicare Other | Admitting: Obstetrics and Gynecology

## 2015-07-30 VITALS — BP 132/70 | HR 64 | Resp 16 | Ht 62.0 in | Wt 118.0 lb

## 2015-07-30 DIAGNOSIS — N813 Complete uterovaginal prolapse: Secondary | ICD-10-CM | POA: Diagnosis not present

## 2015-07-30 DIAGNOSIS — N765 Ulceration of vagina: Secondary | ICD-10-CM

## 2015-07-30 NOTE — Progress Notes (Signed)
Patient ID: Ariel Holmes, female   DOB: 09-Jul-1940, 75 y.o.   MRN: [id number] GYNECOLOGY  VISIT   HPI: 75 y.o.   Widowed  Caucasian  female   G1P1001 with Patient's last menstrual period was 02/16/1988 (approximate).   here for pessary check.   No pain, bleeding or abnormal discharge.   Returned from trip with respiratory infection.  Took a Z pack.  Still had congestion.   GYNECOLOGIC HISTORY: Patient's last menstrual period was 02/16/1988 (approximate). Contraception:  Postmenopausal Menopausal hormone therapy:  Premarin vaginal cream Last mammogram:  01-03-15 3D/Density Cat.B/additional views of Lt.Breast--Neg/BiRads2/Ret.to screening:[facility]. Last pap smear:   2012 normal        OB History    Gravida Para Term Preterm AB TAB SAB Ectopic Multiple Living   _0 Patient Active Problem List   Diagnosis Date Noted  . 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 Cystocele 06/13/2013  . Ex-cigarette smoker 05/04/2013  . Asthmatic bronchitis 05/04/2013  . 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 ESOPHAGEAL REFLUX 03/24/2010  . 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 HYPERCHOLESTEROLEMIA, BORDERLINE, WITH HIGH HDL 01/18/2008  . Disorder of bone and cartilage 01/18/2008  . COLONIC POLYPS 01/17/2008  . Hypothyroidism 01/17/2008  . Anxiety state 01/17/2008  . MITRAL VALVE PROLAPSE, HX OF 01/17/2008    Past Medical History  Diagnosis Date  . MVP (mitral valve prolapse)   . Varicose vein   . Hypercholesterolemia   . Hypothyroidism   . Hx of colonic polyps   . Osteopenia   . COPD (chronic obstructive pulmonary disease) ([facility])   . Melanoma ([facility])     lt upper thigh  . 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 Melanoma of thigh ([facility]) 2014    Left   . Prolapsed uterus     Past Surgical History  Procedure Laterality Date  . Breast surgery      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 Patient does not remember exact date of surgery.  . Tonsillectomy    . Colonoscopy    . Cesarean section   1965  . Melanoma excision  12/31/2010    Procedure: MELANOMA EXCISION;  Surgeon: Zenovia Jarred, MD;  Location: [facility];  Service: General;  Laterality: Left;  wide excision melanoma left thigh, excision lesion left thigh  . Other surgical history Right     Surgical repair of architectual distortion Right Breast- Hyperplasia    Current Outpatient Prescriptions  Medication Sig Dispense Refill  . acyclovir ointment (ZOVIRAX) 5 % See admin instructions.  0  . aspirin 81 MG tablet Take 81 mg by mouth daily.      [name] azithromycin (ZITHROMAX) 250 MG tablet Take 1 tablet (250 mg total) by mouth as directed. 6 tablet 0  . Calcium Carbonate-Vitamin D (CALCIUM + D PO) Take by mouth daily.      . Cholecalciferol (VITAMIN D) 2000 UNITS CAPS Take 1 capsule by mouth daily.    . cimetidine (TAGAMET) 400 MG tablet Take 400 mg by mouth as needed.    . conjugated estrogens (PREMARIN) vaginal cream Use 1/2 g vaginally with each pessary cleaning. 30 g 1  . fluticasone (FLONASE) 50 MCG/ACT nasal spray PLACE 2 SPRAYS INTO BOTH NOSTRILS AS NEEDED. 16 g 5  . levothyroxine (SYNTHROID) 75 MCG tablet Take 1 tablet (75 mcg total) by mouth daily before breakfast. 30 tablet 11  . LUTEIN PO Take 1  tablet by mouth daily.    . Multiple Vitamin (MULTIVITAMIN) tablet Take 1 tablet by mouth daily.      . nicotine polacrilex (NICORETTE) 2 MG gum Take 2 mg by mouth as needed. Not smoking    . methylPREDNISolone (MEDROL DOSEPAK) 4 MG TBPK tablet follow package directions (Patient not taking: Reported on 07/30/2015) 21 tablet 0   No current facility-administered medications for this visit.     ALLERGIES: Codeine  Family History  Problem Relation Age of Onset  . Diabetes Paternal Grandmother   . Diabetes Paternal Grandfather   . Hypertension Mother   . Congestive Heart Failure Mother   . Thyroid disease Mother   . Hypertension Maternal Grandfather   . CVA Maternal Grandfather   . Stroke Father   .  Heart attack Father 74  . Esophageal cancer Neg Hx   . Rectal cancer Neg Hx   . Stomach cancer Neg Hx   . Colon cancer Neg Hx     Social History   Social History  . Marital Status: Widowed    Spouse Name: fred Hayton  . Number of Children: N/A  . Years of Education: N/A   Occupational History  . Not on file.   Social History Main Topics  . Smoking status: Former Smoker -- 1.00 packs/day for 50 years    Types: Cigarettes    Start date: 03/08/1960    Quit date: 07/23/2010  . Smokeless tobacco: Never Used     Comment: quit smoking 3 years ago  . Alcohol Use: No  . Drug Use: No  . Sexual Activity: No   Other Topics Concern  . Not on file   Social History Narrative    ROS:  Pertinent items are noted in HPI.  PHYSICAL EXAMINATION:    BP 132/70 mmHg  Pulse 64  Resp 16  Ht 5' 2" (1.575 m)  Wt 118 lb (53.524 kg)  BMI 21.58 kg/m2  LMP 02/16/1988 (Approximate)    General appearance: alert, cooperative and appears stated age    Pelvic: External genitalia:  no lesions              Urethra:  normal appearing urethra with no masses, tenderness or lesions              Bartholins and Skenes: normal                 Vagina:  Posterior vaginal wall with erythema and shallow ulceration.  Area involved is about 3 cm.               Cervix: no lesions               Bimanual Exam:  Uterus:  normal size, contour, position, consistency, mobility, non-tender              Adnexa: no mass, fullness, tenderness   Premarin 1.5 grams placed in the vagina.                Chaperone was present for exam.  ASSESSMENT  Complete uterovaginal prolapse.  Vaginal ulceration from pessary use.   PLAN  Discussion of ulceration.  Keep pessary out for 2 weeks.  Place Premarin 1 gram per vagina three times per week for 2 weeks.  Return in 2 weeks for reassessment and replacement of pessary.    An After Visit Summary was printed and given to the patient.  _15_____ minutes face to face time  of which over 50% was spent  in counseling.

## 2015-08-01 ENCOUNTER — Encounter: Payer: Self-pay | Admitting: Pulmonary Disease

## 2015-08-01 ENCOUNTER — Ambulatory Visit (INDEPENDENT_AMBULATORY_CARE_PROVIDER_SITE_OTHER): Payer: Medicare Other | Admitting: Pulmonary Disease

## 2015-08-01 ENCOUNTER — Other Ambulatory Visit (INDEPENDENT_AMBULATORY_CARE_PROVIDER_SITE_OTHER): Payer: Medicare Other

## 2015-08-01 VITALS — BP 108/60 | HR 68 | Temp 98.1°F | Ht 62.0 in | Wt 118.0 lb

## 2015-08-01 DIAGNOSIS — J4520 Mild intermittent asthma, uncomplicated: Secondary | ICD-10-CM | POA: Diagnosis not present

## 2015-08-01 DIAGNOSIS — I868 Varicose veins of other specified sites: Secondary | ICD-10-CM

## 2015-08-01 DIAGNOSIS — I8390 Asymptomatic varicose veins of unspecified lower extremity: Secondary | ICD-10-CM

## 2015-08-01 DIAGNOSIS — R0989 Other specified symptoms and signs involving the circulatory and respiratory systems: Secondary | ICD-10-CM | POA: Insufficient documentation

## 2015-08-01 DIAGNOSIS — E039 Hypothyroidism, unspecified: Secondary | ICD-10-CM

## 2015-08-01 DIAGNOSIS — B349 Viral infection, unspecified: Secondary | ICD-10-CM

## 2015-08-01 LAB — CBC WITH DIFFERENTIAL/PLATELET
BASOS ABS: 0.1 10*3/uL (ref 0.0–0.1)
Basophils Relative: 1 % (ref 0.0–3.0)
EOS ABS: 0.1 10*3/uL (ref 0.0–0.7)
Eosinophils Relative: 0.7 % (ref 0.0–5.0)
HEMATOCRIT: 38.9 % (ref 36.0–46.0)
HEMOGLOBIN: 12.6 g/dL (ref 12.0–15.0)
LYMPHS PCT: 47 % — AB (ref 12.0–46.0)
Lymphs Abs: 5 10*3/uL — ABNORMAL HIGH (ref 0.7–4.0)
MCHC: 32.4 g/dL (ref 30.0–36.0)
MCV: 94.3 fl (ref 78.0–100.0)
MONO ABS: 1 10*3/uL (ref 0.1–1.0)
Monocytes Relative: 9 % (ref 3.0–12.0)
Neutro Abs: 4.5 10*3/uL (ref 1.4–7.7)
Neutrophils Relative %: 42.3 % — ABNORMAL LOW (ref 43.0–77.0)
PLATELETS: 211 10*3/uL (ref 150.0–400.0)
RBC: 4.13 Mil/uL (ref 3.87–5.11)
RDW: 16.1 % — ABNORMAL HIGH (ref 11.5–15.5)
WBC: 10.7 10*3/uL — AB (ref 4.0–10.5)

## 2015-08-01 LAB — COMPREHENSIVE METABOLIC PANEL
ALBUMIN: 4.4 g/dL (ref 3.5–5.2)
ALK PHOS: 63 U/L (ref 39–117)
ALT: 13 U/L (ref 0–35)
AST: 19 U/L (ref 0–37)
BILIRUBIN TOTAL: 0.4 mg/dL (ref 0.2–1.2)
BUN: 15 mg/dL (ref 6–23)
CALCIUM: 10 mg/dL (ref 8.4–10.5)
CO2: 29 mEq/L (ref 19–32)
CREATININE: 0.66 mg/dL (ref 0.40–1.20)
Chloride: 104 mEq/L (ref 96–112)
GFR: 92.83 mL/min (ref >60.00)
Glucose, Bld: 92 mg/dL (ref 70–99)
Potassium: 5.4 mEq/L — ABNORMAL HIGH (ref 3.5–5.1)
Sodium: 140 mEq/L (ref 135–145)
TOTAL PROTEIN: 7.5 g/dL (ref 6.0–8.3)

## 2015-08-01 LAB — TSH: TSH: 2.81 u[IU]/mL (ref 0.35–4.50)

## 2015-08-01 LAB — SEDIMENTATION RATE: SED RATE: 8 mm/h (ref 0–30)

## 2015-08-01 MED ORDER — METHYLPREDNISOLONE ACETATE 80 MG/ML IJ SUSP
80.0000 mg | Freq: Once | INTRAMUSCULAR | Status: AC
  Administered 2015-08-01: 80 mg via INTRAMUSCULAR

## 2015-08-01 NOTE — Progress Notes (Addendum)
Subjective:     Patient ID: Ariel Holmes, female   DOB: 09-14-1940, 75 y.o.   MRN: [id number]  HPI 75 y/o WF here for a follow up visit... she has multiple medical problems as noted below...   ~  SEE PREV EPIC NOTES FOR OLDER DATA >>    CXR 3/13 showed heart is wnl, lungs clear x sl incr markings, NAD.[name]  LABS 3/13:  FLP- Improved on diet;  Chems- wnl;  CBC- wnl;  TSH=0.71 on Levothy100/d...  LABS 11/13:  Chems- wnl;  CBC- wnl...  CXR 3/14 showed normal heart size, calcif & ectasia of Ao, clear lungs, DJD/ sl scoliosis of spine/ osteopenia...  LABS 3/14:  FLP- not at goals on diet alone but hi HDL;  TSH=0.42;  VitD=60   CXR 3/15 showed norm heart size, clear lungs, mild DJD spine, no lesions/ NAD...  PFT 3/15 showed FVC=2.00 (76%), FEV1=1.56 (77%), %1sec=78%, mid-flows=75% predicted... Interpret: No signif obstruction, can't r/o mild restriction w/o lung vol measurement- SMN  BMD 3/15 showed TScore -0.5 in Spine and -1.4 in [facility]; Rec to continue Calcium, MVI, VitD & exercise...  ~  June 11, 2014:  66mo ROV & Ariel Holmes has had a rough time since [name] passed away Mar 12, 2014; she works Scientist, research (physical sciences) for [employer] & that has helped keeping her busy,,, she is c/o some nasal congestion, drainage, & blowing beige mucus; draining to her chest but denies hemooptysis, SOB, CP, f/c/s; she is fatigued and achey- using Flonase and Saline... She quit smoking 4 yrs ago & still chews nicorette gum- we discussed weaning this down already! We reviewed the following medical problems during today's office visit >>     AR, AB, ex-smoker> on OTC Antihist, Flonase, & Nicorette; she quit smoking 6/12 but still using Nicotine replacement & we discussed weaning this "medication"...    MVP> on ASA81; she denies CP, palpit, SOB, edema, etc; she is active & doing satis...    Ven Insuffic> she knows to avoid sodium, elev legs, wear support hose...    Chol> on diet alone esp w/ her good HDL; Labs 4/16  showed TChol 196, TG 93, HDL 76, LDL 101    Hypothy> on Synthroid100; Labs 4/16 showed TSH= 0.89; she remains clinically & biochem euthyroid...    GI- GERD, Polyps, Hems> stable w/o abd pain, dysphagia, n/v, c/d, blood seen; last colon 5/15 by DrDBrodie showed divertics in sigmoid, melanosis, no recurrent polyps (she rec f/u 10 yrs).    Osteopenia> on Calcium, MVI, VitD2000; she stopped prev Fosamax Rx yrs ago- last BMD 2009 w/ Tscore -1.5 in [facility]...    Anxiety> husb [name] passed away March 12, 2014 after a long illness, she is doing satis & declines anxiolytic meds...    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 We reviewed prob list, meds, xrays and labs> see below for updates >>   CXR 05/2014 showed norm heart size, clear lungs, NAD...   LABS 4/16:  FLP- at goals on diet;  TSH=.89;  Prev Chems/ CBC 01/2014 were wnl...   ~  June 12, 2015:  10yr ROV & [name] has had some interval GYN problems w/ prolapsed uterus "my insides fell out" & currently managed w/ a pessary- changed every 18mo & she is holding off on surg consideration (she did see Urology for PT, exercises);  She quit smoking 5 yrs ago & I note that she is still using nicorette  gum daily!  She notes that her breathing is OK, no cough, sputum, hemoptysis, and denies SOB, states she's walking 3-4 mi per day w/ friends & denies problems;  She had some neck/ shoulder pain & managed this w/ massage therapy & accupuncture/ "cupping" which really helped she says...    AR, AB, ex-smoker> on OTC Antihist, Flonase, & Nicorette; she quit smoking 6/12 but still using Nicotine replacement & we discussed weaning this "medication"; breathing is good, exercising daily.    MVP> on ASA81; she denies CP, palpit, SOB, edema, etc; she is active & doing satis...    Ven Insuffic> she knows to avoid sodium, elev legs, wear support hose...    Chol> on diet alone esp w/  her good HDL; Labs 4/17 showed TChol 200, TG 74, HDL 87, LDL 98    Hypothy> on Synthroid100; Labs 4/17 showed TSH= 0.28; she remains clinically stable but wt is down to 116# so we decided to decr the Synthroid to 48mcg/d.    GI- GERD, Polyps, Hems> stable w/o abd pain, dysphagia, n/v, c/d, blood seen; last colon 5/15 by DrDBrodie showed divertics in sigmoid, melanosis, no recurrent polyps (she rec f/u 10 yrs).    GYN> followed by DrSilva w/ uterine proalpse, she has a pessary (cleaned & inspected Q71mo) and she as seen Urology for PT & kaegel exercises...    Osteopenia> on Calcium, MVI, VitD2000; she stopped prev Fosamax Rx yrs ago- last BMD 2009 w/ Tscore -1.5 in [facility]...    Anxiety> husb [name] passed away 2014-03-21 after a long illness, she is doing satis & declines anxiolytic meds...    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 EXAM shows Afeb, VSS, O2sat=97% on RA;  HEENT- neg, mallampati1;  Chest- clear w/o w/r/r;  Heart- RR w/o m/r/g;  Abd- soft, nontender, neg;  Ext- w/o c/c/e;  Neuro- intact...  CXR 06/12/15>  Norm heart size,clear lungs w/ sl incr markings, NAD, DDD in Tspine...  LABS 01/2015 by DrEnnever>  Chems- wnl;  CBC- ok w/ Hg=12.1;  Vit D=61...  LABS 06/12/15>  FLP- all parameters at goals on diet alone;  TSH= 0.28 on Synthroid100 IMP/PLAN>>  Ariel Holmes is stable overall, has quit smoking 5 yrs ago but still using nicorette daily- again asked to wean off;  Her TSH is sl oversuppressed on Synthroid100 & wt is down to 116# so we will decr dose to 4mcg/d & plan recheck TFTs;  She was given PREVNAR-13 today;  We plan ROV in 32yr, sooner if needed prn...  ~  August 01, 2015:  6wk ROV & Ariel Holmes tells me that she went to [place]/ [facility] in May & returned 5/23 c/o "feeling yukey", low grade temp 99+, cough/ sinus drainage/ blowing yellow blood-streaked sput/ sore throat;  ZPak called in for pt & she  improved some but "this sounds crazy" but every afternoon the symptoms felt like they were coming back; we recommended a Medrol dosepak but pt refused this med- took OTC Mucinex, Flonase, Tylenol and called several times just still feeling bad, no energy, etc=> we brought her in for recheck...  We reviewed the problem list above.    Recall that last OV her TSH=0.28 on Synthroid100 & we cut her back to 36mcg/d but she has felt worse on this dose=> we will recheck labs today... EXAM shows Afeb, VSS, O2sat=100% on RA;  HEENT- neg, mallampati1;  Neck- soft right carotid bruit;  Chest-  clear w/o w/r/r;  Heart- RR w/o m/r/g;  Abd- soft, nontender, neg;  Ext- w/o c/c/e;  Neuro- intact...  Labs 08/01/15>  Chems- wnl;  CBC- essent wnl;  TSH=2.81 on the Synthroid75- continue same;  Sed=8 therefore no signif inflamm...  Carotid Doppler>  Heterogeneous plaque bilat & 1-39% bilat ICAstenoses (on the high end in the left carotid);  Rec- take ASA81mg /d and repeat CDoppler 51yr... IMP/PLAN>>  Jan appears to have a post-viral syndrome & in the last few days she started feeling sl better, we discussed Depo80/ rest/ fluids/ continued OTC rx w/ Mucinex, Flonase, Tylenol, etc;  Her exam is neg x for a soft right CBruit heard today=> check CDoppler;  She had ?prob (feeling worse) within a few days of decreasing her Synthroid from 139mcg to 52mcg in April & we rechecked labs today...          PROBLEM LIST:    ALLERGIC RHINITIS - uses FLONASE Prn...  ASTHMATIC BRONCHITIS, ACUTE (ICD-466.0) - no recent bronchitic exac... not on regular meds. ~  CXR 12/09 showed hyperinflation & incr markings, spondylosis in TSpine... 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 ~  CXR 3/13 showed heart size is wnl, lungs clear x sl incr markings, NAD. ~  CXR 3/14 showed normal heart size, calcif & ectasia of Ao, clear lungs, DJD/ sl scoliosis of spine/ osteopenia. ~  CXR 3/15 showed norm heart size, clear lungs, mild DJD spine, no  lesions/ NAD. ~  PTF 3/15 showed FVC=2.00 (76%), FEV1=1.56 (77%), %1sec=78%, mid-flows=75% predicted... Interpret: No signif obstruction, can't r/o mild restriction w/o lung vol measurement ~  CXR 4/16 showed norm heart size, clear lungs, NAD. 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 ~  Still using nicorette gum daily 7 asked to wean down...  MITRAL VALVE PROLAPSE, HX OF (ICD-V12.50) - on ASA 81mg /d... hx mild MVP on 2DEcho in 1995 (no signif MR)... asymptomatic without CP or palpit... 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 she knows to avoid sodium, elevate legs, wear support hose, etc...  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 Keep up the good work. ~  [place] 3/14 on diet alone showed TChol 228, TG 92, HDL 88, LDL 109 ~  FLP 3/15 on diet alone showed TChol 209, TG 78, HDL 83, LDL 105  ~  FLP 4/16 on diet alone showed TChol 196, TG 93, HDL 76, LDL 101  ~  FLP 4/17 on diet alone showed TChol 200, TG 74, HDL 87, LDL 98  HYPOTHYROIDISM (ICD-244.9) - on LEVOTHYROID 151mcg/d... ~  labs 12/09 showed TSH= 0.44 ~  labs 1/11 showed TSH= 0.66 ~  labs 1/12 showed TSH= 0.85 ~  Labs 3/13 on Levo100 showed TSH= 0.71 ~  Labs 3/14 on Levo100 showed TSH=0.42 ~  Labs 3/15 on Levo100 showed TSH= 0.43 ~  Labs 4/16 on Levo100 showing TSH= 0.89 ~  Labs 4/17 on Levo100 showed TSH=0.28, her wt is down to 116# & we decided to decr the Synthroid dose  to 31mcg/d...  ESOPHAGEAL REFLUX (ICD-530.81) - she uses Cimetadine 300mg  Prn for "Stomach" & requests refill from [place]...  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 ~  colonoscopy 4/00 by DrDBrodie showed several 6-21mm polyps= adenomatous... ~  colonoscopy 4/05 by DrDBrodie showed only melanosis and hems... ~  colonoscopy 5/10 DrDBrodie showed mild divertics, 2 cecal polyps- tubular adenoma, f/u 31yrs. ~  colonoscopy 5/15 DrDBrodie showed divertics in sigmoid, melanosis, no recurrent polyps (she rec f/u 10 yrs).  OSTEOPENIA (ICD-733.90) - on calcium, MVI, Vit D 2000 u daily... she stopped her prev Fosamax rx on her own in 2011. ~  BMD here 8/07 showed TScores -1.2 in Spine, and -1.8 in left FemNeck. ~  labs 8/08 showed Vit D level= 38 ~  BMD here 9/09 showed TScores -0.8 in Spine, and -1.5 in left FemNeck. ~  Labs 1/11 showed Vit D level = 51 ~  Labs 3/14 showed Vit D level = 60 ~  Labs 3/15 showed Vit D level = 79 ~  BMD 3/15 showed Tscore -1.4 & rec to continue MVI, VitD, wt bearing exercises.  ANXIETY (ICD-300.00) - not currently on meds.  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 ~  She reports on-going follow up from [place] ?every 3-28mo? ~  11/13: she saw DrEnnever for follow up> on observation, doing well w/o recurrence...  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 ~  She continues to f/u w/ drennever yearly...  Health Maintenance: ~  neg Mamogram at [facility]- followed yearly... due for GYN check by [facility] & she will call. ~  Immunizations:  she gets the yearly seasonal Flu vaccines... she received  Shingles vaccine 2/09 at [facility]... had PNEUMOVAX in 2002 & 1/12 (age 71)... given TDAP 1/12.   Past Surgical History  Procedure Laterality Date  . Breast surgery      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 Patient does not remember exact date of surgery.  . Tonsillectomy    . Colonoscopy    . Cesarean section  1965  . Melanoma excision  12/31/2010    Procedure: MELANOMA EXCISION;  Surgeon: Zenovia Jarred, MD;  Location: [facility];  Service: General;  Laterality: Left;  wide excision melanoma left thigh, excision lesion left thigh  . Other surgical history Right     Surgical repair of architectual distortion Right Breast- Hyperplasia    Outpatient Encounter Prescriptions as of 08/01/2015  Medication Sig  . acyclovir ointment (ZOVIRAX) 5 % See admin instructions.  [name] aspirin 81 MG tablet Take 81 mg by mouth daily.    . Calcium Carbonate-Vitamin D (CALCIUM + D PO) Take by mouth daily.    . Cholecalciferol (VITAMIN D) 2000 UNITS CAPS Take 1 capsule by mouth daily.  . cimetidine (TAGAMET) 400 MG tablet Take 400 mg by mouth as needed.  . conjugated estrogens (PREMARIN) vaginal cream Use 1/2 g vaginally with each pessary cleaning.  . fluticasone (FLONASE) 50 MCG/ACT nasal  spray PLACE 2 SPRAYS INTO BOTH NOSTRILS AS NEEDED.  [name] levothyroxine (SYNTHROID) 75 MCG tablet Take 1 tablet (75 mcg total) by mouth daily before breakfast.  . LUTEIN PO Take 1 tablet by mouth daily.  . Multiple Vitamin (MULTIVITAMIN) tablet Take 1 tablet by mouth daily.    . nicotine polacrilex (NICORETTE) 2 MG gum Take 2 mg by mouth as needed. Not smoking  . methylPREDNISolone (MEDROL DOSEPAK) 4 MG TBPK tablet follow package directions (Patient not taking: Reported on 08/01/2015)  . [DISCONTINUED] azithromycin (ZITHROMAX) 250 MG tablet Take 1 tablet (250 mg total) by mouth as directed. (Patient not taking: Reported on 08/01/2015)   No facility-administered encounter medications on file as of 08/01/2015.     Allergies  Allergen Reactions  . Codeine Nausea And Vomiting    Current Medications, Allergies, Past Medical History, Past Surgical History, Family History, and Social History were reviewed in [employer] record.   Review of Systems         The patient complains of fatigue, dyspnea on exertion, cough, gas/bloating, incontinence, joint pain, arthritis, and anxiety.  The patient denies fever, chills, sweats, anorexia, weakness, malaise, weight loss, sleep disorder, blurring, diplopia, eye irritation, eye discharge, vision loss, eye pain, photophobia, earache, ear discharge, tinnitus, decreased hearing, nasal congestion, nosebleeds, sore throat, hoarseness, chest pain, palpitations, syncope, orthopnea, PND, peripheral edema, dyspnea at rest, excessive sputum, hemoptysis, wheezing, pleurisy, nausea, vomiting, diarrhea, constipation, change in bowel habits, abdominal pain, melena, hematochezia, jaundice, indigestion/heartburn, dysphagia, odynophagia, dysuria, hematuria, urinary frequency, urinary hesitancy, nocturia, back pain, joint swelling, muscle cramps, muscle weakness, stiffness, sciatica, restless legs, leg pain at night, leg pain with exertion, rash, itching, dryness, suspicious lesions, paralysis, paresthesias, seizures, tremors, vertigo, transient blindness, frequent falls, frequent headaches, difficulty walking, depression, memory loss, confusion, cold intolerance, heat intolerance, polydipsia, polyphagia, polyuria, unusual weight change, abnormal bruising, bleeding, enlarged lymph nodes, urticaria, allergic rash, hay fever, and recurrent infections.     Objective:   Physical Exam     WD, WN, 76 y/o WF in NAD...  GENERAL:  Alert & oriented; pleasant & cooperative... HEENT:  [place]/AT, EOM-wnl, PERRLA, EACs-clear, TMs-wnl, NOSE-clear, THROAT-clear & wnl. NECK:  Supple w/ fairROM; no JVD; normal carotid impulses w/ faint right bruit; no thyromegaly or nodules palpated;  no lymphadenopathy. CHEST:  Clear to P & A; without wheezes/ rales/ or rhonchi. HEART:  Regular Rhythm; without murmurs/ rubs/ or gallops. ABDOMEN:  Soft & nontender; normal bowel sounds; no organomegaly or masses detected. EXT: without deformities or arthritic changes; mild varicose veins/ venous insuffic/ tr edema. Scar on inner left thigh from melanoma surg, no palp adenopathy... NEURO:  CN's intact; motor testing normal; sensory testing normal; gait normal & balance OK. DERM:  No lesions noted; no rash etc...  RADIOLOGY DATA:  Reviewed in the EPIC EMR & discussed w/ the patient...   LABORATORY DATA:  Reviewed in the EPIC EMR & discussed w/ the patient...    Assessment:     Post-viral syndrome>  Returned from trip to [facility] 07/08/15 w/ URI, viral syndrome & protracted symptoms- given ZPak, then Depo80 + OTC meds and slowly improving. AB, ex-smoker>  She quit smoking 6/12 on a bet w/ her uncle; & she has remain quit!  No resp exac & stable, no meds needed... ~  Asked to wean off the nicorette gum!  Hx MVP>  On ASA, she is asymptomatic w/o CP, palpit, etc...  CHOL>  FLP is much improved on diet & she will not need  meds at this time...  Hypothyroid>  Prev stable on Levothy100 per day, TSH 4/17 = 0.28 & wt down to 116# so we decided to wean the dose to 6mcg/d...  GERD>  She uses OTC Tagamet as needed (her preference)...  Divertics, Colon Polyps>  She is up to date on colon screening w/ no polyps seen 5/15...  Osteopenia>  On Calcium, MVI, Vit D; she stopped her Fosamax on her own in 2011...  Anxiety>  She does not want anxiolytic therapy...  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     Plan:     Patient's Medications  New Prescriptions   No medications on file  Previous Medications   ACYCLOVIR OINTMENT (ZOVIRAX) 5 %    See  admin instructions.   ASPIRIN 81 MG TABLET    Take 81 mg by mouth daily.     CALCIUM CARBONATE-VITAMIN D (CALCIUM + D PO)    Take by mouth daily.     CHOLECALCIFEROL (VITAMIN D) 2000 UNITS CAPS    Take 1 capsule by mouth daily.   CIMETIDINE (TAGAMET) 400 MG TABLET    Take 400 mg by mouth as needed.   CONJUGATED ESTROGENS (PREMARIN) VAGINAL CREAM    Use 1/2 g vaginally with each pessary cleaning.   FLUTICASONE (FLONASE) 50 MCG/ACT NASAL SPRAY    PLACE 2 SPRAYS INTO BOTH NOSTRILS AS NEEDED.   LEVOTHYROXINE (SYNTHROID) 75 MCG TABLET    Take 1 tablet (75 mcg total) by mouth daily before breakfast.   LUTEIN PO    Take 1 tablet by mouth daily.   METHYLPREDNISOLONE (MEDROL DOSEPAK) 4 MG TBPK TABLET    follow package directions   MULTIPLE VITAMIN (MULTIVITAMIN) TABLET    Take 1 tablet by mouth daily.     NICOTINE POLACRILEX (NICORETTE) 2 MG GUM    Take 2 mg by mouth as needed. Not smoking  Modified Medications   No medications on file  Discontinued Medications   AZITHROMYCIN (ZITHROMAX) 250 MG TABLET    Take 1 tablet (250 mg total) by mouth as directed.

## 2015-08-01 NOTE — Patient Instructions (Signed)
Today we updated your med list in our EPIC system...    Continue your current medications the same...  Today we gave you a Depo shot to help w/ the inflammation & post- viral syndrome...  We also did some follow up blood work...    We will contact you w/ the results when available...   We will sched a Doppler ulrasound of your carotid arteries to check for a blockage...    We will contact you w/ the results when available...   Rest at home, drink plenty of fluids, use your FLONASE, MUCINEX, TYLENOL...  Call for any questions.Marland KitchenMarland Kitchen

## 2015-08-05 ENCOUNTER — Telehealth: Payer: Self-pay | Admitting: Pulmonary Disease

## 2015-08-05 NOTE — Telephone Encounter (Signed)
Spoke with patient-she is aware that I am sending phone note to SN to advise on results. Pt would like a call back today.

## 2015-08-05 NOTE — Telephone Encounter (Signed)
Please notify patient> LABS all look good...     Chems- wnl; CBC- wnl; TSH=2.81 is now normal on the Synthroid75; Sed=8 indicates no severe underlying inflamm...    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    Pt is aware of results and recs from SN. Nothing more needed at this time.

## 2015-08-07 ENCOUNTER — Ambulatory Visit (HOSPITAL_COMMUNITY)
Admission: RE | Admit: 2015-08-07 | Discharge: 2015-08-07 | Disposition: A | Discharge status: Home / Self Care | Payer: Medicare Other | Source: Ambulatory Visit | Attending: Pulmonary Disease | Admitting: Pulmonary Disease

## 2015-08-07 DIAGNOSIS — E7800 Pure hypercholesterolemia, unspecified: Secondary | ICD-10-CM | POA: Insufficient documentation

## 2015-08-07 DIAGNOSIS — I6523 Occlusion and stenosis of bilateral carotid arteries: Secondary | ICD-10-CM | POA: Diagnosis not present

## 2015-08-07 DIAGNOSIS — R0989 Other specified symptoms and signs involving the circulatory and respiratory systems: Secondary | ICD-10-CM

## 2015-08-13 ENCOUNTER — Encounter: Payer: Self-pay | Admitting: Obstetrics and Gynecology

## 2015-08-13 ENCOUNTER — Ambulatory Visit (INDEPENDENT_AMBULATORY_CARE_PROVIDER_SITE_OTHER): Payer: Medicare Other | Admitting: Obstetrics and Gynecology

## 2015-08-13 VITALS — BP 126/70 | HR 68 | Resp 16 | Ht 62.0 in | Wt 118.0 lb

## 2015-08-13 DIAGNOSIS — N813 Complete uterovaginal prolapse: Secondary | ICD-10-CM | POA: Diagnosis not present

## 2015-08-13 NOTE — Progress Notes (Signed)
GYNECOLOGY  VISIT   HPI: 75 y.o.   Widowed  Caucasian  female   G1P1001 with Patient's last menstrual period was 02/16/1988 (approximate).   here for  Pessary replacement.   Has 3 cm posterior vaginal wall erythema and ulceration.  Using Premarin vaginal cream 1 gm three times a week for 2 weeks and has left the pessary out for this treatment period.   Wants her pessary back in the vagina.  GYNECOLOGIC HISTORY: Patient's last menstrual period was 02/16/1988 (approximate). Contraception:  Postmenopausal Menopausal hormone therapy:  Premarin Last mammogram:  01/03/15 BIRADS2 benign Last pap smear:   2012 normal        OB History    Gravida Para Term Preterm AB TAB SAB Ectopic Multiple Living   1 1 1       1          Patient Active Problem List   Diagnosis Date Noted  . Right carotid bruit 08/01/2015  . 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 Cystocele 06/13/2013  . Ex-cigarette smoker 05/04/2013  . Asthmatic bronchitis 05/04/2013  . 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 ESOPHAGEAL REFLUX 03/24/2010  . 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 HYPERCHOLESTEROLEMIA, BORDERLINE, WITH HIGH HDL 01/18/2008  . Disorder of bone and cartilage 01/18/2008  . COLONIC POLYPS 01/17/2008  . Hypothyroidism 01/17/2008  . Anxiety state 01/17/2008  . MITRAL VALVE PROLAPSE, HX OF 01/17/2008    Past Medical History  Diagnosis Date  . MVP (mitral valve prolapse)   . Varicose vein   . Hypercholesterolemia   . Hypothyroidism   . Hx of colonic polyps   . Osteopenia   . COPD (chronic obstructive pulmonary disease) ([place])   . Melanoma ([place])     lt upper thigh  . 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 Melanoma of thigh ([place]) 2014    Left   . Prolapsed uterus     Past Surgical History  Procedure Laterality Date  . Breast surgery      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 Patient does not remember exact date of surgery.  . Tonsillectomy    . Colonoscopy    . Cesarean section  1965  . Melanoma excision   12/31/2010    Procedure: MELANOMA EXCISION;  Surgeon: Zenovia Jarred, MD;  Location: [facility];  Service: General;  Laterality: Left;  wide excision melanoma left thigh, excision lesion left thigh  . Other surgical history Right     Surgical repair of architectual distortion Right Breast- Hyperplasia    Current Outpatient Prescriptions  Medication Sig Dispense Refill  . acyclovir ointment (ZOVIRAX) 5 % See admin instructions.  0  . aspirin 81 MG tablet Take 81 mg by mouth daily.      . Calcium Carbonate-Vitamin D (CALCIUM + D PO) Take by mouth daily.      . Cholecalciferol (VITAMIN D) 2000 UNITS CAPS Take 1 capsule by mouth daily.    . cimetidine (TAGAMET) 400 MG tablet Take 400 mg by mouth as needed.    . conjugated estrogens (PREMARIN) vaginal cream Use 1/2 g vaginally with each pessary cleaning. 30 g 1  . fluticasone (FLONASE) 50 MCG/ACT nasal spray PLACE 2 SPRAYS INTO BOTH NOSTRILS AS NEEDED. 16 g 5  . levothyroxine (SYNTHROID) 75 MCG tablet Take 1 tablet (75 mcg total) by mouth daily before breakfast. 30 tablet 11  . LUTEIN PO Take 1 tablet by mouth daily.    . Multiple Vitamin (MULTIVITAMIN) tablet Take 1 tablet by mouth  daily.      . nicotine polacrilex (NICORETTE) 2 MG gum Take 2 mg by mouth as needed. Not smoking     No current facility-administered medications for this visit.     ALLERGIES: Codeine  Family History  Problem Relation Age of Onset  . Diabetes Paternal Grandmother   . Diabetes Paternal Grandfather   . Hypertension Mother   . Congestive Heart Failure Mother   . Thyroid disease Mother   . Hypertension Maternal Grandfather   . CVA Maternal Grandfather   . Stroke Father   . Heart attack Father 61  . Esophageal cancer Neg Hx   . Rectal cancer Neg Hx   . Stomach cancer Neg Hx   . Colon cancer Neg Hx     Social History   Social History  . Marital Status: Widowed    Spouse Name: fred Stucky  . Number of Children: N/A  . Years of  Education: N/A   Occupational History  . Not on file.   Social History Main Topics  . Smoking status: Former Smoker -- 1.00 packs/day for 50 years    Types: Cigarettes    Start date: 03/08/1960    Quit date: 07/23/2010  . Smokeless tobacco: Never Used     Comment: quit smoking 3 years ago  . Alcohol Use: No  . Drug Use: No  . Sexual Activity: No   Other Topics Concern  . Not on file   Social History Narrative    ROS:  Pertinent items are noted in HPI.  PHYSICAL EXAMINATION:    BP 126/70 mmHg  Pulse 68  Resp 16  Ht 5\' 2"  (1.575 m)  Wt 118 lb (53.524 kg)  BMI 21.58 kg/m2  LMP 02/16/1988 (Approximate)    General appearance: alert, cooperative and appears stated age    Pelvic: External genitalia:  no lesions              Urethra:  normal appearing urethra with no masses, tenderness or lesions              Bartholins and Skenes: normal                 Vagina: normal appearing vagina with normal color and discharge, no lesions.  Very minimal erythema of the posterior vaginal cuff.              Cervix: no lesions           Bimanual Exam:  Uterus:  normal size, contour, position, consistency, mobility, non-tender              Adnexa: normal adnexa and no mass, fullness, tenderness          Pessary replaced and one gram of Premarin cream used on pessary.   Chaperone was present for exam.  ASSESSMENT  Complete uterovaginal prolapse.  Vaginal inflammation essentially gone.   PLAN  Continue pessary use.  Patient declines hysterectomy and colpocleisis.  Follow up in 6 weeks for next pessary check.    An After Visit Summary was printed and given to the patient.  ___15___ minutes face to face time of which over 50% was spent in counseling.

## 2015-09-26 ENCOUNTER — Encounter: Payer: Self-pay | Admitting: Obstetrics and Gynecology

## 2015-09-26 ENCOUNTER — Ambulatory Visit (INDEPENDENT_AMBULATORY_CARE_PROVIDER_SITE_OTHER): Payer: Medicare Other | Admitting: Obstetrics and Gynecology

## 2015-09-26 VITALS — BP 120/72 | HR 56 | Ht 62.0 in | Wt 119.2 lb

## 2015-09-26 DIAGNOSIS — N898 Other specified noninflammatory disorders of vagina: Secondary | ICD-10-CM | POA: Diagnosis not present

## 2015-09-26 DIAGNOSIS — N813 Complete uterovaginal prolapse: Secondary | ICD-10-CM | POA: Diagnosis not present

## 2015-09-26 NOTE — Progress Notes (Signed)
GYNECOLOGY  VISIT   HPI: 75 y.o.   Widowed  Caucasian  female   G1P1001 with Patient's last menstrual period was 02/16/1988 (approximate).   here for pessary check.    [place] from the beach.   Having some vaginal odor which has decreased.  No vaginal bleeding or pain.   GYNECOLOGIC HISTORY: Patient's last menstrual period was 02/16/1988 (approximate). Contraception:  postmenopausal Menopausal hormone therapy:  Premarin cream Last mammogram:  12-31-14 3D/Density B/55mm irregular asymmetry in Lt.Br.,Rt.Neg/Diag.Lt.Neg/BiRads2:[facility] Last pap smear:   2012 normal        OB History    Gravida Para Term Preterm AB Living   1 1 1     1    SAB TAB Ectopic Multiple Live Births                     Patient Active Problem List   Diagnosis Date Noted  . Right carotid bruit 08/01/2015  . 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 Cystocele 06/13/2013  . Ex-cigarette smoker 05/04/2013  . Asthmatic bronchitis 05/04/2013  . 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 ESOPHAGEAL REFLUX 03/24/2010  . 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 HYPERCHOLESTEROLEMIA, BORDERLINE, WITH HIGH HDL 01/18/2008  . Disorder of bone and cartilage 01/18/2008  . COLONIC POLYPS 01/17/2008  . Hypothyroidism 01/17/2008  . Anxiety state 01/17/2008  . MITRAL VALVE PROLAPSE, HX OF 01/17/2008    Past Medical History:  Diagnosis Date  . COPD (chronic obstructive pulmonary disease) ([place])   . Hx of colonic polyps   . Hypercholesterolemia   . Hypothyroidism   . Melanoma ([place])    lt upper thigh  . 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 Melanoma of thigh ([place]) 2014   Left   . MVP (mitral valve prolapse)   . Osteopenia   . Prolapsed uterus   . Varicose vein     Past Surgical History:  Procedure Laterality Date  . 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 Patient does not remember exact date of surgery.  . [place]  . COLONOSCOPY    . MELANOMA EXCISION  12/31/2010   Procedure: MELANOMA EXCISION;   Surgeon: Zenovia Jarred, MD;  Location: [facility];  Service: General;  Laterality: Left;  wide excision melanoma left thigh, excision lesion left thigh  . OTHER SURGICAL HISTORY Right    Surgical repair of architectual distortion Right Breast- Hyperplasia  . TONSILLECTOMY      Current Outpatient Prescriptions  Medication Sig Dispense Refill  . acyclovir ointment (ZOVIRAX) 5 % See admin instructions.  0  . aspirin 81 MG tablet Take 81 mg by mouth daily.      . Calcium Carbonate-Vitamin D (CALCIUM + D PO) Take by mouth daily.      . Cholecalciferol (VITAMIN D) 2000 UNITS CAPS Take 1 capsule by mouth daily.    . cimetidine (TAGAMET) 400 MG tablet Take 400 mg by mouth as needed.    . conjugated estrogens (PREMARIN) vaginal cream Use 1/2 g vaginally with each pessary cleaning. 30 g 1  . fluticasone (FLONASE) 50 MCG/ACT nasal spray PLACE 2 SPRAYS INTO BOTH NOSTRILS AS NEEDED. 16 g 5  . levothyroxine (SYNTHROID) 75 MCG tablet Take 1 tablet (75 mcg total) by mouth daily before breakfast. 30 tablet 11  . LUTEIN PO Take 1 tablet by mouth daily.    . Multiple Vitamin (MULTIVITAMIN) tablet Take 1 tablet by mouth daily.      . nicotine  polacrilex (NICORETTE) 2 MG gum Take 2 mg by mouth as needed. Not smoking     No current facility-administered medications for this visit.      ALLERGIES: Codeine  Family History  Problem Relation Age of Onset  . Hypertension Mother   . Congestive Heart Failure Mother   . Thyroid disease Mother   . Stroke Father   . Heart attack Father 75  . Diabetes Paternal Grandmother   . Diabetes Paternal Grandfather   . Hypertension Maternal Grandfather   . CVA Maternal Grandfather   . Esophageal cancer Neg Hx   . Rectal cancer Neg Hx   . Stomach cancer Neg Hx   . Colon cancer Neg Hx     Social History   Social History  . Marital status: Widowed    Spouse name: fred Brideau  . Number of children: N/A  . Years of education: N/A   Occupational  History  . Not on file.   Social History Main Topics  . Smoking status: Former Smoker    Packs/day: 1.00    Years: 50.00    Types: Cigarettes    Start date: 03/08/1960    Quit date: 07/23/2010  . Smokeless tobacco: Never Used     Comment: quit smoking 3 years ago  . Alcohol use No  . Drug use: No  . Sexual activity: No   Other Topics Concern  . Not on file   Social History Narrative  . No narrative on file    ROS:  Pertinent items are noted in HPI.  PHYSICAL EXAMINATION:    BP 120/72 (BP Location: Right Arm, Patient Position: Sitting, Cuff Size: Normal)   Pulse (!) 56   Ht 5\' 2"  (1.575 m)   Wt 119 lb 3.2 oz (54.1 kg)   LMP 02/16/1988 (Approximate)   BMI 21.80 kg/m     General appearance: alert, cooperative and appears stated age   Pelvic: External genitalia:  no lesions              Urethra:  normal appearing urethra with no masses, tenderness or lesions              Bartholins and Skenes: normal                 Vagina:  Posterior vaginal granulation tissue over 2.5 cm area.  Bleeds with contact with Q Tip and Speculum.              Cervix: no lesions                Bimanual Exam:  Uterus:  normal size, contour, position, consistency, mobility, non-tender              Adnexa: no mass, fullness, tenderness       Chaperone was present for exam.  ASSESSMENT  Complete uterovaginal prolapse.  Vaginal inflammation/granulation from pessary use.  Using vaginal Premarin cream.  PLAN  Leave pessary out for 2 weeks and then return.  Patient wants to continue pessary and vaginal estrogen care.  She will continue with the Premarin 1/2 gram pv three times per week. She is really motivated to avoid surgery and would like to come to the office monthly for pessary maintenance care.  We may want to try a different pessary company to see if this will make a difference.  She is due to an annual exam in September and chooses to push this forward also.    An After Visit Summary  was printed and given to the patient.  __15____ minutes face to face time of which over 50% was spent in counseling.

## 2015-10-08 ENCOUNTER — Ambulatory Visit (INDEPENDENT_AMBULATORY_CARE_PROVIDER_SITE_OTHER): Payer: Medicare Other | Admitting: Obstetrics and Gynecology

## 2015-10-08 ENCOUNTER — Encounter: Payer: Self-pay | Admitting: Obstetrics and Gynecology

## 2015-10-08 VITALS — BP 134/72 | HR 50 | Ht 62.0 in | Wt 118.6 lb

## 2015-10-08 DIAGNOSIS — N813 Complete uterovaginal prolapse: Secondary | ICD-10-CM

## 2015-10-08 NOTE — Progress Notes (Signed)
GYNECOLOGY  VISIT   HPI: 75 y.o.   Widowed  Caucasian  female   G1P1001 with Patient's last menstrual period was 02/16/1988 (approximate).   here for pessary check.    Pessary left out at 09/26/15 visit due to 2.5 cm posterior vaginal granulation tissue area.  Has been using Premarin vaginal cream 1/2 gram pv 4 times since last visit.   GYNECOLOGIC HISTORY: Patient's last menstrual period was 02/16/1988 (approximate). Contraception:  Postmenopausal Menopausal hormone therapy:  Premarin cream Last mammogram:   12-31-14 3D/Density B/50mm irregular asymmetry in Lt.Br.,Rt.Neg/Diag.Lt.Neg/BiRads2:[facility] Last pap smear:   2012 normal        OB History    Gravida Para Term Preterm AB Living   1 1 1     1    SAB TAB Ectopic Multiple Live Births                     Patient Active Problem List   Diagnosis Date Noted  . Right carotid bruit 08/01/2015  . 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 Cystocele 06/13/2013  . Ex-cigarette smoker 05/04/2013  . Asthmatic bronchitis 05/04/2013  . 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 ESOPHAGEAL REFLUX 03/24/2010  . 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 HYPERCHOLESTEROLEMIA, BORDERLINE, WITH HIGH HDL 01/18/2008  . Disorder of bone and cartilage 01/18/2008  . COLONIC POLYPS 01/17/2008  . Hypothyroidism 01/17/2008  . Anxiety state 01/17/2008  . MITRAL VALVE PROLAPSE, HX OF 01/17/2008    Past Medical History:  Diagnosis Date  . COPD (chronic obstructive pulmonary disease) ([place])   . Hx of colonic polyps   . Hypercholesterolemia   . Hypothyroidism   . Melanoma ([place])    lt upper thigh  . 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 Melanoma of thigh ([place]) 2014   Left   . MVP (mitral valve prolapse)   . Osteopenia   . Prolapsed uterus   . Varicose vein     Past Surgical History:  Procedure Laterality Date  . 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 Patient does not remember exact date of surgery.  . [place]  . COLONOSCOPY    .  MELANOMA EXCISION  12/31/2010   Procedure: MELANOMA EXCISION;  Surgeon: Zenovia Jarred, MD;  Location: [facility];  Service: General;  Laterality: Left;  wide excision melanoma left thigh, excision lesion left thigh  . OTHER SURGICAL HISTORY Right    Surgical repair of architectual distortion Right Breast- Hyperplasia  . TONSILLECTOMY      Current Outpatient Prescriptions  Medication Sig Dispense Refill  . acyclovir ointment (ZOVIRAX) 5 % See admin instructions.  0  . aspirin 81 MG tablet Take 81 mg by mouth daily.      . Calcium Carbonate-Vitamin D (CALCIUM + D PO) Take by mouth daily.      . Cholecalciferol (VITAMIN D) 2000 UNITS CAPS Take 1 capsule by mouth daily.    . cimetidine (TAGAMET) 400 MG tablet Take 400 mg by mouth as needed.    . conjugated estrogens (PREMARIN) vaginal cream Use 1/2 g vaginally with each pessary cleaning. 30 g 1  . fluticasone (FLONASE) 50 MCG/ACT nasal spray PLACE 2 SPRAYS INTO BOTH NOSTRILS AS NEEDED. 16 g 5  . levothyroxine (SYNTHROID) 75 MCG tablet Take 1 tablet (75 mcg total) by mouth daily before breakfast. 30 tablet 11  . LUTEIN PO Take 1 tablet by mouth daily.    . Multiple Vitamin (MULTIVITAMIN) tablet Take  1 tablet by mouth daily.      . nicotine polacrilex (NICORETTE) 2 MG gum Take 2 mg by mouth as needed. Not smoking     No current facility-administered medications for this visit.      ALLERGIES: Codeine  Family History  Problem Relation Age of Onset  . Hypertension Mother   . Congestive Heart Failure Mother   . Thyroid disease Mother   . Stroke Father   . Heart attack Father 63  . Diabetes Paternal Grandmother   . Diabetes Paternal Grandfather   . Hypertension Maternal Grandfather   . CVA Maternal Grandfather   . Esophageal cancer Neg Hx   . Rectal cancer Neg Hx   . Stomach cancer Neg Hx   . Colon cancer Neg Hx     Social History   Social History  . Marital status: Widowed    Spouse name: fred Forker  . Number  of children: N/A  . Years of education: N/A   Occupational History  . Not on file.   Social History Main Topics  . Smoking status: Former Smoker    Packs/day: 1.00    Years: 50.00    Types: Cigarettes    Start date: 03/08/1960    Quit date: 07/23/2010  . Smokeless tobacco: Never Used     Comment: quit smoking 3 years ago  . Alcohol use No  . Drug use: No  . Sexual activity: No   Other Topics Concern  . Not on file   Social History Narrative  . No narrative on file    ROS:  Pertinent items are noted in HPI.  PHYSICAL EXAMINATION:    BP 134/72 (BP Location: Right Arm, Patient Position: Sitting, Cuff Size: Normal)   Pulse (!) 50   Ht 5\' 2"  (1.575 m)   Wt 118 lb 9.6 oz (53.8 kg)   LMP 02/16/1988 (Approximate)   BMI 21.69 kg/m     General appearance: alert, cooperative and appears stated age   Pelvic: External genitalia:  no lesions              Urethra:  normal appearing urethra with no masses, tenderness or lesions              Bartholins and Skenes: normal                 Vagina: normal appearing vagina with normal color and discharge, no lesions              Cervix: no lesions.  No vaginal or cervical ulceration, granulation tissue or bleeding.                 Bimanual Exam:  Vagina without palpable lesions.             Pessary replaced with Premarin vaginal cream 1 gram per vagina.   Chaperone was present for exam.  ASSESSMENT  Complete uterovaginal prolapse. Normal vaginal/cervical mucosa.  PLAN  Continue pessary use.  Follow up monthly.  Continue Premarin vaginal cream.  Call for pain, discharge, or bleeding.    An After Visit Summary was printed and given to the patient.  _15_____ minutes face to face time of which over 50% was spent in counseling.

## 2015-10-09 ENCOUNTER — Ambulatory Visit: Payer: Medicare Other | Admitting: Obstetrics and Gynecology

## 2015-10-10 ENCOUNTER — Ambulatory Visit: Payer: Medicare Other | Admitting: Obstetrics and Gynecology

## 2015-10-27 ENCOUNTER — Ambulatory Visit (INDEPENDENT_AMBULATORY_CARE_PROVIDER_SITE_OTHER): Payer: Medicare Other | Admitting: Obstetrics and Gynecology

## 2015-10-27 ENCOUNTER — Encounter: Payer: Self-pay | Admitting: Obstetrics and Gynecology

## 2015-10-27 ENCOUNTER — Telehealth: Payer: Self-pay | Admitting: Obstetrics and Gynecology

## 2015-10-27 ENCOUNTER — Other Ambulatory Visit: Payer: Self-pay | Admitting: Obstetrics and Gynecology

## 2015-10-27 VITALS — BP 154/80 | HR 60 | Ht 62.0 in | Wt 120.0 lb

## 2015-10-27 DIAGNOSIS — R300 Dysuria: Secondary | ICD-10-CM | POA: Diagnosis not present

## 2015-10-27 DIAGNOSIS — N898 Other specified noninflammatory disorders of vagina: Secondary | ICD-10-CM

## 2015-10-27 DIAGNOSIS — R319 Hematuria, unspecified: Secondary | ICD-10-CM

## 2015-10-27 LAB — POCT URINALYSIS DIPSTICK
BILIRUBIN UA: NEGATIVE
Glucose, UA: NEGATIVE
Ketones, UA: NEGATIVE
NITRITE UA: NEGATIVE
PH UA: 5
Protein, UA: NEGATIVE
UROBILINOGEN UA: NEGATIVE

## 2015-10-27 MED ORDER — NITROFURANTOIN MONOHYD MACRO 100 MG PO CAPS: 100.0000 mg | ORAL_CAPSULE | Freq: Two times a day (BID) | ORAL | 0 refills | Status: DC

## 2015-10-27 NOTE — Progress Notes (Signed)
GYNECOLOGY  VISIT   HPI: 75 y.o.   Widowed  Caucasian  female   G1P1001 with Patient's last menstrual period was 02/16/1988 (approximate).   here for vaginal irritation on outside only. Pain with urinating at the end of stream but not everytime. Patient used some Metrogel on outside of vagina. Reports itching and burning internally and externally.   Some pain at the end of urination.  No blood in the urine.  No vaginal bleeding.   Feels not well.  No fever or chills.  No nausea or vomiting.   Did take Tylenol  for discomfort.   Last UTI was E Coli in October.  Took Ciprofloxacin and did well with that. States she does not tolerate Sulfa.  Urine Dip: 2+WBCs, Trace RBCs  GYNECOLOGIC HISTORY: Patient's last menstrual period was 02/16/1988 (approximate). Contraception:  Postmenopausal Menopausal hormone therapy:  Premarin Cream Last mammogram:  12-31-14 3D/Density B/58mm irregular asymmetry in Lt.Br.,Rt.Br.Neg/Diag.Lt.Neg/BiRads2:[facility] Last pap smear:   2012 normal        OB History    Gravida Para Term Preterm AB Living   1 1 1     1    SAB TAB Ectopic Multiple Live Births                     Patient Active Problem List   Diagnosis Date Noted  . Right carotid bruit 08/01/2015  . 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 Cystocele 06/13/2013  . Ex-cigarette smoker 05/04/2013  . Asthmatic bronchitis 05/04/2013  . 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 ESOPHAGEAL REFLUX 03/24/2010  . 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 HYPERCHOLESTEROLEMIA, BORDERLINE, WITH HIGH HDL 01/18/2008  . Disorder of bone and cartilage 01/18/2008  . COLONIC POLYPS 01/17/2008  . Hypothyroidism 01/17/2008  . Anxiety state 01/17/2008  . MITRAL VALVE PROLAPSE, HX OF 01/17/2008    Past Medical History:  Diagnosis Date  . COPD (chronic obstructive pulmonary disease) ([place])   . Hx of colonic polyps   . Hypercholesterolemia   . Hypothyroidism   . Melanoma ([place])    lt upper thigh  . Melanoma of thigh ([place])  02/15/2011  . Melanoma of thigh ([place]) 2014   Left   . MVP (mitral valve prolapse)   . Osteopenia   . Prolapsed uterus   . Varicose vein     Past Surgical History:  Procedure Laterality Date  . 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 Patient does not remember exact date of surgery.  . [place]  . COLONOSCOPY    . MELANOMA EXCISION  12/31/2010   Procedure: MELANOMA EXCISION;  Surgeon: Zenovia Jarred, MD;  Location: [facility];  Service: General;  Laterality: Left;  wide excision melanoma left thigh, excision lesion left thigh  . OTHER SURGICAL HISTORY Right    Surgical repair of architectual distortion Right Breast- Hyperplasia  . TONSILLECTOMY      Current Outpatient Prescriptions  Medication Sig Dispense Refill  . acyclovir ointment (ZOVIRAX) 5 % See admin instructions. Prn cold sores  0  . aspirin 81 MG tablet Take 81 mg by mouth daily.      . Calcium Carbonate-Vitamin D (CALCIUM + D PO) Take by mouth daily.      . Cholecalciferol (VITAMIN D) 2000 UNITS CAPS Take 1 capsule by mouth daily.    . cimetidine (TAGAMET) 400 MG tablet Take 400 mg by mouth as needed.    . conjugated estrogens (PREMARIN) vaginal cream Use 1/2  g vaginally with each pessary cleaning. 30 g 1  . fluticasone (FLONASE) 50 MCG/ACT nasal spray PLACE 2 SPRAYS INTO BOTH NOSTRILS AS NEEDED. 16 g 5  . levothyroxine (SYNTHROID) 75 MCG tablet Take 1 tablet (75 mcg total) by mouth daily before breakfast. 30 tablet 11  . LUTEIN PO Take 1 tablet by mouth daily.    . Multiple Vitamin (MULTIVITAMIN) tablet Take 1 tablet by mouth daily.      . nicotine polacrilex (NICORETTE) 2 MG gum Take 2 mg by mouth as needed. Not smoking     No current facility-administered medications for this visit.      ALLERGIES: Codeine  Family History  Problem Relation Age of Onset  . Hypertension Mother   . Congestive Heart Failure Mother   . Thyroid disease Mother   . Stroke Father   .  Heart attack Father 63  . Diabetes Paternal Grandmother   . Diabetes Paternal Grandfather   . Hypertension Maternal Grandfather   . CVA Maternal Grandfather   . Esophageal cancer Neg Hx   . Rectal cancer Neg Hx   . Stomach cancer Neg Hx   . Colon cancer Neg Hx     Social History   Social History  . Marital status: Widowed    Spouse name: fred Walby  . Number of children: N/A  . Years of education: N/A   Occupational History  . Not on file.   Social History Main Topics  . Smoking status: Former Smoker    Packs/day: 1.00    Years: 50.00    Types: Cigarettes    Start date: 03/08/1960    Quit date: 07/23/2010  . Smokeless tobacco: Never Used     Comment: quit smoking 3 years ago  . Alcohol use No  . Drug use: No  . Sexual activity: No   Other Topics Concern  . Not on file   Social History Narrative  . No narrative on file    ROS:  Pertinent items are noted in HPI.  PHYSICAL EXAMINATION:    BP (!) 154/80 (BP Location: Right Arm, Patient Position: Sitting, Cuff Size: Normal)   Pulse 60   Ht 5\' 2"  (1.575 m)   Wt 120 lb (54.4 kg)   LMP 02/16/1988 (Approximate)   BMI 21.95 kg/m     General appearance: alert, cooperative and appears stated age.  Not as cheerful today as usul.    Pelvic: External genitalia:  no lesions              Urethra:  normal appearing urethra with no masses, tenderness or lesions              Bartholins and Skenes: normal                 Vagina: Affirm swab performed before pessary taken out.                            Normal appearing vagina with normal color and discharge, no lesions.  No erythema.               Cervix: no lesions                Bimanual Exam:  Uterus:  normal size, contour, position, consistency, mobility, non-tender              Adnexa: no mass, fullness, tenderness        Pessary removed, cleansed, and  given to patient in a biobag.  Chaperone was present for exam.  ASSESSMENT   Dysuria.  Vaginal irritation.    PLAN  Affirm swab.  Urine micro and culture.  Will treat for UTI with Macrobid 100 mg po bid x 7 days. Will replace pessary after Affirm results are back and determine if needs tx for vaginitis.    An After Visit Summary was printed and given to the patient.  __15____ minutes face to face time of which over 50% was spent in counseling.

## 2015-10-27 NOTE — Telephone Encounter (Signed)
Per Dr. Quincy Simmonds called patient and advised to come to office now and we will work her in. Patient understands and will come to office for work in appointment now. [facility] office notified.

## 2015-10-27 NOTE — Telephone Encounter (Signed)
Spoke with patient. Patient has a pessary in place. Patient states that at the end of last week she began to have vaginal discomfort, burning, and itching. Reports she has been applying Metronidazole with her finger externally to her vaginal area without relief. Denies any bleeding or vaginal discharge. Reports she is experiencing burning after urination as well. Denies any lower back pain, fever, or chills. Per patient has a history of UTI. Reports she is not feeling herself. Patient is scheduled for pessary removal and cleaning next week. Advised patient she will need to be seen earlier for evaluation. She is agreeable. She is available today to be seen for evaluation. Advised I will speak with Dr.Silva and return call. Patient is agreeable.  Dr.Silva, no current schedule openings for today. Please review and advise. Okay for work in?

## 2015-10-27 NOTE — Telephone Encounter (Signed)
Patient called requesting an appointment with Dr. Quincy Simmonds for a "vaginal infection." She'd like to see Dr. Quincy Simmonds only.  Routing to triage per Gay Filler.

## 2015-10-27 NOTE — Telephone Encounter (Signed)
Thank you for having patient come in.  Encounter closed.

## 2015-10-28 LAB — URINALYSIS, MICROSCOPIC ONLY
Bacteria, UA: NONE SEEN [HPF]
CASTS: NONE SEEN [LPF]
CRYSTALS: NONE SEEN [HPF]
WBC, UA: >60 WBC/HPF — AB (ref <=5)
Yeast: NONE SEEN [HPF]

## 2015-10-28 LAB — WET PREP BY MOLECULAR PROBE
Candida species: NEGATIVE
Gardnerella vaginalis: NEGATIVE
TRICHOMONAS VAG: NEGATIVE

## 2015-10-29 ENCOUNTER — Encounter: Payer: Self-pay | Admitting: Obstetrics and Gynecology

## 2015-10-29 LAB — URINE CULTURE

## 2015-11-03 ENCOUNTER — Ambulatory Visit (INDEPENDENT_AMBULATORY_CARE_PROVIDER_SITE_OTHER): Payer: Medicare Other | Admitting: Obstetrics and Gynecology

## 2015-11-03 ENCOUNTER — Encounter: Payer: Self-pay | Admitting: Obstetrics and Gynecology

## 2015-11-03 VITALS — BP 118/70 | HR 68 | Temp 98.9°F | Resp 16 | Ht 62.0 in | Wt 119.0 lb

## 2015-11-03 DIAGNOSIS — R5381 Other malaise: Secondary | ICD-10-CM | POA: Diagnosis not present

## 2015-11-03 DIAGNOSIS — N3090 Cystitis, unspecified without hematuria: Secondary | ICD-10-CM

## 2015-11-03 DIAGNOSIS — N390 Urinary tract infection, site not specified: Secondary | ICD-10-CM

## 2015-11-03 DIAGNOSIS — R82998 Other abnormal findings in urine: Secondary | ICD-10-CM

## 2015-11-03 LAB — POCT URINALYSIS DIPSTICK
BILIRUBIN UA: NEGATIVE
Blood, UA: NEGATIVE
Glucose, UA: NEGATIVE
KETONES UA: NEGATIVE
LEUKOCYTES UA: NEGATIVE
Nitrite, UA: NEGATIVE
PH UA: 5
Urobilinogen, UA: NEGATIVE

## 2015-11-03 LAB — COMPREHENSIVE METABOLIC PANEL
ALK PHOS: 47 U/L (ref 33–130)
ALT: 27 U/L (ref 6–29)
AST: 35 U/L (ref 10–35)
Albumin: 4 g/dL (ref 3.6–5.1)
BUN: 11 mg/dL (ref 7–25)
CO2: 26 mmol/L (ref 20–31)
CREATININE: 0.72 mg/dL (ref 0.60–0.93)
Calcium: 8.9 mg/dL (ref 8.6–10.4)
Chloride: 101 mmol/L (ref 98–110)
GLUCOSE: 99 mg/dL (ref 65–99)
POTASSIUM: 4.2 mmol/L (ref 3.5–5.3)
SODIUM: 138 mmol/L (ref 135–146)
TOTAL PROTEIN: 6.4 g/dL (ref 6.1–8.1)
Total Bilirubin: 0.3 mg/dL (ref 0.2–1.2)

## 2015-11-03 LAB — CBC WITH DIFFERENTIAL/PLATELET
BASOS ABS: 75 {cells}/uL (ref 0–200)
BASOS PCT: 1 %
EOS ABS: 75 {cells}/uL (ref 15–500)
EOS PCT: 1 %
HCT: 36.6 % (ref 35.0–45.0)
Hemoglobin: 12.3 g/dL (ref 11.7–15.5)
LYMPHS PCT: 22 %
Lymphs Abs: 1650 cells/uL (ref 850–3900)
MCH: 31.5 pg (ref 27.0–33.0)
MCHC: 33.6 g/dL (ref 32.0–36.0)
MCV: 93.6 fL (ref 80.0–100.0)
MONOS PCT: 9 %
MPV: 11.2 fL (ref 7.5–12.5)
Monocytes Absolute: 675 cells/uL (ref 200–950)
Neutro Abs: 5025 cells/uL (ref 1500–7800)
Neutrophils Relative %: 67 %
PLATELETS: 201 10*3/uL (ref 140–400)
RBC: 3.91 MIL/uL (ref 3.80–5.10)
RDW: 15.1 % — AB (ref 11.0–15.0)
WBC: 7.5 10*3/uL (ref 3.8–10.8)

## 2015-11-03 MED ORDER — CIPROFLOXACIN HCL 500 MG PO TABS: 500.0000 mg | ORAL_TABLET | Freq: Two times a day (BID) | ORAL | 0 refills | Status: DC

## 2015-11-03 MED ORDER — CEFTRIAXONE SODIUM 500 MG IJ SOLR
250.0000 mg | Freq: Once | INTRAMUSCULAR | Status: AC
  Administered 2015-11-03: 250 mg via INTRAMUSCULAR

## 2015-11-03 NOTE — Addendum Note (Signed)
Addended by: Lowella Fairy on: 11/03/2015 02:58 PM   Modules accepted: Orders

## 2015-11-03 NOTE — Progress Notes (Signed)
GYNECOLOGY  VISIT   HPI: 75 y.o.   Widowed  Caucasian  female   G1P1001 with Patient's last menstrual period was 02/16/1988 (approximate).   here for   Low back pain/low grade fever. Some nausea yesterday afternoon.  No vomiting. Feeling unsteady.  No dysuria.   Finished her Macrobid 100 mg po bid for Klebsiella UTI from urine specimen on 10/27/15.   Had a negative vaginitis Affirm test.   Pessary has been out.   Has been to void without the pessary in.   Went to a football game yesterday and got very heated.  Took several Tylenol yesterday for temp 98.8 - 99. Some chills last hs.   Not resting well.   Urine dip - Trace WBCs, trace protein.   GYNECOLOGIC HISTORY: Patient's last menstrual period was 02/16/1988 (approximate). Contraception:  Postmenopausal Menopausal hormone therapy:  Premarin Cream Last mammogram:  12/31/14 3D/Density B/14mm irregular asymmetry in Lt. Br., Rt Br.Neg/Diag.Lt.Neg/BiRads2:[facility] Last pap smear:   2012 Normal        OB History    Gravida Para Term Preterm AB Living   1 1 1     1    SAB TAB Ectopic Multiple Live Births                     Patient Active Problem List   Diagnosis Date Noted  . Right carotid bruit 08/01/2015  . 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 Cystocele 06/13/2013  . Ex-cigarette smoker 05/04/2013  . Asthmatic bronchitis 05/04/2013  . 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 ESOPHAGEAL REFLUX 03/24/2010  . 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 HYPERCHOLESTEROLEMIA, BORDERLINE, WITH HIGH HDL 01/18/2008  . Disorder of bone and cartilage 01/18/2008  . COLONIC POLYPS 01/17/2008  . Hypothyroidism 01/17/2008  . Anxiety state 01/17/2008  . MITRAL VALVE PROLAPSE, HX OF 01/17/2008    Past Medical History:  Diagnosis Date  . COPD (chronic obstructive pulmonary disease) ([place])   . Hx of colonic polyps   . Hypercholesterolemia   . Hypothyroidism   . Melanoma ([place])    lt upper thigh  . 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 Melanoma of thigh  ([place]) 2014   Left   . MVP (mitral valve prolapse)   . Osteopenia   . Prolapsed uterus   . Varicose vein     Past Surgical History:  Procedure Laterality Date  . 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 Patient does not remember exact date of surgery.  . [place]  . COLONOSCOPY    . MELANOMA EXCISION  12/31/2010   Procedure: MELANOMA EXCISION;  Surgeon: Zenovia Jarred, MD;  Location: [facility];  Service: General;  Laterality: Left;  wide excision melanoma left thigh, excision lesion left thigh  . OTHER SURGICAL HISTORY Right    Surgical repair of architectual distortion Right Breast- Hyperplasia  . TONSILLECTOMY      Current Outpatient Prescriptions  Medication Sig Dispense Refill  . acyclovir ointment (ZOVIRAX) 5 % See admin instructions. Prn cold sores  0  . aspirin 81 MG tablet Take 81 mg by mouth daily.      . Calcium Carbonate-Vitamin D (CALCIUM + D PO) Take by mouth daily.      . Cholecalciferol (VITAMIN D) 2000 UNITS CAPS Take 1 capsule by mouth daily.    . cimetidine (TAGAMET) 400 MG tablet Take 400 mg by mouth as needed.    . conjugated estrogens (PREMARIN) vaginal cream  Use 1/2 g vaginally with each pessary cleaning. 30 g 1  . fluticasone (FLONASE) 50 MCG/ACT nasal spray PLACE 2 SPRAYS INTO BOTH NOSTRILS AS NEEDED. 16 g 5  . levothyroxine (SYNTHROID) 75 MCG tablet Take 1 tablet (75 mcg total) by mouth daily before breakfast. 30 tablet 11  . LUTEIN PO Take 1 tablet by mouth daily.    . Multiple Vitamin (MULTIVITAMIN) tablet Take 1 tablet by mouth daily.      . nicotine polacrilex (NICORETTE) 2 MG gum Take 2 mg by mouth as needed. Not smoking     No current facility-administered medications for this visit.      ALLERGIES: Codeine  Family History  Problem Relation Age of Onset  . Hypertension Mother   . Congestive Heart Failure Mother   . Thyroid disease Mother   . Stroke Father   . Heart attack Father 86  .  Diabetes Paternal Grandmother   . Diabetes Paternal Grandfather   . Hypertension Maternal Grandfather   . CVA Maternal Grandfather   . Esophageal cancer Neg Hx   . Rectal cancer Neg Hx   . Stomach cancer Neg Hx   . Colon cancer Neg Hx     Social History   Social History  . Marital status: Widowed    Spouse name: fred Agerton  . Number of children: N/A  . Years of education: N/A   Occupational History  . Not on file.   Social History Main Topics  . Smoking status: Former Smoker    Packs/day: 1.00    Years: 50.00    Types: Cigarettes    Start date: 03/08/1960    Quit date: 07/23/2010  . Smokeless tobacco: Never Used     Comment: quit smoking 3 years ago  . Alcohol use No  . Drug use: No  . Sexual activity: No   Other Topics Concern  . Not on file   Social History Narrative  . No narrative on file    ROS:  Pertinent items are noted in HPI.  PHYSICAL EXAMINATION:    BP 118/70 (BP Location: Right Arm, Patient Position: Sitting, Cuff Size: Normal)   Pulse 68   Resp 16   Ht 5\' 2"  (1.575 m)   Wt 119 lb (54 kg)   LMP 02/16/1988 (Approximate)   BMI 21.77 kg/m     General appearance: alert, cooperative and appears stated age.  Looks tired.  Head: Normocephalic, without obvious abnormality, atraumatic Lungs: clear to auscultation bilaterally Heart: regular rate and rhythm Abdomen: soft, non-tender, no masses,  no organomegaly   Pelvic: External genitalia:  no lesions              Urethra:  normal appearing urethra with no masses, tenderness or lesions                           Bimanual Exam:  Uterus:  normal size, contour, position, consistency, mobility, non-tender              Adnexa: no mass, fullness, tenderness                Catheterization - verbal consent for procedure.  Sterile prep with betadine.  Cath performed under sterile conditions - concentrated urine obtained.  9 cc.   Chaperone was present for exam.  ASSESSMENT  Klebsiella UTI.  Status post  Macrobid for one week.  Malaise.  I suspect persistent infection and dehydration.    PLAN  Ceftriaxone 250 mg IM now.  Ciprofloxacin 500 mg po bid for 7 days.  Urine micro and culture.  CMP and CBC with diff.  Hydrate with Gatorade.  If worse, go to PCP.  Follow up here in 4 days.  Eventually will place pessary back in vagina.  This is not done today.   An After Visit Summary was printed and given to the patient.  __25____ minutes face to face time of which over 50% was spent in counseling.

## 2015-11-05 ENCOUNTER — Ambulatory Visit: Payer: Medicare Other | Admitting: Obstetrics and Gynecology

## 2015-11-05 LAB — URINE CULTURE: Organism ID, Bacteria: NO GROWTH

## 2015-11-06 ENCOUNTER — Ambulatory Visit: Payer: Medicare Other | Admitting: Obstetrics and Gynecology

## 2015-11-06 LAB — URINALYSIS, MICROSCOPIC ONLY
Bacteria, UA: NONE SEEN [HPF]
Casts: NONE SEEN [LPF]
Crystals: NONE SEEN [HPF]
Squamous Epithelial / LPF: NONE SEEN [HPF] (ref <=5)
YEAST: NONE SEEN [HPF]

## 2015-11-07 ENCOUNTER — Encounter: Payer: Self-pay | Admitting: Obstetrics and Gynecology

## 2015-11-07 ENCOUNTER — Ambulatory Visit (INDEPENDENT_AMBULATORY_CARE_PROVIDER_SITE_OTHER): Payer: Medicare Other | Admitting: Obstetrics and Gynecology

## 2015-11-07 VITALS — BP 124/70 | HR 70 | Resp 16 | Ht 62.0 in | Wt 119.0 lb

## 2015-11-07 DIAGNOSIS — N813 Complete uterovaginal prolapse: Secondary | ICD-10-CM | POA: Diagnosis not present

## 2015-11-07 NOTE — Patient Instructions (Signed)
Reduce your caffeine intake and preferably switch to decaf coffee and beverages.   Hydrate well with water.   Use the Premarin cream 1/2 gram to vagina at bed time twice weekly.  You may place a little around the urethral opening at this time.

## 2015-11-07 NOTE — Progress Notes (Signed)
GYNECOLOGY  VISIT   HPI: 75 y.o.   Widowed  Caucasian  female   G1P1001 with Patient's last menstrual period was 02/16/1988 (approximate).   here for  4 day follow up & possible pessary reinsertion. Patient seen earlier this week for malaise following tx for UTI.  Was suspected to have ongoing Klebsiella UTI and was tx with Rocephin and then course of Ciprofloxaxin.  Final repeat UC was negative.   Felt better 24 hours after receiving Rocephin and doing hydration at home.  Dizziness stopped.  Eating is back to usual.  Has stopped drinking caffeine.   GYNECOLOGIC HISTORY: Patient's last menstrual period was 02/16/1988 (approximate). Contraception:  postmenopausal Menopausal hormone therapy:  Premarin cream Last mammogram:  12/31/14 3D/Density B/31mm irregular asymmetry in Lt. Br., Rt Br.Neg/Diag.Lt.Neg/BiRads2:[facility].  Has appointment.  Last pap smear:  2012 neg        OB History    Gravida Para Term Preterm AB Living   1 1 1     1    SAB TAB Ectopic Multiple Live Births                     Patient Active Problem List   Diagnosis Date Noted  . Right carotid bruit 08/01/2015  . 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 Cystocele 06/13/2013  . Ex-cigarette smoker 05/04/2013  . Asthmatic bronchitis 05/04/2013  . 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 ESOPHAGEAL REFLUX 03/24/2010  . 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 HYPERCHOLESTEROLEMIA, BORDERLINE, WITH HIGH HDL 01/18/2008  . Disorder of bone and cartilage 01/18/2008  . COLONIC POLYPS 01/17/2008  . Hypothyroidism 01/17/2008  . Anxiety state 01/17/2008  . MITRAL VALVE PROLAPSE, HX OF 01/17/2008    Past Medical History:  Diagnosis Date  . COPD (chronic obstructive pulmonary disease) ([place])   . Hx of colonic polyps   . Hypercholesterolemia   . Hypothyroidism   . Melanoma ([place])    lt upper thigh  . 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 Melanoma of thigh ([place]) 2014   Left   . MVP (mitral valve prolapse)   . Osteopenia   . Prolapsed  uterus   . Varicose vein     Past Surgical History:  Procedure Laterality Date  . 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 Patient does not remember exact date of surgery.  . [place]  . COLONOSCOPY    . MELANOMA EXCISION  12/31/2010   Procedure: MELANOMA EXCISION;  Surgeon: Zenovia Jarred, MD;  Location: [facility];  Service: General;  Laterality: Left;  wide excision melanoma left thigh, excision lesion left thigh  . OTHER SURGICAL HISTORY Right    Surgical repair of architectual distortion Right Breast- Hyperplasia  . TONSILLECTOMY      Current Outpatient Prescriptions  Medication Sig Dispense Refill  . acyclovir ointment (ZOVIRAX) 5 % See admin instructions. Prn cold sores  0  . aspirin 81 MG tablet Take 81 mg by mouth daily.      . Calcium Carbonate-Vitamin D (CALCIUM + D PO) Take by mouth daily.      . Cholecalciferol (VITAMIN D) 2000 UNITS CAPS Take 1 capsule by mouth daily.    . cimetidine (TAGAMET) 400 MG tablet Take 400 mg by mouth as needed.    . ciprofloxacin (CIPRO) 500 MG tablet Take 1 tablet (500 mg total) by mouth 2 (two) times daily. 14 tablet 0  . conjugated estrogens (PREMARIN) vaginal cream Use 1/2 g  vaginally with each pessary cleaning. 30 g 1  . fluticasone (FLONASE) 50 MCG/ACT nasal spray PLACE 2 SPRAYS INTO BOTH NOSTRILS AS NEEDED. 16 g 5  . levothyroxine (SYNTHROID) 75 MCG tablet Take 1 tablet (75 mcg total) by mouth daily before breakfast. 30 tablet 11  . LUTEIN PO Take 1 tablet by mouth daily.    . Multiple Vitamin (MULTIVITAMIN) tablet Take 1 tablet by mouth daily.      . nicotine polacrilex (NICORETTE) 2 MG gum Take 2 mg by mouth as needed. Not smoking     No current facility-administered medications for this visit.      ALLERGIES: Codeine  Family History  Problem Relation Age of Onset  . Hypertension Mother   . Congestive Heart Failure Mother   . Thyroid disease Mother   . Stroke Father   .  Heart attack Father 2  . Diabetes Paternal Grandmother   . Diabetes Paternal Grandfather   . Hypertension Maternal Grandfather   . CVA Maternal Grandfather   . Esophageal cancer Neg Hx   . Rectal cancer Neg Hx   . Stomach cancer Neg Hx   . Colon cancer Neg Hx     Social History   Social History  . Marital status: Widowed    Spouse name: fred Morga  . Number of children: N/A  . Years of education: N/A   Occupational History  . Not on file.   Social History Main Topics  . Smoking status: Former Smoker    Packs/day: 1.00    Years: 50.00    Types: Cigarettes    Start date: 03/08/1960    Quit date: 07/23/2010  . Smokeless tobacco: Never Used     Comment: quit smoking 3 years ago  . Alcohol use No  . Drug use: No  . Sexual activity: No   Other Topics Concern  . Not on file   Social History Narrative  . No narrative on file    ROS:  Pertinent items are noted in HPI.  PHYSICAL EXAMINATION:    LMP 02/16/1988 (Approximate)     General appearance: alert, cooperative and appears stated age.  Good spirits and energetic.  ASSESSMENT  Complete uterovaginal prolapse.  Recent Klebsiella UTI.  Recent dehydration.   PLAN  Pessary ring with support placed with Premarin cream 1/2 gram on pessary.  Reduce or eliminate caffeine use.  Use Premarin cream 1/2 gram pv at hs twice weekly with a small amount to the urethral area.  Follow up beginning of November for annual exam.    An After Visit Summary was printed and given to the patient.  __15__ minutes face to face time of which over 50% was spent in counseling.

## 2015-12-01 ENCOUNTER — Encounter: Payer: Self-pay | Admitting: Pulmonary Disease

## 2015-12-01 ENCOUNTER — Ambulatory Visit (INDEPENDENT_AMBULATORY_CARE_PROVIDER_SITE_OTHER): Payer: Medicare Other | Admitting: Pulmonary Disease

## 2015-12-01 VITALS — BP 134/70 | HR 63 | Temp 98.8°F | Ht 62.0 in | Wt 118.4 lb

## 2015-12-01 DIAGNOSIS — C4372 Malignant melanoma of left lower limb, including hip: Secondary | ICD-10-CM

## 2015-12-01 DIAGNOSIS — J069 Acute upper respiratory infection, unspecified: Secondary | ICD-10-CM | POA: Diagnosis not present

## 2015-12-01 DIAGNOSIS — J4520 Mild intermittent asthma, uncomplicated: Secondary | ICD-10-CM

## 2015-12-01 DIAGNOSIS — N814 Uterovaginal prolapse, unspecified: Secondary | ICD-10-CM | POA: Diagnosis not present

## 2015-12-01 DIAGNOSIS — E039 Hypothyroidism, unspecified: Secondary | ICD-10-CM | POA: Diagnosis not present

## 2015-12-01 MED ORDER — METHYLPREDNISOLONE ACETATE 80 MG/ML IJ SUSP
80.0000 mg | Freq: Once | INTRAMUSCULAR | Status: AC
  Administered 2015-12-01: 80 mg via INTRAMUSCULAR

## 2015-12-01 MED ORDER — AZITHROMYCIN 250 MG PO TABS: ORAL_TABLET | ORAL | 0 refills | Status: DC

## 2015-12-01 NOTE — Progress Notes (Signed)
Subjective:     Patient ID: Ariel Holmes, female   DOB: 1941-01-13, 75 y.o.   MRN: [id number]  HPI 75 y/o WF here for a follow up visit... she has multiple medical problems as noted below...   ~  SEE PREV EPIC NOTES FOR OLDER DATA >>    CXR 3/13 showed heart is wnl, lungs clear x sl incr markings, NAD.[name]  LABS 3/13:  FLP- Improved on diet;  Chems- wnl;  CBC- wnl;  TSH=0.71 on Levothy100/d...  LABS 11/13:  Chems- wnl;  CBC- wnl...  CXR 3/14 showed normal heart size, calcif & ectasia of Ao, clear lungs, DJD/ sl scoliosis of spine/ osteopenia...  LABS 3/14:  FLP- not at goals on diet alone but hi HDL;  TSH=0.42;  VitD=60   CXR 3/15 showed norm heart size, clear lungs, mild DJD spine, no lesions/ NAD...  PFT 3/15 showed FVC=2.00 (76%), FEV1=1.56 (77%), %1sec=78%, mid-flows=75% predicted... Interpret: No signif obstruction, can't r/o mild restriction w/o lung vol measurement- SMN  BMD 3/15 showed TScore -0.5 in Spine and -1.4 in [facility]; Rec to continue Calcium, MVI, VitD & exercise...  ~  June 11, 2014:  37mo ROV & Ariel Holmes has had a rough time since Ariel Holmes passed away 2014-03-04; she works Scientist, research (physical sciences) for [employer] & that has helped keeping her busy,,, she is c/o some nasal congestion, drainage, & blowing beige mucus; draining to her chest but denies hemooptysis, SOB, CP, f/c/s; she is fatigued and achey- using Flonase and Saline... She quit smoking 4 yrs ago & still chews nicorette gum- we discussed weaning this down already! We reviewed the following medical problems during today's office visit >>     AR, AB, ex-smoker> on OTC Antihist, Flonase, & Nicorette; she quit smoking 6/12 but still using Nicotine replacement & we discussed weaning this "medication"...    MVP> on ASA81; she denies CP, palpit, SOB, edema, etc; she is active & doing satis...    Ven Insuffic> she knows to avoid sodium, elev legs, wear support hose...    Chol> on diet alone esp w/ her good HDL; Labs 4/16  showed TChol 196, TG 93, HDL 76, LDL 101    Hypothy> on Synthroid100; Labs 4/16 showed TSH= 0.89; she remains clinically & biochem euthyroid...    GI- GERD, Polyps, Hems> stable w/o abd pain, dysphagia, n/v, c/d, blood seen; last colon 5/15 by DrDBrodie showed divertics in sigmoid, melanosis, no recurrent polyps (she rec f/u 10 yrs).    Osteopenia> on Calcium, MVI, VitD2000; she stopped prev Fosamax Rx yrs ago- last BMD 2009 w/ Tscore -1.5 in [facility]...    Anxiety> husb Ariel Holmes passed away Mar 04, 2014 after a long illness, she is doing satis & declines anxiolytic meds...    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 We reviewed prob list, meds, xrays and labs> see below for updates >>   CXR 05/2014 showed norm heart size, clear lungs, NAD...   LABS 4/16:  FLP- at goals on diet;  TSH=.89;  Prev Chems/ CBC 01/2014 were wnl...   ~  June 12, 2015:  53yr ROV & Ariel Holmes has had some interval GYN problems w/ prolapsed uterus "my insides fell out" & currently managed w/ a pessary- changed every 93mo & she is holding off on surg consideration (she did see Urology for PT, exercises);  She quit smoking 5 yrs ago & I note that she is still using nicorette  gum daily!  She notes that her breathing is OK, no cough, sputum, hemoptysis, and denies SOB, states she's walking 3-4 mi per day w/ friends & denies problems;  She had some neck/ shoulder pain & managed this w/ massage therapy & accupuncture/ "cupping" which really helped she says...    AR, AB, ex-smoker> on OTC Antihist, Flonase, & Nicorette; she quit smoking 6/12 but still using Nicotine replacement & we discussed weaning this "medication"; breathing is good, exercising daily.    MVP> on ASA81; she denies CP, palpit, SOB, edema, etc; she is active & doing satis...    Ven Insuffic> she knows to avoid sodium, elev legs, wear support hose...    Chol> on diet alone esp w/  her good HDL; Labs 4/17 showed TChol 200, TG 74, HDL 87, LDL 98    Hypothy> on Synthroid100; Labs 4/17 showed TSH= 0.28; she remains clinically stable but wt is down to 116# so we decided to decr the Synthroid to 48mcg/d.    GI- GERD, Polyps, Hems> stable w/o abd pain, dysphagia, n/v, c/d, blood seen; last colon 5/15 by DrDBrodie showed divertics in sigmoid, melanosis, no recurrent polyps (she rec f/u 10 yrs).    GYN> followed by DrSilva w/ uterine proalpse, she has a pessary (cleaned & inspected Q71mo) and she as seen Urology for PT & kaegel exercises...    Osteopenia> on Calcium, MVI, VitD2000; she stopped prev Fosamax Rx yrs ago- last BMD 2009 w/ Tscore -1.5 in [facility]...    Anxiety> husb Ariel Holmes passed away 2014-03-21 after a long illness, she is doing satis & declines anxiolytic meds...    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 EXAM shows Afeb, VSS, O2sat=97% on RA;  HEENT- neg, mallampati1;  Chest- clear w/o w/r/r;  Heart- RR w/o m/r/g;  Abd- soft, nontender, neg;  Ext- w/o c/c/e;  Neuro- intact...  CXR 06/12/15>  Norm heart size,clear lungs w/ sl incr markings, NAD, DDD in Tspine...  LABS 01/2015 by DrEnnever>  Chems- wnl;  CBC- ok w/ Hg=12.1;  Vit D=61...  LABS 06/12/15>  FLP- all parameters at goals on diet alone;  TSH= 0.28 on Synthroid100 IMP/PLAN>>  [name] is stable overall, has quit smoking 5 yrs ago but still using nicorette daily- again asked to wean off;  Her TSH is sl oversuppressed on Synthroid100 & wt is down to 116# so we will decr dose to 4mcg/d & plan recheck TFTs;  She was given PREVNAR-13 today;  We plan ROV in 32yr, sooner if needed prn...  ~  August 01, 2015:  6wk ROV & Ariel Holmes tells me that she went to [place]/ [facility] in May & returned 5/23 c/o "feeling yukey", low grade temp 99+, cough/ sinus drainage/ blowing yellow blood-streaked sput/ sore throat;  ZPak called in for pt & she  improved some but "this sounds crazy" but every afternoon the symptoms felt like they were coming back; we recommended a Medrol dosepak but pt refused this med- took OTC Mucinex, Flonase, Tylenol and called several times just still feeling bad, no energy, etc=> we brought her in for recheck...  We reviewed the problem list above.    Recall that last OV her TSH=0.28 on Synthroid100 & we cut her back to 36mcg/d but she has felt worse on this dose=> we will recheck labs today... EXAM shows Afeb, VSS, O2sat=100% on RA;  HEENT- neg, mallampati1;  Neck- soft right carotid bruit;  Chest-  clear w/o w/r/r;  Heart- RR w/o m/r/g;  Abd- soft, nontender, neg;  Ext- w/o c/c/e;  Neuro- intact...  Labs 08/01/15>  Chems- wnl;  CBC- essent wnl;  TSH=2.81 on the Synthroid75- continue same;  Sed=8 therefore no signif inflamm...  Carotid Doppler>  Heterogeneous plaque bilat & 1-39% bilat ICAstenoses (on the high end in the left carotid);  Rec- take ASA81mg /d and repeat CDoppler 61yr... IMP/PLAN>>  Jan appears to have a post-viral syndrome & in the last few days she started feeling sl better, we discussed Depo80/ rest/ fluids/ continued OTC rx w/ Mucinex, Flonase, Tylenol, etc;  Her exam is neg x for a soft right CBruit heard today=> check CDoppler;  She had ?prob (feeling worse) within a few days of decreasing her Synthroid from 173mcg to 71mcg in April & we rechecked labs today...  ~  December 01, 2015:  81mo ROV & add-on appt requested for URI>  Fabiha tells me she's been working the [employer] exposed to folks from all over; notes 3d hx URI w/ cough, min clear sput, no discoloration or blood, feeling tired, "yucky", aching/ sore, ?swollen glands, wants to sleep; denies CP, SOB, wheezing, etc; notes temp 98.8 today & says "thats a fever for me"... We reviewed her problem list & meds- as above, stable, except she's seen her GYN- DrSilva x6 since Jun2017 w/ UTI x2, uterine prolapse w/ pessary as she declines surg options...      EXAM shows Afeb, VSS, O2sat=99% on RA;  HEENT- neg x sl red, mallampati1;  Chest- clear w/o w/r/r;  Heart- RR w/o m/r/g;  Abd- soft, nontender, neg;  Ext- w/o c/c/e;  Neuro- intact... IMP/PLAN>>  Quran appears to have an upper resp infection, suspect viral but she'd apprec Rx w/ Depo & ZPak- done per request 7 rec to rest w/ fluids, Tylenol, etc...          PROBLEM LIST:    ALLERGIC RHINITIS - uses FLONASE Prn...  ASTHMATIC BRONCHITIS, ACUTE (ICD-466.0) - no recent bronchitic exac... not on regular meds. ~  CXR 12/09 showed hyperinflation & incr markings, spondylosis in TSpine... 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 ~  CXR 3/13 showed heart size is wnl, lungs clear x sl incr markings, NAD. ~  CXR 3/14 showed normal heart size, calcif & ectasia of Ao, clear lungs, DJD/ sl scoliosis of spine/ osteopenia. ~  CXR 3/15 showed norm heart size, clear lungs, mild DJD spine, no lesions/ NAD. ~  PTF 3/15 showed FVC=2.00 (76%), FEV1=1.56 (77%), %1sec=78%, mid-flows=75% predicted... Interpret: No signif obstruction, can't r/o mild restriction w/o lung vol measurement ~  CXR 4/16 showed norm heart size, clear lungs, NAD. 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 ~  Still using nicorette gum daily 7 asked to wean down...  MITRAL VALVE PROLAPSE, HX OF (ICD-V12.50) - on ASA 81mg /d... hx mild MVP on 2DEcho in 1995 (no signif MR)... asymptomatic without CP or palpit... 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 she knows to avoid sodium, elevate legs, wear support hose, etc...  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 Keep up the good work. ~  [place] 3/14 on diet alone showed TChol 228, TG 92, HDL 88, LDL 109 ~  FLP 3/15 on diet alone showed TChol 209, TG 78, HDL 83, LDL 105  ~  FLP 4/16 on diet alone showed TChol 196, TG 93, HDL 76, LDL 101  ~  FLP 4/17 on diet alone showed TChol 200, TG 74, HDL 87, LDL 98  HYPOTHYROIDISM (ICD-244.9) - on LEVOTHYROID 165mcg/d... ~  labs 12/09 showed TSH= 0.44 ~  labs 1/11 showed TSH= 0.66 ~  labs 1/12 showed TSH= 0.85 ~  Labs 3/13 on Levo100 showed TSH= 0.71 ~  Labs 3/14 on Levo100 showed TSH=0.42 ~  Labs 3/15 on Levo100 showed TSH= 0.43 ~  Labs 4/16 on Levo100 showing TSH= 0.89 ~  Labs 4/17 on Levo100 showed TSH=0.28, her wt is down to 116# & we decided to decr the Synthroid dose to 32mcg/d...  ESOPHAGEAL REFLUX (ICD-530.81) - she uses Cimetadine 300mg  Prn for "Stomach" & requests refill from [place]...  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 ~  colonoscopy 4/00 by DrDBrodie showed several 6-43mm polyps= adenomatous... ~  colonoscopy 4/05 by DrDBrodie showed only melanosis and hems... ~  colonoscopy 5/10 DrDBrodie showed mild divertics, 2 cecal polyps- tubular adenoma, f/u 54yrs. ~  colonoscopy 5/15 DrDBrodie showed divertics in sigmoid, melanosis, no recurrent polyps (she rec f/u 10 yrs).  OSTEOPENIA (ICD-733.90) - on calcium, MVI, Vit D 2000 u daily... she stopped her prev Fosamax rx on her own in 2011. ~  BMD here 8/07 showed TScores -1.2 in Spine, and -1.8 in left FemNeck. ~  labs 8/08 showed Vit D level= 38 ~  BMD here 9/09 showed TScores -0.8 in Spine, and -1.5 in left FemNeck. ~  Labs 1/11 showed Vit D level = 51 ~  Labs 3/14 showed Vit D level = 60 ~  Labs 3/15 showed Vit D level = 79 ~  BMD 3/15 showed Tscore -1.4 & rec to continue MVI,  VitD, wt bearing exercises.  ANXIETY (ICD-300.00) - not currently on meds.  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 ~  She reports on-going follow up from [place] ?every 3-34mo? ~  11/13: she saw DrEnnever for follow up> on observation, doing well w/o recurrence...  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 ~  She continues to f/u w/ DrEnnever yearly...  Health Maintenance: ~  neg Mamogram at [facility]- followed yearly... due for GYN check by [facility] & she will call. ~  Immunizations:  she gets the yearly seasonal Flu vaccines... she received Shingles vaccine 2/09 at [facility]... had PNEUMOVAX in 2002 & 1/12 (age 22)... given TDAP 1/12.   Past Surgical History:  Procedure Laterality Date  . 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 Patient does not remember exact date of surgery.  [name]  [place]  . COLONOSCOPY    . MELANOMA EXCISION  12/31/2010   Procedure: MELANOMA EXCISION;  Surgeon: Zenovia Jarred, MD;  Location: [facility];  Service: General;  Laterality: Left;  wide excision melanoma left thigh, excision lesion left thigh  . OTHER SURGICAL HISTORY Right    Surgical repair of architectual distortion Right Breast- Hyperplasia  . TONSILLECTOMY      Outpatient Encounter Prescriptions as of 12/01/2015  Medication Sig Dispense Refill  . acyclovir ointment (ZOVIRAX) 5 % See admin instructions. Prn cold sores  0  . aspirin 81 MG tablet Take 81 mg by mouth daily.      . Calcium Carbonate-Vitamin D (CALCIUM + D PO) Take by mouth daily.      . Cholecalciferol (VITAMIN D) 2000 UNITS CAPS Take 1 capsule  by mouth daily.    . cimetidine (TAGAMET) 400 MG tablet Take 400 mg by mouth as needed.    . conjugated estrogens (PREMARIN) vaginal cream Use 1/2 g vaginally with each pessary cleaning. 30 g 1  . fluticasone (FLONASE) 50 MCG/ACT nasal spray PLACE 2 SPRAYS INTO BOTH NOSTRILS AS NEEDED. 16 g 5  . levothyroxine (SYNTHROID) 75 MCG tablet Take 1 tablet (75 mcg total) by mouth daily before breakfast. 30 tablet 11  . LUTEIN PO Take 1 tablet by mouth daily.    . Multiple Vitamin (MULTIVITAMIN) tablet Take 1 tablet by mouth daily.      . nicotine polacrilex (NICORETTE) 2 MG gum Take 2 mg by mouth as needed. Not smoking    . azithromycin (ZITHROMAX) 250 MG tablet Take as directed 6 tablet 0  . [EXPIRED] methylPREDNISolone acetate (DEPO-MEDROL) injection 80 mg      No facility-administered encounter medications on file as of 12/01/2015.     Allergies  Allergen Reactions  . Codeine Nausea And Vomiting    Current Medications, Allergies, Past Medical History, Past Surgical History, Family History, and Social History were reviewed in [employer] record.   Review of Systems         The patient complains of fatigue, dyspnea on exertion, cough, gas/bloating, incontinence, joint pain, arthritis, and anxiety.  The patient denies fever, chills, sweats, anorexia, weakness, malaise, weight loss, sleep disorder, blurring, diplopia, eye irritation, eye discharge, vision loss, eye pain, photophobia, earache, ear discharge, tinnitus, decreased hearing, nasal congestion, nosebleeds, sore throat, hoarseness, chest pain, palpitations, syncope, orthopnea, PND, peripheral edema, dyspnea at rest, excessive sputum, hemoptysis, wheezing, pleurisy, nausea, vomiting, diarrhea, constipation, change in bowel habits, abdominal pain, melena, hematochezia, jaundice, indigestion/heartburn, dysphagia, odynophagia, dysuria, hematuria, urinary frequency, urinary hesitancy, nocturia, back pain, joint swelling,  muscle cramps, muscle weakness, stiffness, sciatica, restless legs, leg pain at night, leg pain with exertion, rash, itching, dryness, suspicious lesions, paralysis, paresthesias, seizures, tremors, vertigo, transient blindness, frequent falls, frequent headaches, difficulty walking, depression, memory loss, confusion, cold intolerance, heat intolerance, polydipsia, polyphagia, polyuria, unusual weight change, abnormal bruising, bleeding, enlarged lymph nodes, urticaria, allergic rash, hay fever, and recurrent infections.     Objective:   Physical Exam     WD, WN, 75 y/o WF in NAD...  GENERAL:  Alert & oriented; pleasant & cooperative... HEENT:  [place]/AT, EOM-wnl, PERRLA, EACs-clear, TMs-wnl, NOSE-clear, THROAT-clear & wnl. NECK:  Supple w/ fairROM; no JVD; normal carotid impulses w/ faint right bruit; no thyromegaly or nodules palpated; no lymphadenopathy. CHEST:  Clear to P & A; without wheezes/ rales/ or rhonchi. HEART:  Regular Rhythm; without murmurs/ rubs/ or gallops. ABDOMEN:  Soft & nontender; normal bowel sounds; no organomegaly or masses detected. EXT: without deformities or arthritic changes; mild varicose veins/ venous insuffic/ tr edema. Scar on inner left thigh from melanoma surg, no palp adenopathy... NEURO:  CN's intact; motor testing normal; sensory testing normal; gait normal & balance OK. DERM:  No lesions noted; no rash etc...  RADIOLOGY DATA:  Reviewed in the EPIC EMR & discussed w/ the patient...   LABORATORY DATA:  Reviewed in the EPIC EMR & discussed w/ the patient...    Assessment:      Viral URI>  Similar to the below viral syndrome from [id number]- we will Rx w/ Depo80, ZPak, she declines dosepak ("I don't do well w/ pred")... Post-viral syndrome>  Returned from trip to [facility] 07/08/15 w/ URI, viral syndrome & protracted symptoms- given ZPak, then Depo80 + OTC meds and slowly improving. AB, ex-smoker>  She quit smoking 6/12 on a bet w/ her uncle; & she has remain  quit!  No resp exac & stable, no meds needed... ~  Asked to wean off the nicorette gum!  Hx MVP>  On ASA, she is asymptomatic w/o CP, palpit, etc...  CHOL>  FLP is much improved on diet & she will not need meds at this time...  Hypothyroid>  Prev stable on Levothy100 per day, TSH 4/17 = 0.28 & wt down to 116# so we decided to wean the dose to 3mcg/d...  GERD>  She uses OTC Tagamet as needed (her preference)...  Divertics, Colon Polyps>  She is up to date on colon screening w/ no polyps seen 5/15...  Osteopenia>  On Calcium, MVI, Vit D; she stopped her Fosamax on her own in 2011...  Anxiety>  She does not want anxiolytic therapy...  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     Plan:     Patient's Medications  New Prescriptions   AZITHROMYCIN (ZITHROMAX) 250 MG TABLET    Take as directed  Previous Medications   ACYCLOVIR OINTMENT (ZOVIRAX) 5 %    See admin instructions. Prn cold sores   ASPIRIN 81 MG TABLET    Take 81 mg by mouth daily.     CALCIUM CARBONATE-VITAMIN D (CALCIUM + D PO)    Take by mouth daily.     CHOLECALCIFEROL (VITAMIN D) 2000 UNITS CAPS    Take 1 capsule by mouth daily.   CIMETIDINE (TAGAMET) 400 MG TABLET    Take 400 mg by mouth as needed.   CONJUGATED ESTROGENS (PREMARIN) VAGINAL CREAM    Use 1/2 g vaginally with each pessary cleaning.   FLUTICASONE (FLONASE) 50 MCG/ACT NASAL SPRAY    PLACE 2 SPRAYS INTO BOTH NOSTRILS AS NEEDED.   LEVOTHYROXINE (SYNTHROID) 75 MCG TABLET    Take 1 tablet (75 mcg total) by mouth daily before breakfast.   LUTEIN PO    Take 1 tablet by mouth daily.   MULTIPLE VITAMIN (MULTIVITAMIN) TABLET    Take 1 tablet by mouth daily.     NICOTINE POLACRILEX (NICORETTE) 2 MG GUM    Take 2 mg by mouth as needed. Not smoking  Modified Medications   No medications on file  Discontinued Medications    No medications on file

## 2015-12-01 NOTE — Patient Instructions (Signed)
Today we updated your med list in our EPIC system...    Continue your current medications the same...  We decided to treat your upper resp infection w/ a Depo shot & ZPak...    Rest, Fluids (chicken soup is great!), Tylenol...  Call for any questions or if we can be of service in any way.Marland KitchenMarland Kitchen

## 2015-12-16 ENCOUNTER — Ambulatory Visit (INDEPENDENT_AMBULATORY_CARE_PROVIDER_SITE_OTHER): Payer: Medicare Other

## 2015-12-16 DIAGNOSIS — Z23 Encounter for immunization: Secondary | ICD-10-CM | POA: Diagnosis not present

## 2015-12-24 ENCOUNTER — Ambulatory Visit (INDEPENDENT_AMBULATORY_CARE_PROVIDER_SITE_OTHER): Payer: Medicare Other | Admitting: Obstetrics and Gynecology

## 2015-12-24 ENCOUNTER — Encounter: Payer: Self-pay | Admitting: Obstetrics and Gynecology

## 2015-12-24 VITALS — BP 118/68 | HR 68 | Resp 16 | Ht 62.0 in | Wt 116.0 lb

## 2015-12-24 DIAGNOSIS — Z01411 Encounter for gynecological examination (general) (routine) with abnormal findings: Secondary | ICD-10-CM

## 2015-12-24 DIAGNOSIS — N813 Complete uterovaginal prolapse: Secondary | ICD-10-CM

## 2015-12-24 NOTE — Patient Instructions (Signed)

## 2015-12-24 NOTE — Progress Notes (Signed)
75 y.o. G75P1001 Widowed Caucasian female here for annual exam.    Has a lump on the vulvar area.  Not painful.   Using a pessary and vagina estrogen cream.  Uses Premarin only once a week.   Recent URI during [employer].  Received a Z pack and injection of steroid.  Feels much better now.  Received flu vaccine last week.   Has had pneumonia vaccine and shingles vaccine.   PCP:   Dr. Teressa Lower. Dr. Burney Gauze.  Dr. Elvera Lennox - dermatology.   Patient's last menstrual period was 02/16/1988 (approximate).           Sexually active: No.  The current method of family planning is post menopausal status.    Exercising: Yes.    walking Smoker:  no  Health Maintenance: Pap:  2012 normal History of abnormal Pap:  no MMG:  12-31-14 3D/Density B/42mm irregular focal asymmetry in Lt.Br.;01-03-15 Lt.Diag represents superimposed breast tissue/Neg/BiRads2/screening 1year:[facility].  Has an appointment at [facility]. Colonoscopy:  07/13/13 - Dr. Olevia Perches - diverticulosis, repeat in 10 years.  BMD:   04/17/13  Result  Osteopenia with Dr. Lenna Gilford TDaP:  2012 Screening Labs:  Hb today: PCP, Urine today: PCP   reports that she quit smoking about 5 years ago. Her smoking use included Cigarettes. She started smoking about 55 years ago. She has a 50.00 pack-year smoking history. She has never used smokeless tobacco. She reports that she does not drink alcohol or use drugs.  Past Medical History:  Diagnosis Date  . COPD (chronic obstructive pulmonary disease) ([place])   . Hx of colonic polyps   . Hypercholesterolemia   . Hypothyroidism   . Melanoma ([place])    lt upper thigh  . Melanoma of thigh ([place]) 02/15/2011  . Melanoma of thigh ([place]) 2014   Left   . MVP (mitral valve prolapse)   . Osteopenia   . Prolapsed uterus   . Varicose vein     Past Surgical History:  Procedure Laterality Date  . 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 Patient does not remember exact date of surgery.   . [place]  . COLONOSCOPY    . MELANOMA EXCISION  12/31/2010   Procedure: MELANOMA EXCISION;  Surgeon: Zenovia Jarred, MD;  Location: [facility];  Service: General;  Laterality: Left;  wide excision melanoma left thigh, excision lesion left thigh  . OTHER SURGICAL HISTORY Right    Surgical repair of architectual distortion Right Breast- Hyperplasia  . TONSILLECTOMY      Current Outpatient Prescriptions  Medication Sig Dispense Refill  . acyclovir ointment (ZOVIRAX) 5 % See admin instructions. Prn cold sores  0  . aspirin 81 MG tablet Take 81 mg by mouth daily.      . Calcium Carbonate-Vitamin D (CALCIUM + D PO) Take by mouth daily.      . Cholecalciferol (VITAMIN D) 2000 UNITS CAPS Take 1 capsule by mouth daily.    . cimetidine (TAGAMET) 400 MG tablet Take 400 mg by mouth as needed.    . conjugated estrogens (PREMARIN) vaginal cream Use 1/2 g vaginally with each pessary cleaning. 30 g 1  . fluticasone (FLONASE) 50 MCG/ACT nasal spray PLACE 2 SPRAYS INTO BOTH NOSTRILS AS NEEDED. 16 g 5  . levothyroxine (SYNTHROID) 75 MCG tablet Take 1 tablet (75 mcg total) by mouth daily before breakfast. 30 tablet 11  . LUTEIN PO Take 1 tablet by mouth daily.    [name]  Multiple Vitamin (MULTIVITAMIN) tablet Take 1 tablet by mouth daily.      . nicotine polacrilex (NICORETTE) 2 MG gum Take 2 mg by mouth as needed. Not smoking     No current facility-administered medications for this visit.     Family History  Problem Relation Age of Onset  . Hypertension Mother   . Congestive Heart Failure Mother   . Thyroid disease Mother   . Stroke Father   . Heart attack Father 39  . Diabetes Paternal Grandmother   . Diabetes Paternal Grandfather   . Hypertension Maternal Grandfather   . CVA Maternal Grandfather   . Esophageal cancer Neg Hx   . Rectal cancer Neg Hx   . Stomach cancer Neg Hx   . Colon cancer Neg Hx     ROS:  Pertinent items are noted in HPI.  Otherwise, a  comprehensive ROS was negative.  Exam:   BP 118/68 (BP Location: Right Arm, Patient Position: Sitting, Cuff Size: Normal)   Pulse 68   Resp 16   Ht 5\' 2"  (1.575 m)   Wt 116 lb (52.6 kg)   LMP 02/16/1988 (Approximate)   BMI 21.22 kg/m     General appearance: alert, cooperative and appears stated age Head: Normocephalic, without obvious abnormality, atraumatic Neck: no adenopathy, supple, symmetrical, trachea midline and thyroid normal to inspection and palpation Lungs: clear to auscultation bilaterally Breasts: normal appearance, no masses or tenderness, No nipple retraction or dimpling, No nipple discharge or bleeding, No axillary or supraclavicular adenopathy Heart: regular rate and rhythm Abdomen: soft, non-tender; no masses, no organomegaly Extremities: extremities normal, atraumatic, no cyanosis or edema Skin: Skin color, texture, turgor normal. No rashes or lesions Lymph nodes: Cervical, supraclavicular, and axillary nodes normal. No abnormal inguinal nodes palpated Neurologic: Grossly normal  Pelvic: External genitalia:  no lesions.  & mm left lateral mons sebaceous cyst, nontender.              Urethra:  normal appearing urethra with no masses, tenderness or lesions              Bartholins and Skenes: normal                 Vagina: normal appearing vagina with normal color and discharge, no lesions              Cervix: no lesions              Pap taken: No.  Declined by patient. Bimanual Exam:  Uterus:  normal size, contour, position, consistency, mobility, non-tender              Adnexa: no mass, fullness, tenderness              Rectal exam: Yes.  .  Confirms.              Anus:  normal sphincter tone, no lesions  Pessary removed, cleanse and replaced with Premarin cream 1 gram.  Chaperone was present for exam.  Assessment:   Well woman visit with complete uterovaginal prolapse. Doing well with pessary.  Hx right breast atypia.  Hypothyroidism.  Hx melanoma.   Osteopenia.   Plan: Yearly mammogram recommended after age 40.  Has appt.  Recommended self breast exam.  Pap and HR HPV as above. Guidelines for Calcium, Vitamin D, regular exercise program including cardiovascular and weight bearing exercise. BMD due through PCP.  Labs through PCP and oncology.  Continue dermatology visits.  Continue Premarin vaginal cream 1/2  gram pv twice a week and with pessary change.  Return for pessary care in 6 weeks. Follow up annually and prn.       After visit summary provided.

## 2016-01-01 ENCOUNTER — Encounter: Payer: Self-pay | Admitting: Pulmonary Disease

## 2016-01-26 ENCOUNTER — Ambulatory Visit (HOSPITAL_BASED_OUTPATIENT_CLINIC_OR_DEPARTMENT_OTHER): Payer: Medicare Other | Admitting: Hematology & Oncology

## 2016-01-26 ENCOUNTER — Other Ambulatory Visit (HOSPITAL_BASED_OUTPATIENT_CLINIC_OR_DEPARTMENT_OTHER): Payer: Medicare Other

## 2016-01-26 ENCOUNTER — Encounter: Payer: Self-pay | Admitting: Hematology & Oncology

## 2016-01-26 VITALS — BP 147/70 | HR 60 | Temp 97.7°F | Resp 16 | Ht 62.0 in | Wt 117.0 lb

## 2016-01-26 DIAGNOSIS — Z85820 Personal history of malignant melanoma of skin: Secondary | ICD-10-CM

## 2016-01-26 DIAGNOSIS — C4372 Malignant melanoma of left lower limb, including hip: Secondary | ICD-10-CM

## 2016-01-26 DIAGNOSIS — M810 Age-related osteoporosis without current pathological fracture: Secondary | ICD-10-CM

## 2016-01-26 LAB — CBC WITH DIFFERENTIAL (CANCER CENTER ONLY)
BASO#: 0 10*3/uL (ref 0.0–0.2)
BASO%: 0.3 % (ref 0.0–2.0)
EOS ABS: 0.1 10*3/uL (ref 0.0–0.5)
EOS%: 0.6 % (ref 0.0–7.0)
HCT: 36.1 % (ref 34.8–46.6)
HEMOGLOBIN: 12.2 g/dL (ref 11.6–15.9)
LYMPH#: 4.7 10*3/uL — AB (ref 0.9–3.3)
LYMPH%: 48.6 % — AB (ref 14.0–48.0)
MCH: 32.6 pg (ref 26.0–34.0)
MCHC: 33.8 g/dL (ref 32.0–36.0)
MCV: 97 fL (ref 81–101)
MONO#: 1.1 10*3/uL — AB (ref 0.1–0.9)
MONO%: 11.1 % (ref 0.0–13.0)
NEUT%: 39.4 % — ABNORMAL LOW (ref 39.6–80.0)
NEUTROS ABS: 3.8 10*3/uL (ref 1.5–6.5)
PLATELETS: 245 10*3/uL (ref 145–400)
RBC: 3.74 10*6/uL (ref 3.70–5.32)
RDW: 15.3 % (ref 11.1–15.7)
WBC: 9.6 10*3/uL (ref 3.9–10.0)

## 2016-01-26 LAB — COMPREHENSIVE METABOLIC PANEL
ALBUMIN: 3.8 g/dL (ref 3.5–5.0)
ALK PHOS: 64 U/L (ref 40–150)
ALT: 16 U/L (ref 0–55)
ANION GAP: 10 meq/L (ref 3–11)
AST: 20 U/L (ref 5–34)
BILIRUBIN TOTAL: 0.36 mg/dL (ref 0.20–1.20)
BUN: 15 mg/dL (ref 7.0–26.0)
CO2: 26 meq/L (ref 22–29)
CREATININE: 0.8 mg/dL (ref 0.6–1.1)
Calcium: 9.7 mg/dL (ref 8.4–10.4)
Chloride: 106 mEq/L (ref 98–109)
EGFR: 78 mL/min/{1.73_m2} — ABNORMAL LOW (ref >90)
GLUCOSE: 46 mg/dL — AB (ref 70–140)
Potassium: 3.6 mEq/L (ref 3.5–5.1)
Sodium: 142 mEq/L (ref 136–145)
TOTAL PROTEIN: 7.2 g/dL (ref 6.4–8.3)

## 2016-01-26 LAB — LACTATE DEHYDROGENASE: LDH: 154 U/L (ref 125–245)

## 2016-01-26 NOTE — Progress Notes (Signed)
Hematology and Oncology Follow Up Visit  Ariel Holmes [id number] [birth year] 75 y.o. 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  Current Therapy:    Observation     Interim History:  Ms.  Holmes is in for followup. She had a better year. His been almost 2 years now that her husband has passed on.  She went to [facility] over the summer. She had a good time in [place]. Next summer she will be going to [place].  She has not noted any suspicious skin lesions. Where she had the melanoma on the left thigh that has healed very nicely.  She gets her mammograms yearly. She recently had her mammogram done. She says that everything came out normal.  She's had no cough. She's had no shortness of breath. She's had no change in bowel or bladder habits. She's had no nausea or vomiting.  Overall, her performance status is ECOG 1.  Medications:  Current Outpatient Prescriptions:  .  acyclovir ointment (ZOVIRAX) 5 %, See admin instructions. Prn cold sores, Disp: , Rfl: 0 .  aspirin 81 MG tablet, Take 81 mg by mouth daily.  , Disp: , Rfl:  .  Calcium Carbonate-Vitamin D (CALCIUM + D PO), Take by mouth daily.  , Disp: , Rfl:  .  Cholecalciferol (VITAMIN D) 2000 UNITS CAPS, Take 1 capsule by mouth daily., Disp: , Rfl:  .  cimetidine (TAGAMET) 400 MG tablet, Take 400 mg by mouth as needed., Disp: , Rfl:  .  conjugated estrogens (PREMARIN) vaginal cream, Use 1/2 g vaginally with each pessary cleaning., Disp: 30 g, Rfl: 1 .  fluticasone (FLONASE) 50 MCG/ACT nasal spray, PLACE 2 SPRAYS INTO BOTH NOSTRILS AS NEEDED., Disp: 16 g, Rfl: 5 .  levothyroxine (SYNTHROID) 75 MCG tablet, Take 1 tablet (75 mcg total) by mouth daily before breakfast., Disp: 30 tablet, Rfl: 11 .  LUTEIN PO, Take 1 tablet by mouth daily., Disp: , Rfl:  .  Multiple Vitamin (MULTIVITAMIN) tablet, Take 1 tablet by mouth daily.  , Disp: , Rfl:  .  nicotine polacrilex (NICORETTE) 2 MG  gum, Take 2 mg by mouth as needed. Not smoking, Disp: , Rfl:   Allergies:  Allergies  Allergen Reactions  . Codeine Nausea And Vomiting    Past Medical History, Surgical history, Social history, and Family History were reviewed and updated.  Review of Systems: As above  Physical Exam:  height is 5\' 2"  (1.575 m) and weight is 117 lb (53.1 kg). Her oral temperature is 97.7 F (36.5 C). Her blood pressure is 147/70 (abnormal) and her pulse is 60. Her respiration is 16.   We'll do and well-nourished white female. There are no ocular or oral lesions. She is no palpable cervical or supraclavicular lymph nodes. Lungs are clear. Cardiac exam regular  rate and rhythm with no murmurs rubs or bruits. Abdomen is soft. She has good bowel sounds. There is no fluid wave. There is no palpable liver or spleen tip. Extremities shows a well-healed wide local excision scar on the inner upper left thigh. No swelling is noted in the left leg. There is no left inguinal adenopathy. Right leg is unremarkable. Skin exam shows no rashes ecchymosis or petechia.  Lab Results  Component Value Date   WBC 9.6 01/26/2016   HGB 12.2 01/26/2016   HCT 36.1 01/26/2016   MCV 97 01/26/2016   PLT 245 01/26/2016     Chemistry  Component Value Date/Time   NA 138 11/03/2015 1452   NA 141 01/27/2015 1017   K 4.2 11/03/2015 1452   K 3.8 01/27/2015 1017   CL 101 11/03/2015 1452   CL 104 01/28/2014 1132   CO2 26 11/03/2015 1452   CO2 25 01/27/2015 1017   BUN 11 11/03/2015 1452   BUN 14.6 01/27/2015 1017   CREATININE 0.72 11/03/2015 1452   CREATININE 0.7 01/27/2015 1017      Component Value Date/Time   CALCIUM 8.9 11/03/2015 1452   CALCIUM 9.3 01/27/2015 1017   ALKPHOS 47 11/03/2015 1452   ALKPHOS 61 01/27/2015 1017   AST 35 11/03/2015 1452   AST 20 01/27/2015 1017   ALT 27 11/03/2015 1452   ALT 13 01/27/2015 1017   BILITOT 0.3 11/03/2015 1452   BILITOT <0.30 01/27/2015 1017         Impression and  Plan: Ariel Holmes is 76 year old white female with a history of a lentigo maligna melanoma left thigh. She underwent surgery for this. This was back in November of 2012.  Again, she is doing well.We will now let her go from the practice. It has been 5 years. I think with her type of melanoma, the risk of recurrence would be very low.   Ariel Napoleon, MD 12/11/201711:29 AM

## 2016-01-27 ENCOUNTER — Other Ambulatory Visit: Payer: Self-pay | Admitting: Pulmonary Disease

## 2016-01-27 ENCOUNTER — Telehealth: Payer: Self-pay | Admitting: *Deleted

## 2016-01-27 LAB — VITAMIN D 25 HYDROXY (VIT D DEFICIENCY, FRACTURES): Vitamin D, 25-Hydroxy: 56.4 ng/mL (ref 30.0–100.0)

## 2016-01-27 NOTE — Telephone Encounter (Addendum)
Message left on personal voice mail.   ----- Message from [name], MD sent at 01/27/2016  6:49 AM EST ----- Cal;l- vit D level is perfect!!!  pete

## 2016-01-28 ENCOUNTER — Encounter: Payer: Self-pay | Admitting: Obstetrics and Gynecology

## 2016-02-04 ENCOUNTER — Encounter: Payer: Self-pay | Admitting: Obstetrics and Gynecology

## 2016-02-04 ENCOUNTER — Ambulatory Visit (INDEPENDENT_AMBULATORY_CARE_PROVIDER_SITE_OTHER): Payer: Medicare Other | Admitting: Obstetrics and Gynecology

## 2016-02-04 VITALS — BP 122/70 | HR 64 | Resp 16 | Ht 62.0 in | Wt 116.2 lb

## 2016-02-04 DIAGNOSIS — N812 Incomplete uterovaginal prolapse: Secondary | ICD-10-CM

## 2016-02-04 NOTE — Progress Notes (Signed)
GYNECOLOGY  VISIT   HPI: 75 y.o.   Widowed  Caucasian  female   G1P1001 with Patient's last menstrual period was 02/16/1988 (approximate).   here for   6 week pessary check.  Pessary doing well.  No itching.  A little bit of discharge.  No bleeding.  No dysuria.     GYNECOLOGIC HISTORY: Patient's last menstrual period was 02/16/1988 (approximate). Contraception:  Postmenopausal Menopausal hormone therapy:  Premarin Cream Last mammogram:  01/01/16 BIRADS 2 Benign Last pap smear:   2012 normal        OB History    Gravida Para Term Preterm AB Living   1 1 1     1    SAB TAB Ectopic Multiple Live Births                     Patient Active Problem List   Diagnosis Date Noted  . Acute upper respiratory infection 12/01/2015  . Right carotid bruit 08/01/2015  . Uterine prolapse 06/13/2013  . Cystocele 06/13/2013  . Ex-cigarette smoker 05/04/2013  . Asthmatic bronchitis 05/04/2013  . Melanoma of thigh ([place]) 02/15/2011  . ESOPHAGEAL REFLUX 03/24/2010  . 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 HYPERCHOLESTEROLEMIA, BORDERLINE, WITH HIGH HDL 01/18/2008  . Disorder of bone and cartilage 01/18/2008  . COLONIC POLYPS 01/17/2008  . Hypothyroidism 01/17/2008  . Anxiety state 01/17/2008  . MITRAL VALVE PROLAPSE, HX OF 01/17/2008    Past Medical History:  Diagnosis Date  . COPD (chronic obstructive pulmonary disease) ([place])   . Hx of colonic polyps   . Hypercholesterolemia   . Hypothyroidism   . Melanoma ([place])    lt upper thigh  . Melanoma of thigh ([place]) 02/15/2011  . Melanoma of thigh ([place]) 2014   Left   . MVP (mitral valve prolapse)   . Osteopenia   . Prolapsed uterus   . Varicose vein     Past Surgical History:  Procedure Laterality Date  . 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 Patient does not remember exact date of surgery.  . [place]  . COLONOSCOPY    . MELANOMA EXCISION  12/31/2010   Procedure: MELANOMA EXCISION;  Surgeon: Zenovia Jarred, MD;  Location: [facility];  Service: General;  Laterality: Left;  wide excision melanoma left thigh, excision lesion left thigh  . OTHER SURGICAL HISTORY Right    Surgical repair of architectual distortion Right Breast- Hyperplasia  . TONSILLECTOMY      Current Outpatient Prescriptions  Medication Sig Dispense Refill  . acyclovir ointment (ZOVIRAX) 5 % See admin instructions. Prn cold sores  0  . aspirin 81 MG tablet Take 81 mg by mouth daily.      . Calcium Carbonate-Vitamin D (CALCIUM + D PO) Take by mouth daily.      . Cholecalciferol (VITAMIN D) 2000 UNITS CAPS Take 1 capsule by mouth daily.    . cimetidine (TAGAMET) 400 MG tablet Take 400 mg by mouth as needed.    . conjugated estrogens (PREMARIN) vaginal cream Use 1/2 g vaginally with each pessary cleaning. 30 g 1  . fluticasone (FLONASE) 50 MCG/ACT nasal spray PLACE 2 SPRAYS INTO BOTH NOSTRILS AS NEEDED. 16 g 5  . levothyroxine (SYNTHROID) 75 MCG tablet Take 1 tablet (75 mcg total) by mouth daily before breakfast. 30 tablet 11  . LUTEIN PO Take 1 tablet by mouth daily.    . Multiple Vitamin (MULTIVITAMIN) tablet Take 1 tablet  by mouth daily.      . nicotine polacrilex (NICORETTE) 2 MG gum Take 2 mg by mouth as needed. Not smoking     No current facility-administered medications for this visit.      ALLERGIES: Codeine  Family History  Problem Relation Age of Onset  . Hypertension Mother   . Congestive Heart Failure Mother   . Thyroid disease Mother   . Stroke Father   . Heart attack Father 40  . Diabetes Paternal Grandmother   . Diabetes Paternal Grandfather   . Hypertension Maternal Grandfather   . CVA Maternal Grandfather   . Esophageal cancer Neg Hx   . Rectal cancer Neg Hx   . Stomach cancer Neg Hx   . Colon cancer Neg Hx     Social History   Social History  . Marital status: Widowed    Spouse name: fred Asche  . Number of children: N/A  . Years of education: N/A   Occupational  History  . Not on file.   Social History Main Topics  . Smoking status: Former Smoker    Packs/day: 1.00    Years: 50.00    Types: Cigarettes    Start date: 03/08/1960    Quit date: 07/23/2010  . Smokeless tobacco: Never Used     Comment: quit smoking 3 years ago  . Alcohol use No  . Drug use: No  . Sexual activity: No   Other Topics Concern  . Not on file   Social History Narrative  . No narrative on file    ROS:  Pertinent items are noted in HPI.  PHYSICAL EXAMINATION:    BP 122/70 (BP Location: Right Arm, Patient Position: Sitting, Cuff Size: Normal)   Pulse 64   Resp 16   Ht 5\' 2"  (1.575 m)   Wt 116 lb 3.2 oz (52.7 kg)   LMP 02/16/1988 (Approximate)   BMI 21.25 kg/m     General appearance: alert, cooperative and appears stated age    Pelvic: External genitalia:  no lesions              Urethra:  normal appearing urethra with no masses, tenderness or lesions              Bartholins and Skenes: normal                 Vagina: normal appearing vagina with normal color and discharge, no lesions.  Minor redness of posterior vaginal wall at apex.               Cervix: no lesions                Bimanual Exam:  Uterus:  normal size, contour, position, consistency, mobility, non-tender              Adnexa: no mass, fullness, tenderness             Pessary removed, cleansed, and replaced. Premarin 1 gram used.  Chaperone was present for exam.  ASSESSMENT  Incomplete uterovaginal prolapse.  Minor erythema of posterior vaginal wall.  PLAN  Continue pessary use.  Follow up in 6 weeks.    An After Visit Summary was printed and given to the patient.  _15_____ minutes face to face time of which over 50% was spent in counseling.

## 2016-02-06 ENCOUNTER — Ambulatory Visit: Payer: Medicare Other | Admitting: Obstetrics and Gynecology

## 2016-03-05 ENCOUNTER — Other Ambulatory Visit: Payer: Self-pay | Admitting: Pulmonary Disease

## 2016-03-17 ENCOUNTER — Encounter: Payer: Self-pay | Admitting: Obstetrics and Gynecology

## 2016-03-17 ENCOUNTER — Ambulatory Visit (INDEPENDENT_AMBULATORY_CARE_PROVIDER_SITE_OTHER): Payer: Medicare Other | Admitting: Obstetrics and Gynecology

## 2016-03-17 VITALS — BP 150/80 | HR 60 | Ht 62.0 in | Wt 117.2 lb

## 2016-03-17 DIAGNOSIS — T8369XA Infection and inflammatory reaction due to other prosthetic device, implant and graft in genital tract, initial encounter: Secondary | ICD-10-CM

## 2016-03-17 DIAGNOSIS — N760 Acute vaginitis: Secondary | ICD-10-CM | POA: Diagnosis not present

## 2016-03-17 DIAGNOSIS — N812 Incomplete uterovaginal prolapse: Secondary | ICD-10-CM | POA: Diagnosis not present

## 2016-03-17 NOTE — Progress Notes (Signed)
GYNECOLOGY  VISIT   HPI: 76 y.o.   Widowed  Caucasian  female   G1P1001 with Patient's last menstrual period was 02/16/1988 (approximate).   here for pessary follow up.   Was last seen 02/04/16.  Patient complaining of yellow vaginal discharge with slight odor. Yellow discharge.   No pain.  No bleeding.   Voiding well and having normal BMs.  Has Rx for Premarin cream and uses sometimes.   GYNECOLOGIC HISTORY: Patient's last menstrual period was 02/16/1988 (approximate). Contraception:  Postmenopausal Menopausal hormone therapy:  Premarin Cream Last mammogram:  01/01/16 BIRADS 2 Benign Last pap smear:   2012 normal        OB History    Gravida Para Term Preterm AB Living   1 1 1     1    SAB TAB Ectopic Multiple Live Births                     Patient Active Problem List   Diagnosis Date Noted  . Acute upper respiratory infection 12/01/2015  . Right carotid bruit 08/01/2015  . Uterine prolapse 06/13/2013  . Cystocele 06/13/2013  . Ex-cigarette smoker 05/04/2013  . Asthmatic bronchitis 05/04/2013  . Melanoma of thigh ([place]) 02/15/2011  . ESOPHAGEAL REFLUX 03/24/2010  . 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 HYPERCHOLESTEROLEMIA, BORDERLINE, WITH HIGH HDL 01/18/2008  . Disorder of bone and cartilage 01/18/2008  . COLONIC POLYPS 01/17/2008  . Hypothyroidism 01/17/2008  . Anxiety state 01/17/2008  . MITRAL VALVE PROLAPSE, HX OF 01/17/2008    Past Medical History:  Diagnosis Date  . COPD (chronic obstructive pulmonary disease) ([place])   . Hx of colonic polyps   . Hypercholesterolemia   . Hypothyroidism   . Melanoma ([place])    lt upper thigh  . Melanoma of thigh ([place]) 02/15/2011  . Melanoma of thigh ([place]) 2014   Left   . MVP (mitral valve prolapse)   . Osteopenia   . Prolapsed uterus   . Varicose vein     Past Surgical History:  Procedure Laterality Date  . 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 Patient does not remember exact date of surgery.   . [place]  . COLONOSCOPY    . MELANOMA EXCISION  12/31/2010   Procedure: MELANOMA EXCISION;  Surgeon: Zenovia Jarred, MD;  Location: [facility];  Service: General;  Laterality: Left;  wide excision melanoma left thigh, excision lesion left thigh  . OTHER SURGICAL HISTORY Right    Surgical repair of architectual distortion Right Breast- Hyperplasia  . TONSILLECTOMY      Current Outpatient Prescriptions  Medication Sig Dispense Refill  . acyclovir ointment (ZOVIRAX) 5 % See admin instructions. Prn cold sores  0  . aspirin 81 MG tablet Take 81 mg by mouth daily.      . Calcium Carbonate-Vitamin D (CALCIUM + D PO) Take by mouth daily.      . Cholecalciferol (VITAMIN D) 2000 UNITS CAPS Take 1 capsule by mouth daily.    . cimetidine (TAGAMET) 400 MG tablet Take 400 mg by mouth as needed.    . conjugated estrogens (PREMARIN) vaginal cream Use 1/2 g vaginally with each pessary cleaning. 30 g 1  . fluticasone (FLONASE) 50 MCG/ACT nasal spray PLACE 2 SPRAYS INTO BOTH NOSTRILS AS NEEDED. 16 g 5  . levothyroxine (SYNTHROID) 75 MCG tablet Take 1 tablet (75 mcg total) by mouth daily before breakfast. 30 tablet 11  . LUTEIN  PO Take 1 tablet by mouth daily.    . Multiple Vitamin (MULTIVITAMIN) tablet Take 1 tablet by mouth daily.      . nicotine polacrilex (NICORETTE) 2 MG gum Take 2 mg by mouth as needed. Not smoking     No current facility-administered medications for this visit.      ALLERGIES: Codeine  Family History  Problem Relation Age of Onset  . Hypertension Mother   . Congestive Heart Failure Mother   . Thyroid disease Mother   . Stroke Father   . Heart attack Father 42  . Diabetes Paternal Grandmother   . Diabetes Paternal Grandfather   . Hypertension Maternal Grandfather   . CVA Maternal Grandfather   . Esophageal cancer Neg Hx   . Rectal cancer Neg Hx   . Stomach cancer Neg Hx   . Colon cancer Neg Hx     Social History   Social History  .  Marital status: Widowed    Spouse name: Ariel Holmes  . Number of children: N/A  . Years of education: N/A   Occupational History  . Not on file.   Social History Main Topics  . Smoking status: Former Smoker    Packs/day: 1.00    Years: 50.00    Types: Cigarettes    Start date: 03/08/1960    Quit date: 07/23/2010  . Smokeless tobacco: Never Used     Comment: quit smoking 3 years ago  . Alcohol use No  . Drug use: No  . Sexual activity: No   Other Topics Concern  . Not on file   Social History Narrative  . No narrative on file    ROS:  Pertinent items are noted in HPI.  PHYSICAL EXAMINATION:    BP (!) 150/80   Pulse 60   Ht 5\' 2"  (1.575 m)   Wt 117 lb 3.2 oz (53.2 kg)   LMP 02/16/1988 (Approximate)   BMI 21.44 kg/m     General appearance: alert, cooperative and appears stated age    Pelvic: External genitalia:  no lesions              Urethra:  normal appearing urethra with no masses, tenderness or lesions              Bartholins and Skenes: normal                 Vagina: normal appearing vagina with normal color and discharge, no lesions.  Inflammation of the posterior vaginal wall - 2 patches of 1.5 cm each.               Cervix: no lesions                Bimanual Exam:  Uterus:  normal size, contour, position, consistency, mobility, non-tender              Adnexa: no mass, fullness, tenderness               Pessary removed, cleansed, and given to patient in a bio bag. Premarin vaginal cream 1 gram placed.   Chaperone was present for exam.  ASSESSMENT  Incomplete uterovaginal prolapse.  Vaginal inflammation from pessary use. Hx hyperplasia of the right breast.   PLAN  Will keep pessary out for 2 weeks.  Use Premarin cream 1/2 gram every other night for 2 weeks.  Return for recheck in 2 weeks.   Will hopefully be able to place pessary back then.  An After Visit  Summary was printed and given to the patient.  ___15___ minutes face to face time of which  over 50% was spent in counseling.

## 2016-03-31 ENCOUNTER — Encounter: Payer: Self-pay | Admitting: Obstetrics and Gynecology

## 2016-03-31 ENCOUNTER — Ambulatory Visit (INDEPENDENT_AMBULATORY_CARE_PROVIDER_SITE_OTHER): Payer: Medicare Other | Admitting: Obstetrics and Gynecology

## 2016-03-31 VITALS — BP 138/70 | HR 80 | Resp 16 | Ht 62.0 in | Wt 117.0 lb

## 2016-03-31 DIAGNOSIS — R42 Dizziness and giddiness: Secondary | ICD-10-CM

## 2016-03-31 DIAGNOSIS — N812 Incomplete uterovaginal prolapse: Secondary | ICD-10-CM

## 2016-03-31 NOTE — Patient Instructions (Signed)
Benign Positional Vertigo Introduction Vertigo is the feeling that you or your surroundings are moving when they are not. Benign positional vertigo is the most common form of vertigo. The cause of this condition is not serious (is benign). This condition is triggered by certain movements and positions (is positional). This condition can be dangerous if it occurs while you are doing something that could endanger you or others, such as driving. What are the causes? In many cases, the cause of this condition is not known. It may be caused by a disturbance in an area of the inner ear that helps your brain to sense movement and balance. This disturbance can be caused by a viral infection (labyrinthitis), head injury, or repetitive motion. What increases the risk? This condition is more likely to develop in:  Women.  People who are 50 years of age or older. What are the signs or symptoms? Symptoms of this condition usually happen when you move your head or your eyes in different directions. Symptoms may start suddenly, and they usually last for less than a minute. Symptoms may include:  Loss of balance and falling.  Feeling like you are spinning or moving.  Feeling like your surroundings are spinning or moving.  Nausea and vomiting.  Blurred vision.  Dizziness.  Involuntary eye movement (nystagmus). Symptoms can be mild and cause only slight annoyance, or they can be severe and interfere with daily life. Episodes of benign positional vertigo may return (recur) over time, and they may be triggered by certain movements. Symptoms may improve over time. How is this diagnosed? This condition is usually diagnosed by medical history and a physical exam of the head, neck, and ears. You may be referred to a health care provider who specializes in ear, nose, and throat (ENT) problems (otolaryngologist) or a provider who specializes in disorders of the nervous system (neurologist). You may have  additional testing, including:  MRI.  A CT scan.  Eye movement tests. Your health care provider may ask you to change positions quickly while he or she watches you for symptoms of benign positional vertigo, such as nystagmus. Eye movement may be tested with an electronystagmogram (ENG), caloric stimulation, the Dix-Hallpike test, or the roll test.  An electroencephalogram (EEG). This records electrical activity in your brain.  Hearing tests. How is this treated? Usually, your health care provider will treat this by moving your head in specific positions to adjust your inner ear back to normal. Surgery may be needed in severe cases, but this is rare. In some cases, benign positional vertigo may resolve on its own in 2-4 weeks. Follow these instructions at home: Safety  Move slowly.Avoid sudden body or head movements.  Avoid driving.  Avoid operating heavy machinery.  Avoid doing any tasks that would be dangerous to you or others if a vertigo episode would occur.  If you have trouble walking or keeping your balance, try using a cane for stability. If you feel dizzy or unstable, sit down right away.  Return to your normal activities as told by your health care provider. Ask your health care provider what activities are safe for you. General instructions  Take over-the-counter and prescription medicines only as told by your health care provider.  Avoid certain positions or movements as told by your health care provider.  Drink enough fluid to keep your urine clear or pale yellow.  Keep all follow-up visits as told by your health care provider. This is important. Contact a health care provider if:    You have a fever.  Your condition gets worse or you develop new symptoms.  Your family or friends notice any behavioral changes.  Your nausea or vomiting gets worse.  You have numbness or a "pins and needles" sensation. Get help right away if:  You have difficulty speaking or  moving.  You are always dizzy.  You faint.  You develop severe headaches.  You have weakness in your legs or arms.  You have changes in your hearing or vision.  You develop a stiff neck.  You develop sensitivity to light. This information is not intended to replace advice given to you by your health care provider. Make sure you discuss any questions you have with your health care provider. Document Released: 11/09/2005 Document Revised: 07/10/2015 Document Reviewed: 05/27/2014  2017 Elsevier  

## 2016-03-31 NOTE — Progress Notes (Signed)
GYNECOLOGY  VISIT   HPI: 76 y.o.   Widowed  Caucasian  female   [id number] with Patient's last menstrual period was 02/16/1988 (approximate).   here for 2 week recheck.  Left pessary out for 2 weeks due to inflammation noted at visit on 03/18/15. Used Premarin cream more frequently during last 2 weeks but it was not every other night.   Bladder has not fallen since taking it out.   Having dizzy spells for one week or so.  Seems to be worse with position changes.  PCP - Dr. Lenna Gilford.  GYNECOLOGIC HISTORY: Patient's last menstrual period was 02/16/1988 (approximate). Contraception:  Postmenopausal Menopausal hormone therapy:  Premarin Cream Last mammogram:  01/01/16 BIRADS 2 Benign Last pap smear:   2012 normal        OB History    Gravida Para Term Preterm AB Living   1 1 1     1    SAB TAB Ectopic Multiple Live Births                     Patient Active Problem List   Diagnosis Date Noted  . Acute upper respiratory infection 12/01/2015  . Right carotid bruit 08/01/2015  . Uterine prolapse 06/13/2013  . Cystocele 06/13/2013  . Ex-cigarette smoker 05/04/2013  . Asthmatic bronchitis 05/04/2013  . 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 ESOPHAGEAL REFLUX 03/24/2010  . 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 HYPERCHOLESTEROLEMIA, BORDERLINE, WITH HIGH HDL 01/18/2008  . Disorder of bone and cartilage 01/18/2008  . COLONIC POLYPS 01/17/2008  . Hypothyroidism 01/17/2008  . Anxiety state 01/17/2008  . MITRAL VALVE PROLAPSE, HX OF 01/17/2008    Past Medical History:  Diagnosis Date  . COPD (chronic obstructive pulmonary disease) ([place])   . Hx of colonic polyps   . Hypercholesterolemia   . Hypothyroidism   . Melanoma ([place])    lt upper thigh  . 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 Melanoma of thigh ([place]) 2014   Left   . MVP (mitral valve prolapse)   . Osteopenia   . Prolapsed uterus   . Varicose vein     Past Surgical History:  Procedure Laterality Date  . BREAST SURGERY     pre  cancerous removed from right breast. Patient does not remember exact date of surgery.  . [place]  . COLONOSCOPY    . MELANOMA EXCISION  12/31/2010   Procedure: MELANOMA EXCISION;  Surgeon: Zenovia Jarred, MD;  Location: [facility];  Service: General;  Laterality: Left;  wide excision melanoma left thigh, excision lesion left thigh  . OTHER SURGICAL HISTORY Right    Surgical repair of architectual distortion Right Breast- Hyperplasia  . TONSILLECTOMY      Current Outpatient Prescriptions  Medication Sig Dispense Refill  . acyclovir ointment (ZOVIRAX) 5 % See admin instructions. Prn cold sores  0  . aspirin 81 MG tablet Take 81 mg by mouth daily.      . Calcium Carbonate-Vitamin D (CALCIUM + D PO) Take by mouth daily.      . Cholecalciferol (VITAMIN D) 2000 UNITS CAPS Take 1 capsule by mouth daily.    . cimetidine (TAGAMET) 400 MG tablet Take 400 mg by mouth as needed.    . conjugated estrogens (PREMARIN) vaginal cream Use 1/2 g vaginally with each pessary cleaning. 30 g 1  . fluticasone (FLONASE) 50 MCG/ACT nasal spray PLACE 2 SPRAYS INTO BOTH NOSTRILS AS NEEDED. 16 g 5  . levothyroxine (  SYNTHROID) 75 MCG tablet Take 1 tablet (75 mcg total) by mouth daily before breakfast. 30 tablet 11  . LUTEIN PO Take 1 tablet by mouth daily.    . Multiple Vitamin (MULTIVITAMIN) tablet Take 1 tablet by mouth daily.      . nicotine polacrilex (NICORETTE) 2 MG gum Take 2 mg by mouth as needed. Not smoking     No current facility-administered medications for this visit.      ALLERGIES: Codeine  Family History  Problem Relation Age of Onset  . Hypertension Mother   . Congestive Heart Failure Mother   . Thyroid disease Mother   . Stroke Father   . Heart attack Father 20  . Diabetes Paternal Grandmother   . Diabetes Paternal Grandfather   . Hypertension Maternal Grandfather   . CVA Maternal Grandfather   . Esophageal cancer Neg Hx   . Rectal cancer Neg Hx   .  Stomach cancer Neg Hx   . Colon cancer Neg Hx     Social History   Social History  . Marital status: Widowed    Spouse name: fred Rubin  . Number of children: N/A  . Years of education: N/A   Occupational History  . Not on file.   Social History Main Topics  . Smoking status: Former Smoker    Packs/day: 1.00    Years: 50.00    Types: Cigarettes    Start date: 03/08/1960    Quit date: 07/23/2010  . Smokeless tobacco: Never Used     Comment: quit smoking 3 years ago  . Alcohol use No  . Drug use: No  . Sexual activity: No   Other Topics Concern  . Not on file   Social History Narrative  . No narrative on file    ROS:  Pertinent items are noted in HPI.  PHYSICAL EXAMINATION:    Pulse 80   Wt 117 lb (53.1 kg)   LMP 02/16/1988 (Approximate)   BMI 21.40 kg/m     General appearance: alert, cooperative and appears stated age   Pelvic: External genitalia:  no lesions              Urethra:  normal appearing urethra with no masses, tenderness or lesions              Bartholins and Skenes: normal                 Vagina: normal appearing vagina with normal color and discharge, no lesions.  Minimal erythema, 1 cm of posterior right vaginal cuff.               Cervix: no lesions                Pessary right with support placed in the vaginal with Premarin cream 1 gram.   Bimanual Exam:  Uterus:  normal size, contour, position, consistency, mobility, non-tender              Adnexa: no mass, fullness, tenderness          Chaperone was present for exam.  ASSESSMENT  Incomplete uterovaginal prolapse.  Vaginal inflammation essentially gone.  Dizziness.  May be benign positional vertigo.  PLAN  Continue pessary use.  Return in one month for a recheck.  Discussed vertigo with patient.  I mentioned the work up for this.  We talked about Meclizine  Follow up with PCP.    An After Visit Summary was printed and given to the patient.  ___15___ minutes face to face time  of which over 50% was spent in counseling.

## 2016-04-01 ENCOUNTER — Ambulatory Visit: Payer: Medicare Other | Admitting: Obstetrics and Gynecology

## 2016-04-02 ENCOUNTER — Ambulatory Visit (INDEPENDENT_AMBULATORY_CARE_PROVIDER_SITE_OTHER): Payer: Medicare Other | Admitting: Adult Health

## 2016-04-02 ENCOUNTER — Encounter: Payer: Self-pay | Admitting: Adult Health

## 2016-04-02 ENCOUNTER — Other Ambulatory Visit (INDEPENDENT_AMBULATORY_CARE_PROVIDER_SITE_OTHER): Payer: Medicare Other

## 2016-04-02 DIAGNOSIS — R42 Dizziness and giddiness: Secondary | ICD-10-CM | POA: Diagnosis not present

## 2016-04-02 LAB — CBC WITH DIFFERENTIAL/PLATELET
Basophils Absolute: 0.1 10*3/uL (ref 0.0–0.1)
Basophils Relative: 0.9 % (ref 0.0–3.0)
EOS ABS: 0.1 10*3/uL (ref 0.0–0.7)
Eosinophils Relative: 1 % (ref 0.0–5.0)
HCT: 35.4 % — ABNORMAL LOW (ref 36.0–46.0)
Hemoglobin: 11.6 g/dL — ABNORMAL LOW (ref 12.0–15.0)
LYMPHS ABS: 5.5 10*3/uL — AB (ref 0.7–4.0)
MCHC: 32.8 g/dL (ref 30.0–36.0)
MCV: 96.5 fl (ref 78.0–100.0)
MONOS PCT: 8.8 % (ref 3.0–12.0)
Monocytes Absolute: 0.9 10*3/uL (ref 0.1–1.0)
NEUTROS ABS: 3.4 10*3/uL (ref 1.4–7.7)
NEUTROS PCT: 33.9 % — AB (ref 43.0–77.0)
Platelets: 188 10*3/uL (ref 150.0–400.0)
RBC: 3.67 Mil/uL — AB (ref 3.87–5.11)
RDW: 15 % (ref 11.5–15.5)
WBC: 9.9 10*3/uL (ref 4.0–10.5)

## 2016-04-02 LAB — BASIC METABOLIC PANEL
BUN: 22 mg/dL (ref 6–23)
CHLORIDE: 105 meq/L (ref 96–112)
CO2: 29 mEq/L (ref 19–32)
Calcium: 9.6 mg/dL (ref 8.4–10.5)
Creatinine, Ser: 0.73 mg/dL (ref 0.40–1.20)
GFR: 82.48 mL/min (ref >60.00)
Glucose, Bld: 92 mg/dL (ref 70–99)
Potassium: 4.1 mEq/L (ref 3.5–5.1)
SODIUM: 142 meq/L (ref 135–145)

## 2016-04-02 MED ORDER — MECLIZINE HCL 25 MG PO TABS: 12.5000 mg | ORAL_TABLET | Freq: Two times a day (BID) | ORAL | 0 refills | Status: DC | PRN

## 2016-04-02 NOTE — Patient Instructions (Addendum)
Change positions slowly .  Push fluids  Labs today  Meclizine 25mg  1/2 Twice daily  As needed  Dizziness.  Follow up Dr. Lenna Gilford  In 2 months and As needed   Please contact office for sooner follow up if symptoms do not improve or worsen or seek emergency care

## 2016-04-02 NOTE — Progress Notes (Signed)
@Patient  ID: Ariel Holmes, female    DOB: Oct 31, 1940, 76 y.o.   MRN: [id number]  Chief Complaint  Patient presents with  . Acute Visit    dizzy     Referring provider: Noralee Space, MD  HPI: 76 yo female followed for hypothyroidism   04/02/2016 Acute OV  She presents for an acute office visit. Complains of 1 week of dizziness.  Feels dizzy. Worse with movement, bending , quick movement .  Started in bed when she turned over in bed.   Has been getting better over last week. Very little symptoms .  No chest pain , loss of vision, ext weakness, no speech issues, palpitations, or syncope.   Allergies  Allergen Reactions  . Codeine Nausea And Vomiting    Immunization History  Administered Date(s) Administered  . H1N1 01/18/2008  . Influenza Split 11/16/2010, 11/29/2011  . Influenza Whole 10/28/2009  . Influenza, High Dose Seasonal PF 12/16/2015  . Influenza,inj,Quad PF,36+ Mos 11/14/2012, 11/28/2013, 11/29/2014  . Influenza-Unspecified 12/04/2014  . Pneumococcal Conjugate-13 06/12/2015  . Pneumococcal Polysaccharide-23 03/11/2010  . Td 03/11/2010  . Zoster 03/01/2007    Past Medical History:  Diagnosis Date  . COPD (chronic obstructive pulmonary disease) ([place])   . Hx of colonic polyps   . Hypercholesterolemia   . Hypothyroidism   . Melanoma ([place])    lt upper thigh  . 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 MVP (mitral valve prolapse)   . Osteopenia   . Prolapsed uterus   . Varicose vein     Tobacco History: History  Smoking Status  . Former Smoker  . Packs/day: 1.00  . Years: 50.00  . Types: Cigarettes  . Start date: 03/08/1960  . Quit date: 07/23/2010  Smokeless Tobacco  . Never Used    Comment: quit smoking 3 years ago   Counseling given: Not Answered   Outpatient Encounter Prescriptions as of 04/02/2016  Medication Sig  . acyclovir ointment (ZOVIRAX) 5 % See admin instructions. Prn cold sores  . aspirin 81  MG tablet Take 81 mg by mouth daily.    . Calcium Carbonate-Vitamin D (CALCIUM + D PO) Take by mouth daily.    . Cholecalciferol (VITAMIN D) 2000 UNITS CAPS Take 1 capsule by mouth daily.  . cimetidine (TAGAMET) 400 MG tablet Take 400 mg by mouth as needed.  . conjugated estrogens (PREMARIN) vaginal cream Use 1/2 g vaginally with each pessary cleaning.  . fluticasone (FLONASE) 50 MCG/ACT nasal spray PLACE 2 SPRAYS INTO BOTH NOSTRILS AS NEEDED.  [name] levothyroxine (SYNTHROID) 75 MCG tablet Take 1 tablet (75 mcg total) by mouth daily before breakfast.  . LUTEIN PO Take 1 tablet by mouth daily.  . Multiple Vitamin (MULTIVITAMIN) tablet Take 1 tablet by mouth daily.    . nicotine polacrilex (NICORETTE) 2 MG gum Take 2 mg by mouth as needed. Not smoking  . meclizine (MEDI-MECLIZINE) 25 MG tablet Take 0.5 tablets (12.5 mg total) by mouth 2 (two) times daily as needed for dizziness.   No facility-administered encounter medications on file as of 04/02/2016.      Review of Systems  Constitutional:   No  weight loss, night sweats,  Fevers, chills, fatigue, or  lassitude.  HEENT:   No headaches,  Difficulty swallowing,  Tooth/dental problems, or  Sore throat,                No sneezing, itching, ear ache,  nasal congestion, post nasal drip,   CV:  No chest pain,  Orthopnea, PND, swelling in lower extremities, anasarca, dizziness, palpitations, syncope.   GI  No heartburn, indigestion, abdominal pain, nausea, vomiting, diarrhea, change in bowel habits, loss of appetite, bloody stools.   Resp: No shortness of breath with exertion or at rest.  No excess mucus, no productive cough,  No non-productive cough,  No coughing up of blood.  No change in color of mucus.  No wheezing.  No chest wall deformity  Skin: no rash or lesions.  GU: no dysuria, change in color of urine, no urgency or frequency.  No flank pain, no hematuria   MS:  No joint pain or swelling.  No decreased range of motion.  No back  pain.    Physical Exam  BP 112/74 (BP Location: Left Arm, Cuff Size: Normal)   Pulse 73   Ht 5\' 2"  (1.575 m)   Wt 117 lb 12.8 oz (53.4 kg)   LMP 02/16/1988 (Approximate)   SpO2 99%   BMI 21.55 kg/m   GEN: A/Ox3; pleasant , NAD, well nourished    HEENT:  [place]/AT,  EACs-clear, TMs-wnl, NOSE-clear, THROAT-clear, no lesions, no postnasal drip or exudate noted.   NECK:  Supple w/ fair ROM; no JVD; normal carotid impulses w/o bruits; no thyromegaly or nodules palpated; no lymphadenopathy.    RESP  Clear  P & A; w/o, wheezes/ rales/ or rhonchi. no accessory muscle use, no dullness to percussion  CARD:  RRR, no m/r/g, no peripheral edema, pulses intact, no cyanosis or clubbing.  GI:   Soft & nt; nml bowel sounds; no organomegaly or masses detected.   Musco: Warm bil, no deformities or joint swelling noted.   Neuro: alert, no focal deficits noted.    Skin: Warm, no lesions or rashes  Neuro : CN 2-12 intact, neg pronator drift, PERRLA , nml grips, maew x 4, nml gait.   Lab Results:  BNP No results found for: BNP  ProBNP No results found for: PROBNP  Imaging: No results found.   Assessment & Plan:   Vertigo Suspected Vertigo - exam is unrevealing. Sx seems to be improving on their on own.  Check labs today   Plan  Patient Instructions  Change positions slowly .  Push fluids  Labs today  Meclizine 25mg  1/2 Twice daily  As needed  Dizziness.  Follow up Dr. Lenna Gilford  In 2 months and As needed   Please contact office for sooner follow up if symptoms do not improve or worsen or seek emergency care         Rexene Edison, NP 04/02/2016

## 2016-04-02 NOTE — Assessment & Plan Note (Signed)
Suspected Vertigo - exam is unrevealing. Sx seems to be improving on their on own.  Check labs today   Plan  Patient Instructions  Change positions slowly .  Push fluids  Labs today  Meclizine 25mg  1/2 Twice daily  As needed  Dizziness.  Follow up Dr. Lenna Gilford  In 2 months and As needed   Please contact office for sooner follow up if symptoms do not improve or worsen or seek emergency care

## 2016-04-04 ENCOUNTER — Other Ambulatory Visit: Payer: Self-pay | Admitting: Pulmonary Disease

## 2016-04-05 LAB — TSH: TSH: 5.44 u[IU]/mL — ABNORMAL HIGH (ref 0.35–4.50)

## 2016-04-08 ENCOUNTER — Telehealth: Payer: Self-pay | Admitting: Pulmonary Disease

## 2016-04-08 DIAGNOSIS — D649 Anemia, unspecified: Secondary | ICD-10-CM

## 2016-04-08 NOTE — Telephone Encounter (Signed)
Pt seen by TP on 04/02/2016.Ariel Holmes'

## 2016-04-08 NOTE — Progress Notes (Signed)
lmtcb

## 2016-04-08 NOTE — Telephone Encounter (Signed)
Notes Recorded by Melvenia Needles, NP on 04/05/2016 at 4:02 PM EST Thyroid is sluggish ? Taking her meds , ? Did she miss a few doses?  Electrolytes panel is norm  Blood work shows some mild anemia ? Recent infection /illness, would repeat in 4-6 weeks with CBC .  Make sure she is feeling better from dizziness .  Please contact office for sooner follow up if symptoms do not improve or worsen or seek emergency care  ------ Results have been explained to patient, pt expressed understanding.  Pt states that she has not missed any doses of her Thyroid meds.  Pt states that her dose was dropped back from 127mcg to 43mcg by Dr Lenna Gilford around April 2017 -- Pt has been on 67mcg for about 1.5 years. Pt asking if given her results if she needs to go back on the 127mcg dose?  Pt denies any recent illness other than when she was sick back in October 2017.  Patient states that she is not having much dizziness any more.  (documented in lab result note as well)   Please advise TP. Thanks.

## 2016-04-08 NOTE — Telephone Encounter (Signed)
Ok, it is not quite where we want it but just a little above normal so I would change her synthroid to 22mcg #30 , 5 refills. Check TSH in  4-6 weeks . She can get it checked when she has her repeat CBC .   Is her dizziness better ?

## 2016-04-09 MED ORDER — LEVOTHYROXINE SODIUM 88 MCG PO TABS: 88.0000 ug | ORAL_TABLET | Freq: Every day | ORAL | 5 refills | Status: DC

## 2016-04-09 NOTE — Telephone Encounter (Signed)
TP   Spoke with pt and made her aware of your message and recc. Pt expressed her dizziness has been better, still gets it but it is very slight. I have sent in her new rx for her synthroid. Pt had no further questions.

## 2016-04-09 NOTE — Telephone Encounter (Signed)
[email or web address] x1 

## 2016-04-09 NOTE — Telephone Encounter (Signed)
[phone] pt calling back

## 2016-04-26 NOTE — Progress Notes (Signed)
GYNECOLOGY  VISIT   HPI: 76 y.o.   Widowed  Caucasian  female   G1P1001 with Patient's last menstrual period was 02/16/1988 (approximate).   here for pessary follow up.    Forgot to use her estrogen cream.  No pain or bleeding.  Good bladder and bowel function.   Went to PCP for vertigo and was treated with Meclizine.  Is having thyroid medication adjustment.  Had hgb 11.6.  WBC normal but had low neutrophils.  Will recheck labs again 05/10/16.   GYNECOLOGIC HISTORY: Patient's last menstrual period was 02/16/1988 (approximate). Contraception:  Postmenopausal Menopausal hormone therapy:  Premarin vaginal cream Last mammogram: 01/01/16 BIRADS 2 Benign  Last pap smear: 2012 normal          OB History    Gravida Para Term Preterm AB Living   1 1 1     1    SAB TAB Ectopic Multiple Live Births                     Patient Active Problem List   Diagnosis Date Noted  . Vertigo 04/02/2016  . Acute upper respiratory infection 12/01/2015  . Right carotid bruit 08/01/2015  . Uterine prolapse 06/13/2013  . Cystocele 06/13/2013  . Ex-cigarette smoker 05/04/2013  . Asthmatic bronchitis 05/04/2013  . 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 ESOPHAGEAL REFLUX 03/24/2010  . 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 HYPERCHOLESTEROLEMIA, BORDERLINE, WITH HIGH HDL 01/18/2008  . Disorder of bone and cartilage 01/18/2008  . COLONIC POLYPS 01/17/2008  . Hypothyroidism 01/17/2008  . Anxiety state 01/17/2008  . MITRAL VALVE PROLAPSE, HX OF 01/17/2008    Past Medical History:  Diagnosis Date  . COPD (chronic obstructive pulmonary disease) ([place])   . Hx of colonic polyps   . Hypercholesterolemia   . Hypothyroidism   . Melanoma ([place])    lt upper thigh  . 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 Melanoma of thigh ([place]) 2014   Left   . MVP (mitral valve prolapse)   . Osteopenia   . Prolapsed uterus   . Varicose vein     Past Surgical History:  Procedure Laterality Date  . BREAST SURGERY     pre  cancerous removed from right breast. Patient does not remember exact date of surgery.  . [place]  . COLONOSCOPY    . MELANOMA EXCISION  12/31/2010   Procedure: MELANOMA EXCISION;  Surgeon: Zenovia Jarred, MD;  Location: [facility];  Service: General;  Laterality: Left;  wide excision melanoma left thigh, excision lesion left thigh  . OTHER SURGICAL HISTORY Right    Surgical repair of architectual distortion Right Breast- Hyperplasia  . TONSILLECTOMY      Current Outpatient Prescriptions  Medication Sig Dispense Refill  . acyclovir ointment (ZOVIRAX) 5 % See admin instructions. Prn cold sores  0  . aspirin 81 MG tablet Take 81 mg by mouth daily.      . Calcium Carbonate-Vitamin D (CALCIUM + D PO) Take by mouth daily.      . Cholecalciferol (VITAMIN D) 2000 UNITS CAPS Take 1 capsule by mouth daily.    . cimetidine (TAGAMET) 400 MG tablet Take 400 mg by mouth as needed.    . cimetidine (TAGAMET) 400 MG tablet TAKE 1 TABLET TWICE A DAY 60 tablet 0  . conjugated estrogens (PREMARIN) vaginal cream Use 1/2 g vaginally with each pessary cleaning. 30 g 1  . fluticasone (FLONASE) 50 MCG/ACT nasal spray PLACE  2 SPRAYS INTO BOTH NOSTRILS AS NEEDED. 16 g 5  . levothyroxine (SYNTHROID) 88 MCG tablet Take 1 tablet (88 mcg total) by mouth daily before breakfast. 30 tablet 5  . LUTEIN PO Take 1 tablet by mouth daily.    . meclizine (MEDI-MECLIZINE) 25 MG tablet Take 0.5 tablets (12.5 mg total) by mouth 2 (two) times daily as needed for dizziness. 30 tablet 0  . Multiple Vitamin (MULTIVITAMIN) tablet Take 1 tablet by mouth daily.      . nicotine polacrilex (NICORETTE) 2 MG gum Take 2 mg by mouth as needed. Not smoking     No current facility-administered medications for this visit.      ALLERGIES: Codeine  Family History  Problem Relation Age of Onset  . Hypertension Mother   . Congestive Heart Failure Mother   . Thyroid disease Mother   . Stroke Father   . Heart  attack Father 33  . Diabetes Paternal Grandmother   . Diabetes Paternal Grandfather   . Hypertension Maternal Grandfather   . CVA Maternal Grandfather   . Esophageal cancer Neg Hx   . Rectal cancer Neg Hx   . Stomach cancer Neg Hx   . Colon cancer Neg Hx     Social History   Social History  . Marital status: Widowed    Spouse name: fred Wise  . Number of children: N/A  . Years of education: N/A   Occupational History  . Not on file.   Social History Main Topics  . Smoking status: Former Smoker    Packs/day: 1.00    Years: 50.00    Types: Cigarettes    Start date: 03/08/1960    Quit date: 07/23/2010  . Smokeless tobacco: Never Used     Comment: quit smoking 3 years ago  . Alcohol use No  . Drug use: No  . Sexual activity: No   Other Topics Concern  . Not on file   Social History Narrative  . No narrative on file    ROS:  Pertinent items are noted in HPI.  PHYSICAL EXAMINATION:    BP (!) 160/80 (BP Location: Right Arm, Patient Position: Sitting, Cuff Size: Normal)   Pulse 66   Ht 5\' 2"  (1.575 m)   Wt 116 lb 12.8 oz (53 kg)   LMP 02/16/1988 (Approximate)   BMI 21.36 kg/m     General appearance: alert, cooperative and appears stated age   Pelvic: External genitalia:  no lesions              Urethra:  normal appearing urethra with no masses, tenderness or lesions              Bartholins and Skenes: normal                 Vagina:  1 inch of erythema posterior vaginal cuff.               Cervix: no lesions                Bimanual Exam:  Uterus:  normal size, contour, position, consistency, mobility, non-tender              Adnexa: no mass, fullness, tenderness            Pessary removed, cleansed and replaced with Premarin cream 1/2 gram.   Chaperone was present for exam.  ASSESSMENT  Incomplete uterovaginal prolapse.  Posterior vaginal wall erythema.  This is controlled adequately with Premarin cream and  rechecks in the office.    PLAN  Continue  pessary care.  Continue Premarin cream.  Follow up in one month.    An After Visit Summary was printed and given to the patient.  ___15___ minutes face to face time of which over 50% was spent in counseling.

## 2016-04-28 ENCOUNTER — Encounter: Payer: Self-pay | Admitting: Obstetrics and Gynecology

## 2016-04-28 ENCOUNTER — Ambulatory Visit (INDEPENDENT_AMBULATORY_CARE_PROVIDER_SITE_OTHER): Payer: Medicare Other | Admitting: Obstetrics and Gynecology

## 2016-04-28 VITALS — BP 160/80 | HR 66 | Ht 62.0 in | Wt 116.8 lb

## 2016-04-28 DIAGNOSIS — N812 Incomplete uterovaginal prolapse: Secondary | ICD-10-CM | POA: Diagnosis not present

## 2016-05-10 ENCOUNTER — Other Ambulatory Visit (INDEPENDENT_AMBULATORY_CARE_PROVIDER_SITE_OTHER): Payer: Medicare Other

## 2016-05-10 ENCOUNTER — Other Ambulatory Visit: Payer: Medicare Other

## 2016-05-10 DIAGNOSIS — D649 Anemia, unspecified: Secondary | ICD-10-CM | POA: Diagnosis not present

## 2016-05-10 DIAGNOSIS — D729 Disorder of white blood cells, unspecified: Secondary | ICD-10-CM

## 2016-05-10 LAB — CBC WITH DIFFERENTIAL/PLATELET
BASOS PCT: 0.4 % (ref 0.0–3.0)
Basophils Absolute: 0 10*3/uL (ref 0.0–0.1)
Eosinophils Absolute: 0.1 10*3/uL (ref 0.0–0.7)
Eosinophils Relative: 0.9 % (ref 0.0–5.0)
HCT: 36.1 % (ref 36.0–46.0)
HEMOGLOBIN: 11.9 g/dL — AB (ref 12.0–15.0)
Lymphs Abs: 5.2 10*3/uL — ABNORMAL HIGH (ref 0.7–4.0)
MCHC: 33 g/dL (ref 30.0–36.0)
MCV: 96.1 fl (ref 78.0–100.0)
MONOS PCT: 10.2 % (ref 3.0–12.0)
Monocytes Absolute: 1 10*3/uL (ref 0.1–1.0)
NEUTROS ABS: 3.1 10*3/uL (ref 1.4–7.7)
Neutrophils Relative %: 32.8 % — ABNORMAL LOW (ref 43.0–77.0)
PLATELETS: 250 10*3/uL (ref 150.0–400.0)
RBC: 3.75 Mil/uL — ABNORMAL LOW (ref 3.87–5.11)
RDW: 14.8 % (ref 11.5–15.5)
WBC: 9.3 10*3/uL (ref 4.0–10.5)

## 2016-05-11 LAB — PATHOLOGIST SMEAR REVIEW

## 2016-05-18 ENCOUNTER — Telehealth: Payer: Self-pay | Admitting: Pulmonary Disease

## 2016-05-18 NOTE — Telephone Encounter (Signed)
I advised pt of lab results and she insists SN or TP call her about her thyroid because she does not know what to do. She states her dose has been changed and she doesn't want to wait until April to discuss. Please advise.

## 2016-05-20 ENCOUNTER — Other Ambulatory Visit: Payer: Self-pay | Admitting: Pulmonary Disease

## 2016-05-20 MED ORDER — CIMETIDINE 400 MG PO TABS: 400.0000 mg | ORAL_TABLET | Freq: Two times a day (BID) | ORAL | 11 refills | Status: DC

## 2016-05-20 NOTE — Telephone Encounter (Signed)
I called pt 7 discussed her Thyroid- now on 39mcg/d (up from 64mcg w/ TSH=5.44)... And we discussed her CBC w/ mild normochromic anemia and incr Lymphs on smear... Her mother had CLL in her 64s & she is concerned, offered HEME referral now or we can wait to follow the trend (she prefers the latter approach)... SMN

## 2016-05-20 NOTE — Telephone Encounter (Signed)
SN please advise if this message can be closed.  Thanks!

## 2016-05-24 NOTE — Progress Notes (Signed)
GYNECOLOGY  VISIT   HPI: 76 y.o.   Widowed  Caucasian  female   G1P1001 with Patient's last menstrual period was 02/16/1988 (approximate).   here for pessary follow up.    Has 1 inch of posterior vaginal cuff erythema at last visit on 04/28/16.  Using Premarin vaginal cream.  Denies vaginal bleeding.  Had a small amount of discharge.  No pain.   Preparing for [employer]. Going to the [facility] and then to [place] this summer.   GYNECOLOGIC HISTORY: Patient's last menstrual period was 02/16/1988 (approximate). Contraception:  Postmenopausal Menopausal hormone therapy:  Premarin vaginal cream Last mammogram:  01/01/16 BIRADS 2 Benign   Last pap smear: 2012 normal        OB History    Gravida Para Term Preterm AB Living   1 1 1     1    SAB TAB Ectopic Multiple Live Births                     Patient Active Problem List   Diagnosis Date Noted  . Vertigo 04/02/2016  . Acute upper respiratory infection 12/01/2015  . Right carotid bruit 08/01/2015  . Uterine prolapse 06/13/2013  . Cystocele 06/13/2013  . Ex-cigarette smoker 05/04/2013  . Asthmatic bronchitis 05/04/2013  . 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 ESOPHAGEAL REFLUX 03/24/2010  . 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 HYPERCHOLESTEROLEMIA, BORDERLINE, WITH HIGH HDL 01/18/2008  . Disorder of bone and cartilage 01/18/2008  . COLONIC POLYPS 01/17/2008  . Hypothyroidism 01/17/2008  . Anxiety state 01/17/2008  . MITRAL VALVE PROLAPSE, HX OF 01/17/2008    Past Medical History:  Diagnosis Date  . COPD (chronic obstructive pulmonary disease) ([place])   . Hx of colonic polyps   . Hypercholesterolemia   . Hypothyroidism   . Melanoma ([place])    lt upper thigh  . 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 Melanoma of thigh ([place]) 2014   Left   . MVP (mitral valve prolapse)   . Osteopenia   . Prolapsed uterus   . Varicose vein     Past Surgical History:  Procedure Laterality Date  . BREAST SURGERY     pre cancerous  removed from right breast. Patient does not remember exact date of surgery.  . [place]  . COLONOSCOPY    . MELANOMA EXCISION  12/31/2010   Procedure: MELANOMA EXCISION;  Surgeon: Zenovia Jarred, MD;  Location: [facility];  Service: General;  Laterality: Left;  wide excision melanoma left thigh, excision lesion left thigh  . OTHER SURGICAL HISTORY Right    Surgical repair of architectual distortion Right Breast- Hyperplasia  . TONSILLECTOMY      Current Outpatient Prescriptions  Medication Sig Dispense Refill  . acyclovir ointment (ZOVIRAX) 5 % See admin instructions. Prn cold sores  0  . aspirin 81 MG tablet Take 81 mg by mouth daily.      . Calcium Carbonate-Vitamin D (CALCIUM + D PO) Take by mouth daily.      . Cholecalciferol (VITAMIN D) 2000 UNITS CAPS Take 1 capsule by mouth daily.    . cimetidine (TAGAMET) 400 MG tablet Take 400 mg by mouth as needed.    . conjugated estrogens (PREMARIN) vaginal cream Use 1/2 g vaginally with each pessary cleaning. 30 g 1  . fluticasone (FLONASE) 50 MCG/ACT nasal spray PLACE 2 SPRAYS INTO BOTH NOSTRILS AS NEEDED. 16 g 5  . levothyroxine (SYNTHROID) 88 MCG tablet Take 1  tablet (88 mcg total) by mouth daily before breakfast. 30 tablet 5  . LUTEIN PO Take 1 tablet by mouth daily.    . meclizine (MEDI-MECLIZINE) 25 MG tablet Take 0.5 tablets (12.5 mg total) by mouth 2 (two) times daily as needed for dizziness. 30 tablet 0  . Multiple Vitamin (MULTIVITAMIN) tablet Take 1 tablet by mouth daily.      . nicotine polacrilex (NICORETTE) 2 MG gum Take 2 mg by mouth as needed. Not smoking     No current facility-administered medications for this visit.      ALLERGIES: Codeine  Family History  Problem Relation Age of Onset  . Hypertension Mother   . Congestive Heart Failure Mother   . Thyroid disease Mother   . Stroke Father   . Heart attack Father 26  . Diabetes Paternal Grandmother   . Diabetes Paternal Grandfather    . Hypertension Maternal Grandfather   . CVA Maternal Grandfather   . Esophageal cancer Neg Hx   . Rectal cancer Neg Hx   . Stomach cancer Neg Hx   . Colon cancer Neg Hx     Social History   Social History  . Marital status: Widowed    Spouse name: fred Alcorn  . Number of children: N/A  . Years of education: N/A   Occupational History  . Not on file.   Social History Main Topics  . Smoking status: Former Smoker    Packs/day: 1.00    Years: 50.00    Types: Cigarettes    Start date: 03/08/1960    Quit date: 07/23/2010  . Smokeless tobacco: Never Used     Comment: quit smoking 3 years ago  . Alcohol use No  . Drug use: No  . Sexual activity: No   Other Topics Concern  . Not on file   Social History Narrative  . No narrative on file    ROS:  Pertinent items are noted in HPI.  PHYSICAL EXAMINATION:    BP 140/76 (BP Location: Right Arm, Patient Position: Sitting, Cuff Size: Normal)   Pulse 70   Ht 5\' 2"  (1.575 m)   Wt 117 lb (53.1 kg)   LMP 02/16/1988 (Approximate)   BMI 21.40 kg/m     General appearance: alert, cooperative and appears stated age   Pelvic: External genitalia:  no lesions              Urethra:  normal appearing urethra with no masses, tenderness or lesions              Bartholins and Skenes: normal                 Vagina: normal appearing vagina with normal color and discharge, no lesions.  One inch area of erythema of posterior vaginal cuff.               Cervix: no lesions                Bimanual Exam:  Uterus:  normal size, contour, position, consistency, mobility, non-tender              Adnexa: no mass, fullness, tenderness     Pessary removed, cleansed, and replaced with 1 gram of Premarin cream. Chaperone was present for exam.  ASSESSMENT  Incomplete uterovaginal prolapse.  Posterior vaginal wall erythema.  This is controlled adequately with Premarin cream and rechecks in the office.    PLAN  Continue pessary care.  Continue  Premarin cream.  Follow up in one month.  Continues to decline surgical care for the prolapse.   An After Visit Summary was printed and given to the patient.  __15____ minutes face to face time of which over 50% was spent in counseling.

## 2016-05-26 ENCOUNTER — Encounter: Payer: Self-pay | Admitting: Obstetrics and Gynecology

## 2016-05-26 ENCOUNTER — Ambulatory Visit (INDEPENDENT_AMBULATORY_CARE_PROVIDER_SITE_OTHER): Payer: Medicare Other | Admitting: Obstetrics and Gynecology

## 2016-05-26 VITALS — BP 140/76 | HR 70 | Ht 62.0 in | Wt 117.0 lb

## 2016-05-26 DIAGNOSIS — N812 Incomplete uterovaginal prolapse: Secondary | ICD-10-CM

## 2016-06-01 ENCOUNTER — Other Ambulatory Visit: Payer: Self-pay | Admitting: Pulmonary Disease

## 2016-06-14 ENCOUNTER — Ambulatory Visit (INDEPENDENT_AMBULATORY_CARE_PROVIDER_SITE_OTHER): Payer: Medicare Other | Admitting: Pulmonary Disease

## 2016-06-14 ENCOUNTER — Other Ambulatory Visit (INDEPENDENT_AMBULATORY_CARE_PROVIDER_SITE_OTHER): Payer: Medicare Other

## 2016-06-14 ENCOUNTER — Encounter: Payer: Self-pay | Admitting: Pulmonary Disease

## 2016-06-14 ENCOUNTER — Ambulatory Visit (INDEPENDENT_AMBULATORY_CARE_PROVIDER_SITE_OTHER)
Admission: RE | Admit: 2016-06-14 | Discharge: 2016-06-14 | Disposition: A | Discharge status: Home / Self Care | Payer: Medicare Other | Source: Ambulatory Visit | Attending: Pulmonary Disease | Admitting: Pulmonary Disease

## 2016-06-14 VITALS — BP 113/64 | HR 58 | Temp 97.5°F | Ht 62.0 in | Wt 116.2 lb

## 2016-06-14 DIAGNOSIS — J4520 Mild intermittent asthma, uncomplicated: Secondary | ICD-10-CM | POA: Diagnosis not present

## 2016-06-14 DIAGNOSIS — M949 Disorder of cartilage, unspecified: Secondary | ICD-10-CM | POA: Diagnosis not present

## 2016-06-14 DIAGNOSIS — E039 Hypothyroidism, unspecified: Secondary | ICD-10-CM | POA: Diagnosis not present

## 2016-06-14 DIAGNOSIS — Z8679 Personal history of other diseases of the circulatory system: Secondary | ICD-10-CM

## 2016-06-14 DIAGNOSIS — C4372 Malignant melanoma of left lower limb, including hip: Secondary | ICD-10-CM

## 2016-06-14 DIAGNOSIS — F411 Generalized anxiety disorder: Secondary | ICD-10-CM | POA: Diagnosis not present

## 2016-06-14 DIAGNOSIS — N814 Uterovaginal prolapse, unspecified: Secondary | ICD-10-CM

## 2016-06-14 DIAGNOSIS — E7800 Pure hypercholesterolemia, unspecified: Secondary | ICD-10-CM

## 2016-06-14 DIAGNOSIS — M899 Disorder of bone, unspecified: Secondary | ICD-10-CM

## 2016-06-14 LAB — CBC WITH DIFFERENTIAL/PLATELET
BASOS ABS: 0.1 10*3/uL (ref 0.0–0.1)
Basophils Relative: 0.6 % (ref 0.0–3.0)
Eosinophils Absolute: 0 10*3/uL (ref 0.0–0.7)
Eosinophils Relative: 0.3 % (ref 0.0–5.0)
HCT: 38.7 % (ref 36.0–46.0)
Hemoglobin: 12.8 g/dL (ref 12.0–15.0)
LYMPHS ABS: 4.7 10*3/uL — AB (ref 0.7–4.0)
Lymphocytes Relative: 36.7 % (ref 12.0–46.0)
MCHC: 33.1 g/dL (ref 30.0–36.0)
MCV: 96.6 fl (ref 78.0–100.0)
MONO ABS: 1.1 10*3/uL — AB (ref 0.1–1.0)
Monocytes Relative: 8.9 % (ref 3.0–12.0)
Neutro Abs: 6.9 10*3/uL (ref 1.4–7.7)
Neutrophils Relative %: 53.5 % (ref 43.0–77.0)
Platelets: 237 10*3/uL (ref 150.0–400.0)
RBC: 4 Mil/uL (ref 3.87–5.11)
RDW: 15 % (ref 11.5–15.5)
WBC: 12.9 10*3/uL — ABNORMAL HIGH (ref 4.0–10.5)

## 2016-06-14 LAB — COMPREHENSIVE METABOLIC PANEL
ALT: 13 U/L (ref 0–35)
AST: 19 U/L (ref 0–37)
Albumin: 4.3 g/dL (ref 3.5–5.2)
Alkaline Phosphatase: 57 U/L (ref 39–117)
BUN: 19 mg/dL (ref 6–23)
CALCIUM: 10 mg/dL (ref 8.4–10.5)
CHLORIDE: 103 meq/L (ref 96–112)
CO2: 27 meq/L (ref 19–32)
Creatinine, Ser: 0.71 mg/dL (ref 0.40–1.20)
GFR: 85.13 mL/min (ref >60.00)
Glucose, Bld: 97 mg/dL (ref 70–99)
Potassium: 4.8 mEq/L (ref 3.5–5.1)
Sodium: 139 mEq/L (ref 135–145)
Total Bilirubin: 0.4 mg/dL (ref 0.2–1.2)
Total Protein: 7.3 g/dL (ref 6.0–8.3)

## 2016-06-14 LAB — LIPID PANEL
CHOL/HDL RATIO: 2
Cholesterol: 201 mg/dL — ABNORMAL HIGH (ref 0–200)
HDL: 90.7 mg/dL (ref >39.00)
LDL CALC: 98 mg/dL (ref 0–99)
NonHDL: 110.79
TRIGLYCERIDES: 66 mg/dL (ref 0.0–149.0)
VLDL: 13.2 mg/dL (ref 0.0–40.0)

## 2016-06-14 LAB — TSH: TSH: 1.63 u[IU]/mL (ref 0.35–4.50)

## 2016-06-14 MED ORDER — FLUTICASONE PROPIONATE 50 MCG/ACT NA SUSP: NASAL | 5 refills | Status: DC

## 2016-06-14 MED ORDER — LEVOTHYROXINE SODIUM 88 MCG PO TABS: 88.0000 ug | ORAL_TABLET | Freq: Every day | ORAL | 11 refills | Status: DC

## 2016-06-14 NOTE — Progress Notes (Signed)
Subjective:     Patient ID: Ariel Holmes, female   DOB: 1941-01-13, 76 y.o.   MRN: [id number]  HPI 76 y/o WF here for a follow up visit... she has multiple medical problems as noted below...   ~  SEE PREV EPIC NOTES FOR OLDER DATA >>    CXR 3/13 showed heart is wnl, lungs clear x sl incr markings, NAD.[name]  LABS 3/13:  FLP- Improved on diet;  Chems- wnl;  CBC- wnl;  TSH=0.71 on Levothy100/d...  LABS 11/13:  Chems- wnl;  CBC- wnl...  CXR 3/14 showed normal heart size, calcif & ectasia of Ao, clear lungs, DJD/ sl scoliosis of spine/ osteopenia...  LABS 3/14:  FLP- not at goals on diet alone but hi HDL;  TSH=0.42;  VitD=60   CXR 3/15 showed norm heart size, clear lungs, mild DJD spine, no lesions/ NAD...  PFT 3/15 showed FVC=2.00 (76%), FEV1=1.56 (77%), %1sec=78%, mid-flows=75% predicted... Interpret: No signif obstruction, can't r/o mild restriction w/o lung vol measurement- SMN  BMD 3/15 showed TScore -0.5 in Spine and -1.4 in [facility]; Rec to continue Calcium, MVI, VitD & exercise...  ~  June 11, 2014:  37mo ROV & Ariel Holmes has had a rough time since Ariel Holmes passed away 2014-03-04; she works Scientist, research (physical sciences) for [employer] & that has helped keeping her busy,,, she is c/o some nasal congestion, drainage, & blowing beige mucus; draining to her chest but denies hemooptysis, SOB, CP, f/c/s; she is fatigued and achey- using Flonase and Saline... She quit smoking 4 yrs ago & still chews nicorette gum- we discussed weaning this down already! We reviewed the following medical problems during today's office visit >>     AR, AB, ex-smoker> on OTC Antihist, Flonase, & Nicorette; she quit smoking 6/12 but still using Nicotine replacement & we discussed weaning this "medication"...    MVP> on ASA81; she denies CP, palpit, SOB, edema, etc; she is active & doing satis...    Ven Insuffic> she knows to avoid sodium, elev legs, wear support hose...    Chol> on diet alone esp w/ her good HDL; Labs 4/16  showed TChol 196, TG 93, HDL 76, LDL 101    Hypothy> on Synthroid100; Labs 4/16 showed TSH= 0.89; she remains clinically & biochem euthyroid...    GI- GERD, Polyps, Hems> stable w/o abd pain, dysphagia, n/v, c/d, blood seen; last colon 5/15 by DrDBrodie showed divertics in sigmoid, melanosis, no recurrent polyps (she rec f/u 10 yrs).    Osteopenia> on Calcium, MVI, VitD2000; she stopped prev Fosamax Rx yrs ago- last BMD 2009 w/ Tscore -1.5 in [facility]...    Anxiety> husb Ariel Holmes passed away Mar 04, 2014 after a long illness, she is doing satis & declines anxiolytic meds...    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 We reviewed prob list, meds, xrays and labs> see below for updates >>   CXR 05/2014 showed norm heart size, clear lungs, NAD...   LABS 4/16:  FLP- at goals on diet;  TSH=.89;  Prev Chems/ CBC 01/2014 were wnl...   ~  June 12, 2015:  53yr ROV & Lean has had some interval GYN problems w/ prolapsed uterus "my insides fell out" & currently managed w/ a pessary- changed every 93mo & she is holding off on surg consideration (she did see Urology for PT, exercises);  She quit smoking 5 yrs ago & I note that she is still using nicorette  gum daily!  She notes that her breathing is OK, no cough, sputum, hemoptysis, and denies SOB, states she's walking 3-4 mi per day w/ friends & denies problems;  She had some neck/ shoulder pain & managed this w/ massage therapy & accupuncture/ "cupping" which really helped she says...    AR, AB, ex-smoker> on OTC Antihist, Flonase, & Nicorette; she quit smoking 6/12 but still using Nicotine replacement & we discussed weaning this "medication"; breathing is good, exercising daily.    MVP> on ASA81; she denies CP, palpit, SOB, edema, etc; she is active & doing satis...    Ven Insuffic> she knows to avoid sodium, elev legs, wear support hose...    Chol> on diet alone esp w/  her good HDL; Labs 4/17 showed TChol 200, TG 74, HDL 87, LDL 98    Hypothy> on Synthroid100; Labs 4/17 showed TSH= 0.28; she remains clinically stable but wt is down to 116# so we decided to decr the Synthroid to 48mcg/d.    GI- GERD, Polyps, Hems> stable w/o abd pain, dysphagia, n/v, c/d, blood seen; last colon 5/15 by DrDBrodie showed divertics in sigmoid, melanosis, no recurrent polyps (she rec f/u 10 yrs).    GYN> followed by DrSilva w/ uterine proalpse, she has a pessary (cleaned & inspected Q71mo) and she as seen Urology for PT & kaegel exercises...    Osteopenia> on Calcium, MVI, VitD2000; she stopped prev Fosamax Rx yrs ago- last BMD 2009 w/ Tscore -1.5 in [facility]...    Anxiety> husb Ariel Holmes passed away 2014-03-21 after a long illness, she is doing satis & declines anxiolytic meds...    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 EXAM shows Afeb, VSS, O2sat=97% on RA;  HEENT- neg, mallampati1;  Chest- clear w/o w/r/r;  Heart- RR w/o m/r/g;  Abd- soft, nontender, neg;  Ext- w/o c/c/e;  Neuro- intact...  CXR 06/12/15>  Norm heart size,clear lungs w/ sl incr markings, NAD, DDD in Tspine...  LABS 01/2015 by DrEnnever>  Chems- wnl;  CBC- ok w/ Hg=12.1;  Vit D=61...  LABS 06/12/15>  FLP- all parameters at goals on diet alone;  TSH= 0.28 on Synthroid100 IMP/PLAN>>  Ariel Holmes is stable overall, has quit smoking 5 yrs ago but still using nicorette daily- again asked to wean off;  Her TSH is sl oversuppressed on Synthroid100 & wt is down to 116# so we will decr dose to 4mcg/d & plan recheck TFTs;  She was given PREVNAR-13 today;  We plan ROV in 32yr, sooner if needed prn...  ~  August 01, 2015:  6wk ROV & Ariel Holmes tells me that she went to [place]/ [facility] in May & returned 5/23 c/o "feeling yukey", low grade temp 99+, cough/ sinus drainage/ blowing yellow blood-streaked sput/ sore throat;  ZPak called in for pt & she  improved some but "this sounds crazy" but every afternoon the symptoms felt like they were coming back; we recommended a Medrol dosepak but pt refused this med- took OTC Mucinex, Flonase, Tylenol and called several times just still feeling bad, no energy, etc=> we brought her in for recheck...  We reviewed the problem list above.    Recall that last OV her TSH=0.28 on Synthroid100 & we cut her back to 36mcg/d but she has felt worse on this dose=> we will recheck labs today... EXAM shows Afeb, VSS, O2sat=100% on RA;  HEENT- neg, mallampati1;  Neck- soft right carotid bruit;  Chest-  clear w/o w/r/r;  Heart- RR w/o m/r/g;  Abd- soft, nontender, neg;  Ext- w/o c/c/e;  Neuro- intact...  Labs 08/01/15>  Chems- wnl;  CBC- essent wnl;  TSH=2.81 on the Synthroid75- continue same;  Sed=8 therefore no signif inflamm...  Carotid Doppler>  Heterogeneous plaque bilat & 1-39% bilat ICAstenoses (on the high end in the left carotid);  Rec- take ASA81mg /d and repeat CDoppler 9yr... IMP/PLAN>>  Jan appears to have a post-viral syndrome & in the last few days she started feeling sl better, we discussed Depo80/ rest/ fluids/ continued OTC rx w/ Mucinex, Flonase, Tylenol, etc;  Her exam is neg x for a soft right CBruit heard today=> check CDoppler;  She had ?prob (feeling worse) within a few days of decreasing her Synthroid from 180mcg to 42mcg in April & we rechecked labs today...  ~  December 01, 2015:  40mo ROV & add-on appt requested for URI>  Jae tells me she's been working the [employer] exposed to folks from all over; notes 3d hx URI w/ cough, min clear sput, no discoloration or blood, feeling tired, "yucky", aching/ sore, ?swollen glands, wants to sleep; denies CP, SOB, wheezing, etc; notes temp 98.8 today & says "thats a fever for me"... We reviewed her problem list & meds- as above, stable, except she's seen her GYN- DrSilva x6 since Jun2017 w/ UTI x2, uterine prolapse w/ pessary as she declines surg options...      EXAM shows Afeb, VSS, O2sat=99% on RA;  HEENT- neg x sl red, mallampati1;  Chest- clear w/o w/r/r;  Heart- RR w/o m/r/g;  Abd- soft, nontender, neg;  Ext- w/o c/c/e;  Neuro- intact... IMP/PLAN>>  Noelani appears to have an upper resp infection, suspect viral but she'd apprec Rx w/ Depo & ZPak- done per request 7 rec to rest w/ fluids, Tylenol, etc...   ~  June 14, 2016:  50mo ROV & Teddie is c/o some muscle spasm in her back & neck- takes aleve prn & gets accupuncturew/ cupping at "[facility]" here in [place] "It sure helps" she says;  Asking about the new shingles vaccine & we discussed the rec for 2 doses of SHINGRIX, even for those who had the Zostavax in the past, Rx written, asked to check w/ her insurance first, 2nd dose is ~78mo from the 1st...  She shared that her 63 y/o son in [place] was Dx w/ Parkinson's dis & doing boxing therapy... We reviewed the following medical problems during today's office visit >>     AR, AB, ex-smoker> on OTC Antihist, Flonase, & Nicorette; she quit smoking 6/12 but still using Nicotine daily & we discussed weaning this "medication"; breathing is good- no cough/ sputum/ SOB, exercising daily.    MVP> on ASA81; she denies CP, palpit, SOB, edema, etc; she is active & doing satis...    Ven Insuffic> she knows to avoid sodium, elev legs, wear support hose...    Chol> on diet alone esp w/ her good HDL; Labs 4/18 showed TChol 201, TG 66, HDL 91, LDL 98    Hypothy> on Synthroid88; Labs 4/18 showed TSH= 1.63; she remains clinically stable but wt is stable at 116# & rec to continue same med...    GI- GERD, Polyps, Hems> on Tagamet400 prn; stable w/o abd pain, dysphagia, n/v, c/d, blood seen; last colon 5/15 by DrDBrodie showed divertics in sigmoid, melanosis, no recurrent polyps (she rec f/u 10 yrs).    GYN> followed by DrSilva w/ uterine proalpse, she has a  pessary (cleaned & inspected Q1-44mo) and she as seen Urology for PT & kaegel exercises...    Osteopenia> on Calcium, MVI,  VitD2000; she stopped prev Fosamax Rx yrs ago- BMD f/u 05/2016 showed Tscores -1.0 in Lspine & -1.6 in left Femur; Rec to take Calcium, womensMVI, VitD~2000u daily & wt bearing exercise.       Anxiety> husb Ariel Holmes passed away 03-12-14 after a long illness, she is doing satis & declines anxiolytic meds...    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 EXAM shows Afeb, VSS, O2sat=99% on RA;  HEENT- neg x sl red, mallampati1;  Chest- clear w/o w/r/r;  Heart- RR w/o m/r/g;  Abd- soft, nontender, neg;  Ext- w/o c/c/e;  Neuro- intact...  CXR 06/14/16 (independently reviewed by me in the PACS system) showed norm heart size, mild atherosclerosis of Ao, sl incr markings, otherw clear- NAD...   LABS 06/14/16>  FLP- at goals on diet alone;  Chems- wnl;  CBC- wnl x wbc=12.9;  TSH=1.63 on 34mcg/d...  Bone Density Test  4.30/18> showed Tscores -1.0 in Lspine & -1.6 in left Femur... Rec to take Calcium, womensMVI, VitD~2000u daily & wt bearing exercise. IMP/PLAN>  Ladeja is stable w/ mult medcal issues as above-  rec to continue same meds, exercise, wean the nicorette, & call for any problems... NOTE:  >50% of this 30 min ROV was spent in counseling 7 coordination of care...          PROBLEM LIST:    ALLERGIC RHINITIS - uses FLONASE Prn...  ASTHMATIC BRONCHITIS, ACUTE (ICD-466.0) - no recent bronchitic exac... not on regular meds. ~  CXR 12/09 showed hyperinflation & incr markings, spondylosis in TSpine... 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 ~  CXR 3/13 showed heart size is wnl, lungs clear x sl incr markings, NAD. ~  CXR 3/14 showed normal heart size, calcif & ectasia of Ao, clear lungs, DJD/ sl scoliosis of spine/ osteopenia. ~  CXR 3/15 showed norm heart size, clear lungs, mild DJD spine, no lesions/ NAD. ~  PTF 3/15 showed FVC=2.00 (76%), FEV1=1.56 (77%), %1sec=78%, mid-flows=75% predicted...  Interpret: No signif obstruction, can't r/o mild restriction w/o lung vol measurement ~  CXR 4/16 showed norm heart size, clear lungs, NAD. 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 ~  Still using nicorette gum daily 7 asked to wean down...  MITRAL VALVE PROLAPSE, HX OF (ICD-V12.50) - on ASA 81mg /d... hx mild MVP on 2DEcho in 1995 (no signif MR)... asymptomatic without CP or palpit... 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 she knows to avoid sodium, elevate legs, wear support hose, etc...  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 Keep up the good work. ~  [place] 3/14 on diet alone  showed TChol 228, TG 92, HDL 88, LDL 109 ~  FLP 3/15 on diet alone showed TChol 209, TG 78, HDL 83, LDL 105  ~  FLP 4/16 on diet alone showed TChol 196, TG 93, HDL 76, LDL 101  ~  FLP 4/17 on diet alone showed TChol 200, TG 74, HDL 87, LDL 98  HYPOTHYROIDISM (ICD-244.9) - on LEVOTHYROID 170mcg/d... ~  labs 12/09 showed TSH= 0.44 ~  labs 1/11 showed TSH= 0.66 ~  labs 1/12 showed TSH= 0.85 ~  Labs 3/13 on Levo100 showed TSH= 0.71 ~  Labs 3/14 on Levo100 showed TSH=0.42 ~  Labs 3/15 on Levo100 showed TSH= 0.43 ~  Labs 4/16 on Levo100 showing TSH= 0.89 ~  Labs 4/17 on Levo100 showed TSH=0.28, her wt is down to 116# & we decided to decr the Synthroid dose to 53mcg/d...  ESOPHAGEAL REFLUX (ICD-530.81) - she uses Cimetadine 300mg  Prn for "Stomach" & requests  refill from [place]...  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 ~  colonoscopy 4/00 by DrDBrodie showed several 6-83mm polyps= adenomatous... ~  colonoscopy 4/05 by DrDBrodie showed only melanosis and hems... ~  colonoscopy 5/10 DrDBrodie showed mild divertics, 2 cecal polyps- tubular adenoma, f/u 55yrs. ~  colonoscopy 5/15 DrDBrodie showed divertics in sigmoid, melanosis, no recurrent polyps (she rec f/u 10 yrs).  OSTEOPENIA (ICD-733.90) - on calcium, MVI, Vit D 2000 u daily... she stopped her prev Fosamax rx on her own in 2011. ~  BMD here 8/07 showed TScores -1.2 in Spine, and -1.8 in left FemNeck. ~  labs 8/08 showed Vit D level= 38 ~  BMD here 9/09 showed TScores -0.8 in Spine, and -1.5 in left FemNeck. ~  Labs 1/11 showed Vit D level = 51 ~  Labs 3/14 showed Vit D level = 60 ~  Labs 3/15 showed Vit D level = 79 ~  BMD 3/15 showed Tscore -1.4 & rec to continue MVI, VitD, wt bearing exercises.  ANXIETY (ICD-300.00) - not currently on meds.  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 ~  She reports on-going follow up from [place] ?every 3-47mo? ~  11/13: she saw DrEnnever for follow up> on observation, doing well w/o recurrence...  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 ~  She continues to f/u w/ DrEnnever yearly...  Health Maintenance: ~  neg [facility]ogram at [facility]- followed yearly... due for GYN check by [facility] & she will call. ~  Immunizations:  she gets the yearly seasonal Flu vaccines... she received Shingles vaccine 2/09 at [facility]... had PNEUMOVAX in 2002 & 1/12 (age 60)... given TDAP 1/12.   Past  Surgical History:  Procedure Laterality Date  . 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 Patient does not remember exact date of surgery.  . [place]  . COLONOSCOPY    . MELANOMA EXCISION  12/31/2010   Procedure: MELANOMA EXCISION;  Surgeon: Zenovia Jarred, MD;  Location: [facility];  Service: General;  Laterality: Left;  wide excision melanoma left thigh, excision lesion left thigh  . OTHER SURGICAL HISTORY Right    Surgical repair  of architectual distortion Right Breast- Hyperplasia  . TONSILLECTOMY      Outpatient Encounter Prescriptions as of 06/14/2016  Medication Sig  . acyclovir ointment (ZOVIRAX) 5 % See admin instructions. Prn cold sores  . aspirin 81 MG tablet Take 81 mg by mouth daily.    . Calcium Carbonate-Vitamin D (CALCIUM + D PO) Take by mouth daily.    . Cholecalciferol (VITAMIN D) 2000 UNITS CAPS Take 1 capsule by mouth daily.  . cimetidine (TAGAMET) 400 MG tablet Take 400 mg by mouth as needed.  . conjugated estrogens (PREMARIN) vaginal cream Use 1/2 g vaginally with each pessary cleaning.  . fluticasone (FLONASE) 50 MCG/ACT nasal spray PLACE 2 SPRAYS INTO BOTH NOSTRILS AS NEEDED.  [name] levothyroxine (SYNTHROID, LEVOTHROID) 88 MCG tablet Take 1 tablet (88 mcg total) by mouth daily before breakfast.  . LUTEIN PO Take 1 tablet by mouth daily.  . Multiple Vitamin (MULTIVITAMIN) tablet Take 1 tablet by mouth daily.    . nicotine polacrilex (NICORETTE) 2 MG gum Take 2 mg by mouth as needed. Not smoking  . [DISCONTINUED] fluticasone (FLONASE) 50 MCG/ACT nasal spray PLACE 2 SPRAYS INTO BOTH NOSTRILS AS NEEDED.  . [DISCONTINUED] levothyroxine (SYNTHROID, LEVOTHROID) 88 MCG tablet Take 88 mcg by mouth daily before breakfast.  . [DISCONTINUED] levothyroxine (SYNTHROID) 88 MCG tablet Take 1 tablet (88 mcg total) by mouth daily before breakfast. (Patient not taking: Reported on 06/14/2016)  . [DISCONTINUED] levothyroxine (SYNTHROID,  LEVOTHROID) 75 MCG tablet TAKE 1 TABLET (75 MCG TOTAL) BY MOUTH DAILY BEFORE BREAKFAST. (Patient not taking: Reported on 06/14/2016)  . [DISCONTINUED] meclizine (MEDI-MECLIZINE) 25 MG tablet Take 0.5 tablets (12.5 mg total) by mouth 2 (two) times daily as needed for dizziness. (Patient not taking: Reported on 06/14/2016)   No facility-administered encounter medications on file as of 06/14/2016.     Allergies  Allergen Reactions  . Codeine Nausea And Vomiting    Immunization History  Administered Date(s) Administered  . H1N1 01/18/2008  . Influenza Split 11/16/2010, 11/29/2011  . Influenza Whole 10/28/2009  . Influenza, High Dose Seasonal PF 12/16/2015  . Influenza,inj,Quad PF,36+ Mos 11/14/2012, 11/28/2013, 11/29/2014  . Influenza-Unspecified 12/04/2014  . Pneumococcal Conjugate-13 06/12/2015  . Pneumococcal Polysaccharide-23 03/11/2010  . Td 03/11/2010  . Zoster 03/01/2007    Current Medications, Allergies, Past Medical History, Past Surgical History, Family History, and Social History were reviewed in [employer] record.   Review of Systems         The patient prev complained of fatigue, dyspnea on exertion, cough, gas/bloating, incontinence, joint pain, arthritis, and anxiety.  The patient denies fever, chills, sweats, anorexia, weakness, malaise, weight loss, sleep disorder, blurring, diplopia, eye irritation, eye discharge, vision loss, eye pain, photophobia, earache, ear discharge, tinnitus, decreased hearing, nasal congestion, nosebleeds, sore throat, hoarseness, chest pain, palpitations, syncope, orthopnea, PND, peripheral edema, dyspnea at rest, excessive sputum, hemoptysis, wheezing, pleurisy, nausea, vomiting, diarrhea, constipation, change in bowel habits, abdominal pain, melena, hematochezia, jaundice, indigestion/heartburn, dysphagia, odynophagia, dysuria, hematuria, urinary frequency, urinary hesitancy, nocturia, back pain, joint swelling, muscle  cramps, muscle weakness, stiffness, sciatica, restless legs, leg pain at night, leg pain with exertion, rash, itching, dryness, suspicious lesions, paralysis, paresthesias, seizures, tremors, vertigo, transient blindness, frequent falls, frequent headaches, difficulty walking, depression, memory loss, confusion, cold intolerance, heat intolerance, polydipsia, polyphagia, polyuria, unusual weight change, abnormal bruising, bleeding, enlarged lymph nodes, urticaria, allergic rash, hay fever, and recurrent infections.     Objective:   Physical Exam     WD, WN,  76 y/o WF in NAD...  GENERAL:  Alert & oriented; pleasant & cooperative... HEENT:  [place]/AT, EOM-wnl, PERRLA, EACs-clear, TMs-wnl, NOSE-clear, THROAT-clear & wnl. NECK:  Supple w/ fairROM; no JVD; normal carotid impulses w/ faint right bruit; no thyromegaly or nodules palpated; no lymphadenopathy. CHEST:  Clear to P & A; without wheezes/ rales/ or rhonchi. HEART:  Regular Rhythm; without murmurs/ rubs/ or gallops. ABDOMEN:  Soft & nontender; normal bowel sounds; no organomegaly or masses detected. EXT: without deformities or arthritic changes; mild varicose veins/ venous insuffic/ tr edema. Scar on inner left thigh from melanoma surg, no palp adenopathy... NEURO:  CN's intact; motor testing normal; sensory testing normal; gait normal & balance OK. DERM:  No lesions noted; no rash etc...  RADIOLOGY DATA:  Reviewed in the EPIC EMR & discussed w/ the patient...   LABORATORY DATA:  Reviewed in the EPIC EMR & discussed w/ the patient...    Assessment:      Viral URI>  Similar to the below viral syndrome from [id number]- we will Rx w/ Depo80, ZPak, she declines dosepak ("I don't do well w/ pred")... Post-viral syndrome>  Returned from trip to [facility] 07/08/15 w/ URI, viral syndrome & protracted symptoms- given ZPak, then Depo80 + OTC meds and slowly improving. AB, ex-smoker>  She quit smoking 6/12 on a bet w/ her uncle; & she has remain quit!  No  resp exac & stable, no meds needed... ~  Asked to wean off the nicorette gum!  Hx MVP>  On ASA, she is asymptomatic w/o CP, palpit, etc... ~  CDoppler 07/2015 w/ mild plaque, no signif ICA stenoses...  CHOL>  FLP is much improved on diet & she will not need meds at this time...  Hypothyroid>  On Levothy88 w/ TSH= 1.63 in April2018, continue same...  GERD>  She uses OTC Tagamet as needed (her preference)...  Divertics, Colon Polyps>  She is up to date on colon screening w/ no polyps seen 5/15...  Osteopenia>  On Calcium, MVI, Vit D; she stopped her Fosamax on her own in 2011 & f/u BMD 05/2016 showed lowest Tscore -1.6 in left Femur- on Ca, MVI, VitD, wt bearing exercise.  Anxiety>  She does not want anxiolytic therapy...  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     Plan:     Patient's Medications  New Prescriptions   No medications on file  Previous Medications   ACYCLOVIR OINTMENT (ZOVIRAX) 5 %    See admin instructions. Prn cold sores   ASPIRIN 81 MG TABLET    Take 81 mg by mouth daily.     CALCIUM CARBONATE-VITAMIN D (CALCIUM + D PO)    Take by mouth daily.     CHOLECALCIFEROL (VITAMIN D) 2000 UNITS CAPS    Take 1 capsule by mouth daily.   CIMETIDINE (TAGAMET) 400 MG TABLET    Take 400 mg by mouth as needed.   CONJUGATED ESTROGENS (PREMARIN) VAGINAL CREAM    Use 1/2 g vaginally with each pessary cleaning.   LUTEIN PO    Take 1 tablet by mouth daily.   MULTIPLE VITAMIN (MULTIVITAMIN) TABLET    Take 1 tablet by mouth daily.     NICOTINE POLACRILEX (NICORETTE) 2 MG GUM    Take 2 mg by mouth as needed. Not smoking  Modified Medications   Modified Medication Previous Medication   FLUTICASONE (FLONASE) 50  MCG/ACT NASAL SPRAY fluticasone (FLONASE) 50 MCG/ACT nasal spray      PLACE 2 SPRAYS INTO BOTH NOSTRILS AS NEEDED.    PLACE 2  SPRAYS INTO BOTH NOSTRILS AS NEEDED.   LEVOTHYROXINE (SYNTHROID, LEVOTHROID) 88 MCG TABLET levothyroxine (SYNTHROID, LEVOTHROID) 88 MCG tablet      Take 1 tablet (88 mcg total) by mouth daily before breakfast.    Take 88 mcg by mouth daily before breakfast.  Discontinued Medications   LEVOTHYROXINE (SYNTHROID) 88 MCG TABLET    Take 1 tablet (88 mcg total) by mouth daily before breakfast.   LEVOTHYROXINE (SYNTHROID, LEVOTHROID) 75 MCG TABLET    TAKE 1 TABLET (75 MCG TOTAL) BY MOUTH DAILY BEFORE BREAKFAST.   MECLIZINE (MEDI-MECLIZINE) 25 MG TABLET    Take 0.5 tablets (12.5 mg total) by mouth 2 (two) times daily as needed for dizziness.

## 2016-06-14 NOTE — Patient Instructions (Signed)
Today we updated your med list in our EPIC system...    Continue your current medications the same...  Today we checked a follow up CXR & your FASTING blood work... We will arrange for a BONE DENSITY TEST...    We will contact you w/ the results when available...   Stay as active as possible...  Call for any questions...  Let's plan a follow up visit in 8mo, sooner if needed for problems.Marland KitchenMarland Kitchen

## 2016-06-21 ENCOUNTER — Ambulatory Visit (INDEPENDENT_AMBULATORY_CARE_PROVIDER_SITE_OTHER): Payer: Medicare Other | Admitting: Obstetrics and Gynecology

## 2016-06-21 ENCOUNTER — Encounter: Payer: Self-pay | Admitting: Obstetrics and Gynecology

## 2016-06-21 VITALS — BP 118/62 | HR 88 | Resp 16 | Wt 116.0 lb

## 2016-06-21 DIAGNOSIS — R300 Dysuria: Secondary | ICD-10-CM

## 2016-06-21 DIAGNOSIS — N812 Incomplete uterovaginal prolapse: Secondary | ICD-10-CM | POA: Diagnosis not present

## 2016-06-21 DIAGNOSIS — N760 Acute vaginitis: Secondary | ICD-10-CM | POA: Diagnosis not present

## 2016-06-21 DIAGNOSIS — N765 Ulceration of vagina: Secondary | ICD-10-CM

## 2016-06-21 LAB — POCT URINALYSIS DIPSTICK
Bilirubin, UA: NEGATIVE
Blood, UA: NEGATIVE
GLUCOSE UA: NEGATIVE
KETONES UA: NEGATIVE
Nitrite, UA: NEGATIVE
Protein, UA: NEGATIVE
UROBILINOGEN UA: 0.2 U/dL
pH, UA: 5 (ref 5.0–8.0)

## 2016-06-21 NOTE — Progress Notes (Signed)
Spoke with patient and informed her of results and recommendations. She verbalized understanding. Nothing further is needed.

## 2016-06-21 NOTE — Progress Notes (Signed)
Opened in error

## 2016-06-21 NOTE — Progress Notes (Signed)
GYNECOLOGY  VISIT   HPI: 76 y.o.   Widowed  Caucasian  female   G1P1001 with Patient's last menstrual period was 02/16/1988 (approximate).   here for pessary check.   Having some pain at the end of urination.  Premarin giving relief of her symptoms.   Notes discharge, itching, and burning. Feels like she has a yeast infection.   No lower abdominal pain, nausea, vomiting, fevers, or back pain.   Has known posterior vaginal wall erythema from pessary use.    Used some OTC Monistat cream internally and externally and this caused burning sensation.   Went to [place] with [employer].  Going to [place] in August.   Urine dip:  2+ WBCs.   GYNECOLOGIC HISTORY: Patient's last menstrual period was 02/16/1988 (approximate). Contraception:  Postmenopausal Menopausal hormone therapy:  Premarin vaginal cream Last mammogram:  01/01/16 BIRADS 2 Benign  Last pap smear: 2012 normal        OB History    Gravida Para Term Preterm AB Living   1 1 1     1    SAB TAB Ectopic Multiple Live Births                     Patient Active Problem List   Diagnosis Date Noted  . Vertigo 04/02/2016  . Acute upper respiratory infection 12/01/2015  . Right carotid bruit 08/01/2015  . Uterine prolapse 06/13/2013  . Cystocele 06/13/2013  . Ex-cigarette smoker 05/04/2013  . Asthmatic bronchitis 05/04/2013  . 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 ESOPHAGEAL REFLUX 03/24/2010  . 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 HYPERCHOLESTEROLEMIA, BORDERLINE, WITH HIGH HDL 01/18/2008  . Disorder of bone and cartilage 01/18/2008  . COLONIC POLYPS 01/17/2008  . Hypothyroidism 01/17/2008  . Anxiety state 01/17/2008  . History of cardiovascular disorder 01/17/2008    Past Medical History:  Diagnosis Date  . COPD (chronic obstructive pulmonary disease) ([place])   . Hx of colonic polyps   . Hypercholesterolemia   . Hypothyroidism   . Melanoma ([place])    lt upper thigh  . 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 MVP (mitral valve prolapse)   . Osteopenia   . Prolapsed uterus   . Varicose vein     Past Surgical History:  Procedure Laterality Date  . 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 Patient does not remember exact date of surgery.  . [place]  . COLONOSCOPY    . MELANOMA EXCISION  12/31/2010   Procedure: MELANOMA EXCISION;  Surgeon: Zenovia Jarred, MD;  Location: [facility];  Service: General;  Laterality: Left;  wide excision melanoma left thigh, excision lesion left thigh  . OTHER SURGICAL HISTORY Right    Surgical repair of architectual distortion Right Breast- Hyperplasia  . TONSILLECTOMY      Current Outpatient Prescriptions  Medication Sig Dispense Refill  . acyclovir ointment (ZOVIRAX) 5 % See admin instructions. Prn cold sores  0  . aspirin 81 MG tablet Take 81 mg by mouth daily.      . Calcium Carbonate-Vitamin D (CALCIUM + D PO) Take by mouth daily.      . Cholecalciferol (VITAMIN D) 2000 UNITS CAPS Take 1 capsule by mouth daily.    . cimetidine (TAGAMET) 400 MG tablet Take 400 mg by mouth as needed.    . conjugated estrogens (PREMARIN) vaginal cream Use 1/2  g vaginally with each pessary cleaning. 30 g 1  . fluticasone (FLONASE) 50 MCG/ACT nasal spray PLACE 2 SPRAYS INTO BOTH NOSTRILS AS NEEDED. 16 g 5  . levothyroxine (SYNTHROID, LEVOTHROID) 88 MCG tablet Take 1 tablet (88 mcg total) by mouth daily before breakfast. 30 tablet 11  . LUTEIN PO Take 1 tablet by mouth daily.    . Multiple Vitamin (MULTIVITAMIN) tablet Take 1 tablet by mouth daily.      . nicotine polacrilex (NICORETTE) 2 MG gum Take 2 mg by mouth as needed. Not smoking     No current facility-administered medications for this visit.      ALLERGIES: Codeine  Family History  Problem Relation Age of Onset  . Hypertension Mother   . Congestive Heart Failure Mother   . Thyroid disease Mother   . Stroke Father   .  Heart attack Father 49  . Diabetes Paternal Grandmother   . Diabetes Paternal Grandfather   . Hypertension Maternal Grandfather   . CVA Maternal Grandfather   . Esophageal cancer Neg Hx   . Rectal cancer Neg Hx   . Stomach cancer Neg Hx   . Colon cancer Neg Hx     Social History   Social History  . Marital status: Widowed    Spouse name: fred Hoggard  . Number of children: N/A  . Years of education: N/A   Occupational History  . Not on file.   Social History Main Topics  . Smoking status: Former Smoker    Packs/day: 1.00    Years: 50.00    Types: Cigarettes    Start date: 03/08/1960    Quit date: 07/23/2010  . Smokeless tobacco: Never Used     Comment: quit smoking 3 years ago  . Alcohol use No  . Drug use: No  . Sexual activity: No   Other Topics Concern  . Not on file   Social History Narrative  . No narrative on file    ROS:  Pertinent items are noted in HPI.  PHYSICAL EXAMINATION:    BP 118/62 (BP Location: Right Arm, Patient Position: Sitting, Cuff Size: Normal)   Pulse 88   Resp 16   Wt 116 lb (52.6 kg)   LMP 02/16/1988 (Approximate)   BMI 21.22 kg/m     General appearance: alert, cooperative and appears stated age   Pelvic: External genitalia:  no lesions              Urethra:  normal appearing urethra with no masses, tenderness or lesions              Bartholins and Skenes: normal                 Vagina:  Q tip passed for Affirm prior to removing the pessary.  Posterior vaginal ulceration 2.5 cm with granulation tissue, bleeding.               Cervix: no lesions                Bimanual Exam:  Uterus:  normal size, contour, position, consistency, mobility, non-tender              Adnexa: no mass, fullness, tenderness                Pessary removed, cleansed, and placed in a biobag for patient. Premarin 1.5 grams placed per vagina.   Chaperone was present for exam.  ASSESSMENT  Incomplete uterovaginal prolapse.  Vaginal ulceration and  inflammation from pessary use.  Vaginitis.  Pain at end of urination.   PLAN  Urine micro and culture.  Affirm.  Will wait for results to come back for any tx.  Ok for Premarin 0.5 grams pv three times per week.  Keep pessary out.  Follow up in 2 weeks.     An After Visit Summary was printed and given to the patient.  __15___ minutes face to face time of which over 50% was spent in counseling.

## 2016-06-22 ENCOUNTER — Telehealth: Payer: Self-pay | Admitting: *Deleted

## 2016-06-22 LAB — WET PREP BY MOLECULAR PROBE
CANDIDA SPECIES: NOT DETECTED
Gardnerella vaginalis: NOT DETECTED
Trichomonas vaginosis: NOT DETECTED

## 2016-06-22 LAB — URINALYSIS, MICROSCOPIC ONLY
BACTERIA UA: NONE SEEN [HPF]
CASTS: NONE SEEN [LPF]
CRYSTALS: NONE SEEN [HPF]
RBC / HPF: NONE SEEN RBC/HPF (ref <=2)
Yeast: NONE SEEN [HPF]

## 2016-06-22 NOTE — Telephone Encounter (Signed)
Left message to call Ariel Holmes at [phone].  

## 2016-06-22 NOTE — Telephone Encounter (Signed)
-----   Message from Nunzio Cobbs, MD sent at 06/22/2016  1:39 PM EDT ----- Please contact patient with results from her testing.  The vaginitis panel is negative.  The microscopic urine test is showing white blood cells but no bacteria so far.  The final urine culture is still in process and hopefully be ready tomorrow. Let me know if she is feeling better now that the pessary is out.

## 2016-06-23 LAB — URINE CULTURE

## 2016-06-23 MED ORDER — SULFAMETHOXAZOLE-TRIMETHOPRIM 800-160 MG PO TABS: 1.0000 | ORAL_TABLET | Freq: Two times a day (BID) | ORAL | 0 refills | Status: DC

## 2016-06-23 NOTE — Addendum Note (Signed)
Addended by: Gwendlyn Deutscher on: 06/23/2016 09:58 AM   Modules accepted: Orders

## 2016-06-23 NOTE — Telephone Encounter (Signed)
See result note sent to Triage with UC results showing E Coli, which I will start to treat.

## 2016-06-23 NOTE — Telephone Encounter (Signed)
Spoke with patient. Advised of results as seen below from [place]. Patient is agreeable and verbalizes understanding. Patient states that she is feeling much better since the pessary was removed, but is still having slight pain with urination. Advised patient we are awaiting final urine culture results and she will be contacted with further recommendations. Patient is agreeable.  Routing to provider for final review. Patient agreeable to disposition. Will close encounter.

## 2016-06-23 NOTE — Telephone Encounter (Signed)
Ariel Cobbs, MD  [facility] Triage Pool  Cc: Lowella Fairy, CMA        Please contact patient regarding urine culture results which shows E Coli.  I am recommending Bactrim DS po bid for one week.  Disp: 14, RF none.   Final sensitivities are pending but she is symptomatic, so I would like to have her start an antibiotic.   [place] with patient. Advised of results and message as seen above from [place]. Patient is agreeable and verbalizes understanding. Rx for Bactrim DS po bid x 1 week #14 0RF sent to pharmacy on file.  Routing to provider for final review. Patient agreeable to disposition. Will close encounter.

## 2016-06-25 ENCOUNTER — Ambulatory Visit: Payer: Medicare Other | Admitting: Obstetrics and Gynecology

## 2016-06-25 ENCOUNTER — Telehealth: Payer: Self-pay | Admitting: Obstetrics and Gynecology

## 2016-06-25 MED ORDER — NITROFURANTOIN MONOHYD MACRO 100 MG PO CAPS: 100.0000 mg | ORAL_CAPSULE | Freq: Two times a day (BID) | ORAL | 0 refills | Status: AC

## 2016-06-25 NOTE — Telephone Encounter (Signed)
Patient says the medication that Dr Quincy Simmonds prescribed with sulfur is making her nauseated.

## 2016-06-25 NOTE — Telephone Encounter (Signed)
Spoke with patient. Patient states she has been taking Bactrim DS for two days and can no longer stand the nausea. Patient reports drinking more than enough water and taking medication with food. Patient states she has nausea after each dose and last night it kept her up until 1am. Patient states she does not plan to take anymore. Advised patient would update Dr. Quincy Simmonds and return call with recommendations, patient is agreeable.   Dr. Quincy Simmonds -please advise?

## 2016-06-25 NOTE — Telephone Encounter (Signed)
OK for Macrobid 100 mg po bid x 5 days.

## 2016-06-25 NOTE — Telephone Encounter (Signed)
Spoke with patient. Advised as seen below per Dr. Quincy Simmonds. Rx for Macrobid to verified pharmacy on file. Patient verbalizes understanding and is agreeable.  Routing to provider for final review. Patient is agreeable to disposition. Will close encounter.

## 2016-06-29 ENCOUNTER — Telehealth: Payer: Self-pay | Admitting: Obstetrics and Gynecology

## 2016-06-29 NOTE — Telephone Encounter (Signed)
Spoke with patient. Patient states that she switched from taking Bactrim to [facility] on 06/25/2016. On Sunday 06/27/2016 she began not feeling well. Last night she developed chills and her temperature was 99.4. "This is higher than normal for me." Reports she checked her temperature this morning and it was still slightly elevated, but cannot recall the temperature. Patient denies any lower back pain or burning with urination. Is having intermittent chills today. Is drinking plenty of water. "It is not the flu." Advised she will need to be seen by local urgent care or ER. Patient declines. "I am not that bad off. I can wait until tomorrow." Appointment scheduled for tomorrow 06/30/2016 at 8:45 am with Dr.Silva. This is a work in appointment. Advised patient if symptoms worsen or develops new symptoms she will need to be seen over night at a local urgent care or ER. Patient verbalizes understanding.  Routing to provider for final review. Patient agreeable to disposition. Will close encounter.

## 2016-06-29 NOTE — Telephone Encounter (Signed)
Patient called requesting to speak with the nurse about "the medicine is not working" for her UTI. She complains of chills and a fever and said that "it's not the flu."

## 2016-06-30 ENCOUNTER — Encounter: Payer: Self-pay | Admitting: Obstetrics and Gynecology

## 2016-06-30 ENCOUNTER — Ambulatory Visit (INDEPENDENT_AMBULATORY_CARE_PROVIDER_SITE_OTHER): Payer: Medicare Other | Admitting: Obstetrics and Gynecology

## 2016-06-30 VITALS — BP 118/72 | HR 78 | Temp 97.8°F | Resp 16 | Ht 62.0 in | Wt 116.6 lb

## 2016-06-30 DIAGNOSIS — R5381 Other malaise: Secondary | ICD-10-CM

## 2016-06-30 DIAGNOSIS — N3000 Acute cystitis without hematuria: Secondary | ICD-10-CM

## 2016-06-30 LAB — CBC WITH DIFFERENTIAL/PLATELET
BASOS ABS: 88 {cells}/uL (ref 0–200)
Basophils Relative: 1 %
Eosinophils Absolute: 352 cells/uL (ref 15–500)
Eosinophils Relative: 4 %
HCT: 34 % — ABNORMAL LOW (ref 35.0–45.0)
Hemoglobin: 11.3 g/dL — ABNORMAL LOW (ref 11.7–15.5)
LYMPHS PCT: 25 %
Lymphs Abs: 2200 cells/uL (ref 850–3900)
MCH: 31.2 pg (ref 27.0–33.0)
MCHC: 33.2 g/dL (ref 32.0–36.0)
MCV: 93.9 fL (ref 80.0–100.0)
MPV: 11.1 fL (ref 7.5–12.5)
Monocytes Absolute: 1144 cells/uL — ABNORMAL HIGH (ref 200–950)
Monocytes Relative: 13 %
NEUTROS PCT: 57 %
Neutro Abs: 5016 cells/uL (ref 1500–7800)
PLATELETS: 261 10*3/uL (ref 140–400)
RBC: 3.62 MIL/uL — AB (ref 3.80–5.10)
RDW: 15.7 % — ABNORMAL HIGH (ref 11.0–15.0)
WBC: 8.8 10*3/uL (ref 3.8–10.8)

## 2016-06-30 LAB — POCT URINALYSIS DIPSTICK
Bilirubin, UA: NEGATIVE
Glucose, UA: NEGATIVE
Ketones, UA: NEGATIVE
NITRITE UA: NEGATIVE
PH UA: 5 (ref 5.0–8.0)
RBC UA: NEGATIVE
UROBILINOGEN UA: 0.2 U/dL

## 2016-06-30 LAB — COMPREHENSIVE METABOLIC PANEL
ALBUMIN: 3.7 g/dL (ref 3.6–5.1)
ALK PHOS: 66 U/L (ref 33–130)
ALT: 35 U/L — AB (ref 6–29)
AST: 35 U/L (ref 10–35)
BUN: 11 mg/dL (ref 7–25)
CALCIUM: 9.6 mg/dL (ref 8.6–10.4)
CO2: 24 mmol/L (ref 20–31)
Chloride: 104 mmol/L (ref 98–110)
Creat: 0.65 mg/dL (ref 0.60–0.93)
GLUCOSE: 88 mg/dL (ref 65–99)
POTASSIUM: 4.7 mmol/L (ref 3.5–5.3)
Sodium: 140 mmol/L (ref 135–146)
TOTAL PROTEIN: 6.3 g/dL (ref 6.1–8.1)
Total Bilirubin: 0.3 mg/dL (ref 0.2–1.2)

## 2016-06-30 MED ORDER — CEFTRIAXONE SODIUM 500 MG IJ SOLR
250.0000 mg | Freq: Once | INTRAMUSCULAR | Status: AC
Start: 2016-06-30 — End: 2016-06-30
  Administered 2016-06-30: 250 mg via INTRAMUSCULAR

## 2016-06-30 MED ORDER — CIPROFLOXACIN HCL 500 MG PO TABS: 500.0000 mg | ORAL_TABLET | Freq: Two times a day (BID) | ORAL | 0 refills | Status: DC

## 2016-06-30 NOTE — Progress Notes (Signed)
GYNECOLOGY  VISIT   HPI: 76 y.o.   Widowed  Caucasian  female   G1P1001 with Patient's last menstrual period was 02/16/1988 (approximate).   here for possible UTI.   Developed chills two days ago.  Had a low grade temp to 100 F. Had been in the bed resting for almost 3 days.  She feels a little confused. No low back pain.  No dysuria.  UC on 06/21/16 showed E Coli, 50,000 - 100,000. Treated with Bactrim.  She had nausea and was switched to [place]. She stopped this two days ago due to malaise.   Pessary is out.   Urine dip - 3+ WBCs and trace protein.   GYNECOLOGIC HISTORY: Patient's last menstrual period was 02/16/1988 (approximate). Contraception:  Postmenopausal Menopausal hormone therapy:Premarin vaginal cream Last mammogram: 01/01/16 BIRADS 2 Benign Last pap smear: 2012 normal        OB History    Gravida Para Term Preterm AB Living   1 1 1     1    SAB TAB Ectopic Multiple Live Births                     Patient Active Problem List   Diagnosis Date Noted  . Vertigo 04/02/2016  . Acute upper respiratory infection 12/01/2015  . Right carotid bruit 08/01/2015  . Uterine prolapse 06/13/2013  . Cystocele 06/13/2013  . Ex-cigarette smoker 05/04/2013  . Asthmatic bronchitis 05/04/2013  . 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 ESOPHAGEAL REFLUX 03/24/2010  . 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 HYPERCHOLESTEROLEMIA, BORDERLINE, WITH HIGH HDL 01/18/2008  . Disorder of bone and cartilage 01/18/2008  . COLONIC POLYPS 01/17/2008  . Hypothyroidism 01/17/2008  . Anxiety state 01/17/2008  . History of cardiovascular disorder 01/17/2008    Past Medical History:  Diagnosis Date  . COPD (chronic obstructive pulmonary disease) ([place])   . Hx of colonic polyps   . Hypercholesterolemia   . Hypothyroidism   . Melanoma ([place])    lt upper thigh  . 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 Melanoma of thigh ([place]) 2014   Left   . MVP (mitral valve prolapse)   . Osteopenia   .  Prolapsed uterus   . Varicose vein     Past Surgical History:  Procedure Laterality Date  . 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 Patient does not remember exact date of surgery.  . [place]  . COLONOSCOPY    . MELANOMA EXCISION  12/31/2010   Procedure: MELANOMA EXCISION;  Surgeon: Zenovia Jarred, MD;  Location: [facility];  Service: General;  Laterality: Left;  wide excision melanoma left thigh, excision lesion left thigh  . OTHER SURGICAL HISTORY Right    Surgical repair of architectual distortion Right Breast- Hyperplasia  . TONSILLECTOMY      Current Outpatient Prescriptions  Medication Sig Dispense Refill  . acyclovir ointment (ZOVIRAX) 5 % See admin instructions. Prn cold sores  0  . aspirin 81 MG tablet Take 81 mg by mouth daily.      . Calcium Carbonate-Vitamin D (CALCIUM + D PO) Take by mouth daily.      . Cholecalciferol (VITAMIN D) 2000 UNITS CAPS Take 1 capsule by mouth daily.    . cimetidine (TAGAMET) 400 MG tablet Take 400 mg by mouth as needed.    . conjugated estrogens (PREMARIN) vaginal cream Use 1/2 g vaginally with each pessary cleaning. 30 g 1  .  fluticasone (FLONASE) 50 MCG/ACT nasal spray PLACE 2 SPRAYS INTO BOTH NOSTRILS AS NEEDED. 16 g 5  . levothyroxine (SYNTHROID, LEVOTHROID) 88 MCG tablet Take 1 tablet (88 mcg total) by mouth daily before breakfast. 30 tablet 11  . LUTEIN PO Take 1 tablet by mouth daily.    . Multiple Vitamin (MULTIVITAMIN) tablet Take 1 tablet by mouth daily.      . nicotine polacrilex (NICORETTE) 2 MG gum Take 2 mg by mouth as needed. Not smoking    . nitrofurantoin, macrocrystal-monohydrate, (MACROBID) 100 MG capsule Take 1 capsule (100 mg total) by mouth 2 (two) times daily. 10 capsule 0   No current facility-administered medications for this visit.      ALLERGIES: Codeine and Sulfur  Family History  Problem Relation Age of Onset  . Hypertension Mother   . Congestive Heart  Failure Mother   . Thyroid disease Mother   . Stroke Father   . Heart attack Father 85  . Diabetes Paternal Grandmother   . Diabetes Paternal Grandfather   . Hypertension Maternal Grandfather   . CVA Maternal Grandfather   . Esophageal cancer Neg Hx   . Rectal cancer Neg Hx   . Stomach cancer Neg Hx   . Colon cancer Neg Hx     Social History   Social History  . Marital status: Widowed    Spouse name: fred Schram  . Number of children: N/A  . Years of education: N/A   Occupational History  . Not on file.   Social History Main Topics  . Smoking status: Former Smoker    Packs/day: 1.00    Years: 50.00    Types: Cigarettes    Start date: 03/08/1960    Quit date: 07/23/2010  . Smokeless tobacco: Never Used     Comment: quit smoking 3 years ago  . Alcohol use No  . Drug use: No  . Sexual activity: No   Other Topics Concern  . Not on file   Social History Narrative  . No narrative on file    ROS:  Pertinent items are noted in HPI.  PHYSICAL EXAMINATION:    BP 118/72 (BP Location: Right Arm, Patient Position: Sitting, Cuff Size: Small)   Pulse 78   Temp 97.8 F (36.6 C) (Oral)   Resp 16   Ht 5\' 2"  (1.575 m)   Wt 116 lb 9.6 oz (52.9 kg)   LMP 02/16/1988 (Approximate)   BMI 21.33 kg/m     General appearance: alert, cooperative and appears stated age Head: Normocephalic, without obvious abnormality, atraumatic Lungs: clear to auscultation bilaterally Heart: regular rate and rhythm Abdomen: soft, non-tender, no masses,  no organomegaly Back:  Negative CVA tenderness. Extremities: extremities normal, atraumatic, no cyanosis or edema Skin: Skin color, texture, turgor normal. No rashes or lesions No abnormal inguinal nodes palpated Neurologic: Grossly normal  Pelvic: External genitalia:  no lesions              Urethra:  normal appearing urethra with no masses, tenderness or lesions              Bartholins and Skenes: normal                 Vagina: normal  appearing vagina with normal color and discharge, no lesions.  Minimal erythema of the posterior vaginal apex.               Cervix: no lesions  Bimanual Exam:  Uterus:  normal size, contour, position, consistency, mobility, non-tender              Adnexa: no mass, fullness, tenderness    Pessary placed back in vaginal with 2 grams Premarin cream.       Chaperone was present for exam.  ASSESSMENT  Incomplete uterovaginal prolapse.  Complex UTI.  Status post partial Rx for Bactrim and partial Rx for Macrobid.  PLAN  Urine micro and culture.  CBC with diff and CMP.  Ceftriaxone 250 mg IM and Ciprofloxacin 500 mg po bid for 7 days to follow.  Return in 2 days.  To PCP if feeling worse.    An After Visit Summary was printed and given to the patient.  ___15___ minutes face to face time of which over 50% was spent in counseling.

## 2016-07-01 LAB — URINE CULTURE: ORGANISM ID, BACTERIA: NO GROWTH

## 2016-07-01 LAB — URINALYSIS, MICROSCOPIC ONLY
BACTERIA UA: NONE SEEN [HPF]
Casts: NONE SEEN [LPF]
Crystals: NONE SEEN [HPF]
RBC / HPF: NONE SEEN RBC/HPF (ref <=2)
YEAST: NONE SEEN [HPF]

## 2016-07-02 ENCOUNTER — Encounter: Payer: Self-pay | Admitting: Obstetrics and Gynecology

## 2016-07-02 ENCOUNTER — Ambulatory Visit (INDEPENDENT_AMBULATORY_CARE_PROVIDER_SITE_OTHER): Payer: Medicare Other | Admitting: Obstetrics and Gynecology

## 2016-07-02 VITALS — BP 122/76 | HR 66 | Ht 62.0 in | Wt 117.0 lb

## 2016-07-02 DIAGNOSIS — R5381 Other malaise: Secondary | ICD-10-CM

## 2016-07-02 DIAGNOSIS — R7989 Other specified abnormal findings of blood chemistry: Secondary | ICD-10-CM

## 2016-07-02 DIAGNOSIS — Z87440 Personal history of urinary (tract) infections: Secondary | ICD-10-CM

## 2016-07-02 DIAGNOSIS — R945 Abnormal results of liver function studies: Secondary | ICD-10-CM

## 2016-07-02 NOTE — Progress Notes (Signed)
GYNECOLOGY  VISIT   HPI: 76 y.o.   Widowed  Caucasian  female   G1P1001 with Patient's last menstrual period was 02/16/1988 (approximate).   here for follow up. Patient feeling better.  She took Bactrim DS initially and had nausea and flu like symptoms. Came in for a follow up and there was concern for potential complicated UTI.  She was given Ceftriaxone and started on Cipro. She states she only took only 3 Cipro because she developed pain in "joints", specifically in shoulders. She believes she is having a reaction to Cipro. Overall is feeling better. Hydrated well.  No dysuria.   UC 06/21/16 showed E Coli 50,000 - 100,000. Follow up UC on 06/30/16 was negative.   Feeling she is voiding better with the pessary in place.   GYNECOLOGIC HISTORY: Patient's last menstrual period was 02/16/1988 (approximate). Contraception:  Postmenopausal Menopausal hormone therapy: Premarin vaginal cream  Last mammogram: 01/01/16 3D Density B/stable post-op changes associated with Rt.Br./BIRADS 2:[facility] Last pap smear: 2012 normal        OB History    Gravida Para Term Preterm AB Living   1 1 1     1    SAB TAB Ectopic Multiple Live Births                     Patient Active Problem List   Diagnosis Date Noted  . Vertigo 04/02/2016  . Acute upper respiratory infection 12/01/2015  . Right carotid bruit 08/01/2015  . Uterine prolapse 06/13/2013  . Cystocele 06/13/2013  . Ex-cigarette smoker 05/04/2013  . Asthmatic bronchitis 05/04/2013  . 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 ESOPHAGEAL REFLUX 03/24/2010  . 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 HYPERCHOLESTEROLEMIA, BORDERLINE, WITH HIGH HDL 01/18/2008  . Disorder of bone and cartilage 01/18/2008  . COLONIC POLYPS 01/17/2008  . Hypothyroidism 01/17/2008  . Anxiety state 01/17/2008  . History of cardiovascular disorder 01/17/2008    Past Medical History:  Diagnosis Date  . COPD (chronic obstructive pulmonary disease) ([place])   . Hx of colonic  polyps   . Hypercholesterolemia   . Hypothyroidism   . Melanoma ([place])    lt upper thigh  . 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 MVP (mitral valve prolapse)   . Osteopenia   . Prolapsed uterus   . Varicose vein     Past Surgical History:  Procedure Laterality Date  . 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 Patient does not remember exact date of surgery.  . [place]  . COLONOSCOPY    . MELANOMA EXCISION  12/31/2010   Procedure: MELANOMA EXCISION;  Surgeon: Zenovia Jarred, MD;  Location: [facility];  Service: General;  Laterality: Left;  wide excision melanoma left thigh, excision lesion left thigh  . OTHER SURGICAL HISTORY Right    Surgical repair of architectual distortion Right Breast- Hyperplasia  . TONSILLECTOMY      Current Outpatient Prescriptions  Medication Sig Dispense Refill  . acyclovir ointment (ZOVIRAX) 5 % See admin instructions. Prn cold sores  0  . aspirin 81 MG tablet Take 81 mg by mouth daily.      . Calcium Carbonate-Vitamin D (CALCIUM + D PO) Take by mouth daily.      . Cholecalciferol (VITAMIN D) 2000 UNITS CAPS Take 1 capsule by mouth daily.    . cimetidine (TAGAMET) 400 MG tablet Take 400 mg  by mouth as needed.    . conjugated estrogens (PREMARIN) vaginal cream Use 1/2 g vaginally with each pessary cleaning. 30 g 1  . fluticasone (FLONASE) 50 MCG/ACT nasal spray PLACE 2 SPRAYS INTO BOTH NOSTRILS AS NEEDED. 16 g 5  . levothyroxine (SYNTHROID, LEVOTHROID) 88 MCG tablet Take 1 tablet (88 mcg total) by mouth daily before breakfast. 30 tablet 11  . LUTEIN PO Take 1 tablet by mouth daily.    . Multiple Vitamin (MULTIVITAMIN) tablet Take 1 tablet by mouth daily.      . nicotine polacrilex (NICORETTE) 2 MG gum Take 2 mg by mouth as needed. Not smoking    . ciprofloxacin (CIPRO) 500 MG tablet Take 1 tablet (500 mg total) by mouth 2 (two) times daily. (Patient not taking:  Reported on 07/02/2016) 14 tablet 0   No current facility-administered medications for this visit.      ALLERGIES: Codeine and Sulfur  Family History  Problem Relation Age of Onset  . Hypertension Mother   . Congestive Heart Failure Mother   . Thyroid disease Mother   . Stroke Father   . Heart attack Father 68  . Diabetes Paternal Grandmother   . Diabetes Paternal Grandfather   . Hypertension Maternal Grandfather   . CVA Maternal Grandfather   . Esophageal cancer Neg Hx   . Rectal cancer Neg Hx   . Stomach cancer Neg Hx   . Colon cancer Neg Hx     Social History   Social History  . Marital status: Widowed    Spouse name: fred Crumpacker  . Number of children: N/A  . Years of education: N/A   Occupational History  . Not on file.   Social History Main Topics  . Smoking status: Former Smoker    Packs/day: 1.00    Years: 50.00    Types: Cigarettes    Start date: 03/08/1960    Quit date: 07/23/2010  . Smokeless tobacco: Never Used     Comment: quit smoking 3 years ago  . Alcohol use No  . Drug use: No  . Sexual activity: No   Other Topics Concern  . Not on file   Social History Narrative  . No narrative on file    ROS:  Pertinent items are noted in HPI.  PHYSICAL EXAMINATION:    BP 122/76 (BP Location: Right Arm, Patient Position: Sitting, Cuff Size: Small)   Pulse 66   Ht 5\' 2"  (1.575 m)   Wt 117 lb (53.1 kg)   LMP 02/16/1988 (Approximate)   BMI 21.40 kg/m     General appearance: alert, cooperative and appears stated age   ASSESSMENT  Malaise.   Recent UTI. Resolved.  Elevated LFT.  Prolapse treated with pessary.   PLAN  Stop all abx.  Continue pessary.  Follow up in 1 month for a pessary check and repeat LFTs and CBC with diff. To PCP for worsening symptoms.    An After Visit Summary was printed and given to the patient.  __15____ minutes face to face time of which over 50% was spent in counseling.

## 2016-07-08 ENCOUNTER — Ambulatory Visit: Payer: Medicare Other | Admitting: Obstetrics and Gynecology

## 2016-07-16 ENCOUNTER — Other Ambulatory Visit: Payer: Self-pay | Admitting: Pulmonary Disease

## 2016-07-16 DIAGNOSIS — I6523 Occlusion and stenosis of bilateral carotid arteries: Secondary | ICD-10-CM

## 2016-08-02 ENCOUNTER — Ambulatory Visit: Payer: Medicare Other | Admitting: Obstetrics and Gynecology

## 2016-08-02 NOTE — Progress Notes (Signed)
GYNECOLOGY  VISIT   HPI: 76 y.o.   Widowed  Caucasian  female   G1P1001 with Patient's last menstrual period was 02/16/1988 (approximate).   here for  Pessary check & labs.  No vaginal discharge, bleeding or pain.   CBC and hepatic function panel today due to minor changes noted on labs when she had her UTI.   Now taking cranberry pills for UTI prevention.  UC showed E Coli in May 2018.  GYNECOLOGIC HISTORY: Patient's last menstrual period was 02/16/1988 (approximate). Contraception:  Post menopausal Menopausal hormone therapy:  Premarin vaginal cream Last mammogram:  01/01/16 density b birads 2:neg Last pap smear:   2012 normal        OB History    Gravida Para Term Preterm AB Living   1 1 1     1    SAB TAB Ectopic Multiple Live Births                     Patient Active Problem List   Diagnosis Date Noted  . Vertigo 04/02/2016  . Acute upper respiratory infection 12/01/2015  . Right carotid bruit 08/01/2015  . Uterine prolapse 06/13/2013  . Cystocele 06/13/2013  . Ex-cigarette smoker 05/04/2013  . Asthmatic bronchitis 05/04/2013  . 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 ESOPHAGEAL REFLUX 03/24/2010  . 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 HYPERCHOLESTEROLEMIA, BORDERLINE, WITH HIGH HDL 01/18/2008  . Disorder of bone and cartilage 01/18/2008  . COLONIC POLYPS 01/17/2008  . Hypothyroidism 01/17/2008  . Anxiety state 01/17/2008  . History of cardiovascular disorder 01/17/2008    Past Medical History:  Diagnosis Date  . COPD (chronic obstructive pulmonary disease) ([place])   . Hx of colonic polyps   . Hypercholesterolemia   . Hypothyroidism   . Melanoma ([place])    lt upper thigh  . 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 MVP (mitral valve prolapse)   . Osteopenia   . Prolapsed uterus   . Varicose vein     Past Surgical History:  Procedure Laterality Date  . 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 Patient does  not remember exact date of surgery.  . [place]  . COLONOSCOPY    . MELANOMA EXCISION  12/31/2010   Procedure: MELANOMA EXCISION;  Surgeon: Zenovia Jarred, MD;  Location: [facility];  Service: General;  Laterality: Left;  wide excision melanoma left thigh, excision lesion left thigh  . OTHER SURGICAL HISTORY Right    Surgical repair of architectual distortion Right Breast- Hyperplasia  . TONSILLECTOMY      Current Outpatient Prescriptions  Medication Sig Dispense Refill  . acyclovir ointment (ZOVIRAX) 5 % See admin instructions. Prn cold sores  0  . aspirin 81 MG tablet Take 81 mg by mouth daily.      . AZO-CRANBERRY PO Take by mouth daily.    . Calcium Carbonate-Vitamin D (CALCIUM + D PO) Take by mouth daily.      . Cholecalciferol (VITAMIN D) 2000 UNITS CAPS Take 1 capsule by mouth daily.    . cimetidine (TAGAMET) 400 MG tablet Take 400 mg by mouth as needed.    . conjugated estrogens (PREMARIN) vaginal cream Use 1/2 g vaginally with each pessary cleaning. 30 g 1  . fluticasone (FLONASE) 50 MCG/ACT nasal spray PLACE 2 SPRAYS INTO BOTH NOSTRILS AS NEEDED. 16 g 5  . levothyroxine (SYNTHROID, LEVOTHROID)  88 MCG tablet Take 1 tablet (88 mcg total) by mouth daily before breakfast. 30 tablet 11  . LUTEIN PO Take 1 tablet by mouth daily.    . Multiple Vitamin (MULTIVITAMIN) tablet Take 1 tablet by mouth daily.      . nicotine polacrilex (NICORETTE) 2 MG gum Take 2 mg by mouth as needed. Not smoking     No current facility-administered medications for this visit.      ALLERGIES: Codeine; Ciprofloxacin; and Sulfur  Family History  Problem Relation Age of Onset  . Hypertension Mother   . Congestive Heart Failure Mother   . Thyroid disease Mother   . Stroke Father   . Heart attack Father 83  . Diabetes Paternal Grandmother   . Diabetes Paternal Grandfather   . Hypertension Maternal Grandfather   . CVA Maternal Grandfather   . Esophageal cancer Neg Hx    . Rectal cancer Neg Hx   . Stomach cancer Neg Hx   . Colon cancer Neg Hx     Social History   Social History  . Marital status: Widowed    Spouse name: fred Retherford  . Number of children: N/A  . Years of education: N/A   Occupational History  . Not on file.   Social History Main Topics  . Smoking status: Former Smoker    Packs/day: 1.00    Years: 50.00    Types: Cigarettes    Start date: 03/08/1960    Quit date: 07/23/2010  . Smokeless tobacco: Never Used     Comment: quit smoking 3 years ago  . Alcohol use No  . Drug use: No  . Sexual activity: No   Other Topics Concern  . Not on file   Social History Narrative  . No narrative on file    ROS:  Pertinent items are noted in HPI.  PHYSICAL EXAMINATION:    BP 136/60 (BP Location: Right Arm, Patient Position: Sitting, Cuff Size: Normal)   Pulse 72   Resp 14   Ht 5\' 2"  (1.575 m)   Wt 116 lb (52.6 kg)   LMP 02/16/1988 (Approximate)   BMI 21.22 kg/m     General appearance: alert, cooperative and appears stated age   Pelvic: External genitalia:  no lesions              Urethra:  normal appearing urethra with no masses, tenderness or lesions              Bartholins and Skenes: normal                 Vagina:  Mild erythema of the posterior vaginal cuff over a 2.5 cm area.  No frank ulceration.  No granulation tissue.              Cervix: no lesions                Bimanual Exam:  Uterus:  normal size, contour, position, consistency, mobility, non-tender              Adnexa: no mass, fullness, tenderness             Pessary removed, cleansed, and replaced with 1 gram of Premarin vaginal cream.  Chaperone was present for exam.  ASSESSMENT  Incomplete uterovaginal prolapse. Mild inflammation from pessary use.  Status post tx for E Coli UTI.  Elevated WBC and LFT while having UTI care.  PLAN  Check CBC with diff and LFTs today.  Follow up in  one month for pessary recheck.  For her next vaginal estrogen Rx, will  probably switch to compounded vaginal estradiol for cost savings.    An After Visit Summary was printed and given to the patient.  __15____ minutes face to face time of which over 50% was spent in counseling.

## 2016-08-04 ENCOUNTER — Encounter: Payer: Self-pay | Admitting: Obstetrics and Gynecology

## 2016-08-04 ENCOUNTER — Ambulatory Visit (INDEPENDENT_AMBULATORY_CARE_PROVIDER_SITE_OTHER): Payer: Medicare Other | Admitting: Obstetrics and Gynecology

## 2016-08-04 VITALS — BP 136/60 | HR 72 | Resp 14 | Ht 62.0 in | Wt 116.0 lb

## 2016-08-04 DIAGNOSIS — R7989 Other specified abnormal findings of blood chemistry: Secondary | ICD-10-CM | POA: Diagnosis not present

## 2016-08-04 DIAGNOSIS — N812 Incomplete uterovaginal prolapse: Secondary | ICD-10-CM

## 2016-08-04 DIAGNOSIS — R945 Abnormal results of liver function studies: Secondary | ICD-10-CM

## 2016-08-04 DIAGNOSIS — R5381 Other malaise: Secondary | ICD-10-CM | POA: Diagnosis not present

## 2016-08-04 NOTE — Addendum Note (Signed)
Addended by: Polly Cobia on: 08/04/2016 11:38 AM   Modules accepted: Orders

## 2016-08-05 LAB — CBC WITH DIFFERENTIAL/PLATELET
BASOS: 1 %
Basophils Absolute: 0.1 10*3/uL (ref 0.0–0.2)
EOS (ABSOLUTE): 0.1 10*3/uL (ref 0.0–0.4)
EOS: 1 %
HEMATOCRIT: 35.1 % (ref 34.0–46.6)
Hemoglobin: 11.7 g/dL (ref 11.1–15.9)
IMMATURE GRANULOCYTES: 0 %
Immature Grans (Abs): 0 10*3/uL (ref 0.0–0.1)
LYMPHS ABS: 4.3 10*3/uL — AB (ref 0.7–3.1)
Lymphs: 52 %
MCH: 30.5 pg (ref 26.6–33.0)
MCHC: 33.3 g/dL (ref 31.5–35.7)
MCV: 92 fL (ref 79–97)
Monocytes Absolute: 1 10*3/uL — ABNORMAL HIGH (ref 0.1–0.9)
Monocytes: 13 %
NEUTROS PCT: 33 %
Neutrophils Absolute: 2.7 10*3/uL (ref 1.4–7.0)
Platelets: 229 10*3/uL (ref 150–379)
RBC: 3.83 x10E6/uL (ref 3.77–5.28)
RDW: 16.3 % — AB (ref 12.3–15.4)
WBC: 8.1 10*3/uL (ref 3.4–10.8)

## 2016-08-05 LAB — HEPATIC FUNCTION PANEL
ALT: 13 IU/L (ref 0–32)
AST: 22 IU/L (ref 0–40)
Albumin: 4.3 g/dL (ref 3.5–4.8)
Alkaline Phosphatase: 60 IU/L (ref 39–117)
Bilirubin Total: <0.2 mg/dL (ref 0.0–1.2)
Bilirubin, Direct: 0.08 mg/dL (ref 0.00–0.40)
TOTAL PROTEIN: 6.6 g/dL (ref 6.0–8.5)

## 2016-08-09 ENCOUNTER — Ambulatory Visit (HOSPITAL_COMMUNITY): Payer: Medicare Other

## 2016-08-10 ENCOUNTER — Telehealth: Payer: Self-pay | Admitting: *Deleted

## 2016-08-10 NOTE — Telephone Encounter (Signed)
Returned patient's call regarding her Carotid doppler scheduled 08/09/16.  Left message for her to call back

## 2016-08-14 ENCOUNTER — Encounter (HOSPITAL_COMMUNITY): Payer: Self-pay | Admitting: *Deleted

## 2016-08-14 ENCOUNTER — Ambulatory Visit (HOSPITAL_COMMUNITY)
Admission: EM | Admit: 2016-08-14 | Discharge: 2016-08-14 | Disposition: A | Discharge status: Home / Self Care | Payer: Medicare Other | Attending: Family Medicine | Admitting: Family Medicine

## 2016-08-14 DIAGNOSIS — S61411A Laceration without foreign body of right hand, initial encounter: Secondary | ICD-10-CM

## 2016-08-14 DIAGNOSIS — M25551 Pain in right hip: Secondary | ICD-10-CM

## 2016-08-14 DIAGNOSIS — T148XXA Other injury of unspecified body region, initial encounter: Secondary | ICD-10-CM | POA: Diagnosis not present

## 2016-08-14 DIAGNOSIS — Z23 Encounter for immunization: Secondary | ICD-10-CM | POA: Diagnosis not present

## 2016-08-14 DIAGNOSIS — W19XXXA Unspecified fall, initial encounter: Secondary | ICD-10-CM | POA: Diagnosis not present

## 2016-08-14 MED ORDER — TETANUS-DIPHTH-ACELL PERTUSSIS 5-2.5-18.5 LF-MCG/0.5 IM SUSP
INTRAMUSCULAR | Status: AC
  Filled 2016-08-14: qty 0.5

## 2016-08-14 MED ORDER — TETANUS-DIPHTH-ACELL PERTUSSIS 5-2.5-18.5 LF-MCG/0.5 IM SUSP
0.5000 mL | Freq: Once | INTRAMUSCULAR | Status: AC
  Administered 2016-08-14: 0.5 mL via INTRAMUSCULAR

## 2016-08-14 NOTE — ED Provider Notes (Signed)
CSN: [id number]     Arrival date & time 08/14/16  1759 History   First MD Initiated Contact with Patient 08/14/16 1841     Chief Complaint  Patient presents with  . Fall   (Consider location/radiation/quality/duration/timing/severity/associated sxs/prior Treatment) Patient was watering the flowers in her patio and got tangled in her garden and fell on the ground and hit her head on the brick. She takes 81 mg aspirin daily, did not take it today. She denies LOC. Does have headache over her right eyebrow at the site of the hematoma. She denies dizziness. She was able to get up unassisted immediately. She also complaints of skin injury to right hand and pain at right hip. She is ambulatory.         Past Medical History:  Diagnosis Date  . COPD (chronic obstructive pulmonary disease) ([place])   . Hx of colonic polyps   . Hypercholesterolemia   . Hypothyroidism   . Melanoma ([place])    lt upper thigh  . 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 MVP (mitral valve prolapse)   . Osteopenia   . Prolapsed uterus   . Varicose vein    Past Surgical History:  Procedure Laterality Date  . 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 Patient does not remember exact date of surgery.  . [place]  . COLONOSCOPY    . MELANOMA EXCISION  12/31/2010   Procedure: MELANOMA EXCISION;  Surgeon: Zenovia Jarred, MD;  Location: [facility];  Service: General;  Laterality: Left;  wide excision melanoma left thigh, excision lesion left thigh  . OTHER SURGICAL HISTORY Right    Surgical repair of architectual distortion Right Breast- Hyperplasia  . TONSILLECTOMY     Family History  Problem Relation Age of Onset  . Hypertension Mother   . Congestive Heart Failure Mother   . Thyroid disease Mother   . Stroke Father   . Heart attack Father 45  . Diabetes Paternal Grandmother   . Diabetes Paternal Grandfather   . Hypertension  Maternal Grandfather   . CVA Maternal Grandfather   . Esophageal cancer Neg Hx   . Rectal cancer Neg Hx   . Stomach cancer Neg Hx   . Colon cancer Neg Hx    Social History  Substance Use Topics  . Smoking status: Former Smoker    Packs/day: 1.00    Years: 50.00    Types: Cigarettes    Start date: 03/08/1960    Quit date: 07/23/2010  . Smokeless tobacco: Never Used     Comment: quit smoking 3 years ago  . Alcohol use No   OB History    Gravida Para Term Preterm AB Living   1 1 1     1    SAB TAB Ectopic Multiple Live Births                 Review of Systems  Constitutional:       See HPI    Allergies  Codeine; Ciprofloxacin; and Sulfur  Home Medications   Prior to Admission medications   Medication Sig Start Date End Date Taking? Authorizing Provider  acyclovir ointment (ZOVIRAX) 5 % See admin instructions. Prn cold sores 12/04/14   [provider]  aspirin 81 MG tablet Take 81 mg by mouth daily.      [provider]  AZO-CRANBERRY PO Take by mouth  daily.    [provider]  Calcium Carbonate-Vitamin D (CALCIUM + D PO) Take by mouth daily.      [provider]  Cholecalciferol (VITAMIN D) 2000 UNITS CAPS Take 1 capsule by mouth daily.    [provider]  cimetidine (TAGAMET) 400 MG tablet Take 400 mg by mouth as needed. 05/04/13   Noralee Space, MD  conjugated estrogens (PREMARIN) vaginal cream Use 1/2 g vaginally with each pessary cleaning. 04/09/15   Yisroel Ramming, Everardo All, MD  fluticasone [facility]) 50 MCG/ACT nasal spray PLACE 2 SPRAYS INTO BOTH NOSTRILS AS NEEDED. 06/14/16   Noralee Space, MD  levothyroxine (SYNTHROID, LEVOTHROID) 88 MCG tablet Take 1 tablet (88 mcg total) by mouth daily before breakfast. 06/14/16   Noralee Space, MD  LUTEIN PO Take 1 tablet by mouth daily.    [provider]  Multiple Vitamin (MULTIVITAMIN) tablet Take 1 tablet by mouth daily.      [provider]  nicotine polacrilex  (NICORETTE) 2 MG gum Take 2 mg by mouth as needed. Not smoking    [provider]   Meds Ordered and Administered this Visit  Medications - No data to display  BP (!) 179/84 (BP Location: Left Arm)   Pulse 78   Temp 98.6 F (37 C) (Oral)   Resp 18   LMP 02/16/1988 (Approximate)   SpO2 100%  No data found.   Physical Exam  Constitutional: She is oriented to person, place, and time. She appears well-developed and well-nourished.  HENT:  Head: Normocephalic and atraumatic.  Right Ear: External ear normal.  Left Ear: External ear normal.  Nose: Nose normal.  Mouth/Throat: Oropharynx is clear and moist. No oropharyngeal exudate.  Eyes: Conjunctivae are normal. Pupils are equal, round, and reactive to light.  Neck: Normal range of motion. Neck supple.  Cardiovascular: Normal rate, regular rhythm and normal heart sounds.   Pulmonary/Chest: Effort normal and breath sounds normal.  Abdominal: Soft. Bowel sounds are normal. There is no tenderness.  Musculoskeletal: Normal range of motion.  Right hip has full ROM. ambulatory  Neurological: She is alert and oriented to person, place, and time. No cranial nerve deficit or sensory deficit. She exhibits normal muscle tone. Coordination normal.  Skin: Skin is warm and dry.  Has skin tear/laceration measures 5 cm, bleeding controlled. Sensation intact.  Has a hematoma measures 4 cm above right eyebrow.   Psychiatric: She has a normal mood and affect.  Nursing note and vitals reviewed.   Urgent Care Course     .Marland KitchenLaceration Repair Date/Time: 08/14/2016 7:04 PM Performed by: Barry Dienes Authorized by: Vanessa Kick   Consent:    Consent obtained:  Verbal   Consent given by:  Patient   Risks discussed:  Poor wound healing   Alternatives discussed:  No treatment Anesthesia (see MAR for exact dosages):    Anesthesia method:  None Laceration details:    Location:  Hand   Hand location:  R hand, dorsum   Length (cm):  5    Depth (mm):  1 Repair type:    Repair type:  Simple Pre-procedure details:    Preparation:  Patient was prepped and draped in usual sterile fashion Exploration:    Hemostasis achieved with:  Direct pressure   Wound exploration: entire depth of wound probed and visualized     Wound extent: no foreign bodies/material noted     Contaminated: no   Treatment:    Wound cleansed with: derma  wound cleanser. Skin repair:    Repair method:  Tissue adhesive Approximation:    Approximation:  Close   Vermilion border: well-aligned   Post-procedure details:    Dressing:  Sterile dressing   (including critical care time)  Labs Review Labs Reviewed - No data to display  Imaging Review No results found.  MDM   1. Fall, initial encounter   2. Laceration of right hand without foreign body, initial encounter   3. Right hip pain   4. Hematoma    Laceration/skin tear repaired using dermabond; patient tolerated well; wound care discussed.   Hip pain: Exam unremarkable with full ROM. Please take tylenol for pain. No clinical indication for diagnostic imaging study at the moment.   Hematoma: Has headache/pain over this area, which is expected. Neurologically she is intact.   Patient is stable to be discharged home. ER precaution strictly discussed today.     Barry Dienes, NP 08/14/16 1905

## 2016-08-14 NOTE — ED Triage Notes (Signed)
Pt  Reports   She   Fell      From  A   Standing  posistion    When  Her  Feet  Got  Tangled   In  Garden hose    Lac  To  r  Hand     Hematoma  r  forehead    And  Pain  In r  Hip    You   Did  Not  Black  Out    And    Was  Able  To   Ambulate   No  Vomiting

## 2016-08-30 ENCOUNTER — Ambulatory Visit (HOSPITAL_COMMUNITY)
Admission: RE | Admit: 2016-08-30 | Discharge: 2016-08-30 | Disposition: A | Discharge status: Home / Self Care | Payer: Medicare Other | Source: Ambulatory Visit | Attending: Cardiology | Admitting: Cardiology

## 2016-08-30 DIAGNOSIS — I6523 Occlusion and stenosis of bilateral carotid arteries: Secondary | ICD-10-CM | POA: Diagnosis not present

## 2016-09-01 ENCOUNTER — Encounter: Payer: Self-pay | Admitting: *Deleted

## 2016-09-03 ENCOUNTER — Other Ambulatory Visit: Payer: Self-pay | Admitting: Pulmonary Disease

## 2016-09-08 ENCOUNTER — Other Ambulatory Visit: Payer: Self-pay | Admitting: Adult Health

## 2016-09-08 ENCOUNTER — Ambulatory Visit: Payer: Medicare Other | Admitting: Obstetrics and Gynecology

## 2016-09-10 ENCOUNTER — Encounter: Payer: Self-pay | Admitting: Obstetrics and Gynecology

## 2016-09-10 ENCOUNTER — Ambulatory Visit (INDEPENDENT_AMBULATORY_CARE_PROVIDER_SITE_OTHER): Payer: Medicare Other | Admitting: Obstetrics and Gynecology

## 2016-09-10 VITALS — BP 142/70 | HR 76 | Resp 16 | Wt 117.0 lb

## 2016-09-10 DIAGNOSIS — N812 Incomplete uterovaginal prolapse: Secondary | ICD-10-CM

## 2016-09-10 DIAGNOSIS — N760 Acute vaginitis: Secondary | ICD-10-CM

## 2016-09-10 DIAGNOSIS — T8369XD Infection and inflammatory reaction due to other prosthetic device, implant and graft in genital tract, subsequent encounter: Secondary | ICD-10-CM | POA: Diagnosis not present

## 2016-09-10 NOTE — Progress Notes (Signed)
GYNECOLOGY  VISIT   HPI: 76 y.o.   Widowed  Caucasian  female   G1P1001 with Patient's last menstrual period was 02/16/1988 (approximate).   here for  4 week recheck.  [place] on a brick terrace from a garden hose.  No broken bones.  Thinks she blacked out.  Had a black eye and bruising on her right side.  Went to urgent care to be seen.  Received TDap.  Taking cranberry pills daily for UTI prevention.  Pessary doing well.  Little discharge but no bloody discharge.   Going to [place] on August 8.    GYNECOLOGIC HISTORY: Patient's last menstrual period was 02/16/1988 (approximate). Contraception:  Postmenopausal Menopausal hormone therapy:  Premarin vaginal cream Last mammogram:  01/01/16 BIRADS 2 negative/density b Last pap smear:   2012 normal        OB History    Gravida Para Term Preterm AB Living   1 1 1     1    SAB TAB Ectopic Multiple Live Births                     Patient Active Problem List   Diagnosis Date Noted  . Vertigo 04/02/2016  . Acute upper respiratory infection 12/01/2015  . Right carotid bruit 08/01/2015  . Uterine prolapse 06/13/2013  . Cystocele 06/13/2013  . Ex-cigarette smoker 05/04/2013  . Asthmatic bronchitis 05/04/2013  . 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 ESOPHAGEAL REFLUX 03/24/2010  . 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 HYPERCHOLESTEROLEMIA, BORDERLINE, WITH HIGH HDL 01/18/2008  . Disorder of bone and cartilage 01/18/2008  . COLONIC POLYPS 01/17/2008  . Hypothyroidism 01/17/2008  . Anxiety state 01/17/2008  . History of cardiovascular disorder 01/17/2008    Past Medical History:  Diagnosis Date  . COPD (chronic obstructive pulmonary disease) ([place])   . Hx of colonic polyps   . Hypercholesterolemia   . Hypothyroidism   . Melanoma ([place])    lt upper thigh  . 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 MVP (mitral valve prolapse)   . Osteopenia   . Prolapsed uterus   . Varicose vein     Past  Surgical History:  Procedure Laterality Date  . 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 Patient does not remember exact date of surgery.  . [place]  . COLONOSCOPY    . MELANOMA EXCISION  12/31/2010   Procedure: MELANOMA EXCISION;  Surgeon: Zenovia Jarred, MD;  Location: [facility];  Service: General;  Laterality: Left;  wide excision melanoma left thigh, excision lesion left thigh  . OTHER SURGICAL HISTORY Right    Surgical repair of architectual distortion Right Breast- Hyperplasia  . TONSILLECTOMY      Current Outpatient Prescriptions  Medication Sig Dispense Refill  . acyclovir ointment (ZOVIRAX) 5 % See admin instructions. Prn cold sores  0  . aspirin 81 MG tablet Take 81 mg by mouth daily.      . AZO-CRANBERRY PO Take by mouth daily.    . Calcium Carbonate-Vitamin D (CALCIUM + D PO) Take by mouth daily.      . Cholecalciferol (VITAMIN D) 2000 UNITS CAPS Take 1 capsule by mouth daily.    . cimetidine (TAGAMET) 400 MG tablet Take 400 mg by mouth as needed.    . cimetidine (TAGAMET) 400 MG tablet TAKE 1 TABLET TWICE A DAY 60 tablet 0  .  conjugated estrogens (PREMARIN) vaginal cream Use 1/2 g vaginally with each pessary cleaning. 30 g 1  . fluticasone (FLONASE) 50 MCG/ACT nasal spray PLACE 2 SPRAYS INTO BOTH NOSTRILS AS NEEDED. 16 g 5  . levothyroxine (SYNTHROID, LEVOTHROID) 88 MCG tablet Take 1 tablet (88 mcg total) by mouth daily before breakfast. 30 tablet 11  . levothyroxine (SYNTHROID, LEVOTHROID) 88 MCG tablet TAKE 1 TABLET (88 MCG TOTAL) BY MOUTH DAILY BEFORE BREAKFAST. 30 tablet 5  . LUTEIN PO Take 1 tablet by mouth daily.    . Multiple Vitamin (MULTIVITAMIN) tablet Take 1 tablet by mouth daily.      . nicotine polacrilex (NICORETTE) 2 MG gum Take 2 mg by mouth as needed. Not smoking     No current facility-administered medications for this visit.      ALLERGIES: Codeine; Ciprofloxacin; and Sulfur  Family History   Problem Relation Age of Onset  . Hypertension Mother   . Congestive Heart Failure Mother   . Thyroid disease Mother   . Stroke Father   . Heart attack Father 22  . Diabetes Paternal Grandmother   . Diabetes Paternal Grandfather   . Hypertension Maternal Grandfather   . CVA Maternal Grandfather   . Esophageal cancer Neg Hx   . Rectal cancer Neg Hx   . Stomach cancer Neg Hx   . Colon cancer Neg Hx     Social History   Social History  . Marital status: Widowed    Spouse name: fred Eanes  . Number of children: N/A  . Years of education: N/A   Occupational History  . Not on file.   Social History Main Topics  . Smoking status: Former Smoker    Packs/day: 1.00    Years: 50.00    Types: Cigarettes    Start date: 03/08/1960    Quit date: 07/23/2010  . Smokeless tobacco: Never Used     Comment: quit smoking 3 years ago  . Alcohol use No  . Drug use: No  . Sexual activity: No   Other Topics Concern  . Not on file   Social History Narrative  . No narrative on file    ROS:  Pertinent items are noted in HPI.  PHYSICAL EXAMINATION:    BP (!) 142/70 (BP Location: Right Arm, Patient Position: Sitting, Cuff Size: Normal)   Pulse 76   Resp 16   Wt 117 lb (53.1 kg)   LMP 02/16/1988 (Approximate)   BMI 21.40 kg/m     General appearance: alert, cooperative and appears stated age   Pelvic: External genitalia:  no lesions              Urethra:  normal appearing urethra with no masses, tenderness or lesions              Bartholins and Skenes: normal                 Vagina:  Erythema of the posterior vaginal cuff - bleeds slightly.              Cervix: no lesions    Pessary removed, cleansed and replaced with Premarin 1.5 grams.   Chaperone was present for exam.  ASSESSMENT  Incomplete uterovaginal prolapse.  Vaginal inflammation from pessary use.  Recent fall.  No fractures.   PLAN  Continue pessary use.  Will give an Rx for compounded estradiol cream for her  next rx of vaginal estrogen cream. Follow up in 5 weeks.   An After Visit  Summary was printed and given to the patient.  ___15___ minutes face to face time of which over 50% was spent in counseling.

## 2016-09-13 ENCOUNTER — Ambulatory Visit (INDEPENDENT_AMBULATORY_CARE_PROVIDER_SITE_OTHER): Payer: Medicare Other | Admitting: Pulmonary Disease

## 2016-09-13 ENCOUNTER — Encounter: Payer: Self-pay | Admitting: Pulmonary Disease

## 2016-09-13 VITALS — BP 144/68 | HR 62 | Temp 98.2°F | Ht 62.0 in | Wt 116.0 lb

## 2016-09-13 DIAGNOSIS — T8090XA Unspecified complication following infusion and therapeutic injection, initial encounter: Secondary | ICD-10-CM | POA: Diagnosis not present

## 2016-09-13 DIAGNOSIS — R0609 Other forms of dyspnea: Secondary | ICD-10-CM

## 2016-09-13 NOTE — Patient Instructions (Signed)
Injection site reaction: Please let us know if the redness in your arm gets worse over the next few days  Sensation of fast heart rate and shortness of breath: Today on physical exam everything seemed to be normal We will check a walking test in the office to make sure your oxygen levels okay If you have the symptom again in the next few days please let us know  Otherwise, follow-up with Dr. Lenna Gilford as previously arranged

## 2016-09-13 NOTE — Progress Notes (Signed)
Subjective:    Patient ID: Ariel Holmes, female    DOB: March 05, 1940, 76 y.o.   MRN: [id number]  HPI Chief Complaint  Patient presents with  . Acute Visit    SN pt- had 2nd shingles vaccine on Saturday AM, is having a local reaction at inj site-     This is a pleasant 76 year old female with a past medical history significant for COPD who sees my partner for the same and primary care. She presents today because she had a shingles vaccine 3 days ago and since then she's been having redness and some mild pain just below the site of injection. She treated it yesterday with ibuprofen and a heating pad. She says that today the redness has been improving. She does not have pain. She does not have fever.  This morning she had the sudden onset of shortness of breath and a fast heart rate while she was feeling quite anxious. This is since resolved and has not occurred since then. She says she now feels well.  Past Medical History:  Diagnosis Date  . COPD (chronic obstructive pulmonary disease) ([place])   . Hx of colonic polyps   . Hypercholesterolemia   . Hypothyroidism   . Melanoma ([place])    lt upper thigh  . 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 MVP (mitral valve prolapse)   . Osteopenia   . Prolapsed uterus   . Varicose vein       Review of Systems  Constitutional: Negative for chills, fatigue and fever.  HENT: Negative for rhinorrhea, sinus pain and sinus pressure.   Respiratory: Positive for shortness of breath. Negative for cough and stridor.   Cardiovascular: Negative for chest pain, palpitations and leg swelling.       Objective:   Physical Exam Vitals:   09/13/16 1338  BP: (!) 144/68  Pulse: 62  Temp: 98.2 F (36.8 C)  TempSrc: Oral  SpO2: 100%  Weight: 116 lb (52.6 kg)  Height: 5\' 2"  (1.575 m)   RA  Gen: well appearing HENT: OP clear, TM's clear, neck supple PULM: CTA B, normal percussion CV: RRR, no mgr, trace  edema GI: BS+, soft, nontender Derm: Mild redness left arm just under shoulder, no fluctuance, no tenderness to palpation, no warmth.  Psyche: normal mood and affect        Assessment & Plan:  Injection site reaction, initial encounter  Dyspnea on exertion  Discussion: Ariel Holmes presents for evaluation of an injection site reaction. There is no signs or symptoms to suggest that it is infected. I have suggested that she continue to monitor it. Given the significant improvement throughout the day it should continue to get better.  She had anxiety associated with a fast heart rate this morning. This has resolved. We will check ambulatory oximetry today. If the problem worsens then she can come back and we can work this up more.  Plan: Injection site reaction: Please let us know if the redness in your arm gets worse over the next few days  Sensation of fast heart rate and shortness of breath: Today on physical exam everything seemed to be normal We will check a walking test in the office to make sure your oxygen levels okay If you have the symptom again in the next few days please let us know  Otherwise, follow-up with Dr. Lenna Holmes as previously arranged   Current Outpatient Prescriptions:  .  acyclovir  ointment (ZOVIRAX) 5 %, See admin instructions. Prn cold sores, Disp: , Rfl: 0 .  aspirin 81 MG tablet, Take 81 mg by mouth daily.  , Disp: , Rfl:  .  AZO-CRANBERRY PO, Take by mouth daily., Disp: , Rfl:  .  Calcium Carbonate-Vitamin D (CALCIUM + D PO), Take by mouth daily.  , Disp: , Rfl:  .  Cholecalciferol (VITAMIN D) 2000 UNITS CAPS, Take 1 capsule by mouth daily., Disp: , Rfl:  .  cimetidine (TAGAMET) 400 MG tablet, TAKE 1 TABLET TWICE A DAY, Disp: 60 tablet, Rfl: 0 .  conjugated estrogens (PREMARIN) vaginal cream, Use 1/2 g vaginally with each pessary cleaning., Disp: 30 g, Rfl: 1 .  fluticasone (FLONASE) 50 MCG/ACT nasal spray, PLACE 2 SPRAYS INTO BOTH NOSTRILS AS NEEDED., Disp: 16  g, Rfl: 5 .  levothyroxine (SYNTHROID, LEVOTHROID) 88 MCG tablet, Take 1 tablet (88 mcg total) by mouth daily before breakfast., Disp: 30 tablet, Rfl: 11 .  LUTEIN PO, Take 1 tablet by mouth daily., Disp: , Rfl:  .  Multiple Vitamin (MULTIVITAMIN) tablet, Take 1 tablet by mouth daily.  , Disp: , Rfl:  .  nicotine polacrilex (NICORETTE) 2 MG gum, Take 2 mg by mouth as needed. Not smoking, Disp: , Rfl:

## 2016-10-09 ENCOUNTER — Other Ambulatory Visit: Payer: Self-pay | Admitting: Pulmonary Disease

## 2016-10-13 ENCOUNTER — Ambulatory Visit (INDEPENDENT_AMBULATORY_CARE_PROVIDER_SITE_OTHER): Payer: Medicare Other | Admitting: Obstetrics and Gynecology

## 2016-10-13 ENCOUNTER — Encounter: Payer: Self-pay | Admitting: Obstetrics and Gynecology

## 2016-10-13 VITALS — BP 116/60 | HR 64 | Resp 16 | Wt 117.0 lb

## 2016-10-13 DIAGNOSIS — N812 Incomplete uterovaginal prolapse: Secondary | ICD-10-CM

## 2016-10-13 NOTE — Progress Notes (Signed)
GYNECOLOGY  VISIT   HPI: 76 y.o.   Widowed  Caucasian  female   G1P1001 with Patient's last menstrual period was 02/16/1988 (approximate).   here for 5 week recheck.  Some discharge.  No vaginal bleeding or pain/cramping.  No bladder problems.  Back from a cruise to [place] and loved it!  GYNECOLOGIC HISTORY: Patient's last menstrual period was 02/16/1988 (approximate). Contraception:  Postmenopausal Menopausal hormone therapy:  Premarin vaginal cream Last mammogram:  01/01/16 BIRADS 2 negative/density b Last pap smear: 2012 normal        OB History    Gravida Para Term Preterm AB Living   1 1 1     1    SAB TAB Ectopic Multiple Live Births                     Patient Active Problem List   Diagnosis Date Noted  . Vertigo 04/02/2016  . Acute upper respiratory infection 12/01/2015  . Right carotid bruit 08/01/2015  . Uterine prolapse 06/13/2013  . Cystocele 06/13/2013  . Ex-cigarette smoker 05/04/2013  . Asthmatic bronchitis 05/04/2013  . 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 ESOPHAGEAL REFLUX 03/24/2010  . 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 HYPERCHOLESTEROLEMIA, BORDERLINE, WITH HIGH HDL 01/18/2008  . Disorder of bone and cartilage 01/18/2008  . COLONIC POLYPS 01/17/2008  . Hypothyroidism 01/17/2008  . Anxiety state 01/17/2008  . History of cardiovascular disorder 01/17/2008    Past Medical History:  Diagnosis Date  . COPD (chronic obstructive pulmonary disease) ([place])   . Hx of colonic polyps   . Hypercholesterolemia   . Hypothyroidism   . Melanoma ([place])    lt upper thigh  . 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 MVP (mitral valve prolapse)   . Osteopenia   . Prolapsed uterus   . Varicose vein     Past Surgical History:  Procedure Laterality Date  . 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 Patient does not remember exact date of surgery.  . [place]  . COLONOSCOPY    . MELANOMA  EXCISION  12/31/2010   Procedure: MELANOMA EXCISION;  Surgeon: Zenovia Jarred, MD;  Location: [facility];  Service: General;  Laterality: Left;  wide excision melanoma left thigh, excision lesion left thigh  . OTHER SURGICAL HISTORY Right    Surgical repair of architectual distortion Right Breast- Hyperplasia  . TONSILLECTOMY      Current Outpatient Prescriptions  Medication Sig Dispense Refill  . acyclovir ointment (ZOVIRAX) 5 % See admin instructions. Prn cold sores  0  . aspirin 81 MG tablet Take 81 mg by mouth daily.      . AZO-CRANBERRY PO Take by mouth daily.    . Calcium Carbonate-Vitamin D (CALCIUM + D PO) Take by mouth daily.      . Cholecalciferol (VITAMIN D) 2000 UNITS CAPS Take 1 capsule by mouth daily.    . cimetidine (TAGAMET) 400 MG tablet TAKE 1 TABLET BY MOUTH TWICE A DAY 60 tablet 0  . conjugated estrogens (PREMARIN) vaginal cream Use 1/2 g vaginally with each pessary cleaning. 30 g 1  . fluticasone (FLONASE) 50 MCG/ACT nasal spray PLACE 2 SPRAYS INTO BOTH NOSTRILS AS NEEDED. 16 g 5  . levothyroxine (SYNTHROID, LEVOTHROID) 88 MCG tablet Take 1 tablet (88 mcg total) by mouth daily before breakfast. 30 tablet 11  . LUTEIN PO Take 1 tablet  by mouth daily.    . Multiple Vitamin (MULTIVITAMIN) tablet Take 1 tablet by mouth daily.      . nicotine polacrilex (NICORETTE) 2 MG gum Take 2 mg by mouth as needed. Not smoking     No current facility-administered medications for this visit.      ALLERGIES: Codeine; Ciprofloxacin; and Sulfur  Family History  Problem Relation Age of Onset  . Hypertension Mother   . Congestive Heart Failure Mother   . Thyroid disease Mother   . Stroke Father   . Heart attack Father 61  . Diabetes Paternal Grandmother   . Diabetes Paternal Grandfather   . Hypertension Maternal Grandfather   . CVA Maternal Grandfather   . Esophageal cancer Neg Hx   . Rectal cancer Neg Hx   . Stomach cancer Neg Hx   . Colon cancer Neg Hx      Social History   Social History  . Marital status: Widowed    Spouse name: fred Livers  . Number of children: N/A  . Years of education: N/A   Occupational History  . Not on file.   Social History Main Topics  . Smoking status: Former Smoker    Packs/day: 1.00    Years: 50.00    Types: Cigarettes    Start date: 03/08/1960    Quit date: 07/23/2010  . Smokeless tobacco: Never Used     Comment: quit smoking 3 years ago  . Alcohol use No  . Drug use: No  . Sexual activity: No   Other Topics Concern  . Not on file   Social History Narrative  . No narrative on file    ROS:  Pertinent items are noted in HPI.  PHYSICAL EXAMINATION:    BP 116/60 (BP Location: Right Arm, Patient Position: Sitting, Cuff Size: Normal)   Pulse 64   Resp 16   Wt 117 lb (53.1 kg)   LMP 02/16/1988 (Approximate)   BMI 21.40 kg/m     General appearance: alert, cooperative and appears stated age  Pelvic: External genitalia:  no lesions              Urethra:  normal appearing urethra with no masses, tenderness or lesions              Bartholins and Skenes: normal                 Vagina:  3 cm area of erythema of posterior vaginal cuff (stable).              Cervix: no lesions   Pessary removed, cleansed, and replaced.   Chaperone was present for exam.  ASSESSMENT  Incomplete uterovaginal prolapse.  Stable vaginal inflammation from pessary use.   PLAN  Ok to continue pessary.  Return in about 5 weeks for pessary care.   An After Visit Summary was printed and given to the patient.  ______ minutes face to face time of which over 50% was spent in counseling.

## 2016-11-17 ENCOUNTER — Ambulatory Visit: Payer: Medicare Other | Admitting: Obstetrics and Gynecology

## 2016-11-19 ENCOUNTER — Ambulatory Visit (INDEPENDENT_AMBULATORY_CARE_PROVIDER_SITE_OTHER): Payer: Medicare Other | Admitting: Obstetrics and Gynecology

## 2016-11-19 VITALS — BP 124/70 | HR 64 | Ht 62.0 in | Wt 117.0 lb

## 2016-11-19 DIAGNOSIS — N812 Incomplete uterovaginal prolapse: Secondary | ICD-10-CM | POA: Diagnosis not present

## 2016-11-19 DIAGNOSIS — N898 Other specified noninflammatory disorders of vagina: Secondary | ICD-10-CM

## 2016-11-19 MED ORDER — NONFORMULARY OR COMPOUNDED ITEM: 10 refills | Status: DC

## 2016-11-19 NOTE — Progress Notes (Signed)
GYNECOLOGY  VISIT   HPI: 76 y.o.   Widowed  Caucasian  female   G1P1001 with Patient's last menstrual period was 02/16/1988 (approximate).   here for pessary check.    No problems.  Denies vaginal bleeding.   GYNECOLOGIC HISTORY: Patient's last menstrual period was 02/16/1988 (approximate). Contraception:  Postmenopausal Menopausal hormone therapy: Premarin vaginal cream Last mammogram:01/01/16 BIRADS 2 negative/density b.  [facility].  Last pap smear: 2012 normal          OB History    Gravida Para Term Preterm AB Living   1 1 1     1    SAB TAB Ectopic Multiple Live Births                     Patient Active Problem List   Diagnosis Date Noted  . Vertigo 04/02/2016  . Acute upper respiratory infection 12/01/2015  . Right carotid bruit 08/01/2015  . Uterine prolapse 06/13/2013  . Cystocele 06/13/2013  . Ex-cigarette smoker 05/04/2013  . Asthmatic bronchitis 05/04/2013  . 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 ESOPHAGEAL REFLUX 03/24/2010  . 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 HYPERCHOLESTEROLEMIA, BORDERLINE, WITH HIGH HDL 01/18/2008  . Disorder of bone and cartilage 01/18/2008  . COLONIC POLYPS 01/17/2008  . Hypothyroidism 01/17/2008  . Anxiety state 01/17/2008  . History of cardiovascular disorder 01/17/2008    Past Medical History:  Diagnosis Date  . COPD (chronic obstructive pulmonary disease) ([place])   . Hx of colonic polyps   . Hypercholesterolemia   . Hypothyroidism   . Melanoma ([place])    lt upper thigh  . 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 MVP (mitral valve prolapse)   . Osteopenia   . Prolapsed uterus   . Varicose vein     Past Surgical History:  Procedure Laterality Date  . 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 Patient does not remember exact date of surgery.  . [place]  . COLONOSCOPY    . MELANOMA EXCISION  12/31/2010   Procedure: MELANOMA EXCISION;  Surgeon: Zenovia Jarred, MD;  Location: [facility];  Service: General;  Laterality: Left;  wide excision melanoma left thigh, excision lesion left thigh  . OTHER SURGICAL HISTORY Right    Surgical repair of architectual distortion Right Breast- Hyperplasia  . TONSILLECTOMY      Current Outpatient Prescriptions  Medication Sig Dispense Refill  . acyclovir ointment (ZOVIRAX) 5 % See admin instructions. Prn cold sores  0  . aspirin 81 MG tablet Take 81 mg by mouth daily.      . AZO-CRANBERRY PO Take by mouth daily.    . Calcium Carbonate-Vitamin D (CALCIUM + D PO) Take by mouth daily.      . Cholecalciferol (VITAMIN D) 2000 UNITS CAPS Take 1 capsule by mouth daily.    . cimetidine (TAGAMET) 400 MG tablet TAKE 1 TABLET BY MOUTH TWICE A DAY 60 tablet 0  . conjugated estrogens (PREMARIN) vaginal cream Use 1/2 g vaginally with each pessary cleaning. 30 g 1  . fluticasone (FLONASE) 50 MCG/ACT nasal spray PLACE 2 SPRAYS INTO BOTH NOSTRILS AS NEEDED. 16 g 5  . levothyroxine (SYNTHROID, LEVOTHROID) 88 MCG tablet Take 1 tablet (88 mcg total) by mouth daily before breakfast. 30 tablet 11  . LUTEIN PO Take 1 tablet by mouth daily.    . Multiple Vitamin (MULTIVITAMIN) tablet Take  1 tablet by mouth daily.      . nicotine polacrilex (NICORETTE) 2 MG gum Take 2 mg by mouth as needed. Not smoking     No current facility-administered medications for this visit.      ALLERGIES: Codeine; Ciprofloxacin; and Sulfur  Family History  Problem Relation Age of Onset  . Hypertension Mother   . Congestive Heart Failure Mother   . Thyroid disease Mother   . Stroke Father   . Heart attack Father 32  . Diabetes Paternal Grandmother   . Diabetes Paternal Grandfather   . Hypertension Maternal Grandfather   . CVA Maternal Grandfather   . Esophageal cancer Neg Hx   . Rectal cancer Neg Hx   . Stomach cancer Neg Hx   . Colon cancer Neg Hx     Social History   Social History  . Marital status: Widowed     Spouse name: fred Lengacher  . Number of children: N/A  . Years of education: N/A   Occupational History  . Not on file.   Social History Main Topics  . Smoking status: Former Smoker    Packs/day: 1.00    Years: 50.00    Types: Cigarettes    Start date: 03/08/1960    Quit date: 07/23/2010  . Smokeless tobacco: Never Used     Comment: quit smoking 3 years ago  . Alcohol use No  . Drug use: No  . Sexual activity: No   Other Topics Concern  . Not on file   Social History Narrative  . No narrative on file    ROS:  Pertinent items are noted in HPI.  PHYSICAL EXAMINATION:    BP 124/70 (BP Location: Right Arm, Patient Position: Sitting, Cuff Size: Normal)   Pulse 64   Ht 5\' 2"  (1.575 m)   Wt 117 lb (53.1 kg)   LMP 02/16/1988 (Approximate)   BMI 21.40 kg/m     General appearance: alert, cooperative and appears stated age  Pelvic: External genitalia:  no lesions              Urethra:  normal appearing urethra with no masses, tenderness or lesions              Bartholins and Skenes: normal                 Vagina:  Granulation tissue of posterior vaginal cuff measuring about 4 cm.  Bleeds.               Cervix: no lesions                Bimanual Exam:  Uterus:  normal size, contour, position, consistency, mobility, non-tender              Adnexa: no mass, fullness, tenderness         Pessary removed, cleansed, and placed in a biobag and given to patient.  Premarin 1 gram placed in the vagina by me.       Chaperone was present for exam.  ASSESSMENT  Incomplete uterovaginal prolapse.  Granulation and inflammation from pessary use.   PLAN  Will leave pessary out for about 10 days.  She will use vaginal estradiol cream 1/2 gm pv at hs every other night for 10 days and then reduce to 1/2 gram pv at hs twice weekly.  New compounded Rx to patient.  She will do her mammogram in November.  Follow up in about 10 days.    An  After Visit Summary was printed and given to the  patient.  _15_____ minutes face to face time of which over 50% was spent in counseling.

## 2016-11-20 ENCOUNTER — Encounter: Payer: Self-pay | Admitting: Obstetrics and Gynecology

## 2016-11-22 ENCOUNTER — Telehealth: Payer: Self-pay

## 2016-11-22 NOTE — Telephone Encounter (Signed)
Called patient to verify pharmacy for vaginal estrogen cream. Patient does want this sent to [facility]. Rx faxed to [facility] for compounded vaginal estrogen cream per patient and Dr.Silva.

## 2016-12-02 ENCOUNTER — Ambulatory Visit (INDEPENDENT_AMBULATORY_CARE_PROVIDER_SITE_OTHER): Payer: Medicare Other | Admitting: Obstetrics and Gynecology

## 2016-12-02 ENCOUNTER — Encounter: Payer: Self-pay | Admitting: Obstetrics and Gynecology

## 2016-12-02 VITALS — BP 138/82 | HR 56 | Ht 62.0 in | Wt 117.0 lb

## 2016-12-02 DIAGNOSIS — N812 Incomplete uterovaginal prolapse: Secondary | ICD-10-CM

## 2016-12-02 NOTE — Progress Notes (Signed)
GYNECOLOGY  VISIT   HPI: 76 y.o.   Widowed  Caucasian  female   G1P1001 with Patient's last menstrual period was 02/16/1988 (approximate).   here for 4 week follow up.    Pessary removed on 11/19/16 and left out due to inflammation of the posterior vaginal cuff which measured 4 cm.   Feels the prolapse coming back down.   Now has compounded vaginal estrogen cream.  Brought her Premarin cream tube today.  She has been using the vaginal cream but about every other day.   [employer] went well.   GYNECOLOGIC HISTORY: Patient's last menstrual period was 02/16/1988 (approximate). Contraception:  Postmenopausal Menopausal hormone therapy:  Premarin vaginal cream Last mammogram: 01/01/16 BIRADS 2 negative/density B:[facility] Last pap smear: 2012 Neg        OB History    Gravida Para Term Preterm AB Living   1 1 1     1    SAB TAB Ectopic Multiple Live Births                     Patient Active Problem List   Diagnosis Date Noted  . Vertigo 04/02/2016  . Acute upper respiratory infection 12/01/2015  . Right carotid bruit 08/01/2015  . Uterine prolapse 06/13/2013  . Cystocele 06/13/2013  . Ex-cigarette smoker 05/04/2013  . Asthmatic bronchitis 05/04/2013  . 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 ESOPHAGEAL REFLUX 03/24/2010  . 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 HYPERCHOLESTEROLEMIA, BORDERLINE, WITH HIGH HDL 01/18/2008  . Disorder of bone and cartilage 01/18/2008  . COLONIC POLYPS 01/17/2008  . Hypothyroidism 01/17/2008  . Anxiety state 01/17/2008  . History of cardiovascular disorder 01/17/2008    Past Medical History:  Diagnosis Date  . COPD (chronic obstructive pulmonary disease) ([place])   . Hx of colonic polyps   . Hypercholesterolemia   . Hypothyroidism   . Melanoma ([place])    lt upper thigh  . 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 MVP (mitral valve prolapse)   . Osteopenia   . Prolapsed uterus   . Varicose vein     Past  Surgical History:  Procedure Laterality Date  . 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 Patient does not remember exact date of surgery.  . [place]  . COLONOSCOPY    . MELANOMA EXCISION  12/31/2010   Procedure: MELANOMA EXCISION;  Surgeon: Zenovia Jarred, MD;  Location: [facility];  Service: General;  Laterality: Left;  wide excision melanoma left thigh, excision lesion left thigh  . OTHER SURGICAL HISTORY Right    Surgical repair of architectual distortion Right Breast- Hyperplasia  . TONSILLECTOMY      Current Outpatient Prescriptions  Medication Sig Dispense Refill  . acyclovir ointment (ZOVIRAX) 5 % See admin instructions. Prn cold sores  0  . aspirin 81 MG tablet Take 81 mg by mouth daily.      . AZO-CRANBERRY PO Take by mouth daily.    . Calcium Carbonate-Vitamin D (CALCIUM + D PO) Take by mouth daily.      . Cholecalciferol (VITAMIN D) 2000 UNITS CAPS Take 1 capsule by mouth daily.    . cimetidine (TAGAMET) 400 MG tablet TAKE 1 TABLET BY MOUTH TWICE A DAY 60 tablet 0  . conjugated estrogens (PREMARIN) vaginal cream Use 1/2 g vaginally with each pessary cleaning. 30 g 1  . fluticasone (FLONASE) 50 MCG/ACT  nasal spray PLACE 2 SPRAYS INTO BOTH NOSTRILS AS NEEDED. 16 g 5  . levothyroxine (SYNTHROID, LEVOTHROID) 88 MCG tablet Take 1 tablet (88 mcg total) by mouth daily before breakfast. 30 tablet 11  . LUTEIN PO Take 1 tablet by mouth daily.    . Multiple Vitamin (MULTIVITAMIN) tablet Take 1 tablet by mouth daily.      . nicotine polacrilex (NICORETTE) 2 MG gum Take 2 mg by mouth as needed. Not smoking    . NONFORMULARY OR COMPOUNDED ITEM Vaginal estradiol cream 0.02%.  Place 1/2 gram per vagina at bedtime every other night for 10 days, then place 1/2 gram per vaginal at bedtime twice weekly. 8 each 10   No current facility-administered medications for this visit.      ALLERGIES: Codeine; Ciprofloxacin; and Sulfur  Family History   Problem Relation Age of Onset  . Hypertension Mother   . Congestive Heart Failure Mother   . Thyroid disease Mother   . Stroke Father   . Heart attack Father 57  . Diabetes Paternal Grandmother   . Diabetes Paternal Grandfather   . Hypertension Maternal Grandfather   . CVA Maternal Grandfather   . Esophageal cancer Neg Hx   . Rectal cancer Neg Hx   . Stomach cancer Neg Hx   . Colon cancer Neg Hx     Social History   Social History  . Marital status: Widowed    Spouse name: fred Youse  . Number of children: N/A  . Years of education: N/A   Occupational History  . Not on file.   Social History Main Topics  . Smoking status: Former Smoker    Packs/day: 1.00    Years: 50.00    Types: Cigarettes    Start date: 03/08/1960    Quit date: 07/23/2010  . Smokeless tobacco: Never Used     Comment: quit smoking 3 years ago  . Alcohol use No  . Drug use: No  . Sexual activity: No   Other Topics Concern  . Not on file   Social History Narrative  . No narrative on file    ROS:  Pertinent items are noted in HPI.  PHYSICAL EXAMINATION:    BP 138/82 (BP Location: Right Arm, Patient Position: Sitting, Cuff Size: Normal)   Pulse (!) 56   Ht 5\' 2"  (1.575 m)   Wt 117 lb (53.1 kg)   LMP 02/16/1988 (Approximate)   BMI 21.40 kg/m     General appearance: alert, cooperative and appears stated age   Pelvic: External genitalia:  no lesions              Urethra:  normal appearing urethra with no masses, tenderness or lesions              Bartholins and Skenes: normal                 Vagina: normal appearing vagina with normal color and discharge,  8 mm area of erythema left posterior apex, otherwise no erythema or granulation tissue.              Cervix: no lesions                Pessary placed with Surgilube and Premarin 1/2 gram.   Chaperone was present for exam.  ASSESSMENT  Incomplete uterovaginal prolapse.  Erythema/inflammation of the posterior vagina essentially gone.   This is encouraging that this has resolved so quickly and that we know the patient can tolerate  having the pessary out for 2 weeks. Good pessary candidate.  PLAN  Continue pessary care. Return for pessary check in 4 weeks, sooner if needed.  We talked about the similarities and differences of the estradiol and conjugated estrogen creams.   An After Visit Summary was printed and given to the patient.  __15____ minutes face to face time of which over 50% was spent in counseling.

## 2016-12-03 ENCOUNTER — Ambulatory Visit (INDEPENDENT_AMBULATORY_CARE_PROVIDER_SITE_OTHER): Payer: Medicare Other

## 2016-12-03 DIAGNOSIS — Z23 Encounter for immunization: Secondary | ICD-10-CM | POA: Diagnosis not present

## 2016-12-14 ENCOUNTER — Ambulatory Visit (INDEPENDENT_AMBULATORY_CARE_PROVIDER_SITE_OTHER): Payer: Medicare Other | Admitting: Pulmonary Disease

## 2016-12-14 ENCOUNTER — Encounter: Payer: Self-pay | Admitting: Pulmonary Disease

## 2016-12-14 VITALS — BP 120/70 | HR 60 | Temp 97.3°F | Ht 62.0 in | Wt 115.2 lb

## 2016-12-14 DIAGNOSIS — N814 Uterovaginal prolapse, unspecified: Secondary | ICD-10-CM

## 2016-12-14 DIAGNOSIS — J4520 Mild intermittent asthma, uncomplicated: Secondary | ICD-10-CM | POA: Diagnosis not present

## 2016-12-14 DIAGNOSIS — M949 Disorder of cartilage, unspecified: Secondary | ICD-10-CM | POA: Diagnosis not present

## 2016-12-14 DIAGNOSIS — E039 Hypothyroidism, unspecified: Secondary | ICD-10-CM

## 2016-12-14 DIAGNOSIS — C4372 Malignant melanoma of left lower limb, including hip: Secondary | ICD-10-CM | POA: Diagnosis not present

## 2016-12-14 DIAGNOSIS — M25511 Pain in right shoulder: Secondary | ICD-10-CM

## 2016-12-14 DIAGNOSIS — E7800 Pure hypercholesterolemia, unspecified: Secondary | ICD-10-CM

## 2016-12-14 DIAGNOSIS — F411 Generalized anxiety disorder: Secondary | ICD-10-CM

## 2016-12-14 DIAGNOSIS — D126 Benign neoplasm of colon, unspecified: Secondary | ICD-10-CM | POA: Diagnosis not present

## 2016-12-14 DIAGNOSIS — M899 Disorder of bone, unspecified: Secondary | ICD-10-CM | POA: Diagnosis not present

## 2016-12-14 DIAGNOSIS — K219 Gastro-esophageal reflux disease without esophagitis: Secondary | ICD-10-CM | POA: Diagnosis not present

## 2016-12-14 NOTE — Progress Notes (Addendum)
Subjective:     Patient ID: Ariel Holmes, female   DOB: Feb 03, 1941, 76 y.o.   MRN: [id number]  HPI 76 y/o WF here for a follow up visit... she has multiple medical problems as noted below...   ~  SEE PREV EPIC NOTES FOR OLDER DATA >>    CXR 3/13 showed heart is wnl, lungs clear x sl incr markings, NAD.[name]  LABS 3/13:  FLP- Improved on diet;  Chems- wnl;  CBC- wnl;  TSH=0.71 on Levothy100/d...  LABS 11/13:  Chems- wnl;  CBC- wnl...  CXR 3/14 showed normal heart size, calcif & ectasia of Ao, clear lungs, DJD/ sl scoliosis of spine/ osteopenia...  LABS 3/14:  FLP- not at goals on diet alone but hi HDL;  TSH=0.42;  VitD=60   CXR 3/15 showed norm heart size, clear lungs, mild DJD spine, no lesions/ NAD...  PFT 3/15 showed FVC=2.00 (76%), FEV1=1.56 (77%), %1sec=78%, mid-flows=75% predicted... Interpret: No signif obstruction, can't r/o mild restriction w/o lung vol measurement- SMN  BMD 3/15 showed TScore -0.5 in Spine and -1.4 in [facility]; Rec to continue Calcium, MVI, VitD & exercise...  ~  June 11, 2014:  76mo ROV & [name] has had a rough time since [name] passed away 03-09-14; she works Scientist, research (physical sciences) for [employer] & that has helped keeping her busy,,, she is c/o some nasal congestion, drainage, & blowing beige mucus; draining to her chest but denies hemooptysis, SOB, CP, f/c/s; she is fatigued and achey- using Flonase and Saline... She quit smoking 4 yrs ago & still chews nicorette gum- we discussed weaning this down already! We reviewed the following medical problems during today's office visit >>     AR, AB, ex-smoker> on OTC Antihist, Flonase, & Nicorette; she quit smoking 6/12 but still using Nicotine replacement & we discussed weaning this "medication"...    MVP> on ASA81; she denies CP, palpit, SOB, edema, etc; she is active & doing satis...    Ven Insuffic> she knows to avoid sodium, elev legs, wear support hose...    Chol> on diet alone esp w/ her good HDL; Labs 4/16  showed TChol 196, TG 93, HDL 76, LDL 101    Hypothy> on Synthroid100; Labs 4/16 showed TSH= 0.89; she remains clinically & biochem euthyroid...    GI- GERD, Polyps, Hems> stable w/o abd pain, dysphagia, n/v, c/d, blood seen; last colon 5/15 by DrDBrodie showed divertics in sigmoid, melanosis, no recurrent polyps (she rec f/u 10 yrs).    Osteopenia> on Calcium, MVI, VitD2000; she stopped prev Fosamax Rx yrs ago- last BMD 2009 w/ Tscore -1.5 in [facility]...    Anxiety> husb [name] passed away 03-09-2014 after a long illness, she is doing satis & declines anxiolytic meds...    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 We reviewed prob list, meds, xrays and labs> see below for updates >>   CXR 05/2014 showed norm heart size, clear lungs, NAD...   LABS 4/16:  FLP- at goals on diet;  TSH=.89;  Prev Chems/ CBC 01/2014 were wnl...   ~  June 12, 2015:  76yr ROV & Ariel Holmes has had some interval GYN problems w/ prolapsed uterus "my insides fell out" & currently managed w/ a pessary- changed every 76mo & she is holding off on surg consideration (she did see Urology for PT, exercises);  She quit smoking 5 yrs ago & I note that she is still using nicorette  gum daily!  She notes that her breathing is OK, no cough, sputum, hemoptysis, and denies SOB, states she's walking 3-4 mi per day w/ friends & denies problems;  She had some neck/ shoulder pain & managed this w/ massage therapy & accupuncture/ "cupping" which really helped she says...    AR, AB, ex-smoker> on OTC Antihist, Flonase, & Nicorette; she quit smoking 6/12 but still using Nicotine replacement & we discussed weaning this "medication"; breathing is good, exercising daily.    MVP> on ASA81; she denies CP, palpit, SOB, edema, etc; she is active & doing satis...    Ven Insuffic> she knows to avoid sodium, elev legs, wear support hose...    Chol> on diet alone esp w/  her good HDL; Labs 4/17 showed TChol 200, TG 74, HDL 87, LDL 98    Hypothy> on Synthroid100; Labs 4/17 showed TSH= 0.28; she remains clinically stable but wt is down to 116# so we decided to decr the Synthroid to 64mcg/d.    GI- GERD, Polyps, Hems> stable w/o abd pain, dysphagia, n/v, c/d, blood seen; last colon 5/15 by DrDBrodie showed divertics in sigmoid, melanosis, no recurrent polyps (she rec f/u 10 yrs).    GYN> followed by DrSilva w/ uterine proalpse, she has a pessary (cleaned & inspected Q76mo) and she as seen Urology for PT & kaegel exercises...    Osteopenia> on Calcium, MVI, VitD2000; she stopped prev Fosamax Rx yrs ago- last BMD 2009 w/ Tscore -1.5 in [facility]...    Anxiety> husb [name] passed away 03-29-14 after a long illness, she is doing satis & declines anxiolytic meds...    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 EXAM shows Afeb, VSS, O2sat=97% on RA;  HEENT- neg, mallampati1;  Chest- clear w/o w/r/r;  Heart- RR w/o m/r/g;  Abd- soft, nontender, neg;  Ext- w/o c/c/e;  Neuro- intact...  CXR 06/12/15>  Norm heart size,clear lungs w/ sl incr markings, NAD, DDD in Tspine...  LABS 01/2015 by DrEnnever>  Chems- wnl;  CBC- ok w/ Hg=12.1;  Vit D=61...  LABS 06/12/15>  FLP- all parameters at goals on diet alone;  TSH= 0.28 on Synthroid100 IMP/PLAN>>  Ariel Holmes is stable overall, has quit smoking 5 yrs ago but still using nicorette daily- again asked to wean off;  Her TSH is sl oversuppressed on Synthroid100 & wt is down to 116# so we will decr dose to 55mcg/d & plan recheck TFTs;  She was given PREVNAR-13 today;  We plan ROV in 76yr, sooner if needed prn...  ~  August 01, 2015:  6wk ROV & Ariel Holmes tells me that she went to [place]/ [facility] in May & returned 5/23 c/o "feeling yukey", low grade temp 99+, cough/ sinus drainage/ blowing yellow blood-streaked sput/ sore throat;  ZPak called in for pt & she  improved some but "this sounds crazy" but every afternoon the symptoms felt like they were coming back; we recommended a Medrol dosepak but pt refused this med- took OTC Mucinex, Flonase, Tylenol and called several times just still feeling bad, no energy, etc=> we brought her in for recheck...  We reviewed the problem list above.    Recall that last OV her TSH=0.28 on Synthroid100 & we cut her back to 6mcg/d but she has felt worse on this dose=> we will recheck labs today... EXAM shows Afeb, VSS, O2sat=100% on RA;  HEENT- neg, mallampati1;  Neck- soft right carotid bruit;  Chest-  clear w/o w/r/r;  Heart- RR w/o m/r/g;  Abd- soft, nontender, neg;  Ext- w/o c/c/e;  Neuro- intact...  Labs 08/01/15>  Chems- wnl;  CBC- essent wnl;  TSH=2.81 on the Synthroid75- continue same;  Sed=8 therefore no signif inflamm...  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 IMP/PLAN>>  Jan appears to have a post-viral syndrome & in the last few days she started feeling sl better, we discussed Depo80/ rest/ fluids/ continued OTC rx w/ Mucinex, Flonase, Tylenol, etc;  Her exam is neg x for a soft right CBruit heard today=> check CDoppler;  She had ?prob (feeling worse) within a few days of decreasing her Synthroid from 152mcg to 20mcg in April & we rechecked labs today...  ~  December 01, 2015:  19mo ROV & add-on appt requested for URI>  Makayle tells me she's been working the [employer] exposed to folks from all over; notes 3d hx URI w/ cough, min clear sput, no discoloration or blood, feeling tired, "yucky", aching/ sore, ?swollen glands, wants to sleep; denies CP, SOB, wheezing, etc; notes temp 98.8 today & says "thats a fever for me"... We reviewed her problem list & meds- as above, stable, except she's seen her GYN- DrSilva x6 since Jun2017 w/ UTI x2, uterine prolapse w/ pessary as she declines surg options...      EXAM shows Afeb, VSS, O2sat=99% on RA;  HEENT- neg x sl red, mallampati1;  Chest- clear w/o w/r/r;  Heart- RR w/o m/r/g;  Abd- soft, nontender, neg;  Ext- w/o c/c/e;  Neuro- intact... IMP/PLAN>>  Charlisa appears to have an upper resp infection, suspect viral but she'd apprec Rx w/ Depo & ZPak- done per request 7 rec to rest w/ fluids, Tylenol, etc...  ~  June 14, 2016:  58mo ROV & Ariel Holmes is c/o some muscle spasm in her back & neck- takes aleve prn & gets accupuncturew/ cupping at [facility]" here in [place] "It sure helps" she says;  Asking about the new shingles vaccine & we discussed the rec for 2 doses of SHINGRIX, even for those who had the Zostavax in the past, Rx written, asked to check w/ her insurance first, 2nd dose is ~44mo from the 1st...  She shared that her 69 y/o son in [place] was Dx w/ Parkinson's dis & doing boxing therapy... We reviewed the following medical problems during today's office visit >>     AR, AB, ex-smoker> on OTC Antihist, Flonase, & Nicorette; she quit smoking 6/12 but still using Nicotine daily & we discussed weaning this "medication"; breathing is good- no cough/ sputum/ SOB, exercising daily.    MVP> on ASA81; she denies CP, palpit, SOB, edema, etc; she is active & doing satis...    Ven Insuffic> she knows to avoid sodium, elev legs, wear support hose...    Chol> on diet alone esp w/ her good HDL; Labs 4/18 showed TChol 201, TG 66, HDL 91, LDL 98    Hypothy> on Synthroid88; Labs 4/18 showed TSH= 1.63; she remains clinically stable but wt is stable at 116# & rec to continue same med...    GI- GERD, Polyps, Hems> on Tagamet400 prn; stable w/o abd pain, dysphagia, n/v, c/d, blood seen; last colon 5/15 by DrDBrodie showed divertics in sigmoid, melanosis, no recurrent polyps (she rec f/u 10 yrs).    GYN> followed by DrSilva w/ uterine proalpse, she has a pessary (  cleaned & inspected Q1-24mo) and she as seen Urology for PT & kaegel exercises...    Osteopenia> on Calcium, MVI,  VitD2000; she stopped prev Fosamax Rx yrs ago- BMD f/u 05/2016 showed Tscores -1.0 in Lspine & -1.6 in left Femur; Rec to take Calcium, womensMVI, VitD~2000u daily & wt bearing exercise.               Anxiety> husb [name] passed away 2014-03-07 after a long illness, she is doing satis & declines anxiolytic meds...    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 EXAM shows Afeb, VSS, O2sat=99% on RA;  HEENT- neg x sl red, mallampati1;  Chest- clear w/o w/r/r;  Heart- RR w/o m/r/g;  Abd- soft, nontender, neg;  Ext- w/o c/c/e;  Neuro- intact...  CXR 06/14/16 (independently reviewed by me in the PACS system) showed norm heart size, mild atherosclerosis of Ao, sl incr markings, otherw clear- NAD...   LABS 06/14/16>  FLP- at goals on diet alone;  Chems- wnl;  CBC- wnl x wbc=12.9;  TSH=1.63 on 64mcg/d...  Bone Density Test  4.30/18> showed Tscores -1.0 in Lspine & -1.6 in left Femur... Rec to take Calcium, womensMVI, VitD~2000u daily & wt bearing exercise. IMP/PLAN>  Ariel Holmes is stable w/ mult medcal issues as above-  rec to continue same meds, exercise, wean the nicorette, & call for any problems...    NOTE:  >50% of this 30 min ROV was spent in counseling 7 coordination of care...   ~  December 14, 2016:  21mo ROV & Ariel Holmes describes a recent tri[p to [place]- she did well medically & had a blast... She tripped at home 7/1, hit right side of head w/ black eye, went to ER & they used superglue;  She reports feeling OK- notes some right shoulder pain & sh'e asking about PT but I mentuioned Ortho eval 1st...  We reviewed the following medical problems during today's office visit >>     AR, AB, ex-smoker> on OTC Antihist, Flonase, & Nicorette; she quit smoking 6/12 but still using Nicotine daily! & we discussed weaning this "medication"; breathing is good- no cough/ sputum/ SOB, exercising daily.    MVP> on ASA81; she denies  CP, palpit, SOB, edema, etc; she is active & doing satis; as noted she still chews Nicorette gum daily, and she drinks 4+ caffeine beverages daily as well- advised to wean off both!    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    VV & Ven Insuffic> she knows to avoid sodium, elev legs, wear support hose; she is an avid walker...    Chol> on diet alone esp w/ her good HDL; Labs 4/18 showed TChol 201, TG 66, HDL 91, LDL 98    Hypothy> on Synthroid88; Labs 4/18 showed TSH= 1.63; she remains clinically stable but wt is stable at 115# & rec to continue same med...    GI- GERD, Polyps, Hems> on Tagamet400 prn; stable w/o abd pain, dysphagia, n/v, c/d, blood seen; last colon 5/15 by DrDBrodie showed divertics in sigmoid, melanosis, no recurrent polyps (she rec f/u 10 yrs).    GYN> followed by DrSilva w/ uterine proalpse, she has a pessary (cleaned & inspected Q1-63mo) and she as seen Urology for PT & kaegel exercises...    DJD> c/o right  shoulder pain & she uses OTC aleve which helps, offered Ortho referral when she is ready...    Osteopenia> on Calcium, MVI, VitD2000; she stopped prev Fosamax Rx yrs ago- BMD f/u 05/2016 showed Tscores -1.0 in Lspine & -1.4 in left Femur; Rec to take Calcium, womensMVI, VitD~2000u daily & wt bearing exercise.              Anxiety> husb [name] passed away 22-Mar-2014 after a long illness, she is doing satis & declines anxiolytic meds...    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 EXAM shows Afeb, VSS, O2sat=98% on RA;  HEENT- neg x sl red, mallampati1;  Chest- clear w/o w/r/r;  Heart- RR w/o m/r/g;  Abd- soft, nontender, neg;  Ext- w/o c/c/e;  Neuro- intact... IMP/PLAN>>  Ariel Holmes is stable overall w/ mult medical issues above; she is asked to  decr Nicorette & caffeine, stay as active as poss, consider Ortho eval of right shoulder pain;  She's had the 2018 FLU vaccine;  We plan ROV recheck in 26m, sooner if needed prn...          PROBLEM LIST:    ALLERGIC RHINITIS - uses FLONASE Prn...  ASTHMATIC BRONCHITIS, ACUTE (ICD-466.0) - no recent bronchitic exac... not on regular meds. ~  CXR 12/09 showed hyperinflation & incr markings, spondylosis in TSpine... 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 ~  CXR 3/13 showed heart size is wnl, lungs clear x sl incr markings, NAD. ~  CXR 3/14 showed normal heart size, calcif & ectasia of Ao, clear lungs, DJD/ sl scoliosis of spine/ osteopenia. ~  CXR 3/15 showed norm heart size, clear lungs, mild DJD spine, no lesions/ NAD. ~  PTF 3/15 showed FVC=2.00 (76%), FEV1=1.56 (77%), %1sec=78%, mid-flows=75% predicted... Interpret: No signif obstruction, can't r/o mild restriction w/o lung vol measurement ~  CXR 4/16 showed norm heart size, clear lungs, NAD. 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 ~  Still using nicorette gum daily 7 asked to wean down...  MITRAL VALVE PROLAPSE, HX OF (ICD-V12.50) - on ASA 81mg /d... hx mild MVP on 2DEcho in 1995 (no signif MR)... asymptomatic without CP or palpit... 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 she knows to avoid sodium, elevate legs, wear support hose, etc...  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 Keep up the good work. ~  [place] 3/14 on diet alone showed TChol 228, TG 92, HDL 88, LDL 109 ~  FLP 3/15 on diet alone showed TChol 209, TG 78, HDL 83, LDL 105  ~  FLP 4/16 on diet alone showed TChol 196, TG 93, HDL 76, LDL 101  ~  FLP 4/17 on diet alone showed TChol 200, TG 74, HDL 87, LDL 98  HYPOTHYROIDISM (ICD-244.9) - on LEVOTHYROID 123mcg/d... ~  labs 12/09 showed TSH= 0.44 ~  labs 1/11 showed TSH= 0.66 ~  labs 1/12 showed TSH= 0.85 ~  Labs 3/13 on Levo100 showed TSH= 0.71 ~  Labs 3/14 on Levo100 showed TSH=0.42 ~  Labs 3/15 on Levo100 showed TSH= 0.43 ~  Labs 4/16 on Levo100 showing TSH= 0.89 ~  Labs 4/17 on Levo100 showed TSH=0.28, her wt is down to 116# & we decided to decr the Synthroid dose to 40mcg/d...  ESOPHAGEAL REFLUX (ICD-530.81) - she uses Cimetadine 300mg  Prn for "Stomach" & requests refill from [place]...  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 ~  colonoscopy 4/00 by DrDBrodie showed several 6-45mm polyps= adenomatous... ~  colonoscopy 4/05 by DrDBrodie showed only melanosis and hems... ~  colonoscopy 5/10 DrDBrodie showed mild divertics, 2 cecal polyps- tubular adenoma, f/u 64yrs. ~  colonoscopy 5/15 DrDBrodie showed divertics in sigmoid, melanosis, no recurrent polyps (she rec f/u 10 yrs).  OSTEOPENIA (ICD-733.90) - on calcium, MVI, Vit D 2000 u daily... she stopped her prev Fosamax rx on her own in 2011. ~  BMD here 8/07 showed TScores -1.2 in Spine, and -1.8 in left FemNeck. ~  labs 8/08 showed Vit D level= 38 ~  BMD here 9/09 showed TScores -0.8 in Spine, and -1.5 in left FemNeck. ~  Labs 1/11 showed Vit D level = 51 ~  Labs 3/14 showed Vit D level = 60 ~  Labs 3/15 showed Vit D level = 79 ~  BMD 3/15 showed Tscore -1.4 & rec to continue MVI, VitD, wt bearing exercises.  ANXIETY (ICD-300.00) - not currently on meds.  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 ~  She reports on-going follow up from [place] ?every 3-55mo? ~  11/13: she saw DrEnnever for follow up> on observation, doing well w/o recurrence...  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 ~  She continues to f/u w/ DrEnnever yearly...  Health Maintenance: ~  neg [facility] at [facility]- followed yearly... due for GYN check by [facility] & she will call. ~  Immunizations:  she gets the yearly seasonal Flu vaccines... she received Shingles vaccine 2/09 at [facility]... had PNEUMOVAX in 2002 & 1/12 (age 76)... given TDAP 1/12.   Past Surgical History:  Procedure Laterality Date  . 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 Patient does not remember exact date of surgery.  . [place]  . COLONOSCOPY    . MELANOMA EXCISION  12/31/2010   Procedure: MELANOMA EXCISION;  Surgeon: Zenovia Jarred, MD;  Location: [facility];  Service: General;  Laterality: Left;  wide excision melanoma left thigh, excision lesion left thigh  . OTHER SURGICAL HISTORY Right    Surgical repair of architectual distortion Right Breast- Hyperplasia  . TONSILLECTOMY      Outpatient Encounter Prescriptions as of 12/14/2016  Medication Sig  . acyclovir ointment (ZOVIRAX) 5 % See admin instructions. Prn cold sores  . aspirin 81 MG tablet Take 81 mg by mouth daily.    . AZO-CRANBERRY PO Take by mouth daily.  . Calcium  Carbonate-Vitamin D (CALCIUM + D PO) Take by mouth daily.    . Cholecalciferol (VITAMIN D) 2000 UNITS CAPS Take 1 capsule by mouth daily.  . cimetidine (TAGAMET) 400 MG tablet TAKE 1 TABLET BY MOUTH TWICE A DAY  . conjugated estrogens (PREMARIN) vaginal cream Use 1/2 g vaginally  with each pessary cleaning.  . fluticasone (FLONASE) 50 MCG/ACT nasal spray PLACE 2 SPRAYS INTO BOTH NOSTRILS AS NEEDED.  [name] levothyroxine (SYNTHROID, LEVOTHROID) 88 MCG tablet Take 1 tablet (88 mcg total) by mouth daily before breakfast.  . LUTEIN PO Take 1 tablet by mouth daily.  . Multiple Vitamin (MULTIVITAMIN) tablet Take 1 tablet by mouth daily.    . nicotine polacrilex (NICORETTE) 2 MG gum Take 2 mg by mouth as needed. Not smoking  . NONFORMULARY OR COMPOUNDED ITEM Vaginal estradiol cream 0.02%.  Place 1/2 gram per vagina at bedtime every other night for 10 days, then place 1/2 gram per vaginal at bedtime twice weekly.   No facility-administered encounter medications on file as of 12/14/2016.     Allergies  Allergen Reactions  . Codeine Nausea And Vomiting  . Ciprofloxacin     Musculskeletal pain.  . Sulfur Nausea Only    Immunization History  Administered Date(s) Administered  . H1N1 01/18/2008  . Influenza Split 11/16/2010, 11/29/2011  . Influenza Whole 10/28/2009  . Influenza, High Dose Seasonal PF 12/16/2015, 12/03/2016  . Influenza,inj,Quad PF,6+ Mos 11/14/2012, 11/28/2013, 11/29/2014  . Influenza-Unspecified 12/04/2014  . Pneumococcal Conjugate-13 06/12/2015  . Pneumococcal Polysaccharide-23 03/11/2010  . Td 03/11/2010  . Tdap 08/14/2016  . Zoster 03/01/2007, 09/11/2016  . Zoster Recombinat (Shingrix) 06/14/2016    Current Medications, Allergies, Past Medical History, Past Surgical History, Family History, and Social History were reviewed in [employer] record.   Review of Systems         The patient prev complained of fatigue, dyspnea on exertion, cough, gas/bloating, incontinence, joint pain, arthritis, and anxiety.  The patient denies fever, chills, sweats, anorexia, weakness, malaise, weight loss, sleep disorder, blurring, diplopia, eye irritation, eye discharge, vision loss, eye pain, photophobia, earache, ear discharge, tinnitus,  decreased hearing, nasal congestion, nosebleeds, sore throat, hoarseness, chest pain, palpitations, syncope, orthopnea, PND, peripheral edema, dyspnea at rest, excessive sputum, hemoptysis, wheezing, pleurisy, nausea, vomiting, diarrhea, constipation, change in bowel habits, abdominal pain, melena, hematochezia, jaundice, indigestion/heartburn, dysphagia, odynophagia, dysuria, hematuria, urinary frequency, urinary hesitancy, nocturia, back pain, joint swelling, muscle cramps, muscle weakness, stiffness, sciatica, restless legs, leg pain at night, leg pain with exertion, rash, itching, dryness, suspicious lesions, paralysis, paresthesias, seizures, tremors, vertigo, transient blindness, frequent falls, frequent headaches, difficulty walking, depression, memory loss, confusion, cold intolerance, heat intolerance, polydipsia, polyphagia, polyuria, unusual weight change, abnormal bruising, bleeding, enlarged lymph nodes, urticaria, allergic rash, hay fever, and recurrent infections.     Objective:   Physical Exam     WD, WN, 76 y/o WF in NAD...  GENERAL:  Alert & oriented; pleasant & cooperative... HEENT:  [place]/AT, EOM-wnl, PERRLA, EACs-clear, TMs-wnl, NOSE-clear, THROAT-clear & wnl. NECK:  Supple w/ fairROM; no JVD; normal carotid impulses w/ faint right bruit; no thyromegaly or nodules palpated; no lymphadenopathy. CHEST:  Clear to P & A; without wheezes/ rales/ or rhonchi. HEART:  Regular Rhythm; without murmurs/ rubs/ or gallops. ABDOMEN:  Soft & nontender; normal bowel sounds; no organomegaly or masses detected. EXT: without deformities or arthritic changes; mild varicose veins/ venous insuffic/ tr edema. Scar on inner left thigh from melanoma surg, no palp adenopathy... NEURO:  CN's  intact; motor testing normal; sensory testing normal; gait normal & balance OK. DERM:  No lesions noted; no rash etc...  RADIOLOGY DATA:  Reviewed in the EPIC EMR & discussed w/ the patient...   LABORATORY DATA:   Reviewed in the EPIC EMR & discussed w/ the patient...    Assessment:      12/14/16>   Anushri is stable overall w/ mult medical issues abouve; she is asked to decr Nicoreete & caffeine, stasy as active as poss, consider Ortho eval of right shoulder pain;  She's had the 2018 FLU vaccine;  We plan ROV recheck in 86m, sooner if needed prn   Viral URI>  Similar to the below viral syndrome from [id number]- we will Rx w/ Depo80, ZPak, she declines dosepak ("I don't do well w/ pred")... Post-viral syndrome>  Returned from trip to [facility] 07/08/15 w/ URI, viral syndrome & protracted symptoms- given ZPak, then Depo80 + OTC meds and slowly improving. AB, ex-smoker>  She quit smoking 6/12 on a bet w/ her uncle; & she has remain quit!  No resp exac & stable, no meds needed... ~  Asked to wean off the nicorette gum!  Hx MVP>  On ASA, she is asymptomatic w/o CP, palpit, etc... ~  CDoppler 07/2015 w/ mild plaque, no signif ICA stenoses...  CHOL>  FLP is much improved on diet & she will not need meds at this time...  Hypothyroid>  On Levothy88 w/ TSH= 1.63 in April2018, continue same...  GERD>  She uses OTC Tagamet as needed (her preference)...  Divertics, Colon Polyps>  She is up to date on colon screening w/ no polyps seen 5/15...  Osteopenia>  On Calcium, MVI, Vit D; she stopped her Fosamax on her own in 2011 & f/u BMD 05/2016 showed lowest Tscore -1.6 in left Femur- on Ca, MVI, VitD, wt bearing exercise.  Anxiety>  She does not want anxiolytic therapy...  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     Plan:     Patient's Medications  New Prescriptions   No medications on file  Previous Medications   ACYCLOVIR OINTMENT (ZOVIRAX) 5 %    See admin instructions. Prn cold sores   ASPIRIN 81 MG TABLET    Take 81 mg by mouth daily.     AZO-CRANBERRY PO     Take by mouth daily.   CALCIUM CARBONATE-VITAMIN D (CALCIUM + D PO)    Take by mouth daily.     CHOLECALCIFEROL (VITAMIN D) 2000 UNITS CAPS    Take 1 capsule by mouth daily.   CIMETIDINE (TAGAMET) 400 MG TABLET    TAKE 1 TABLET BY MOUTH TWICE A DAY   CONJUGATED ESTROGENS (PREMARIN) VAGINAL CREAM    Use 1/2 g vaginally with each pessary cleaning.   FLUTICASONE (FLONASE) 50 MCG/ACT NASAL SPRAY    PLACE 2 SPRAYS INTO BOTH NOSTRILS AS NEEDED.   LEVOTHYROXINE (SYNTHROID, LEVOTHROID) 88 MCG TABLET    Take 1 tablet (88 mcg total) by mouth daily before breakfast.   LUTEIN PO    Take 1 tablet by mouth daily.   MULTIPLE VITAMIN (MULTIVITAMIN) TABLET    Take 1 tablet by mouth daily.     NICOTINE POLACRILEX (NICORETTE) 2 MG GUM    Take 2 mg by mouth as needed. Not smoking   NONFORMULARY OR COMPOUNDED ITEM    Vaginal estradiol cream 0.02%.  Place  1/2 gram per vagina at bedtime every other night for 10 days, then place 1/2 gram per vaginal at bedtime twice weekly.  Modified Medications   No medications on file  Discontinued Medications   No medications on file

## 2016-12-14 NOTE — Patient Instructions (Signed)
Today we updated your med list in our EPIC system...    Continue your current medications the same...  OK to continue the Aleve and the Salonpas patches for your right shoulder...    We will arrange for an Orthopedic consult...  Stay as active as poss...  Call for any questions...  Let's plan a follow up visit in 71mo, sooner if needed for problems.Marland KitchenMarland Kitchen

## 2016-12-31 ENCOUNTER — Encounter: Payer: Self-pay | Admitting: Obstetrics and Gynecology

## 2016-12-31 ENCOUNTER — Telehealth: Payer: Self-pay | Admitting: Pulmonary Disease

## 2016-12-31 ENCOUNTER — Telehealth: Payer: Self-pay | Admitting: *Deleted

## 2016-12-31 ENCOUNTER — Ambulatory Visit: Payer: Medicare Other | Admitting: Obstetrics and Gynecology

## 2016-12-31 VITALS — BP 138/70 | HR 76 | Ht 62.0 in | Wt 116.8 lb

## 2016-12-31 DIAGNOSIS — N812 Incomplete uterovaginal prolapse: Secondary | ICD-10-CM | POA: Diagnosis not present

## 2016-12-31 DIAGNOSIS — Z8679 Personal history of other diseases of the circulatory system: Secondary | ICD-10-CM

## 2016-12-31 NOTE — Progress Notes (Signed)
Call placed to PCP -Dr. Lenna Gilford at [phone], spoke with Caryl Pina. Advised patient present, evaluated by Dr. Quincy Simmonds today, irregular heartbeat noted on exam. Request recommendations for f/u regarding irregular heartbeat from Dr. Lenna Gilford. Patient consumes 4 caffeine drinks daily and has received steroid injection this week. Was advised by Caryl Pina will send message to nurse for review and return all. This RN request if OV recommended, call patient directly to schedule at [phone]. Please return call to [facility] for update. Reviewed with patient, agreeable to plan.   See telephone encounter dated 12/31/16.

## 2016-12-31 NOTE — Telephone Encounter (Signed)
Call placed to PCP -Dr. Lenna Gilford at [phone], spoke with Caryl Pina. Advised patient present, evaluated by Dr. Quincy Simmonds today, irregular heartbeat noted on exam. Request recommendations for f/u regarding irregular heartbeat from Dr. Lenna Gilford. Patient consumes 4 caffeine drinks daily and has received steroid injection this week. Was advised by Caryl Pina will send message to nurse for review and return all. This RN request if OV recommended, call patient directly to schedule at [phone]. Please return call to [facility] for update. Reviewed with patient, agreeable to plan.   See telephone encounter dated 12/31/16.

## 2016-12-31 NOTE — Telephone Encounter (Signed)
Spoke to [place] with [employer]. Sharee Pimple states that pt seen Dr. Sunday Spillers today and irregular heartbeat were noted upon exam. Sharee Pimple states pt consumes 4 caffeine drinks daily and has received steroid injection this week.   SN please advise if pt can be worked in. Thanks.

## 2016-12-31 NOTE — Progress Notes (Signed)
GYNECOLOGY  VISIT   HPI: 76 y.o.   Widowed  Caucasian  female   G1P1001 with Patient's last menstrual period was 02/16/1988 (approximate).   here for 4 week pessary check.    No problems with pessary.   Feels jittery today.  Drinks 4 caffeine beverages per day.  GYNECOLOGIC HISTORY: Patient's last menstrual period was 02/16/1988 (approximate). Contraception:  Postmenopausal Menopausal hormone therapy: Premarin cream Last mammogram:  01/01/16 BIRADS 2 negative/density B:[facility] Last pap smear: 2012 Neg        OB History    Gravida Para Term Preterm AB Living   1 1 1     1    SAB TAB Ectopic Multiple Live Births                     Patient Active Problem List   Diagnosis Date Noted  . Right shoulder pain 12/14/2016  . Vertigo 04/02/2016  . Acute upper respiratory infection 12/01/2015  . Right carotid bruit 08/01/2015  . Uterine prolapse 06/13/2013  . Cystocele 06/13/2013  . Ex-cigarette smoker 05/04/2013  . Asthmatic bronchitis 05/04/2013  . Melanoma of thigh ([place]) 02/15/2011  . ESOPHAGEAL REFLUX 03/24/2010  . 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 HYPERCHOLESTEROLEMIA, BORDERLINE, WITH HIGH HDL 01/18/2008  . Disorder of bone and cartilage 01/18/2008  . COLONIC POLYPS 01/17/2008  . Hypothyroidism 01/17/2008  . Anxiety state 01/17/2008  . History of cardiovascular disorder 01/17/2008    Past Medical History:  Diagnosis Date  . COPD (chronic obstructive pulmonary disease) ([place])   . Hx of colonic polyps   . Hypercholesterolemia   . Hypothyroidism   . Melanoma ([place])    lt upper thigh  . Melanoma of thigh ([place]) 02/15/2011  . Melanoma of thigh ([place]) 2014   Left   . MVP (mitral valve prolapse)   . Osteopenia   . Prolapsed uterus   . Varicose vein     Past Surgical History:  Procedure Laterality Date  . 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 Patient does not remember exact date of surgery.  . [place]  . COLONOSCOPY    . MELANOMA  EXCISION Left 12/31/2010   Performed by Zenovia Jarred, MD at [facility]  . OTHER SURGICAL HISTORY Right    Surgical repair of architectual distortion Right Breast- Hyperplasia  . TONSILLECTOMY      Current Outpatient Medications  Medication Sig Dispense Refill  . acyclovir ointment (ZOVIRAX) 5 % See admin instructions. Prn cold sores  0  . aspirin 81 MG tablet Take 81 mg by mouth daily.      . AZO-CRANBERRY PO Take by mouth daily.    . Calcium Carbonate-Vitamin D (CALCIUM + D PO) Take by mouth daily.      . Cholecalciferol (VITAMIN D) 2000 UNITS CAPS Take 1 capsule by mouth daily.    . cimetidine (TAGAMET) 400 MG tablet TAKE 1 TABLET BY MOUTH TWICE A DAY (Patient taking differently: prn) 60 tablet 0  . conjugated estrogens (PREMARIN) vaginal cream Use 1/2 g vaginally with each pessary cleaning. 30 g 1  . fluticasone (FLONASE) 50 MCG/ACT nasal spray PLACE 2 SPRAYS INTO BOTH NOSTRILS AS NEEDED. 16 g 5  . levothyroxine (SYNTHROID, LEVOTHROID) 88 MCG tablet Take 1 tablet (88 mcg total) by mouth daily before breakfast. 30 tablet 11  . LUTEIN PO Take 1 tablet by mouth daily.    . Multiple Vitamin (MULTIVITAMIN) tablet Take 1 tablet by  mouth daily.      . nicotine polacrilex (NICORETTE) 2 MG gum Take 2 mg by mouth as needed. Not smoking    . NONFORMULARY OR COMPOUNDED ITEM Vaginal estradiol cream 0.02%.  Place 1/2 gram per vagina at bedtime every other night for 10 days, then place 1/2 gram per vaginal at bedtime twice weekly. 8 each 10   No current facility-administered medications for this visit.      ALLERGIES: Codeine; Ciprofloxacin; and Sulfur  Family History  Problem Relation Age of Onset  . Hypertension Mother   . Congestive Heart Failure Mother   . Thyroid disease Mother   . Stroke Father   . Heart attack Father 88  . Diabetes Paternal Grandmother   . Diabetes Paternal Grandfather   . Hypertension Maternal Grandfather   . CVA Maternal Grandfather   .  Esophageal cancer Neg Hx   . Rectal cancer Neg Hx   . Stomach cancer Neg Hx   . Colon cancer Neg Hx     Social History   Socioeconomic History  . Marital status: Widowed    Spouse name: fred Heidelberger  . Number of children: Not on file  . Years of education: Not on file  . Highest education level: Not on file  Social Needs  . Financial resource strain: Not on file  . Food insecurity - worry: Not on file  . Food insecurity - inability: Not on file  . Transportation needs - medical: Not on file  . Transportation needs - non-medical: Not on file  Occupational History  . Not on file  Tobacco Use  . Smoking status: Former Smoker    Packs/day: 1.00    Years: 50.00    Pack years: 50.00    Types: Cigarettes    Start date: 03/08/1960    Last attempt to quit: 07/23/2010    Years since quitting: 6.4  . Smokeless tobacco: Never Used  . Tobacco comment: quit smoking 3 years ago  Substance and Sexual Activity  . Alcohol use: No    Alcohol/week: 0.0 oz  . Drug use: No  . Sexual activity: No    Partners: Male    Birth control/protection: Post-menopausal  Other Topics Concern  . Not on file  Social History Narrative  . Not on file    ROS:  Pertinent items are noted in HPI.  PHYSICAL EXAMINATION:    BP 138/70 (BP Location: Right Arm, Patient Position: Sitting, Cuff Size: Normal)   Pulse 76   Ht 5\' 2"  (1.575 m)   Wt 116 lb 12.8 oz (53 kg)   LMP 02/16/1988 (Approximate)   BMI 21.36 kg/m     General appearance: alert, cooperative and appears stated age Heart:  S1 S2 RRR.  Occasional S3? Pelvic: External genitalia:  no lesions              Urethra:  normal appearing urethra with no masses, tenderness or lesions              Bartholins and Skenes: normal                 Vagina: normal appearing vagina with normal color and discharge, no lesions.  Mild erythema of left vaginal apex about 1.5 cm.               Cervix: no lesions                Bimanual Exam:  Uterus:  normal size,  contour, position, consistency, mobility,  non-tender              Adnexa: no mass, fullness, tenderness           Pessary removed, cleansed, and replaced with Premarin cream 1 gram.   Chaperone was present for exam.  ASSESSMENT  Incomplete uterovaginal prolapse.  Doing well with pessary.  Cardiac arrhythmia.  PLAN  We discussed levels of uterovaginal prolapse today and well as life history of prolapse.  I advised against extreme lifting in order to reduce progression of her prolapse.  (She does not do this.) Continue use of pessary.  Follow up in 4 weeks for pessary check. We will call Dr. Daneil Dolin office to receive recommendations for the patient's care.  I advised her to reduce her caffeine use.  An After Visit Summary was printed and given to the patient.  _15_____ minutes face to face time of which over 50% was spent in counseling.

## 2016-12-31 NOTE — Telephone Encounter (Signed)
Spoke with Margie from Dr. Jeannine Kitten office. Lesleigh Noe will call patient directly to schedule OV for further evaluation of irregular heartbeat.   Routing to provider for final review.  Will close encounter.

## 2017-01-03 NOTE — Telephone Encounter (Signed)
Called and spoke with pt and she is aware of appt with SN on 12-3 with ekg and I have placed the order to set up her echo prior to her appt.  Nothing further is needed.

## 2017-01-04 ENCOUNTER — Other Ambulatory Visit: Payer: Self-pay

## 2017-01-04 ENCOUNTER — Ambulatory Visit (HOSPITAL_COMMUNITY): Payer: Medicare Other | Attending: Cardiology

## 2017-01-04 DIAGNOSIS — I420 Dilated cardiomyopathy: Secondary | ICD-10-CM | POA: Diagnosis not present

## 2017-01-04 DIAGNOSIS — I081 Rheumatic disorders of both mitral and tricuspid valves: Secondary | ICD-10-CM | POA: Diagnosis not present

## 2017-01-04 DIAGNOSIS — Z8679 Personal history of other diseases of the circulatory system: Secondary | ICD-10-CM

## 2017-01-04 DIAGNOSIS — I5030 Unspecified diastolic (congestive) heart failure: Secondary | ICD-10-CM | POA: Insufficient documentation

## 2017-01-17 ENCOUNTER — Ambulatory Visit: Payer: Medicare Other | Admitting: Pulmonary Disease

## 2017-01-17 ENCOUNTER — Encounter: Payer: Self-pay | Admitting: Pulmonary Disease

## 2017-01-17 ENCOUNTER — Telehealth: Payer: Self-pay | Admitting: Pulmonary Disease

## 2017-01-17 VITALS — BP 132/70 | HR 64 | Temp 98.4°F | Ht 62.0 in | Wt 119.0 lb

## 2017-01-17 DIAGNOSIS — Z0000 Encounter for general adult medical examination without abnormal findings: Secondary | ICD-10-CM | POA: Diagnosis not present

## 2017-01-17 DIAGNOSIS — R002 Palpitations: Secondary | ICD-10-CM | POA: Diagnosis not present

## 2017-01-17 DIAGNOSIS — Z87891 Personal history of nicotine dependence: Secondary | ICD-10-CM

## 2017-01-17 DIAGNOSIS — F411 Generalized anxiety disorder: Secondary | ICD-10-CM

## 2017-01-17 DIAGNOSIS — J4520 Mild intermittent asthma, uncomplicated: Secondary | ICD-10-CM

## 2017-01-17 NOTE — Telephone Encounter (Signed)
Patient left her AVS from today's visit.  This was mailed to her.

## 2017-01-17 NOTE — Progress Notes (Signed)
Subjective:     Patient ID: Ariel Holmes, female   DOB: [birth year], 76 y.o.   MRN: [id number]  HPI 76 y/o WF here for a follow up visit... she has multiple medical problems as noted below...   ~  SEE PREV EPIC NOTES FOR OLDER DATA >>    CXR 3/13 showed heart is wnl, lungs clear x sl incr markings, NAD.[name]  LABS 3/13:  FLP- Improved on diet;  Chems- wnl;  CBC- wnl;  TSH=0.71 on Levothy100/d...  LABS 11/13:  Chems- wnl;  CBC- wnl...  CXR 3/14 showed normal heart size, calcif & ectasia of Ao, clear lungs, DJD/ sl scoliosis of spine/ osteopenia...  LABS 3/14:  FLP- not at goals on diet alone but hi HDL;  TSH=0.42;  VitD=60   CXR 3/15 showed norm heart size, clear lungs, mild DJD spine, no lesions/ NAD...  PFT 3/15 showed FVC=2.00 (76%), FEV1=1.56 (77%), %1sec=78%, mid-flows=75% predicted... Interpret: No signif obstruction, can't r/o mild restriction w/o lung vol measurement- SMN  BMD 3/15 showed TScore -0.5 in Spine and -1.4 in [facility]; Rec to continue Calcium, MVI, VitD & exercise...  ~  June 11, 2014:  51mo ROV & Ariel Holmes has had a rough time since Ariel Holmes passed away Mar 04, 2014; she works Scientist, research (physical sciences) for [employer] & that has helped keeping her busy,,, she is c/o some nasal congestion, drainage, & blowing beige mucus; draining to her chest but denies hemooptysis, SOB, CP, f/c/s; she is fatigued and achey- using Flonase and Saline... She quit smoking 4 yrs ago & still chews nicorette gum- we discussed weaning this down already! We reviewed the following medical problems during today's office visit >>     AR, AB, ex-smoker> on OTC Antihist, Flonase, & Nicorette; she quit smoking 6/12 but still using Nicotine replacement & we discussed weaning this "medication"...    MVP> on ASA81; she denies CP, palpit, SOB, edema, etc; she is active & doing satis...    Ven Insuffic> she knows to avoid sodium, elev legs, wear support hose...    Chol> on diet alone esp w/ her good HDL; Labs 4/16  showed TChol 196, TG 93, HDL 76, LDL 101    Hypothy> on Synthroid100; Labs 4/16 showed TSH= 0.89; she remains clinically & biochem euthyroid...    GI- GERD, Polyps, Hems> stable w/o abd pain, dysphagia, n/v, c/d, blood seen; last colon 5/15 by Ariel Holmes showed divertics in sigmoid, melanosis, no recurrent polyps (she rec f/u 10 yrs).    Osteopenia> on Calcium, MVI, VitD2000; she stopped prev Fosamax Rx yrs ago- last BMD 2009 w/ Tscore -1.5 in [facility]...    Anxiety> husb Ariel Holmes passed away 2014/03/04 after a long illness, she is doing satis & declines anxiolytic meds...    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 We reviewed prob list, meds, xrays and labs> see below for updates >>   CXR 05/2014 showed norm heart size, clear lungs, NAD...   LABS 4/16:  FLP- at goals on diet;  TSH=.89;  Prev Chems/ CBC 01/2014 were wnl...   ~  June 12, 2015:  46yr ROV & Ariel Holmes has had some interval GYN problems w/ prolapsed uterus "my insides fell out" & currently managed w/ a pessary- changed every 73mo & she is holding off on surg consideration (she did see Urology for PT, exercises);  She quit smoking 5 yrs ago & I note that she is still using nicorette  gum daily!  She notes that her breathing is OK, no cough, sputum, hemoptysis, and denies SOB, states she's walking 3-4 mi per day w/ friends & denies problems;  She had some neck/ shoulder pain & managed this w/ massage therapy & accupuncture/ "cupping" which really helped she says...    AR, AB, ex-smoker> on OTC Antihist, Flonase, & Nicorette; she quit smoking 6/12 but still using Nicotine replacement & we discussed weaning this "medication"; breathing is good, exercising daily.    MVP> on ASA81; she denies CP, palpit, SOB, edema, etc; she is active & doing satis...    Ven Insuffic> she knows to avoid sodium, elev legs, wear support hose...    Chol> on diet alone esp w/  her good HDL; Labs 4/17 showed TChol 200, TG 74, HDL 87, LDL 98    Hypothy> on Synthroid100; Labs 4/17 showed TSH= 0.28; she remains clinically stable but wt is down to 116# so we decided to decr the Synthroid to 48mcg/d.    GI- GERD, Polyps, Hems> stable w/o abd pain, dysphagia, n/v, c/d, blood seen; last colon 5/15 by Ariel Holmes showed divertics in sigmoid, melanosis, no recurrent polyps (she rec f/u 10 yrs).    GYN> followed by Ariel Holmes w/ uterine proalpse, she has a pessary (cleaned & inspected Q71mo) and she as seen Urology for PT & kaegel exercises...    Osteopenia> on Calcium, MVI, VitD2000; she stopped prev Fosamax Rx yrs ago- last BMD 2009 w/ Tscore -1.5 in [facility]...    Anxiety> husb Ariel Holmes passed away 2014-03-21 after a long illness, she is doing satis & declines anxiolytic meds...    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 EXAM shows Afeb, VSS, O2sat=97% on RA;  HEENT- neg, mallampati1;  Chest- clear w/o w/r/r;  Heart- RR w/o m/r/g;  Abd- soft, nontender, neg;  Ext- w/o c/c/e;  Neuro- intact...  CXR 06/12/15>  Norm heart size,clear lungs w/ sl incr markings, NAD, DDD in Tspine...  LABS 01/2015 by Ariel Holmes>  Chems- wnl;  CBC- ok w/ Hg=12.1;  Vit D=61...  LABS 06/12/15>  FLP- all parameters at goals on diet alone;  TSH= 0.28 on Synthroid100 IMP/PLAN>>  [name] is stable overall, has quit smoking 5 yrs ago but still using nicorette daily- again asked to wean off;  Her TSH is sl oversuppressed on Synthroid100 & wt is down to 116# so we will decr dose to 4mcg/d & plan recheck TFTs;  She was given PREVNAR-13 today;  We plan ROV in 32yr, sooner if needed prn...  ~  August 01, 2015:  6wk ROV & Ariel Holmes tells me that she went to [facility]el [facility]/ [facility]el [facility] in [place] & returned 5/23 c/o "feeling yukey", low grade temp 99+, cough/ sinus drainage/ blowing yellow blood-streaked sput/ sore throat;  [facility] called in for pt & she  improved some but "this sounds crazy" but every afternoon the symptoms felt like they were coming back; we recommended a Medrol dosepak but pt refused this med- took OTC Mucinex, Flonase, Tylenol and called several times just still feeling bad, no energy, etc=> we brought her in for recheck...  We reviewed the problem list above.    Recall that last OV her TSH=0.28 on Synthroid100 & we cut her back to 36mcg/d but she has felt worse on this dose=> we will recheck labs today... EXAM shows Afeb, VSS, O2sat=100% on RA;  HEENT- neg, mallampati1;  Neck- soft right carotid bruit;  Chest-  clear w/o w/r/r;  Heart- RR w/o m/r/g;  Abd- soft, nontender, neg;  Ext- w/o c/c/e;  Neuro- intact...  Labs 08/01/15>  Chems- wnl;  CBC- essent wnl;  TSH=2.81 on the Synthroid75- continue same;  Sed=8 therefore no signif inflamm...  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 IMP/PLAN>>  Jan appears to have a post-viral syndrome & in the last few days she started feeling sl better, we discussed Depo80/ rest/ fluids/ continued OTC rx w/ Mucinex, Flonase, Tylenol, etc;  Her exam is neg x for a soft right CBruit heard today=> check CDoppler;  She had ?prob (feeling worse) within a few days of decreasing her Synthroid from 119mcg to 51mcg in April & we rechecked labs today...  ~  December 01, 2015:  71mo ROV & add-on appt requested for URI>  Ariel Holmes tells me she's been working the [employer] exposed to folks from all over; notes 3d hx URI w/ cough, min clear sput, no discoloration or blood, feeling tired, "yucky", aching/ sore, ?swollen glands, wants to sleep; denies CP, SOB, wheezing, etc; notes temp 98.8 today & says "thats a fever for me"... We reviewed her problem list & meds- as above, stable, except she's seen her GYN- Ariel Holmes x6 since Jun2017 w/ UTI x2, uterine prolapse w/ pessary as she declines surg options...      EXAM shows Afeb, VSS, O2sat=99% on RA;  HEENT- neg x sl red, mallampati1;  Chest- clear w/o w/r/r;  Heart- RR w/o m/r/g;  Abd- soft, nontender, neg;  Ext- w/o c/c/e;  Neuro- intact... IMP/PLAN>>  Loree appears to have an upper resp infection, suspect viral but she'd apprec Rx w/ Depo & ZPak- done per request 7 rec to rest w/ fluids, Tylenol, etc...  ~  June 14, 2016:  57mo ROV & Ariel Holmes is c/o some muscle spasm in her back & neck- takes aleve prn & gets accupuncturew/ cupping at [facility]" here in [place] "It sure helps" she says;  Asking about the new shingles vaccine & we discussed the rec for 2 doses of SHINGRIX, even for those who had the Zostavax in the past, Rx written, asked to check w/ her insurance first, 2nd dose is ~14mo from the 1st...  She shared that her 39 y/o son in [place] was Dx w/ Parkinson's dis & doing boxing therapy... We reviewed the following medical problems during today's office visit >>     AR, AB, ex-smoker> on OTC Antihist, Flonase, & Nicorette; she quit smoking 6/12 but still using Nicotine daily & we discussed weaning this "medication"; breathing is good- no cough/ sputum/ SOB, exercising daily.    MVP> on ASA81; she denies CP, palpit, SOB, edema, etc; she is active & doing satis...    Ven Insuffic> she knows to avoid sodium, elev legs, wear support hose...    Chol> on diet alone esp w/ her good HDL; Labs 4/18 showed TChol 201, TG 66, HDL 91, LDL 98    Hypothy> on Synthroid88; Labs 4/18 showed TSH= 1.63; she remains clinically stable but wt is stable at 116# & rec to continue same med...    GI- GERD, Polyps, Hems> on Tagamet400 prn; stable w/o abd pain, dysphagia, n/v, c/d, blood seen; last colon 5/15 by Ariel Holmes showed divertics in sigmoid, melanosis, no recurrent polyps (she rec f/u 10 yrs).    GYN> followed by Ariel Holmes w/ uterine proalpse, she has a pessary (  cleaned & inspected Q1-88mo) and she as seen Urology for PT & kaegel exercises...    Osteopenia> on Calcium, MVI,  VitD2000; she stopped prev Fosamax Rx yrs ago- BMD f/u 05/2016 showed Tscores -1.0 in Lspine & -1.6 in left Femur; Rec to take Calcium, womensMVI, VitD~2000u daily & wt bearing exercise.               Anxiety> husb Ariel Holmes passed away 03-23-2014 after a long illness, she is doing satis & declines anxiolytic meds...    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 EXAM shows Afeb, VSS, O2sat=99% on RA;  HEENT- neg x sl red, mallampati1;  Chest- clear w/o w/r/r;  Heart- RR w/o m/r/g;  Abd- soft, nontender, neg;  Ext- w/o c/c/e;  Neuro- intact...  CXR 06/14/16 (independently reviewed by me in the PACS system) showed norm heart size, mild atherosclerosis of Ao, sl incr markings, otherw clear- NAD...   LABS 06/14/16>  FLP- at goals on diet alone;  Chems- wnl;  CBC- wnl x wbc=12.9;  TSH=1.63 on 63mcg/d...  Bone Density Test  4.30/18> showed Tscores -1.0 in Lspine & -1.6 in left Femur... Rec to take Calcium, womensMVI, VitD~2000u daily & wt bearing exercise. IMP/PLAN>  Yashika is stable w/ mult medcal issues as above-  rec to continue same meds, exercise, wean the nicorette, & call for any problems...    NOTE:  >50% of this 30 min ROV was spent in counseling 7 coordination of care...  ~  December 14, 2016:  84mo ROV & Ariel Holmes describes a recent tri[p to [place]- she did well medically & had a blast... She tripped at home 7/1, hit right side of head w/ black eye, went to ER & they used superglue;  She reports feeling OK- notes some right shoulder pain & sh'e asking about PT but I mentuioned Ortho eval 1st...  We reviewed the following medical problems during today's office visit >>     AR, AB, ex-smoker> on OTC Antihist, Flonase, & Nicorette; she quit smoking 6/12 but still using Nicotine daily! & we discussed weaning this "medication"; breathing is good- no cough/ sputum/ SOB, exercising daily.    MVP> on ASA81; she denies  CP, palpit, SOB, edema, etc; she is active & doing satis; as noted she still chews Nicorette gum daily, and she drinks 4+ caffeine beverages daily as well- advised to wean off both!    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    VV & Ven Insuffic> she knows to avoid sodium, elev legs, wear support hose; she is an avid walker...    Chol> on diet alone esp w/ her good HDL; Labs 4/18 showed TChol 201, TG 66, HDL 91, LDL 98    Hypothy> on Synthroid88; Labs 4/18 showed TSH= 1.63; she remains clinically stable but wt is stable at 115# & rec to continue same med...    GI- GERD, Polyps, Hems> on Tagamet400 prn; stable w/o abd pain, dysphagia, n/v, c/d, blood seen; last colon 5/15 by Ariel Holmes showed divertics in sigmoid, melanosis, no recurrent polyps (she rec f/u 10 yrs).    GYN> followed by Ariel Holmes w/ uterine proalpse, she has a pessary (cleaned & inspected Q1-8mo) and she as seen Urology for PT & kaegel exercises...    DJD> c/o right shoulder  pain & she uses OTC aleve which helps, offered Ortho referral when she is ready...    Osteopenia> on Calcium, MVI, VitD2000; she stopped prev Fosamax Rx yrs ago- BMD f/u 05/2016 showed Tscores -1.0 in Lspine & -1.4 in left Femur; Rec to take Calcium, womensMVI, VitD~2000u daily & wt bearing exercise.              Anxiety> husb Ariel Holmes passed away 03-11-2014 after a long illness, she is doing satis & declines anxiolytic meds...    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 EXAM shows Afeb, VSS, O2sat=98% on RA;  HEENT- neg x sl red, mallampati1;  Chest- clear w/o w/r/r;  Heart- RR w/o m/r/g;  Abd- soft, nontender, neg;  Ext- w/o c/c/e;  Neuro- intact... IMP/PLAN>>  Gilberto is stable overall w/ mult medical issues above; she is asked to  decr Nicorette & caffeine, stay as active as poss, consider Ortho eval of right shoulder pain;  She's had the 2018 FLU vaccine;  We plan ROV recheck in 49m, sooner if needed prn...   ~  January 17, 2017:  7mo ROV & add-on appt requested by pt & GYN for a cardiac irregularity detected at [facility] office 12/31/16;  Pt says she notes brief palpit ~once per month- on 12/31/16 she was at GYN office for her routine pessary change & Ariel Holmes noted irreg heartbeat=> they suggested pt f/u w/ [place];  Pt drinks 4 cups of coffee/d, still chews nicorette gum years after quitting smoking (doen't know how much but it's a lot & every day), she'd had routine mammogram that day, did some [employer] shopping, then the pessary change, says she hadn't eaten yet;  In GYN office she felt jittery "I'm a hyper person" but denies noting skipping/ racing/ fluttering; GYN did not do EKG... Pt has Hx mild MVP based on 2DEcho from yrs ago (not in epic)... After the phone call 12/31/16 we set up 2DEcho=> done 01/04/17 see below...    SEE PROB LIST above- reviewed...  EXAM shows Afeb, VSS, O2sat=97% on RA;  HEENT- neg x sl red, mallampati1;  Chest- clear w/o w/r/r;  Heart- RR w/o m/r/g;  Abd- soft, nontender, neg;  Ext- w/o c/c/e;  Neuro- intact...  EKG 01/17/17 shows SBrady, rate53, WNL, NAD...   2DEcho done 01/04/17 showed norm LV size & function w/ EF=60-65% and no RWMA, Gr1DD, MV- mildly thickened leaflets, mild MR, mild LA dil at 14mm, norm RV & PA... IMP/PLAN>>  We discussed her findings and offered further eval w/ Holter vs Event Recorder, or consider a conservative approach by decr caffeine, decr nicotine gum, and careful observation approach;  She prefers the latter & she is once again admonished to decr her caffeine (switch to decaf), and decr the nicorette gum!  NOTE:  >50% of this 56min rov was spent in counseling & coordination of care...           PROBLEM LIST:    ALLERGIC RHINITIS - uses FLONASE Prn...  ASTHMATIC BRONCHITIS,  ACUTE (ICD-466.0) - no recent bronchitic exac... not on regular meds. ~  CXR 12/09 showed hyperinflation & incr markings, spondylosis in TSpine... 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 ~  CXR 3/13 showed heart size is wnl, lungs clear x sl incr markings, NAD. ~  CXR 3/14 showed normal heart size, calcif & ectasia of Ao, clear lungs, DJD/ sl scoliosis of spine/ osteopenia. ~  CXR 3/15 showed norm heart size, clear lungs, mild DJD spine, no lesions/ NAD. ~  PTF 3/15 showed FVC=2.00 (76%), FEV1=1.56 (77%), %1sec=78%, mid-flows=75% predicted... Interpret: No signif obstruction, can't r/o mild restriction w/o lung vol measurement ~  CXR 4/16 showed norm heart size, clear lungs, NAD. 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 ~  Still using nicorette gum daily 7 asked to wean down...  MITRAL VALVE PROLAPSE, HX OF (ICD-V12.50) - on ASA 81mg /d... hx mild MVP on 2DEcho in 1995 (no signif MR)... asymptomatic without CP or palpit... 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 she knows to avoid sodium, elevate legs, wear support hose, etc...  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 Keep up the good work. ~  [place] 3/14 on diet alone showed TChol 228, TG 92, HDL 88, LDL 109 ~  FLP 3/15 on diet alone showed TChol 209, TG 78, HDL 83, LDL 105  ~  FLP 4/16 on diet alone showed TChol 196, TG 93, HDL 76, LDL 101  ~  FLP 4/17 on diet alone showed  TChol 200, TG 74, HDL 87, LDL 98  HYPOTHYROIDISM (ICD-244.9) - on LEVOTHYROID 11mcg/d... ~  labs 12/09 showed TSH= 0.44 ~  labs 1/11 showed TSH= 0.66 ~  labs 1/12 showed TSH= 0.85 ~  Labs 3/13 on Levo100 showed TSH= 0.71 ~  Labs 3/14 on Levo100 showed TSH=0.42 ~  Labs 3/15 on Levo100 showed TSH= 0.43 ~  Labs 4/16 on Levo100 showing TSH= 0.89 ~  Labs 4/17 on Levo100 showed TSH=0.28, her wt is down to 116# & we decided to decr the Synthroid dose to 60mcg/d...  ESOPHAGEAL REFLUX (ICD-530.81) - she uses Cimetadine 300mg  Prn for "Stomach" & requests refill from [place]...  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 ~  colonoscopy 4/00 by Ariel Holmes showed several 6-64mm polyps= adenomatous... ~  colonoscopy 4/05 by Ariel Holmes showed only melanosis and hems... ~  colonoscopy 5/10 Ariel Holmes showed mild divertics, 2 cecal polyps- tubular adenoma, f/u 43yrs. ~  colonoscopy 5/15 Ariel Holmes showed divertics in sigmoid, melanosis, no recurrent polyps (she rec f/u 10 yrs).  OSTEOPENIA (ICD-733.90) - on calcium, MVI, Vit D 2000 u daily... she stopped her prev Fosamax rx on her own in 2011. ~  BMD here 8/07 showed TScores -1.2 in Spine, and -1.8 in left FemNeck. ~  labs 8/08 showed Vit D level= 38 ~  BMD here 9/09 showed TScores -0.8 in Spine, and -1.5 in left FemNeck. ~  Labs 1/11 showed Vit D level = 51 ~  Labs 3/14 showed Vit D level = 60 ~  Labs 3/15 showed Vit D level = 79 ~  BMD 3/15 showed Tscore -1.4 & rec to continue MVI, VitD, wt bearing exercises.  ANXIETY (ICD-300.00) - not currently on meds.  MELANOMA >>  Diagnosed w/ Lentigo Maligna Melanoma on inner left thigh 11/12 by Brunetta Jeans;  Wider excision w/ neg path 11/12 by DrThompson;  Oncology review by Ariel Holmes & no plans for adjuvant therapy... ~  She reports on-going follow up from [place] ?every 3-69mo? ~  11/13: she saw Ariel Holmes for follow up> on observation, doing well w/o recurrence...  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 ~  She continues to f/u w/ Ariel Holmes yearly...  Health Maintenance: ~  neg [facility] at [facility]- followed yearly... due for GYN check by [facility] & she will call. ~  Immunizations:  she gets the yearly seasonal Flu vaccines... she received Shingles vaccine 2/09 at [facility]... had PNEUMOVAX in 2002 & 1/12 (age 61)... given TDAP 1/12.   Past Surgical History:  Procedure Laterality Date  . 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 Patient does not remember exact date of surgery.  . [place]  . COLONOSCOPY    . MELANOMA EXCISION  12/31/2010   Procedure: MELANOMA EXCISION;  Surgeon: Zenovia Jarred, MD;  Location: [facility];  Service: General;  Laterality: Left;  wide excision melanoma left thigh, excision lesion left thigh  . OTHER SURGICAL HISTORY Right    Surgical repair of architectual distortion Right Breast- Hyperplasia  . TONSILLECTOMY      Outpatient Encounter Medications as of 01/17/2017  Medication Sig  . acyclovir ointment (ZOVIRAX) 5 % See admin instructions. Prn cold sores  . aspirin 81 MG tablet Take 81 mg by mouth daily.    . AZO-CRANBERRY PO Take by mouth daily.  . Calcium Carbonate-Vitamin D (CALCIUM + D PO) Take by mouth daily.    . Cholecalciferol (VITAMIN D) 2000 UNITS CAPS Take 1 capsule by mouth daily.  . cimetidine (TAGAMET) 400 MG tablet TAKE 1 TABLET BY MOUTH TWICE A DAY (Patient taking differently: prn)  . conjugated estrogens (PREMARIN) vaginal cream Use 1/2 g vaginally with each pessary cleaning.  . fluticasone (FLONASE) 50 MCG/ACT nasal spray PLACE 2 SPRAYS INTO BOTH NOSTRILS AS NEEDED.  [name] levothyroxine (SYNTHROID, LEVOTHROID) 88 MCG tablet Take 1 tablet (88 mcg total) by mouth daily before breakfast.  .  LUTEIN PO Take 1 tablet by mouth daily.  . Multiple Vitamin (MULTIVITAMIN) tablet Take 1 tablet by mouth daily.    . nicotine polacrilex (NICORETTE) 2 MG gum Take 2 mg by mouth as needed. Not smoking  . NONFORMULARY OR COMPOUNDED ITEM Vaginal estradiol cream 0.02%.  Place 1/2 gram per vagina at bedtime every other night for 10 days, then place 1/2 gram per vaginal at bedtime twice weekly.   No facility-administered encounter medications on file as of 01/17/2017.     Allergies  Allergen Reactions  . Codeine Nausea And Vomiting  . Ciprofloxacin     Musculskeletal pain.  . Sulfur Nausea Only    Immunization History  Administered Date(s) Administered  . H1N1 01/18/2008  . Influenza Split 11/16/2010, 11/29/2011  . Influenza Whole 10/28/2009  . Influenza, High Dose Seasonal PF 12/16/2015, 12/03/2016  . Influenza,inj,Quad PF,6+ Mos 11/14/2012, 11/28/2013, 11/29/2014  . Influenza-Unspecified 12/04/2014  . Pneumococcal Conjugate-13 06/12/2015  . Pneumococcal Polysaccharide-23 03/11/2010  . Td 03/11/2010  . Tdap 08/14/2016  . Zoster 03/01/2007, 09/11/2016  . Zoster Recombinat (Shingrix) 06/14/2016    Current Medications, Allergies, Past  Medical History, Past Surgical History, Family History, and Social History were reviewed in [employer] record.   Review of Systems         The patient prev complained of fatigue, dyspnea on exertion, cough, gas/bloating, incontinence, joint pain, arthritis, and anxiety.  The patient denies fever, chills, sweats, anorexia, weakness, malaise, weight loss, sleep disorder, blurring, diplopia, eye irritation, eye discharge, vision loss, eye pain, photophobia, earache, ear discharge, tinnitus, decreased hearing, nasal congestion, nosebleeds, sore throat, hoarseness, chest pain, palpitations, syncope, orthopnea, PND, peripheral edema, dyspnea at rest, excessive sputum, hemoptysis, wheezing, pleurisy, nausea, vomiting, diarrhea, constipation,  change in bowel habits, abdominal pain, melena, hematochezia, jaundice, indigestion/heartburn, dysphagia, odynophagia, dysuria, hematuria, urinary frequency, urinary hesitancy, nocturia, back pain, joint swelling, muscle cramps, muscle weakness, stiffness, sciatica, restless legs, leg pain at night, leg pain with exertion, rash, itching, dryness, suspicious lesions, paralysis, paresthesias, seizures, tremors, vertigo, transient blindness, frequent falls, frequent headaches, difficulty walking, depression, memory loss, confusion, cold intolerance, heat intolerance, polydipsia, polyphagia, polyuria, unusual weight change, abnormal bruising, bleeding, enlarged lymph nodes, urticaria, allergic rash, hay fever, and recurrent infections.     Objective:   Physical Exam     WD, WN, 76 y/o WF in NAD...  GENERAL:  Alert & oriented; pleasant & cooperative... HEENT:  /AT, EOM-wnl, PERRLA, EACs-clear, TMs-wnl, NOSE-clear, THROAT-clear & wnl. NECK:  Supple w/ fairROM; no JVD; normal carotid impulses w/ faint right bruit; no thyromegaly or nodules palpated; no lymphadenopathy. CHEST:  Clear to P & A; without wheezes/ rales/ or rhonchi. HEART:  Regular Rhythm; without murmurs/ rubs/ or gallops. ABDOMEN:  Soft & nontender; normal bowel sounds; no organomegaly or masses detected. EXT: without deformities or arthritic changes; mild varicose veins/ venous insuffic/ tr edema. Scar on inner left thigh from melanoma surg, no palp adenopathy... NEURO:  CN's intact; motor testing normal; sensory testing normal; gait normal & balance OK. DERM:  No lesions noted; no rash etc...  RADIOLOGY DATA:  Reviewed in the EPIC EMR & discussed w/ the patient...   LABORATORY DATA:  Reviewed in the EPIC EMR & discussed w/ the patient...    Assessment:      12/14/16>   Idalia is stable overall w/ mult medical issues abouve; she is asked to decr Nicoreete & caffeine, stasy as active as poss, consider Ortho eval of right shoulder  pain;  She's had the 2018 FLU vaccine;  We plan ROV recheck in 32m, sooner if needed prn 01/17/17>   We discussed her findings and offered further eval w/ Holter vs Event Recorder, or consider a conservative approach by decr caffeine, decr nicotine gum, and careful observation approach;  She prefers the latter & she is once again admonished to decr her caffeine (switch to decaf), and decr the nicorette gum!   Viral URI>  Similar to the below viral syndrome from [id number]- we will Rx w/ Depo80, ZPak, she declines dosepak ("I don't do well w/ pred")... Post-viral syndrome>  Returned from trip to [facility] 07/08/15 w/ URI, viral syndrome & protracted symptoms- given ZPak, then Depo80 + OTC meds and slowly improving. AB, ex-smoker>  She quit smoking 6/12 on a bet w/ her uncle; & she has remain quit!  No resp exac & stable, no meds needed... ~  Asked to wean off the nicorette gum!  Hx MVP>  On ASA, she is asymptomatic w/o CP, palpit, etc... ~  CDoppler 07/2015 w/ mild plaque, no signif ICA stenoses...  CHOL>  FLP is much improved on diet &  she will not need meds at this time...  Hypothyroid>  On Levothy88 w/ TSH= 1.63 in April2018, continue same...  GERD>  She uses OTC Tagamet as needed (her preference)...  Divertics, Colon Polyps>  She is up to date on colon screening w/ no polyps seen 5/15...  Osteopenia>  On Calcium, MVI, Vit D; she stopped her Fosamax on her own in 2011 & f/u BMD 05/2016 showed lowest Tscore -1.6 in left Femur- on Ca, MVI, VitD, wt bearing exercise.  Anxiety>  She does not want anxiolytic therapy...  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     Plan:       Medication List        Accurate as of 01/17/17  2:59 PM. Always use your most recent med list.          acyclovir ointment 5 % Commonly known as:  ZOVIRAX   aspirin 81  MG tablet   AZO-CRANBERRY PO   CALCIUM + D PO   cimetidine 400 MG tablet Commonly known as:  TAGAMET TAKE 1 TABLET BY MOUTH TWICE A DAY   conjugated estrogens vaginal cream Commonly known as:  PREMARIN Use 1/2 g vaginally with each pessary cleaning.   fluticasone 50 MCG/ACT nasal spray Commonly known as:  FLONASE PLACE 2 SPRAYS INTO BOTH NOSTRILS AS NEEDED.   levothyroxine 88 MCG tablet Commonly known as:  SYNTHROID, LEVOTHROID Take 1 tablet (88 mcg total) by mouth daily before breakfast.   LUTEIN PO   multivitamin tablet   nicotine polacrilex 2 MG gum Commonly known as:  NICORETTE   NONFORMULARY OR COMPOUNDED ITEM Vaginal estradiol cream 0.02%.  Place 1/2 gram per vagina at bedtime every other night for 10 days, then place 1/2 gram per vaginal at bedtime twice weekly.   Vitamin D 2000 units Caps

## 2017-01-17 NOTE — Patient Instructions (Signed)
Today we updated your med list in our EPIC system...    Continue your current medications the same...  We discussed the options for a Holter monitor or an Event recorder...  We decided on a conservastive approach--     Watch for any skipping racing fluttering or irregularity in your heartbeat...    Watch for any dizziness, lightheadedness, etc...    Back off on the CAFFEINE & the Nicotine in the nicorette gum!!!  Call for any questions or if I can be of service in any way.Marland KitchenMarland Kitchen

## 2017-01-31 ENCOUNTER — Ambulatory Visit: Payer: Medicare Other | Admitting: Obstetrics and Gynecology

## 2017-01-31 ENCOUNTER — Encounter: Payer: Self-pay | Admitting: Obstetrics & Gynecology

## 2017-01-31 ENCOUNTER — Ambulatory Visit: Payer: Medicare Other | Admitting: Obstetrics & Gynecology

## 2017-01-31 VITALS — BP 128/66 | HR 80 | Resp 16 | Ht 62.0 in | Wt 116.0 lb

## 2017-01-31 DIAGNOSIS — N812 Incomplete uterovaginal prolapse: Secondary | ICD-10-CM | POA: Diagnosis not present

## 2017-01-31 DIAGNOSIS — N8111 Cystocele, midline: Secondary | ICD-10-CM

## 2017-01-31 DIAGNOSIS — N898 Other specified noninflammatory disorders of vagina: Secondary | ICD-10-CM

## 2017-01-31 DIAGNOSIS — N765 Ulceration of vagina: Secondary | ICD-10-CM

## 2017-01-31 NOTE — Progress Notes (Signed)
GYNECOLOGY  VISIT  CC:   Pessary check  HPI: 76 y.o. G49P1001 Widowed Caucasian female here for pessary check.  Typically sees Dr. Quincy Simmonds about monthly for pessary check.  If leaves in for longer than that, will start to have issues with rubbing and discharge/bleeding.  Denies vaginal bleeding, currently.  Has recently had some vaginal odor as well.  Has fairly good continence unless waits too long to go to the bathroom.    GYNECOLOGIC HISTORY: Patient's last menstrual period was 02/16/1988 (approximate). Contraception: post menopausal  Menopausal hormone therapy: vaginal estradiol   Patient Active Problem List   Diagnosis Date Noted  . Intermittent palpitations 01/17/2017  . Right shoulder pain 12/14/2016  . Vertigo 04/02/2016  . Right carotid bruit 08/01/2015  . Uterine prolapse 06/13/2013  . Cystocele 06/13/2013  . Melanoma of thigh ([place]) 02/15/2011  . ESOPHAGEAL REFLUX 03/24/2010  . 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 HYPERCHOLESTEROLEMIA, BORDERLINE, WITH HIGH HDL 01/18/2008  . Disorder of bone and cartilage 01/18/2008  . COLONIC POLYPS 01/17/2008  . Hypothyroidism 01/17/2008  . Anxiety state 01/17/2008  . History of cardiovascular disorder 01/17/2008    Past Medical History:  Diagnosis Date  . COPD (chronic obstructive pulmonary disease) ([place])   . Hx of colonic polyps   . Hypercholesterolemia   . Hypothyroidism   . Melanoma of thigh ([place]) 02/15/2011  . MVP (mitral valve prolapse)   . Osteopenia   . Prolapsed uterus   . Varicose vein     Past Surgical History:  Procedure Laterality Date  . 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 Patient does not remember exact date of surgery.  . [place]  . MELANOMA EXCISION  12/31/2010   Procedure: MELANOMA EXCISION;  Surgeon: Zenovia Jarred, MD;  Location: [facility];  Service: General;  Laterality: Left;  wide excision melanoma left thigh, excision lesion left thigh  . OTHER  SURGICAL HISTORY Right    Surgical repair of architectual distortion Right Breast- Hyperplasia  . TONSILLECTOMY      MEDS:   Current Outpatient Medications on File Prior to Visit  Medication Sig Dispense Refill  . acyclovir ointment (ZOVIRAX) 5 % See admin instructions. Prn cold sores  0  . aspirin 81 MG tablet Take 81 mg by mouth daily.      . AZO-CRANBERRY PO Take by mouth daily.    . Calcium Carbonate-Vitamin D (CALCIUM + D PO) Take by mouth daily.      . Cholecalciferol (VITAMIN D) 2000 UNITS CAPS Take 1 capsule by mouth daily.    . cimetidine (TAGAMET) 400 MG tablet TAKE 1 TABLET BY MOUTH TWICE A DAY (Patient taking differently: prn) 60 tablet 0  . conjugated estrogens (PREMARIN) vaginal cream Use 1/2 g vaginally with each pessary cleaning. 30 g 1  . fluticasone (FLONASE) 50 MCG/ACT nasal spray PLACE 2 SPRAYS INTO BOTH NOSTRILS AS NEEDED. 16 g 5  . levothyroxine (SYNTHROID, LEVOTHROID) 88 MCG tablet Take 1 tablet (88 mcg total) by mouth daily before breakfast. 30 tablet 11  . LUTEIN PO Take 1 tablet by mouth daily.    . Multiple Vitamin (MULTIVITAMIN) tablet Take 1 tablet by mouth daily.      . nicotine polacrilex (NICORETTE) 2 MG gum Take 2 mg by mouth as needed. Not smoking    . NONFORMULARY OR COMPOUNDED ITEM Vaginal estradiol cream 0.02%.  Place 1/2 gram per vagina at bedtime every other night for 10 days, then place  1/2 gram per vaginal at bedtime twice weekly. (Patient not taking: Reported on 01/31/2017) 8 each 10   No current facility-administered medications on file prior to visit.     ALLERGIES: Codeine; Ciprofloxacin; and Sulfur  Family History  Problem Relation Age of Onset  . Hypertension Mother   . Congestive Heart Failure Mother   . Thyroid disease Mother   . Stroke Father   . Heart attack Father 34  . Diabetes Paternal Grandmother   . Diabetes Paternal Grandfather   . Hypertension Maternal Grandfather   . CVA Maternal Grandfather   . Esophageal cancer Neg Hx    . Rectal cancer Neg Hx   . Stomach cancer Neg Hx   . Colon cancer Neg Hx     SH:  Widowed, non smoker  Review of Systems  All other systems reviewed and are negative.   PHYSICAL EXAMINATION:    BP 128/66 (BP Location: Right Arm, Patient Position: Sitting, Cuff Size: Normal)   Pulse 80   Resp 16   Ht 5\' 2"  (1.575 m)   Wt 116 lb (52.6 kg)   LMP 02/16/1988 (Approximate)   BMI 21.22 kg/m     General appearance: alert, cooperative and appears stated age Abdomen: soft, non-tender; bowel sounds normal; no masses,  no organomegaly Inguinal:  In NOAD  Pelvic: External genitalia:  no lesions              Urethra:  normal appearing urethra with no masses, tenderness or lesions              Bartholins and Skenes: normal                 Vagina:pressure ulceration underneath cervix after pessary removal.  Pessary was cleansed.              Cervix: no lesions              Bimanual Exam:  Uterus:  normal size, contour, position, consistency, mobility, non-tender              Adnexa: no mass, fullness, tenderness              Anus:  normal sphincter tone, no lesions  Chaperone was present for exam.  Assessment: Incomplete uterine prolapse Cystocele Vaginal discharge and odor Pressure ulcreation  Plan: Will leave pessary out for now and pt will return in two weeks for recheck.

## 2017-02-18 ENCOUNTER — Other Ambulatory Visit: Payer: Self-pay

## 2017-02-18 ENCOUNTER — Ambulatory Visit: Payer: Medicare Other | Admitting: Obstetrics & Gynecology

## 2017-02-18 ENCOUNTER — Encounter: Payer: Self-pay | Admitting: Obstetrics & Gynecology

## 2017-02-18 VITALS — BP 144/76 | HR 60 | Resp 16 | Ht 62.0 in | Wt 117.0 lb

## 2017-02-18 DIAGNOSIS — N8111 Cystocele, midline: Secondary | ICD-10-CM

## 2017-02-18 DIAGNOSIS — N765 Ulceration of vagina: Secondary | ICD-10-CM | POA: Diagnosis not present

## 2017-02-18 NOTE — Progress Notes (Signed)
GYNECOLOGY  VISIT  CC:   H/o vaginal ulceration  HPI: 77 y.o. G32P1001 Widowed Caucasian female here for recheck after pessary was removed due to vaginal ulceration.  Pt reports she did have some discharge for a few days after pessary was removed.  Has had no bleeding or discharge since that time.  Denies urinary symptoms.  Has been able to void without difficulty.  GYNECOLOGIC HISTORY: Patient's last menstrual period was 02/16/1988 (approximate). Contraception: post menopausal Menopausal hormone therapy: premarin cream  Patient Active Problem List   Diagnosis Date Noted  . Intermittent palpitations 01/17/2017  . Right shoulder pain 12/14/2016  . Vertigo 04/02/2016  . Right carotid bruit 08/01/2015  . Uterine prolapse 06/13/2013  . Cystocele 06/13/2013  . Melanoma of thigh ([place]) 02/15/2011  . ESOPHAGEAL REFLUX 03/24/2010  . 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 HYPERCHOLESTEROLEMIA, BORDERLINE, WITH HIGH HDL 01/18/2008  . Disorder of bone and cartilage 01/18/2008  . COLONIC POLYPS 01/17/2008  . Hypothyroidism 01/17/2008  . Anxiety state 01/17/2008  . History of cardiovascular disorder 01/17/2008    Past Medical History:  Diagnosis Date  . COPD (chronic obstructive pulmonary disease) ([place])   . Hx of colonic polyps   . Hypercholesterolemia   . Hypothyroidism   . Melanoma of thigh ([place]) 02/15/2011  . MVP (mitral valve prolapse)   . Osteopenia   . Prolapsed uterus   . Varicose vein     Past Surgical History:  Procedure Laterality Date  . 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 Patient does not remember exact date of surgery.  . [place]  . MELANOMA EXCISION  12/31/2010   Procedure: MELANOMA EXCISION;  Surgeon: Zenovia Jarred, MD;  Location: [facility];  Service: General;  Laterality: Left;  wide excision melanoma left thigh, excision lesion left thigh  . OTHER SURGICAL HISTORY Right    Surgical repair of architectual distortion  Right Breast- Hyperplasia  . TONSILLECTOMY      MEDS:   Current Outpatient Medications on File Prior to Visit  Medication Sig Dispense Refill  . acyclovir ointment (ZOVIRAX) 5 % See admin instructions. Prn cold sores  0  . aspirin 81 MG tablet Take 81 mg by mouth daily.      . AZO-CRANBERRY PO Take by mouth daily.    . Calcium Carbonate-Vitamin D (CALCIUM + D PO) Take by mouth daily.      . Cholecalciferol (VITAMIN D) 2000 UNITS CAPS Take 1 capsule by mouth daily.    . cimetidine (TAGAMET) 400 MG tablet TAKE 1 TABLET BY MOUTH TWICE A DAY (Patient taking differently: prn) 60 tablet 0  . fluticasone (FLONASE) 50 MCG/ACT nasal spray PLACE 2 SPRAYS INTO BOTH NOSTRILS AS NEEDED. 16 g 5  . levothyroxine (SYNTHROID, LEVOTHROID) 88 MCG tablet Take 1 tablet (88 mcg total) by mouth daily before breakfast. 30 tablet 11  . LUTEIN PO Take 1 tablet by mouth daily.    . Multiple Vitamin (MULTIVITAMIN) tablet Take 1 tablet by mouth daily.      . nicotine polacrilex (NICORETTE) 2 MG gum Take 2 mg by mouth as needed. Not smoking    . NONFORMULARY OR COMPOUNDED ITEM Vaginal estradiol cream 0.02%.  Place 1/2 gram per vagina at bedtime every other night for 10 days, then place 1/2 gram per vaginal at bedtime twice weekly. 8 each 10   No current facility-administered medications on file prior to visit.     ALLERGIES: Codeine; Ciprofloxacin; and Sulfur  Family History  Problem Relation Age of Onset  . Hypertension Mother   . Congestive Heart Failure Mother   . Thyroid disease Mother   . Stroke Father   . Heart attack Father 2  . Diabetes Paternal Grandmother   . Diabetes Paternal Grandfather   . Hypertension Maternal Grandfather   . CVA Maternal Grandfather   . Esophageal cancer Neg Hx   . Rectal cancer Neg Hx   . Stomach cancer Neg Hx   . Colon cancer Neg Hx     SH:  Widowed, non smoker  Review of Systems  All other systems reviewed and are negative.   PHYSICAL EXAMINATION:    BP (!)  144/76 (BP Location: Right Arm, Patient Position: Sitting, Cuff Size: Normal)   Pulse 60   Resp 16   Ht 5\' 2"  (1.575 m)   Wt 117 lb (53.1 kg)   LMP 02/16/1988 (Approximate)   BMI 21.40 kg/m     General appearance: alert, cooperative and appears stated age  Pelvic: External genitalia:  no lesions              Urethra:  normal appearing urethra with no masses, tenderness or lesions              Bartholins and Skenes: normal                 Vagina: normal appearing vagina with normal color and discharge, lesion has resolved              Cervix: no lesions              Bimanual Exam:  Uterus:  normal size, contour, position, consistency, mobility, non-tender              Adnexa: no mass, fullness, tenderness   Pessary replaced without difficulty.    Chaperone was present for exam.  Assessment: Resolved vaginal ulceration from pessary.  Plan: Consider trial of smaller pessary at the next visit and will do fitting at that time.  Feel should decrease one size down.  Pt does not want to do this today.  Will be seen in one month. Comfortable with plan.   ~15 minutes spent with patient >50% of time was in face to face discussion of above.

## 2017-03-29 ENCOUNTER — Encounter: Payer: Self-pay | Admitting: Obstetrics & Gynecology

## 2017-03-29 ENCOUNTER — Other Ambulatory Visit: Payer: Self-pay

## 2017-03-29 ENCOUNTER — Ambulatory Visit: Payer: Medicare Other | Admitting: Obstetrics & Gynecology

## 2017-03-29 VITALS — BP 134/76 | HR 66 | Resp 14 | Ht 62.0 in | Wt 116.0 lb

## 2017-03-29 DIAGNOSIS — N8111 Cystocele, midline: Secondary | ICD-10-CM | POA: Diagnosis not present

## 2017-03-29 NOTE — Progress Notes (Signed)
76 y.o. Widowed White female G1P1001 here for pessary check.  She reports she is having a little bit of discharge but has no odor.  Declines vaginal bleeding.  Denies any new urinary symptoms.  She has just been to the dentist today and she doesn't really want to be here today.  She is sexually active.    Patient has been using following pessary style and size:  #3 incontinence ring with support.  Review of Systems  Constitutional: Negative.   Gastrointestinal: Negative.   Genitourinary: Negative.    :  Exam:   BP 134/76 (BP Location: Left Arm, Patient Position: Sitting, Cuff Size: Normal)   Pulse 66   Resp 14   Ht 5\' 2"  (1.575 m)   Wt 116 lb (52.6 kg)   LMP 02/16/1988 (Approximate)   BMI 21.22 kg/m   General appearance: alert, cooperative and no distress Inguinal lymph nodes:  not enlarged  Pelvic: External genitalia:  no lesions              Urethra: normal appearing urethra with no masses, tenderness or lesions              Bartholins and Skenes: Bartholin's, Urethra, Skene's normal                 Vagina: normal appearing vagina with normal color and discharge, no lesions              Cervix: normal appearance   Pap obtained:  no Bimanual Exam:  Uterus:  uterus is normal size, shape, consistency and nontender                               Adnexa:    normal adnexa in size, nontender and no masses                               Pessary was removed without difficulty.  No vaginal ulcerations or granulation tissue noted.  Pessary was cleansed.  Estrogen cream applied to vaginal apex and to pessary before pessary was replaced. Patient tolerated procedure well.    A: Cystocele Pessary use  P:  Return one month.  Pt knows to call with any new issues/concerns.

## 2017-04-29 ENCOUNTER — Ambulatory Visit: Payer: Medicare Other | Admitting: Obstetrics & Gynecology

## 2017-04-29 ENCOUNTER — Encounter: Payer: Self-pay | Admitting: Obstetrics & Gynecology

## 2017-04-29 VITALS — BP 134/60 | HR 76 | Resp 14 | Wt 117.0 lb

## 2017-04-29 DIAGNOSIS — T8389XA Other specified complication of genitourinary prosthetic devices, implants and grafts, initial encounter: Secondary | ICD-10-CM | POA: Diagnosis not present

## 2017-04-29 DIAGNOSIS — N898 Other specified noninflammatory disorders of vagina: Secondary | ICD-10-CM | POA: Diagnosis not present

## 2017-04-29 DIAGNOSIS — N8111 Cystocele, midline: Secondary | ICD-10-CM | POA: Diagnosis not present

## 2017-04-29 NOTE — Progress Notes (Signed)
GYNECOLOGY  VISIT  CC:   Pessary check  HPI: 77 y.o. G29P1001 Widowed Caucasian female here for pessary check.  Having some increased discharge.  No urinary symptoms.  Denies vaginal bleeding.    Using #3 incontinence ring with support.  GYNECOLOGIC HISTORY: Patient's last menstrual period was 02/16/1988 (approximate). Contraception: post menopausal  Menopausal hormone therapy: none  Patient Active Problem List   Diagnosis Date Noted  . Intermittent palpitations 01/17/2017  . Right shoulder pain 12/14/2016  . Vertigo 04/02/2016  . Right carotid bruit 08/01/2015  . Uterine prolapse 06/13/2013  . Cystocele 06/13/2013  . Melanoma of thigh ([place]) 02/15/2011  . ESOPHAGEAL REFLUX 03/24/2010  . 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 HYPERCHOLESTEROLEMIA, BORDERLINE, WITH HIGH HDL 01/18/2008  . Disorder of bone and cartilage 01/18/2008  . COLONIC POLYPS 01/17/2008  . Hypothyroidism 01/17/2008  . Anxiety state 01/17/2008  . History of cardiovascular disorder 01/17/2008    Past Medical History:  Diagnosis Date  . COPD (chronic obstructive pulmonary disease) ([place])   . Hx of colonic polyps   . Hypercholesterolemia   . Hypothyroidism   . Melanoma of thigh ([place]) 02/15/2011  . MVP (mitral valve prolapse)   . Osteopenia   . Prolapsed uterus   . Varicose vein     Past Surgical History:  Procedure Laterality Date  . 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 Patient does not remember exact date of surgery.  . [place]  . MELANOMA EXCISION  12/31/2010   Procedure: MELANOMA EXCISION;  Surgeon: Zenovia Jarred, MD;  Location: [facility];  Service: General;  Laterality: Left;  wide excision melanoma left thigh, excision lesion left thigh  . OTHER SURGICAL HISTORY Right    Surgical repair of architectual distortion Right Breast- Hyperplasia  . TONSILLECTOMY      MEDS:   Current Outpatient Medications on File Prior to Visit  Medication Sig  Dispense Refill  . acyclovir ointment (ZOVIRAX) 5 % See admin instructions. Prn cold sores  0  . aspirin 81 MG tablet Take 81 mg by mouth daily.      . AZO-CRANBERRY PO Take by mouth daily.    . Calcium Carbonate-Vitamin D (CALCIUM + D PO) Take by mouth daily.      . Cholecalciferol (VITAMIN D) 2000 UNITS CAPS Take 1 capsule by mouth daily.    . cimetidine (TAGAMET) 400 MG tablet TAKE 1 TABLET BY MOUTH TWICE A DAY (Patient taking differently: prn) 60 tablet 0  . fluticasone (FLONASE) 50 MCG/ACT nasal spray PLACE 2 SPRAYS INTO BOTH NOSTRILS AS NEEDED. 16 g 5  . levothyroxine (SYNTHROID, LEVOTHROID) 88 MCG tablet Take 1 tablet (88 mcg total) by mouth daily before breakfast. 30 tablet 11  . LUTEIN PO Take 1 tablet by mouth daily.    . Multiple Vitamin (MULTIVITAMIN) tablet Take 1 tablet by mouth daily.      . nicotine polacrilex (NICORETTE) 2 MG gum Take 2 mg by mouth as needed. Not smoking    . NONFORMULARY OR COMPOUNDED ITEM Vaginal estradiol cream 0.02%.  Place 1/2 gram per vagina at bedtime every other night for 10 days, then place 1/2 gram per vaginal at bedtime twice weekly. 8 each 10   No current facility-administered medications on file prior to visit.     ALLERGIES: Codeine; Ciprofloxacin; and Sulfur  Family History  Problem Relation Age of Onset  . Hypertension Mother   . Congestive Heart Failure Mother   . Thyroid  disease Mother   . Stroke Father   . Heart attack Father 5  . Diabetes Paternal Grandmother   . Diabetes Paternal Grandfather   . Hypertension Maternal Grandfather   . CVA Maternal Grandfather   . Esophageal cancer Neg Hx   . Rectal cancer Neg Hx   . Stomach cancer Neg Hx   . Colon cancer Neg Hx     SH:  Widowed, non smoker  Review of Systems  All other systems reviewed and are negative.   PHYSICAL EXAMINATION:    BP 134/60 (BP Location: Right Arm, Patient Position: Sitting, Cuff Size: Normal)   Pulse 76   Resp 14   Wt 117 lb (53.1 kg)   LMP  02/16/1988 (Approximate)   BMI 21.40 kg/m     General appearance: alert, cooperative and appears stated age  Pelvic: External genitalia:  no lesions              Urethra:  normal appearing urethra with no masses, tenderness or lesions              Bartholins and Skenes: normal                 Vagina: normal appearing vagina with normal color and discharge, very mild superficial ulceration beneath posterior aspect of cervix, estrogen applied once pessary was removed              Cervix: no lesions              Bimanual Exam:  Uterus:  normal size, contour, position, consistency, mobility, non-tender              Adnexa: no mass, fullness, tenderness   Pessary cleansed and replaced without difficulty  Chaperone was present for exam.  Assessment: Cystocele Vaginal, superficial, ulceration  Plan: Return one month.  Does not need estrogen refill at this time.  Knows to call sooner if having any new symptoms.   ~15 minutes spent with patient >50% of time was in face to face discussion of above.

## 2017-05-10 ENCOUNTER — Other Ambulatory Visit: Payer: Self-pay | Admitting: Pulmonary Disease

## 2017-05-23 ENCOUNTER — Ambulatory Visit: Payer: Medicare Other | Admitting: Obstetrics & Gynecology

## 2017-05-24 ENCOUNTER — Ambulatory Visit: Payer: Medicare Other | Admitting: Obstetrics & Gynecology

## 2017-05-24 ENCOUNTER — Encounter: Payer: Self-pay | Admitting: Obstetrics & Gynecology

## 2017-05-24 ENCOUNTER — Other Ambulatory Visit: Payer: Self-pay

## 2017-05-24 VITALS — BP 132/56 | HR 76 | Resp 16 | Ht 62.0 in | Wt 119.0 lb

## 2017-05-24 DIAGNOSIS — N8111 Cystocele, midline: Secondary | ICD-10-CM

## 2017-05-24 NOTE — Progress Notes (Signed)
GYNECOLOGY  VISIT  CC:   Pessary check, cleaning  HPI: 77 y.o. [id number] Widowed Caucasian female here for pessary follow up.  Denies vaginal discharge.  No vaginal bleeding.  Having no pelvic pain.  GYNECOLOGIC HISTORY: Patient's last menstrual period was 02/16/1988 (approximate). Contraception: post menopausal Menopausal hormone therapy: estradiol vag cream   Patient Active Problem List   Diagnosis Date Noted  . Intermittent palpitations 01/17/2017  . Right shoulder pain 12/14/2016  . Vertigo 04/02/2016  . Right carotid bruit 08/01/2015  . Uterine prolapse 06/13/2013  . Cystocele 06/13/2013  . 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 ESOPHAGEAL REFLUX 03/24/2010  . 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 HYPERCHOLESTEROLEMIA, BORDERLINE, WITH HIGH HDL 01/18/2008  . Disorder of bone and cartilage 01/18/2008  . COLONIC POLYPS 01/17/2008  . Hypothyroidism 01/17/2008  . Anxiety state 01/17/2008  . History of cardiovascular disorder 01/17/2008    Past Medical History:  Diagnosis Date  . COPD (chronic obstructive pulmonary disease) ([place])   . Hx of colonic polyps   . Hypercholesterolemia   . Hypothyroidism   . 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 MVP (mitral valve prolapse)   . Osteopenia   . Prolapsed uterus   . Varicose vein     Past Surgical History:  Procedure Laterality Date  . 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 Patient does not remember exact date of surgery.  . [place]  . MELANOMA EXCISION  12/31/2010   Procedure: MELANOMA EXCISION;  Surgeon: Zenovia Jarred, MD;  Location: [facility];  Service: General;  Laterality: Left;  wide excision melanoma left thigh, excision lesion left thigh  . OTHER SURGICAL HISTORY Right    Surgical repair of architectual distortion Right Breast- Hyperplasia  . TONSILLECTOMY      MEDS:   Current Outpatient Medications on File Prior to Visit  Medication Sig Dispense Refill  . acyclovir  ointment (ZOVIRAX) 5 % See admin instructions. Prn cold sores  0  . aspirin 81 MG tablet Take 81 mg by mouth daily.      . AZO-CRANBERRY PO Take by mouth daily.    . Calcium Carbonate-Vitamin D (CALCIUM + D PO) Take by mouth daily.      . Cholecalciferol (VITAMIN D) 2000 UNITS CAPS Take 1 capsule by mouth daily.    . cimetidine (TAGAMET) 400 MG tablet TAKE 1 TABLET BY MOUTH TWICE A DAY (Patient taking differently: prn) 60 tablet 0  . fluticasone (FLONASE) 50 MCG/ACT nasal spray PLACE 2 SPRAYS INTO BOTH NOSTRILS AS NEEDED. 16 g 5  . fluticasone (FLONASE) 50 MCG/ACT nasal spray PLACE 2 SPRAYS INTO BOTH NOSTRILS AS NEEDED. 16 g 2  . levothyroxine (SYNTHROID, LEVOTHROID) 88 MCG tablet Take 1 tablet (88 mcg total) by mouth daily before breakfast. 30 tablet 11  . LUTEIN PO Take 1 tablet by mouth daily.    . Multiple Vitamin (MULTIVITAMIN) tablet Take 1 tablet by mouth daily.      . nicotine polacrilex (NICORETTE) 2 MG gum Take 2 mg by mouth as needed. Not smoking    . NONFORMULARY OR COMPOUNDED ITEM Vaginal estradiol cream 0.02%.  Place 1/2 gram per vagina at bedtime every other night for 10 days, then place 1/2 gram per vaginal at bedtime twice weekly. 8 each 10   No current facility-administered medications on file prior to visit.     ALLERGIES: Codeine; Ciprofloxacin; and Sulfur  Family History  Problem Relation Age of Onset  .  Hypertension Mother   . Congestive Heart Failure Mother   . Thyroid disease Mother   . Stroke Father   . Heart attack Father 74  . Diabetes Paternal Grandmother   . Diabetes Paternal Grandfather   . Hypertension Maternal Grandfather   . CVA Maternal Grandfather   . Esophageal cancer Neg Hx   . Rectal cancer Neg Hx   . Stomach cancer Neg Hx   . Colon cancer Neg Hx     SH:  Widowed, non somker  Review of Systems  All other systems reviewed and are negative.   PHYSICAL EXAMINATION:    BP (!) 132/56 (BP Location: Left Arm, Patient Position: Sitting, Cuff  Size: Normal)   Pulse 76   Resp 16   Ht 5\' 2"  (1.575 m)   Wt 119 lb (54 kg)   LMP 02/16/1988 (Approximate)   BMI 21.77 kg/m     General appearance: alert, cooperative and appears stated age Inguinal:  No LAD  Pelvic: External genitalia:  no lesions              Urethra:  normal appearing urethra with no masses, tenderness or lesions              Bartholins and Skenes: normal      Pessary removed and cleaned.                Vagina: normal appearing vagina with normal color and discharge, very mild pressure ulceration at apex, no bleeding.  Estrogen placed at apex and pessary replaced after exam completed              Cervix: no lesions              Bimanual Exam:  Uterus:  normal size, contour, position, consistency, mobility, non-tender              Adnexa: no mass, fullness, tenderness  Chaperone was present for exam.  Assessment: Cystocele Pessary use  Plan: Recheck 1 month.  Does not need estrogen rx at this time.

## 2017-06-14 ENCOUNTER — Other Ambulatory Visit (INDEPENDENT_AMBULATORY_CARE_PROVIDER_SITE_OTHER): Payer: Medicare Other

## 2017-06-14 ENCOUNTER — Encounter: Payer: Self-pay | Admitting: Pulmonary Disease

## 2017-06-14 ENCOUNTER — Ambulatory Visit: Payer: Medicare Other | Admitting: Pulmonary Disease

## 2017-06-14 ENCOUNTER — Ambulatory Visit (INDEPENDENT_AMBULATORY_CARE_PROVIDER_SITE_OTHER): Payer: Medicare Other

## 2017-06-14 ENCOUNTER — Ambulatory Visit (INDEPENDENT_AMBULATORY_CARE_PROVIDER_SITE_OTHER)
Admission: RE | Admit: 2017-06-14 | Discharge: 2017-06-14 | Disposition: A | Discharge status: Home / Self Care | Payer: Medicare Other | Source: Ambulatory Visit | Attending: Pulmonary Disease | Admitting: Pulmonary Disease

## 2017-06-14 VITALS — BP 134/66 | HR 66 | Temp 97.6°F | Ht 62.0 in | Wt 117.6 lb

## 2017-06-14 DIAGNOSIS — E7800 Pure hypercholesterolemia, unspecified: Secondary | ICD-10-CM

## 2017-06-14 DIAGNOSIS — F411 Generalized anxiety disorder: Secondary | ICD-10-CM | POA: Diagnosis not present

## 2017-06-14 DIAGNOSIS — J4521 Mild intermittent asthma with (acute) exacerbation: Secondary | ICD-10-CM

## 2017-06-14 DIAGNOSIS — M949 Disorder of cartilage, unspecified: Principal | ICD-10-CM

## 2017-06-14 DIAGNOSIS — C4372 Malignant melanoma of left lower limb, including hip: Secondary | ICD-10-CM

## 2017-06-14 DIAGNOSIS — E039 Hypothyroidism, unspecified: Secondary | ICD-10-CM

## 2017-06-14 DIAGNOSIS — Z87891 Personal history of nicotine dependence: Secondary | ICD-10-CM

## 2017-06-14 DIAGNOSIS — K219 Gastro-esophageal reflux disease without esophagitis: Secondary | ICD-10-CM

## 2017-06-14 DIAGNOSIS — M899 Disorder of bone, unspecified: Secondary | ICD-10-CM

## 2017-06-14 DIAGNOSIS — N814 Uterovaginal prolapse, unspecified: Secondary | ICD-10-CM

## 2017-06-14 DIAGNOSIS — D126 Benign neoplasm of colon, unspecified: Secondary | ICD-10-CM

## 2017-06-14 DIAGNOSIS — R002 Palpitations: Secondary | ICD-10-CM

## 2017-06-14 LAB — COMPREHENSIVE METABOLIC PANEL
ALBUMIN: 4.3 g/dL (ref 3.5–5.2)
ALT: 14 U/L (ref 0–35)
AST: 19 U/L (ref 0–37)
Alkaline Phosphatase: 55 U/L (ref 39–117)
BILIRUBIN TOTAL: 0.4 mg/dL (ref 0.2–1.2)
BUN: 14 mg/dL (ref 6–23)
CALCIUM: 9.7 mg/dL (ref 8.4–10.5)
CHLORIDE: 102 meq/L (ref 96–112)
CO2: 27 mEq/L (ref 19–32)
CREATININE: 0.68 mg/dL (ref 0.40–1.20)
GFR: 89.24 mL/min (ref >60.00)
Glucose, Bld: 94 mg/dL (ref 70–99)
Potassium: 4.2 mEq/L (ref 3.5–5.1)
Sodium: 140 mEq/L (ref 135–145)
Total Protein: 7.4 g/dL (ref 6.0–8.3)

## 2017-06-14 LAB — CBC WITH DIFFERENTIAL/PLATELET
BASOS PCT: 0.7 % (ref 0.0–3.0)
Basophils Absolute: 0.1 10*3/uL (ref 0.0–0.1)
EOS PCT: 0.8 % (ref 0.0–5.0)
Eosinophils Absolute: 0.1 10*3/uL (ref 0.0–0.7)
HCT: 36.6 % (ref 36.0–46.0)
HEMOGLOBIN: 12.3 g/dL (ref 12.0–15.0)
Lymphocytes Relative: 54.6 % — ABNORMAL HIGH (ref 12.0–46.0)
Lymphs Abs: 4.4 10*3/uL — ABNORMAL HIGH (ref 0.7–4.0)
MCHC: 33.5 g/dL (ref 30.0–36.0)
MCV: 95 fl (ref 78.0–100.0)
MONO ABS: 0.8 10*3/uL (ref 0.1–1.0)
Monocytes Relative: 9.4 % (ref 3.0–12.0)
Neutro Abs: 2.8 10*3/uL (ref 1.4–7.7)
Neutrophils Relative %: 34.5 % — ABNORMAL LOW (ref 43.0–77.0)
Platelets: 188 10*3/uL (ref 150.0–400.0)
RBC: 3.85 Mil/uL — ABNORMAL LOW (ref 3.87–5.11)
RDW: 15.8 % — AB (ref 11.5–15.5)
WBC: 8 10*3/uL (ref 4.0–10.5)

## 2017-06-14 LAB — LIPID PANEL
Cholesterol: 199 mg/dL (ref 0–200)
HDL: 78.3 mg/dL (ref >39.00)
LDL CALC: 104 mg/dL — AB (ref 0–99)
NONHDL: 120.82
Total CHOL/HDL Ratio: 3
Triglycerides: 83 mg/dL (ref 0.0–149.0)
VLDL: 16.6 mg/dL (ref 0.0–40.0)

## 2017-06-14 LAB — TSH: TSH: 3.17 u[IU]/mL (ref 0.35–4.50)

## 2017-06-14 MED ORDER — METHYLPREDNISOLONE ACETATE 80 MG/ML IJ SUSP
80.0000 mg | Freq: Once | INTRAMUSCULAR | Status: AC
  Administered 2017-06-14: 80 mg via INTRAMUSCULAR

## 2017-06-14 MED ORDER — METHYLPREDNISOLONE 4 MG PO TBPK: ORAL_TABLET | ORAL | 0 refills | Status: DC

## 2017-06-14 MED ORDER — LEVOFLOXACIN 500 MG PO TABS: 500.0000 mg | ORAL_TABLET | Freq: Every day | ORAL | 0 refills | Status: DC

## 2017-06-14 NOTE — Progress Notes (Signed)
Subjective:     Patient ID: Ariel Holmes, female   DOB: 03-06-1940, 77 y.o.   MRN: [id number]  HPI    77 y/o WF here for a follow up visit... she has multiple medical problems as noted below...   ~  SEE PREV EPIC NOTES FOR OLDER DATA >>    CXR 3/13 showed heart is wnl, lungs clear x sl incr markings, NAD.[name]  LABS 3/13:  FLP- Improved on diet;  Chems- wnl;  CBC- wnl;  TSH=0.71 on Levothy100/d...  LABS 11/13:  Chems- wnl;  CBC- wnl...  CXR 3/14 showed normal heart size, calcif & ectasia of Ao, clear lungs, DJD/ sl scoliosis of spine/ osteopenia...  LABS 3/14:  FLP- not at goals on diet alone but hi HDL;  TSH=0.42;  VitD=60   CXR 3/15 showed norm heart size, clear lungs, mild DJD spine, no lesions/ NAD...  PFT 3/15 showed FVC=2.00 (76%), FEV1=1.56 (77%), %1sec=78%, mid-flows=75% predicted... Interpret: No signif obstruction, can't r/o mild restriction w/o lung vol measurement- SMN  BMD 3/15 showed TScore -0.5 in Spine and -1.4 in [facility]; Rec to continue Calcium, MVI, VitD & exercise...  ~  June 11, 2014:  37mo ROV & [name] has had a rough time since [name] passed away 29-Mar-2014; she works Scientist, research (physical sciences) for [employer] & that has helped keeping her busy,,, she is c/o some nasal congestion, drainage, & blowing beige mucus; draining to her chest but denies hemooptysis, SOB, CP, f/c/s; she is fatigued and achey- using Flonase and Saline... She quit smoking 4 yrs ago & still chews nicorette gum- we discussed weaning this down already! We reviewed the following medical problems during today's office visit >>     AR, AB, ex-smoker> on OTC Antihist, Flonase, & Nicorette; she quit smoking 6/12 but still using Nicotine replacement & we discussed weaning this "medication"...    MVP> on ASA81; she denies CP, palpit, SOB, edema, etc; she is active & doing satis...    Ven Insuffic> she knows to avoid sodium, elev legs, wear support hose...    Chol> on diet alone esp w/ her good HDL; Labs 4/16  showed TChol 196, TG 93, HDL 76, LDL 101    Hypothy> on Synthroid100; Labs 4/16 showed TSH= 0.89; she remains clinically & biochem euthyroid...    GI- GERD, Polyps, Hems> stable w/o abd pain, dysphagia, n/v, c/d, blood seen; last colon 5/15 by DrDBrodie showed divertics in sigmoid, melanosis, no recurrent polyps (she rec f/u 10 yrs).    Osteopenia> on Calcium, MVI, VitD2000; she stopped prev Fosamax Rx yrs ago- last BMD 2009 w/ Tscore -1.5 in [facility]...    Anxiety> husb [name] passed away 03-29-2014 after a long illness, she is doing satis & declines anxiolytic meds...    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 We reviewed prob list, meds, xrays and labs> see below for updates >>   CXR 05/2014 showed norm heart size, clear lungs, NAD...   LABS 4/16:  FLP- at goals on diet;  TSH=.89;  Prev Chems/ CBC 01/2014 were wnl...   ~  June 12, 2015:  5yr ROV & Lakyla has had some interval GYN problems w/ prolapsed uterus "my insides fell out" & currently managed w/ a pessary- changed every 86mo & she is holding off on surg consideration (she did see Urology for PT, exercises);  She quit smoking 5 yrs ago & I note that she is  still using nicorette gum daily!  She notes that her breathing is OK, no cough, sputum, hemoptysis, and denies SOB, states she's walking 3-4 mi per day w/ friends & denies problems;  She had some neck/ shoulder pain & managed this w/ massage therapy & accupuncture/ "cupping" which really helped she says...    AR, AB, ex-smoker> on OTC Antihist, Flonase, & Nicorette; she quit smoking 6/12 but still using Nicotine replacement & we discussed weaning this "medication"; breathing is good, exercising daily.    MVP> on ASA81; she denies CP, palpit, SOB, edema, etc; she is active & doing satis...    Ven Insuffic> she knows to avoid sodium, elev legs, wear support hose...    Chol> on diet alone esp w/  her good HDL; Labs 4/17 showed TChol 200, TG 74, HDL 87, LDL 98    Hypothy> on Synthroid100; Labs 4/17 showed TSH= 0.28; she remains clinically stable but wt is down to 116# so we decided to decr the Synthroid to 89mcg/d.    GI- GERD, Polyps, Hems> stable w/o abd pain, dysphagia, n/v, c/d, blood seen; last colon 5/15 by DrDBrodie showed divertics in sigmoid, melanosis, no recurrent polyps (she rec f/u 10 yrs).    GYN> followed by DrSilva w/ uterine proalpse, she has a pessary (cleaned & inspected Q38mo) and she as seen Urology for PT & kaegel exercises...    Osteopenia> on Calcium, MVI, VitD2000; she stopped prev Fosamax Rx yrs ago- last BMD 2009 w/ Tscore -1.5 in [facility]...    Anxiety> husb [name] passed away 03-14-14 after a long illness, she is doing satis & declines anxiolytic meds...    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 EXAM shows Afeb, VSS, O2sat=97% on RA;  HEENT- neg, mallampati1;  Chest- clear w/o w/r/r;  Heart- RR w/o m/r/g;  Abd- soft, nontender, neg;  Ext- w/o c/c/e;  Neuro- intact...  CXR 06/12/15>  Norm heart size,clear lungs w/ sl incr markings, NAD, DDD in Tspine...  LABS 01/2015 by DrEnnever>  Chems- wnl;  CBC- ok w/ Hg=12.1;  Vit D=61...  LABS 06/12/15>  FLP- all parameters at goals on diet alone;  TSH= 0.28 on Synthroid100 IMP/PLAN>>  Jazzmin is stable overall, has quit smoking 5 yrs ago but still using nicorette daily- again asked to wean off;  Her TSH is sl oversuppressed on Synthroid100 & wt is down to 116# so we will decr dose to 65mcg/d & plan recheck TFTs;  She was given PREVNAR-13 today;  We plan ROV in 68yr, sooner if needed prn...  ~  August 01, 2015:  6wk ROV & Beyonce tells me that she went to [place]/ [facility] in May & returned 5/23 c/o "feeling yukey", low grade temp 99+, cough/ sinus drainage/ blowing yellow blood-streaked sput/ sore throat;  [facility] called in for pt & she  improved some but "this sounds crazy" but every afternoon the symptoms felt like they were coming back; we recommended a Medrol dosepak but pt refused this med- took OTC Mucinex, Flonase, Tylenol and called several times just still feeling bad, no energy, etc=> we brought her in for recheck...  We reviewed the problem list above.    Recall that last OV her TSH=0.28 on Synthroid100 & we cut her back to 44mcg/d but she has felt worse on this dose=> we will recheck labs today... EXAM shows Afeb, VSS, O2sat=100% on RA;  HEENT- neg, mallampati1;  Neck- soft right carotid  bruit;  Chest- clear w/o w/r/r;  Heart- RR w/o m/r/g;  Abd- soft, nontender, neg;  Ext- w/o c/c/e;  Neuro- intact...  Labs 08/01/15>  Chems- wnl;  CBC- essent wnl;  TSH=2.81 on the Synthroid75- continue same;  Sed=8 therefore no signif inflamm...  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 IMP/PLAN>>  Jan appears to have a post-viral syndrome & in the last few days she started feeling sl better, we discussed Depo80/ rest/ fluids/ continued OTC rx w/ Mucinex, Flonase, Tylenol, etc;  Her exam is neg x for a soft right CBruit heard today=> check CDoppler;  She had ?prob (feeling worse) within a few days of decreasing her Synthroid from 167mcg to 57mcg in April & we rechecked labs today...  ~  December 01, 2015:  45mo ROV & add-on appt requested for URI>  Renatta tells me she's been working the [employer] exposed to folks from all over; notes 3d hx URI w/ cough, min clear sput, no discoloration or blood, feeling tired, "yucky", aching/ sore, ?swollen glands, wants to sleep; denies CP, SOB, wheezing, etc; notes temp 98.8 today & says "thats a fever for me"... We reviewed her problem list & meds- as above, stable, except she's seen her GYN- DrSilva x6 since Jun2017 w/ UTI x2, uterine prolapse w/ pessary as she declines surg options...      EXAM shows Afeb, VSS, O2sat=99% on RA;  HEENT- neg x sl red, mallampati1;  Chest- clear w/o w/r/r;  Heart- RR w/o m/r/g;  Abd- soft, nontender, neg;  Ext- w/o c/c/e;  Neuro- intact... IMP/PLAN>>  Addisynn appears to have an upper resp infection, suspect viral but she'd apprec Rx w/ Depo & ZPak- done per request 7 rec to rest w/ fluids, Tylenol, etc...  ~  June 14, 2016:  40mo ROV & Jaquasia is c/o some muscle spasm in her back & neck- takes aleve prn & gets accupuncturew/ cupping at [facility]" here in [place] "It sure helps" she says;  Asking about the new shingles vaccine & we discussed the rec for 2 doses of SHINGRIX, even for those who had the Zostavax in the past, Rx written, asked to check w/ her insurance first, 2nd dose is ~39mo from the 1st...  She shared that her 43 y/o son in [place] was Dx w/ Parkinson's dis & doing boxing therapy... We reviewed the following medical problems during today's office visit >>     AR, AB, ex-smoker> on OTC Antihist, Flonase, & Nicorette; she quit smoking 6/12 but still using Nicotine daily & we discussed weaning this "medication"; breathing is good- no cough/ sputum/ SOB, exercising daily.    MVP> on ASA81; she denies CP, palpit, SOB, edema, etc; she is active & doing satis...    Ven Insuffic> she knows to avoid sodium, elev legs, wear support hose...    Chol> on diet alone esp w/ her good HDL; Labs 4/18 showed TChol 201, TG 66, HDL 91, LDL 98    Hypothy> on Synthroid88; Labs 4/18 showed TSH= 1.63; she remains clinically stable but wt is stable at 116# & rec to continue same med...    GI- GERD, Polyps, Hems> on Tagamet400 prn; stable w/o abd pain, dysphagia, n/v, c/d, blood seen; last colon 5/15 by DrDBrodie showed divertics in sigmoid, melanosis, no recurrent polyps (she rec f/u 10 yrs).    GYN> followed by DrSilva w/ uterine proalpse, she  has a pessary (cleaned & inspected Q1-77mo) and she as seen Urology for PT & kaegel exercises...    Osteopenia> on Calcium, MVI,  VitD2000; she stopped prev Fosamax Rx yrs ago- BMD f/u 05/2016 showed Tscores -1.0 in Lspine & -1.6 in left Femur; Rec to take Calcium, womensMVI, VitD~2000u daily & wt bearing exercise.               Anxiety> husb [name] passed away 03-24-2014 after a long illness, she is doing satis & declines anxiolytic meds...    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 EXAM shows Afeb, VSS, O2sat=99% on RA;  HEENT- neg x sl red, mallampati1;  Chest- clear w/o w/r/r;  Heart- RR w/o m/r/g;  Abd- soft, nontender, neg;  Ext- w/o c/c/e;  Neuro- intact...  CXR 06/14/16 (independently reviewed by me in the PACS system) showed norm heart size, mild atherosclerosis of Ao, sl incr markings, otherw clear- NAD...   LABS 06/14/16>  FLP- at goals on diet alone;  Chems- wnl;  CBC- wnl x wbc=12.9;  TSH=1.63 on 20mcg/d...  Bone Density Test  4.30/18> showed Tscores -1.0 in Lspine & -1.6 in left Femur... Rec to take Calcium, womensMVI, VitD~2000u daily & wt bearing exercise. IMP/PLAN>  Albertha is stable w/ mult medcal issues as above-  rec to continue same meds, exercise, wean the nicorette, & call for any problems...    NOTE:  >50% of this 30 min ROV was spent in counseling 7 coordination of care...  ~  December 14, 2016:  87mo ROV & Tishie describes a recent tri[p to [place]- she did well medically & had a blast... She tripped at home 7/1, hit right side of head w/ black eye, went to ER & they used superglue;  She reports feeling OK- notes some right shoulder pain & sh'e asking about PT but I mentuioned Ortho eval 1st...  We reviewed the following medical problems during today's office visit >>     AR, AB, ex-smoker> on OTC Antihist, Flonase, & Nicorette; she quit smoking 6/12 but still using Nicotine daily! & we discussed weaning this "medication"; breathing is good- no cough/ sputum/ SOB, exercising daily.    MVP> on ASA81; she denies  CP, palpit, SOB, edema, etc; she is active & doing satis; as noted she still chews Nicorette gum daily, and she drinks 4+ caffeine beverages daily as well- advised to wean off both!    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    VV & Ven Insuffic> she knows to avoid sodium, elev legs, wear support hose; she is an avid walker...    Chol> on diet alone esp w/ her good HDL; Labs 4/18 showed TChol 201, TG 66, HDL 91, LDL 98    Hypothy> on Synthroid88; Labs 4/18 showed TSH= 1.63; she remains clinically stable but wt is stable at 115# & rec to continue same med...    GI- GERD, Polyps, Hems> on Tagamet400 prn; stable w/o abd pain, dysphagia, n/v, c/d, blood seen; last colon 5/15 by DrDBrodie showed divertics in sigmoid, melanosis, no recurrent polyps (she rec f/u 10 yrs).    GYN> followed by DrSilva w/ uterine proalpse, she has a pessary (cleaned & inspected Q1-51mo) and she as seen Urology for PT & kaegel exercises...    DJD>  c/o right shoulder pain & she uses OTC aleve which helps, offered Ortho referral when she is ready...    Osteopenia> on Calcium, MVI, VitD2000; she stopped prev Fosamax Rx yrs ago- BMD f/u 05/2016 showed Tscores -1.0 in Lspine & -1.4 in left Femur; Rec to take Calcium, womensMVI, VitD~2000u daily & wt bearing exercise.              Anxiety> husb [name] passed away 03/13/2014 after a long illness, she is doing satis & declines anxiolytic meds...    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 EXAM shows Afeb, VSS, O2sat=98% on RA;  HEENT- neg x sl red, mallampati1;  Chest- clear w/o w/r/r;  Heart- RR w/o m/r/g;  Abd- soft, nontender, neg;  Ext- w/o c/c/e;  Neuro- intact... IMP/PLAN>>  Derrica is stable overall w/ mult medical issues above; she is asked to  decr Nicorette & caffeine, stay as active as poss, consider Ortho eval of right shoulder pain;  She's had the 2018 FLU vaccine;  We plan ROV recheck in 9m, sooner if needed prn...  ~  January 17, 2017:  36mo ROV & add-on appt requested by pt & GYN for a cardiac irregularity detected at [facility] office 12/31/16;  Pt says she notes brief palpit ~once per month- on 12/31/16 she was at GYN office for her routine pessary change & DrSilva noted irreg heartbeat=> they suggested pt f/u w/ [place];  Pt drinks 4 cups of coffee/d, still chews nicorette gum years after quitting smoking (doen't know how much but it's a lot & every day), she'd had routine mammogram that day, did some [employer] shopping, then the pessary change, says she hadn't eaten yet;  In GYN office she felt jittery "I'm a hyper person" but denies noting skipping/ racing/ fluttering; GYN did not do EKG... Pt has Hx mild MVP based on 2DEcho from yrs ago (not in epic)... After the phone call 12/31/16 we set up 2DEcho=> done 01/04/17 see below...    SEE PROB LIST above- reviewed...  EXAM shows Afeb, VSS, O2sat=97% on RA;  HEENT- neg x sl red, mallampati1;  Chest- clear w/o w/r/r;  Heart- RR w/o m/r/g;  Abd- soft, nontender, neg;  Ext- w/o c/c/e;  Neuro- intact...  EKG 01/17/17 shows SBrady, rate53, WNL, NAD...   2DEcho done 01/04/17 showed norm LV size & function w/ EF=60-65% and no RWMA, Gr1DD, MV- mildly thickened leaflets, mild MR, mild LA dil at 56mm, norm RV & PA... IMP/PLAN>>  We discussed her findings and offered further eval w/ Holter vs Event Recorder, or consider a conservative approach by decr caffeine, decr nicotine gum, and careful observation approach;  She prefers the latter & she is once again admonished to decr her caffeine (switch to decaf), and decr the nicorette gum!  NOTE:  >50% of this 87min rov was spent in counseling & coordination of care...    ~  June 14, 2017:  33mo ROV & general medical follow up visit>     CXR 06/14/17  (independently reviewed by me in the PACS system) shows norm heart size, clear lungs- NAD...   LABS 06/14/17>  FLP- at goals on diet alone;  Chems- ok w/ BS=94, Cr=0.68, LFTs wnl;  CBC- ok w/ Hg=12.3;  TSH=3.17 on Levothy88;  VitD- 52 (18-72)            PROBLEM LIST:    ALLERGIC RHINITIS - uses FLONASE Prn...  ASTHMATIC  BRONCHITIS, ACUTE (ICD-466.0) - no recent bronchitic exac... not on regular meds. ~  CXR 12/09 showed hyperinflation & incr markings, spondylosis in TSpine... 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 ~  CXR 3/13 showed heart size is wnl, lungs clear x sl incr markings, NAD. ~  CXR 3/14 showed normal heart size, calcif & ectasia of Ao, clear lungs, DJD/ sl scoliosis of spine/ osteopenia. ~  CXR 3/15 showed norm heart size, clear lungs, mild DJD spine, no lesions/ NAD. ~  PTF 3/15 showed FVC=2.00 (76%), FEV1=1.56 (77%), %1sec=78%, mid-flows=75% predicted... Interpret: No signif obstruction, can't r/o mild restriction w/o lung vol measurement ~  CXR 4/16 showed norm heart size, clear lungs, NAD. 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 ~  Still using nicorette gum daily 7 asked to wean down...  MITRAL VALVE PROLAPSE, HX OF (ICD-V12.50) - on ASA 81mg /d... hx mild MVP on 2DEcho in 1995 (no signif MR)... asymptomatic without CP or palpit... 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 she knows to avoid sodium, elevate legs, wear support hose, etc...  HYPERCHOLESTEROLEMIA, BORDERLINE, WITH HIGH HDL (ICD-272.0) ~  FLP 8/08 showed TChol 211, TG 47, HDL 97, LDL 101 ~  FLP 12/09 showed TChol 190, TG 65, HDL 81, LDL 96 ~  FLP 1/11 showed TChol 206, TG 52, HDL 89, LDL 105 ~  FLP 1/12 showed TChol 221, TG 62, HDL  89, LDL 117 ~  FLP 3/13 on diet alone showed TChol 205, TG 77, HDL 86, LDL 95... Keep up the good work. ~  [place] 3/14 on diet alone showed TChol 228, TG 92, HDL 88, LDL 109 ~  FLP 3/15 on diet alone showed TChol 209, TG 78, HDL 83, LDL 105  ~  FLP 4/16 on diet alone showed TChol 196, TG 93, HDL 76, LDL 101  ~  FLP 4/17 on diet alone showed TChol 200, TG 74, HDL 87, LDL 98  HYPOTHYROIDISM (ICD-244.9) - on LEVOTHYROID 128mcg/d... ~  labs 12/09 showed TSH= 0.44 ~  labs 1/11 showed TSH= 0.66 ~  labs 1/12 showed TSH= 0.85 ~  Labs 3/13 on Levo100 showed TSH= 0.71 ~  Labs 3/14 on Levo100 showed TSH=0.42 ~  Labs 3/15 on Levo100 showed TSH= 0.43 ~  Labs 4/16 on Levo100 showing TSH= 0.89 ~  Labs 4/17 on Levo100 showed TSH=0.28, her wt is down to 116# & we decided to decr the Synthroid dose to 34mcg/d...  ESOPHAGEAL REFLUX (ICD-530.81) - she uses Cimetadine 300mg  Prn for "Stomach" & requests refill from [place]...  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 ~  colonoscopy 4/00 by DrDBrodie showed several 6-87mm polyps= adenomatous... ~  colonoscopy 4/05 by DrDBrodie showed only melanosis and hems... ~  colonoscopy 5/10 DrDBrodie showed mild divertics, 2 cecal polyps- tubular adenoma, f/u 7yrs. ~  colonoscopy 5/15 DrDBrodie showed divertics in sigmoid, melanosis, no recurrent polyps (she rec f/u 10 yrs).  OSTEOPENIA (ICD-733.90) - on calcium, MVI, Vit D 2000 u daily... she stopped her prev Fosamax rx on her own in 2011. ~  BMD here 8/07 showed TScores -1.2 in Spine, and -1.8 in left FemNeck. ~  labs 8/08 showed Vit D level=  38 ~  BMD here 9/09 showed TScores -0.8 in Spine, and -1.5 in left FemNeck. ~  Labs 1/11 showed Vit D level = 51 ~  Labs 3/14 showed Vit D level = 60 ~  Labs 3/15 showed Vit D level = 79 ~  BMD 3/15 showed Tscore -1.4 & rec to continue MVI, VitD, wt bearing exercises.  ANXIETY (ICD-300.00) - not currently on meds.  MELANOMA >> Diagnosed w/  Lentigo Maligna Melanoma on inner left thigh 11/12 by Brunetta Jeans;  Wider excision w/ neg path 11/12 by DrThompson;  Oncology review by DrEnnever & no plans for adjuvant therapy... ~  She reports on-going follow up from [place] ?every 3-52mo? ~  11/13: she saw DrEnnever for follow up> on observation, doing well w/o recurrence...  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 ~  She continues to f/u w/ DrEnnever yearly...  Health Maintenance: ~  neg [facility] at [facility]- followed yearly... due for GYN check by [facility] & she will call. ~  Immunizations:  she gets the yearly seasonal Flu vaccines... she received Shingles vaccine 2/09 at [facility]... had PNEUMOVAX in 2002 & 1/12 (age 40)... given TDAP 1/12.   Past Surgical History:  Procedure Laterality Date  . 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 Patient does not remember exact date of surgery.  . [place]  . MELANOMA EXCISION  12/31/2010   Procedure: MELANOMA EXCISION;  Surgeon: Zenovia Jarred, MD;  Location: [facility];  Service: General;  Laterality: Left;  wide excision melanoma left thigh, excision lesion left thigh  . OTHER SURGICAL HISTORY Right    Surgical repair of architectual distortion Right Breast- Hyperplasia  . TONSILLECTOMY      Outpatient Encounter Medications as of 06/14/2017  Medication Sig  . acyclovir ointment (ZOVIRAX) 5 % See admin instructions. Prn cold sores  . aspirin 81 MG tablet Take 81 mg by mouth daily.    . AZO-CRANBERRY PO Take by mouth daily.  . Calcium Carbonate-Vitamin D (CALCIUM + D PO) Take by mouth daily.    . Cholecalciferol (VITAMIN D) 2000 UNITS CAPS Take 1 capsule by mouth daily.  . cimetidine (TAGAMET) 400 MG tablet TAKE 1 TABLET BY MOUTH TWICE A DAY (Patient taking  differently: prn)  . fluticasone (FLONASE) 50 MCG/ACT nasal spray PLACE 2 SPRAYS INTO BOTH NOSTRILS AS NEEDED.  [name] levothyroxine (SYNTHROID, LEVOTHROID) 88 MCG tablet Take 1 tablet (88 mcg total) by mouth daily before breakfast.  . LUTEIN PO Take 1 tablet by mouth daily.  . Multiple Vitamin (MULTIVITAMIN) tablet Take 1 tablet by mouth daily.    . nicotine polacrilex (NICORETTE) 2 MG gum Take 2 mg by mouth as needed. Not smoking  . NONFORMULARY OR COMPOUNDED ITEM Vaginal estradiol cream 0.02%.  Place 1/2 gram per vagina at bedtime every other night for 10 days, then place 1/2 gram per vaginal at bedtime twice weekly.  [name] levofloxacin (LEVAQUIN) 500 MG tablet Take 1 tablet (500 mg total) by mouth daily.  . methylPREDNISolone (MEDROL DOSEPAK) 4 MG TBPK tablet 6 day pak-take as directed   No facility-administered encounter medications on file as of 06/14/2017.     Allergies  Allergen Reactions  . Codeine Nausea And Vomiting  . Ciprofloxacin     Musculskeletal pain.  . Sulfur  Nausea Only    Immunization History  Administered Date(s) Administered  . H1N1 01/18/2008  . Influenza Split 11/16/2010, 11/29/2011  . Influenza Whole 10/28/2009  . Influenza, High Dose Seasonal PF 12/16/2015, 12/03/2016  . Influenza,inj,Quad PF,6+ Mos 11/14/2012, 11/28/2013, 11/29/2014  . Influenza-Unspecified 12/04/2014  . Pneumococcal Conjugate-13 06/12/2015  . Pneumococcal Polysaccharide-23 03/11/2010  . Td 03/11/2010  . Tdap 08/14/2016  . Zoster 03/01/2007, 09/11/2016  . Zoster Recombinat (Shingrix) 06/14/2016    Current Medications, Allergies, Past Medical History, Past Surgical History, Family History, and Social History were reviewed in [employer] record.   Review of Systems         The patient prev complained of fatigue, dyspnea on exertion, cough, gas/bloating, incontinence, joint pain, arthritis, and anxiety.  The patient denies fever, chills, sweats, anorexia, weakness,  malaise, weight loss, sleep disorder, blurring, diplopia, eye irritation, eye discharge, vision loss, eye pain, photophobia, earache, ear discharge, tinnitus, decreased hearing, nasal congestion, nosebleeds, sore throat, hoarseness, chest pain, palpitations, syncope, orthopnea, PND, peripheral edema, dyspnea at rest, excessive sputum, hemoptysis, wheezing, pleurisy, nausea, vomiting, diarrhea, constipation, change in bowel habits, abdominal pain, melena, hematochezia, jaundice, indigestion/heartburn, dysphagia, odynophagia, dysuria, hematuria, urinary frequency, urinary hesitancy, nocturia, back pain, joint swelling, muscle cramps, muscle weakness, stiffness, sciatica, restless legs, leg pain at night, leg pain with exertion, rash, itching, dryness, suspicious lesions, paralysis, paresthesias, seizures, tremors, vertigo, transient blindness, frequent falls, frequent headaches, difficulty walking, depression, memory loss, confusion, cold intolerance, heat intolerance, polydipsia, polyphagia, polyuria, unusual weight change, abnormal bruising, bleeding, enlarged lymph nodes, urticaria, allergic rash, hay fever, and recurrent infections.     Objective:   Physical Exam     WD, WN, 77 y/o WF in NAD...  GENERAL:  Alert & oriented; pleasant & cooperative... HEENT:  [place]/AT, EOM-wnl, PERRLA, EACs-clear, TMs-wnl, NOSE-clear, THROAT-clear & wnl. NECK:  Supple w/ fairROM; no JVD; normal carotid impulses w/ faint right bruit; no thyromegaly or nodules palpated; no lymphadenopathy. CHEST:  Clear to P & A; without wheezes/ rales/ or rhonchi. HEART:  Regular Rhythm; without murmurs/ rubs/ or gallops. ABDOMEN:  Soft & nontender; normal bowel sounds; no organomegaly or masses detected. EXT: without deformities or arthritic changes; mild varicose veins/ venous insuffic/ tr edema. Scar on inner left thigh from melanoma surg, no palp adenopathy... NEURO:  CN's intact; motor testing normal; sensory testing normal; gait  normal & balance OK. DERM:  No lesions noted; no rash etc...  RADIOLOGY DATA:  Reviewed in the EPIC EMR & discussed w/ the patient...   LABORATORY DATA:  Reviewed in the EPIC EMR & discussed w/ the patient...    Assessment:      12/14/16>   Danika is stable overall w/ mult medical issues abouve; she is asked to decr Nicoreete & caffeine, stasy as active as poss, consider Ortho eval of right shoulder pain;  She's had the 2018 FLU vaccine;  We plan ROV recheck in 22m, sooner if needed prn 01/17/17>   We discussed her findings and offered further eval w/ Holter vs Event Recorder, or consider a conservative approach by decr caffeine, decr nicotine gum, and careful observation approach;  She prefers the latter & she is once again admonished to decr her caffeine (switch to decaf), and decr the nicorette gum!   Viral URI>  Similar to the below viral syndrome from [id number]- we will Rx w/ Depo80, ZPak, she declines dosepak ("I don't do well w/ pred")... Post-viral syndrome>  Returned from trip to [facility] 07/08/15 w/ URI, viral syndrome &  protracted symptoms- given ZPak, then Depo80 + OTC meds and slowly improving. AB, ex-smoker>  She quit smoking 6/12 on a bet w/ her uncle; & she has remain quit!  No resp exac & stable, no meds needed... ~  Asked to wean off the nicorette gum!  Hx MVP>  On ASA, she is asymptomatic w/o CP, palpit, etc... ~  CDoppler 07/2015 w/ mild plaque, no signif ICA stenoses...  CHOL>  FLP is much improved on diet & she will not need meds at this time...  Hypothyroid>  On Levothy88 w/ TSH= 1.63 in April2018, continue same...  GERD>  She uses OTC Tagamet as needed (her preference)...  Divertics, Colon Polyps>  She is up to date on colon screening w/ no polyps seen 5/15...  Osteopenia>  On Calcium, MVI, Vit D; she stopped her Fosamax on her own in 2011 & f/u BMD 05/2016 showed lowest Tscore -1.6 in left Femur- on Ca, MVI, VitD, wt bearing exercise.  Anxiety>  She does not want  anxiolytic therapy...  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     Plan:     Patient's Medications  New Prescriptions   LEVOFLOXACIN (LEVAQUIN) 500 MG TABLET    Take 1 tablet (500 mg total) by mouth daily.   METHYLPREDNISOLONE (MEDROL DOSEPAK) 4 MG TBPK TABLET    6 day pak-take as directed  Previous Medications   ACYCLOVIR OINTMENT (ZOVIRAX) 5 %    See admin instructions. Prn cold sores   ASPIRIN 81 MG TABLET    Take 81 mg by mouth daily.     AZO-CRANBERRY PO    Take by mouth daily.   CALCIUM CARBONATE-VITAMIN D (CALCIUM + D PO)    Take by mouth daily.     CHOLECALCIFEROL (VITAMIN D) 2000 UNITS CAPS    Take 1 capsule by mouth daily.   CIMETIDINE (TAGAMET) 400 MG TABLET    TAKE 1 TABLET BY MOUTH TWICE A DAY   FLUTICASONE (FLONASE) 50 MCG/ACT NASAL SPRAY    PLACE 2 SPRAYS INTO BOTH NOSTRILS AS NEEDED.   LEVOTHYROXINE (SYNTHROID, LEVOTHROID) 88 MCG TABLET    Take 1 tablet (88 mcg total) by mouth daily before breakfast.   LUTEIN PO    Take 1 tablet by mouth daily.   MULTIPLE VITAMIN (MULTIVITAMIN) TABLET    Take 1 tablet by mouth daily.     NICOTINE POLACRILEX (NICORETTE) 2 MG GUM    Take 2 mg by mouth as needed. Not smoking   NONFORMULARY OR COMPOUNDED ITEM    Vaginal estradiol cream 0.02%.  Place 1/2 gram per vagina at bedtime every other night for 10 days, then place 1/2 gram per vaginal at bedtime twice weekly.  Modified Medications   No medications on file  Discontinued Medications   No medications on file

## 2017-06-14 NOTE — Patient Instructions (Signed)
Today we updated your med list in our EPIC system...    Continue your current medications the same...  Today we checked a follow up CXR & FASTING blood work...    We will contact you w/ the results when available...   We decided to treat your bronchitic exacerbation with a Depomedrol shot, Medrol dosepak & Levaquin...   Take the [facility] 500mg  one tab daily x 7d til gone...    Start the Medrol dosepak tomorrow AM as directed on the pack...  Rest at home w/ lots of fluids...  Call for any questions...  Let's plan a follow up visit in 46mo, sooner if needed for problems...  As 2019 will be my last year- we discussed transitioning to [place] on the first floor of the [address] office...  DrJones, DrPlotnikov, DrCrawford, DrJohn, DrBurns are all excellent physicians.Marland KitchenMarland Kitchen

## 2017-06-17 LAB — VITAMIN D 1,25 DIHYDROXY
Vitamin D 1, 25 (OH)2 Total: 52 pg/mL (ref 18–72)
Vitamin D3 1, 25 (OH)2: 52 pg/mL

## 2017-06-24 ENCOUNTER — Encounter: Payer: Self-pay | Admitting: Obstetrics & Gynecology

## 2017-06-24 ENCOUNTER — Ambulatory Visit: Payer: Medicare Other | Admitting: Obstetrics & Gynecology

## 2017-06-24 VITALS — BP 136/60 | HR 68 | Resp 16 | Ht 62.0 in | Wt 117.0 lb

## 2017-06-24 DIAGNOSIS — N898 Other specified noninflammatory disorders of vagina: Secondary | ICD-10-CM

## 2017-06-24 DIAGNOSIS — T8389XD Other specified complication of genitourinary prosthetic devices, implants and grafts, subsequent encounter: Secondary | ICD-10-CM | POA: Diagnosis not present

## 2017-06-24 DIAGNOSIS — T8389XA Other specified complication of genitourinary prosthetic devices, implants and grafts, initial encounter: Secondary | ICD-10-CM

## 2017-06-24 DIAGNOSIS — N8111 Cystocele, midline: Secondary | ICD-10-CM

## 2017-06-24 NOTE — Progress Notes (Signed)
GYNECOLOGY  VISIT  CC:   Pessary check  HPI: 77 y.o. G45P1001 Widowed Caucasian female here for pessary check.  Denies vaginal bleeding or discharge.  Having no urinary issues or concerns.  GYNECOLOGIC HISTORY: Patient's last menstrual period was 02/16/1988 (approximate). Contraception: post menopausal  Menopausal hormone therapy: vaginal estradiol cream   Patient Active Problem List   Diagnosis Date Noted  . Intermittent palpitations 01/17/2017  . Right shoulder pain 12/14/2016  . Vertigo 04/02/2016  . Right carotid bruit 08/01/2015  . Uterine prolapse 06/13/2013  . Cystocele 06/13/2013  . Ex-cigarette smoker 05/04/2013  . Asthmatic bronchitis 05/04/2013  . 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 ESOPHAGEAL REFLUX 03/24/2010  . 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 HYPERCHOLESTEROLEMIA, BORDERLINE, WITH HIGH HDL 01/18/2008  . Disorder of bone and cartilage 01/18/2008  . COLONIC POLYPS 01/17/2008  . Hypothyroidism 01/17/2008  . Anxiety state 01/17/2008  . History of cardiovascular disorder 01/17/2008    Past Medical History:  Diagnosis Date  . COPD (chronic obstructive pulmonary disease) ([place])   . Hx of colonic polyps   . Hypercholesterolemia   . Hypothyroidism   . Melanoma of thigh ([place]) 02/15/2011  . MVP (mitral valve prolapse)   . Osteopenia   . Prolapsed uterus   . Varicose vein     Past Surgical History:  Procedure Laterality Date  . 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 Patient does not remember exact date of surgery.  . [place]  . MELANOMA EXCISION  12/31/2010   Procedure: MELANOMA EXCISION;  Surgeon: Zenovia Jarred, MD;  Location: [facility];  Service: General;  Laterality: Left;  wide excision melanoma left thigh, excision lesion left thigh  . OTHER SURGICAL HISTORY Right    Surgical repair of architectual distortion Right Breast- Hyperplasia  . TONSILLECTOMY      MEDS:   Current Outpatient Medications on  File Prior to Visit  Medication Sig Dispense Refill  . acyclovir ointment (ZOVIRAX) 5 % See admin instructions. Prn cold sores  0  . aspirin 81 MG tablet Take 81 mg by mouth daily.      . AZO-CRANBERRY PO Take by mouth daily.    . Calcium Carbonate-Vitamin D (CALCIUM + D PO) Take by mouth daily.      . Cholecalciferol (VITAMIN D) 2000 UNITS CAPS Take 1 capsule by mouth daily.    . cimetidine (TAGAMET) 400 MG tablet TAKE 1 TABLET BY MOUTH TWICE A DAY (Patient taking differently: prn) 60 tablet 0  . fluticasone (FLONASE) 50 MCG/ACT nasal spray PLACE 2 SPRAYS INTO BOTH NOSTRILS AS NEEDED. 16 g 2  . levothyroxine (SYNTHROID, LEVOTHROID) 88 MCG tablet Take 1 tablet (88 mcg total) by mouth daily before breakfast. 30 tablet 11  . LUTEIN PO Take 1 tablet by mouth daily.    . Multiple Vitamin (MULTIVITAMIN) tablet Take 1 tablet by mouth daily.      . nicotine polacrilex (NICORETTE) 2 MG gum Take 2 mg by mouth as needed. Not smoking    . NONFORMULARY OR COMPOUNDED ITEM Vaginal estradiol cream 0.02%.  Place 1/2 gram per vagina at bedtime every other night for 10 days, then place 1/2 gram per vaginal at bedtime twice weekly. 8 each 10   No current facility-administered medications on file prior to visit.     ALLERGIES: Codeine; Ciprofloxacin; and Sulfur  Family History  Problem Relation Age of Onset  . Hypertension Mother   . Congestive Heart Failure Mother   .  Thyroid disease Mother   . Stroke Father   . Heart attack Father 74  . Diabetes Paternal Grandmother   . Diabetes Paternal Grandfather   . Hypertension Maternal Grandfather   . CVA Maternal Grandfather   . Esophageal cancer Neg Hx   . Rectal cancer Neg Hx   . Stomach cancer Neg Hx   . Colon cancer Neg Hx     SH:  Widowed, non smoker  Review of Systems  All other systems reviewed and are negative.   PHYSICAL EXAMINATION:    BP 136/60 (BP Location: Right Arm, Patient Position: Sitting, Cuff Size: Normal)   Pulse 68   Resp 16    Ht 5\' 2"  (1.575 m)   Wt 117 lb (53.1 kg)   LMP 02/16/1988 (Approximate)   BMI 21.40 kg/m     General appearance: alert, cooperative and appears stated age Lymph:  No inguinal LAD  Pelvic: External genitalia:  no lesions              Urethra:  normal appearing urethra with no masses, tenderness or lesions              Bartholins and Skenes: normal                 Vagina: #3 incontinence ring with support pessary removed.  This continues to feel almost "stuck" at the vaginal apex with removal.  Small superficial ulceration present at apex.  Estrogen cream applied with scopette to this lesion.    Second degree cystocele present              Cervix: no lesions              Bimanual Exam:  Uterus:  normal size, contour, position, consistency, mobility, non-tender              Adnexa: no mass, fullness, tenderness   D/w pt trial of small pessary due to sensation that pessary is stuck when removal attempted.  Pt comfortable with this.  #2 incontinence ring with support is placed.  Pt comfortable after placement.  Able to walk and Valsalva with good support of cystocele.  Tolerating smaller pessary well.  Chaperone was present for exam.  Assessment: Cystocele Vaginal ulceration due to pessary  Plan: Fitting for smaller pessary occurred today.  Using now #2 incontinence ring with support.   ~20 minutes spent with patient >50% of time was in face to face discussion of above.

## 2017-07-18 ENCOUNTER — Ambulatory Visit: Payer: Medicare Other | Admitting: Obstetrics & Gynecology

## 2017-07-18 ENCOUNTER — Encounter: Payer: Self-pay | Admitting: Obstetrics & Gynecology

## 2017-07-18 VITALS — BP 140/80 | HR 68 | Resp 16 | Ht 62.0 in | Wt 118.2 lb

## 2017-07-18 DIAGNOSIS — N8111 Cystocele, midline: Secondary | ICD-10-CM | POA: Diagnosis not present

## 2017-07-18 NOTE — Progress Notes (Signed)
GYNECOLOGY  VISIT  CC:   Pessary check  HPI: 77 y.o. G76P1001 Widowed Caucasian female here for pessary check.  Smaller, #3 pessary, placed last month.  Pt is feeling her bladder come around the pessary so feels this is too small.  Denies vaginal bleeding or discharge.  Denies urinary changes.    GYNECOLOGIC HISTORY: Patient's last menstrual period was 02/16/1988 (approximate). Contraception: post menopausal  Menopausal hormone therapy: none  Patient Active Problem List   Diagnosis Date Noted  . Intermittent palpitations 01/17/2017  . Right shoulder pain 12/14/2016  . Vertigo 04/02/2016  . Right carotid bruit 08/01/2015  . Uterine prolapse 06/13/2013  . Cystocele 06/13/2013  . Ex-cigarette smoker 05/04/2013  . Asthmatic bronchitis 05/04/2013  . Melanoma of thigh ([place]) 02/15/2011  . ESOPHAGEAL REFLUX 03/24/2010  . 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 HYPERCHOLESTEROLEMIA, BORDERLINE, WITH HIGH HDL 01/18/2008  . Disorder of bone and cartilage 01/18/2008  . COLONIC POLYPS 01/17/2008  . Hypothyroidism 01/17/2008  . Anxiety state 01/17/2008  . History of cardiovascular disorder 01/17/2008    Past Medical History:  Diagnosis Date  . COPD (chronic obstructive pulmonary disease) ([place])   . Hx of colonic polyps   . Hypercholesterolemia   . Hypothyroidism   . Melanoma of thigh ([place]) 02/15/2011  . MVP (mitral valve prolapse)   . Osteopenia   . Prolapsed uterus   . Varicose vein     Past Surgical History:  Procedure Laterality Date  . 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 Patient does not remember exact date of surgery.  . [place]  . MELANOMA EXCISION  12/31/2010   Procedure: MELANOMA EXCISION;  Surgeon: Zenovia Jarred, MD;  Location: [facility];  Service: General;  Laterality: Left;  wide excision melanoma left thigh, excision lesion left thigh  . OTHER SURGICAL HISTORY Right    Surgical repair of architectual distortion Right  Breast- Hyperplasia  . TONSILLECTOMY      MEDS:   Current Outpatient Medications on File Prior to Visit  Medication Sig Dispense Refill  . acyclovir ointment (ZOVIRAX) 5 % See admin instructions. Prn cold sores  0  . aspirin 81 MG tablet Take 81 mg by mouth daily.      . AZO-CRANBERRY PO Take by mouth daily.    . Calcium Carbonate-Vitamin D (CALCIUM + D PO) Take by mouth daily.      . Cholecalciferol (VITAMIN D) 2000 UNITS CAPS Take 1 capsule by mouth daily.    . cimetidine (TAGAMET) 400 MG tablet TAKE 1 TABLET BY MOUTH TWICE A DAY (Patient taking differently: prn) 60 tablet 0  . fluticasone (FLONASE) 50 MCG/ACT nasal spray PLACE 2 SPRAYS INTO BOTH NOSTRILS AS NEEDED. 16 g 2  . levothyroxine (SYNTHROID, LEVOTHROID) 88 MCG tablet Take 1 tablet (88 mcg total) by mouth daily before breakfast. 30 tablet 11  . LUTEIN PO Take 1 tablet by mouth daily.    . Multiple Vitamin (MULTIVITAMIN) tablet Take 1 tablet by mouth daily.      . nicotine polacrilex (NICORETTE) 2 MG gum Take 2 mg by mouth as needed. Not smoking    . NONFORMULARY OR COMPOUNDED ITEM Vaginal estradiol cream 0.02%.  Place 1/2 gram per vagina at bedtime every other night for 10 days, then place 1/2 gram per vaginal at bedtime twice weekly. 8 each 10   No current facility-administered medications on file prior to visit.     ALLERGIES: Codeine; Ciprofloxacin; and Sulfur  Family History  Problem Relation Age of Onset  . Hypertension Mother   . Congestive Heart Failure Mother   . Thyroid disease Mother   . Stroke Father   . Heart attack Father 64  . Diabetes Paternal Grandmother   . Diabetes Paternal Grandfather   . Hypertension Maternal Grandfather   . CVA Maternal Grandfather   . Esophageal cancer Neg Hx   . Rectal cancer Neg Hx   . Stomach cancer Neg Hx   . Colon cancer Neg Hx     SH:  Widowed, non smoker  Review of Systems  All other systems reviewed and are negative.   PHYSICAL EXAMINATION:    BP 140/80 (BP  Location: Right Arm, Patient Position: Sitting, Cuff Size: Normal)   Pulse 68   Resp 16   Ht 5\' 2"  (1.575 m)   Wt 118 lb 3.2 oz (53.6 kg)   LMP 02/16/1988 (Approximate)   BMI 21.62 kg/m     General appearance: alert, cooperative and appears stated age Lymph: no inguinal LAD Pelvic: External genitalia:  no lesions              Urethra:  normal appearing urethra with no masses, tenderness or lesions              Bartholins and Skenes: normal                 Vagina: pessary removed, normal appearing vagina with small apex ulceration.  Estrogen cream applied.  #4 pessary placed.                Cervix: no lesions              Bimanual Exam:  Uterus:  normal size, contour, position, consistency, mobility, non-tender              Adnexa: no mass, fullness, tenderness  Chaperone was present for exam.  Assessment: Cystocele Vaginal ulceration  Plan: Larger pessary placed again today (#4).  Feel she would benefit from a smaller pessary but was uncomfortable with bladder slipping around the pessary.  Recommendations discussed.     ~15 minutes spent with patient >50% of time was in face to face discussion of above.

## 2017-08-04 ENCOUNTER — Ambulatory Visit: Payer: Medicare Other | Admitting: Obstetrics & Gynecology

## 2017-08-15 ENCOUNTER — Ambulatory Visit: Payer: Medicare Other | Admitting: Obstetrics & Gynecology

## 2017-08-15 ENCOUNTER — Ambulatory Visit: Payer: Medicare Other | Admitting: Obstetrics and Gynecology

## 2017-08-15 ENCOUNTER — Encounter: Payer: Self-pay | Admitting: Obstetrics & Gynecology

## 2017-08-15 ENCOUNTER — Other Ambulatory Visit: Payer: Self-pay

## 2017-08-15 VITALS — BP 118/62 | HR 60 | Resp 16 | Ht 62.0 in | Wt 117.0 lb

## 2017-08-15 DIAGNOSIS — K644 Residual hemorrhoidal skin tags: Secondary | ICD-10-CM

## 2017-08-15 DIAGNOSIS — N8111 Cystocele, midline: Secondary | ICD-10-CM | POA: Diagnosis not present

## 2017-08-15 DIAGNOSIS — N814 Uterovaginal prolapse, unspecified: Secondary | ICD-10-CM

## 2017-08-15 DIAGNOSIS — Z4689 Encounter for fitting and adjustment of other specified devices: Secondary | ICD-10-CM | POA: Diagnosis not present

## 2017-08-15 MED ORDER — HYDROCORTISONE 2.5 % RE CREA: 1.0000 "application " | TOPICAL_CREAM | Freq: Four times a day (QID) | RECTAL | 1 refills | Status: DC

## 2017-08-15 NOTE — Progress Notes (Signed)
GYNECOLOGY  VISIT  CC:   Pessary check  HPI: 77 y.o. G74P1001 Widowed Caucasian female here for pessary check.  Denies vaginal bleeding and vaginal bleeding.  Having no urinary issues today.  Requests a prescription for hemorrhoids.  Uses preparation H but sometimes this doesn't seem like it's enough.  Does at times have to push her hemorrhoids back inside the rectum.  Does not have rectal bleeding just itching/burning and irritation.  Does not want to see anyone else for this.    GYNECOLOGIC HISTORY: Patient's last menstrual period was 02/16/1988 (approximate). Contraception: post menopausal Menopausal hormone therapy: vaginal estradiol cream 0.02%  Patient Active Problem List   Diagnosis Date Noted  . Intermittent palpitations 01/17/2017  . Right shoulder pain 12/14/2016  . Vertigo 04/02/2016  . Right carotid bruit 08/01/2015  . Uterine prolapse 06/13/2013  . Cystocele 06/13/2013  . Ex-cigarette smoker 05/04/2013  . Asthmatic bronchitis 05/04/2013  . Melanoma of thigh ([place]) 02/15/2011  . ESOPHAGEAL REFLUX 03/24/2010  . 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 HYPERCHOLESTEROLEMIA, BORDERLINE, WITH HIGH HDL 01/18/2008  . Disorder of bone and cartilage 01/18/2008  . COLONIC POLYPS 01/17/2008  . Hypothyroidism 01/17/2008  . Anxiety state 01/17/2008  . History of cardiovascular disorder 01/17/2008    Past Medical History:  Diagnosis Date  . COPD (chronic obstructive pulmonary disease) ([place])   . Hx of colonic polyps   . Hypercholesterolemia   . Hypothyroidism   . 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 MVP (mitral valve prolapse)   . Osteopenia   . Prolapsed uterus   . Varicose vein     Past Surgical History:  Procedure Laterality Date  . 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 Patient does not remember exact date of surgery.  . [place]  . MELANOMA EXCISION  12/31/2010   Procedure: MELANOMA EXCISION;  Surgeon: Zenovia Jarred, MD;  Location: [facility];  Service: General;  Laterality: Left;  wide excision melanoma left thigh, excision lesion left thigh  . OTHER SURGICAL HISTORY Right    Surgical repair of architectual distortion Right Breast- Hyperplasia  . TONSILLECTOMY      MEDS:   Current Outpatient Medications on File Prior to Visit  Medication Sig Dispense Refill  . acyclovir ointment (ZOVIRAX) 5 % See admin instructions. Prn cold sores  0  . aspirin 81 MG tablet Take 81 mg by mouth daily.      . AZO-CRANBERRY PO Take by mouth daily.    . Calcium Carbonate-Vitamin D (CALCIUM + D PO) Take by mouth daily.      . Cholecalciferol (VITAMIN D) 2000 UNITS CAPS Take 1 capsule by mouth daily.    . cimetidine (TAGAMET) 400 MG tablet TAKE 1 TABLET BY MOUTH TWICE A DAY (Patient taking differently: prn) 60 tablet 0  . fluticasone (FLONASE) 50 MCG/ACT nasal spray PLACE 2 SPRAYS INTO BOTH NOSTRILS AS NEEDED. 16 g 2  . levothyroxine (SYNTHROID, LEVOTHROID) 88 MCG tablet Take 1 tablet (88 mcg total) by mouth daily before breakfast. 30 tablet 11  . LUTEIN PO Take 1 tablet by mouth daily.    . Multiple Vitamin (MULTIVITAMIN) tablet Take 1 tablet by mouth daily.      . nicotine polacrilex (NICORETTE) 2 MG gum Take 2 mg by mouth as needed. Not smoking    . NONFORMULARY OR COMPOUNDED ITEM Vaginal estradiol cream 0.02%.  Place 1/2 gram per vagina at bedtime every other night for 10 days, then  place 1/2 gram per vaginal at bedtime twice weekly. 8 each 10   No current facility-administered medications on file prior to visit.     ALLERGIES: Codeine; Ciprofloxacin; and Sulfur  Family History  Problem Relation Age of Onset  . Hypertension Mother   . Congestive Heart Failure Mother   . Thyroid disease Mother   . Stroke Father   . Heart attack Father 68  . Diabetes Paternal Grandmother   . Diabetes Paternal Grandfather   . Hypertension Maternal Grandfather   . CVA Maternal Grandfather   . Esophageal cancer Neg Hx   . Rectal cancer  Neg Hx   . Stomach cancer Neg Hx   . Colon cancer Neg Hx     SH:  Widowed, former smoker  Review of Systems  Constitutional: Negative.   HENT: Negative.   Eyes: Negative.   Respiratory: Negative.   Cardiovascular: Negative.   Gastrointestinal: Negative.   Genitourinary: Negative.   Musculoskeletal: Negative.   Skin: Negative.   Neurological: Negative.   Endo/Heme/Allergies: Negative.   Psychiatric/Behavioral: Negative.     PHYSICAL EXAMINATION:    BP 118/62   Pulse 60   Resp 16   Ht 5\' 2"  (1.575 m)   Wt 117 lb (53.1 kg)   LMP 02/16/1988 (Approximate)   BMI 21.40 kg/m     General appearance: alert, cooperative and appears stated age Lymph:  No inguinal LAD  Pelvic: External genitalia:  no lesions              Urethra:  normal appearing urethra with no masses, tenderness or lesions              Bartholins and Skenes: normal                 Vagina: normal appearing vagina with normal color and discharge, a very superficial pressure ulceration is present at the vaginal apex with no bleeding.  Estrogen cream applied to this area              Cervix: no lesions              Bimanual Exam:  Uterus:  normal size, contour, position, consistency, mobility, non-tender              Adnexa: no mass, fullness, tenderness              Anus:  Mild external hemorrhoids noted  Chaperone was present for exam.  Assessment: Cystocele hemorrhoids  Plan: Recheck 1 month.  Pt will get estrogen cream refilled.  Has multiple refills available at [facility].  Advised how to call for refills. Rx for Anusol HC 2.5% topically every 6 hours as needed to pharmacy.

## 2017-09-23 ENCOUNTER — Encounter: Payer: Self-pay | Admitting: Obstetrics & Gynecology

## 2017-09-23 ENCOUNTER — Ambulatory Visit: Payer: Medicare Other | Admitting: Obstetrics & Gynecology

## 2017-09-23 VITALS — BP 140/70 | HR 68 | Resp 16 | Ht 62.0 in | Wt 117.0 lb

## 2017-09-23 DIAGNOSIS — N760 Acute vaginitis: Secondary | ICD-10-CM

## 2017-09-23 DIAGNOSIS — N8111 Cystocele, midline: Secondary | ICD-10-CM

## 2017-09-23 DIAGNOSIS — B9689 Other specified bacterial agents as the cause of diseases classified elsewhere: Secondary | ICD-10-CM

## 2017-09-23 MED ORDER — METRONIDAZOLE 0.75 % VA GEL: 1.0000 | Freq: Every day | VAGINAL | 0 refills | Status: DC

## 2017-09-23 NOTE — Progress Notes (Signed)
GYNECOLOGY  VISIT  CC:   Pessary check  HPI: 77 y.o. G71P1001 Widowed Caucasian female here for pessary check.  She is having some vaginal discharge that has some odor.  Denies vaginal bleeding.    GYNECOLOGIC HISTORY: Patient's last menstrual period was 02/16/1988 (approximate). Contraception: post menopausal  Menopausal hormone therapy: none  Patient Active Problem List   Diagnosis Date Noted  . Intermittent palpitations 01/17/2017  . Right shoulder pain 12/14/2016  . Vertigo 04/02/2016  . Right carotid bruit 08/01/2015  . Uterine prolapse 06/13/2013  . Cystocele 06/13/2013  . Ex-cigarette smoker 05/04/2013  . Asthmatic bronchitis 05/04/2013  . 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 ESOPHAGEAL REFLUX 03/24/2010  . 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 HYPERCHOLESTEROLEMIA, BORDERLINE, WITH HIGH HDL 01/18/2008  . Disorder of bone and cartilage 01/18/2008  . COLONIC POLYPS 01/17/2008  . Hypothyroidism 01/17/2008  . Anxiety state 01/17/2008  . History of cardiovascular disorder 01/17/2008    Past Medical History:  Diagnosis Date  . COPD (chronic obstructive pulmonary disease) ([place])   . Hx of colonic polyps   . Hypercholesterolemia   . Hypothyroidism   . 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 MVP (mitral valve prolapse)   . Osteopenia   . Prolapsed uterus   . Varicose vein     Past Surgical History:  Procedure Laterality Date  . 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 Patient does not remember exact date of surgery.  . [place]  . MELANOMA EXCISION  12/31/2010   Procedure: MELANOMA EXCISION;  Surgeon: Zenovia Jarred, MD;  Location: [facility];  Service: General;  Laterality: Left;  wide excision melanoma left thigh, excision lesion left thigh  . OTHER SURGICAL HISTORY Right    Surgical repair of architectual distortion Right Breast- Hyperplasia  . TONSILLECTOMY      MEDS:   Current Outpatient Medications on File Prior to  Visit  Medication Sig Dispense Refill  . acyclovir ointment (ZOVIRAX) 5 % See admin instructions. Prn cold sores  0  . aspirin 81 MG tablet Take 81 mg by mouth daily.      . Calcium Carbonate-Vitamin D (CALCIUM + D PO) Take by mouth daily.      . Cholecalciferol (VITAMIN D) 2000 UNITS CAPS Take 1 capsule by mouth daily.    . cimetidine (TAGAMET) 400 MG tablet TAKE 1 TABLET BY MOUTH TWICE A DAY (Patient taking differently: prn) 60 tablet 0  . fluticasone (FLONASE) 50 MCG/ACT nasal spray PLACE 2 SPRAYS INTO BOTH NOSTRILS AS NEEDED. 16 g 2  . hydrocortisone (ANUSOL-HC) 2.5 % rectal cream Place 1 application rectally 4 (four) times daily. 30 g 1  . levothyroxine (SYNTHROID, LEVOTHROID) 88 MCG tablet Take 1 tablet (88 mcg total) by mouth daily before breakfast. 30 tablet 11  . LUTEIN PO Take 1 tablet by mouth daily.    . Multiple Vitamin (MULTIVITAMIN) tablet Take 1 tablet by mouth daily.      . nicotine polacrilex (NICORETTE) 2 MG gum Take 2 mg by mouth as needed. Not smoking    . NONFORMULARY OR COMPOUNDED ITEM Vaginal estradiol cream 0.02%.  Place 1/2 gram per vagina at bedtime every other night for 10 days, then place 1/2 gram per vaginal at bedtime twice weekly. 8 each 10   No current facility-administered medications on file prior to visit.     ALLERGIES: Codeine; Ciprofloxacin; and Sulfur  Family History  Problem Relation Age of Onset  .  Hypertension Mother   . Congestive Heart Failure Mother   . Thyroid disease Mother   . Stroke Father   . Heart attack Father 65  . Diabetes Paternal Grandmother   . Diabetes Paternal Grandfather   . Hypertension Maternal Grandfather   . CVA Maternal Grandfather   . Esophageal cancer Neg Hx   . Rectal cancer Neg Hx   . Stomach cancer Neg Hx   . Colon cancer Neg Hx     SH:  Widowed, non smoker  Review of Systems  All other systems reviewed and are negative.   PHYSICAL EXAMINATION:    BP 140/70 (BP Location: Right Arm, Patient Position:  Sitting, Cuff Size: Normal)   Pulse 68   Resp 16   Ht 5\' 2"  (1.575 m)   Wt 117 lb (53.1 kg)   LMP 02/16/1988 (Approximate)   BMI 21.40 kg/m     General appearance: alert, cooperative and appears stated age Lymph:  no inguinal LAD noted  Pelvic: External genitalia:  no lesions              Urethra:  normal appearing urethra with no masses, tenderness or lesions              Bartholins and Skenes: normal                 Vagina: pessary removed, increased but superficial ulceration present at the vaginal apex, no bleeding noted.  Estrogen applied to this lesion.  Pessary left out.                Cervix: no lesions              Bimanual Exam:  Uterus:  normal size, contour, position, consistency, mobility, non-tender              Adnexa: no mass, fullness, tenderness  Chaperone was present for exam.  Assessment: Cystocele Increased pressure ulceration BV  Plan: Will treat with metrogel 0.75% nightly x 5 nights. Pessary will be left out and pt will return for recheck and hopefully replacement next week.

## 2017-09-26 NOTE — Progress Notes (Deleted)
GYNECOLOGY  VISIT   HPI: 77 y.o.   Widowed  Caucasian  female   G1P1001 with Patient's last menstrual period was 02/16/1988 (approximate).   here for hemorrhoids and cystocele follow up.   GYNECOLOGIC HISTORY: Patient's last menstrual period was 02/16/1988 (approximate). Contraception:  Post menopausal Menopausal hormone therapy:  vaginal estradiol cream 0.02% Last mammogram:  12/31/2016 BIRADS 2:Benign Last pap smear:   ***        OB History    Gravida  1   Para  1   Term  1   Preterm      AB      Living  1     SAB      TAB      Ectopic      Multiple      Live Births                 Patient Active Problem List   Diagnosis Date Noted  . Intermittent palpitations 01/17/2017  . Right shoulder pain 12/14/2016  . Vertigo 04/02/2016  . Right carotid bruit 08/01/2015  . Uterine prolapse 06/13/2013  . Cystocele 06/13/2013  . Ex-cigarette smoker 05/04/2013  . Asthmatic bronchitis 05/04/2013  . Melanoma of thigh ([place]) 02/15/2011  . ESOPHAGEAL REFLUX 03/24/2010  . 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 HYPERCHOLESTEROLEMIA, BORDERLINE, WITH HIGH HDL 01/18/2008  . Disorder of bone and cartilage 01/18/2008  . COLONIC POLYPS 01/17/2008  . Hypothyroidism 01/17/2008  . Anxiety state 01/17/2008  . History of cardiovascular disorder 01/17/2008    Past Medical History:  Diagnosis Date  . COPD (chronic obstructive pulmonary disease) ([place])   . Hx of colonic polyps   . Hypercholesterolemia   . Hypothyroidism   . 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 MVP (mitral valve prolapse)   . Osteopenia   . Prolapsed uterus   . Varicose vein     Past Surgical History:  Procedure Laterality Date  . 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 Patient does not remember exact date of surgery.  . [place]  . MELANOMA EXCISION  12/31/2010   Procedure: MELANOMA EXCISION;  Surgeon: Zenovia Jarred, MD;  Location: [facility];  Service:  General;  Laterality: Left;  wide excision melanoma left thigh, excision lesion left thigh  . OTHER SURGICAL HISTORY Right    Surgical repair of architectual distortion Right Breast- Hyperplasia  . TONSILLECTOMY      Current Outpatient Medications  Medication Sig Dispense Refill  . acyclovir ointment (ZOVIRAX) 5 % See admin instructions. Prn cold sores  0  . aspirin 81 MG tablet Take 81 mg by mouth daily.      . Calcium Carbonate-Vitamin D (CALCIUM + D PO) Take by mouth daily.      . Cholecalciferol (VITAMIN D) 2000 UNITS CAPS Take 1 capsule by mouth daily.    . cimetidine (TAGAMET) 400 MG tablet TAKE 1 TABLET BY MOUTH TWICE A DAY (Patient taking differently: prn) 60 tablet 0  . fluticasone (FLONASE) 50 MCG/ACT nasal spray PLACE 2 SPRAYS INTO BOTH NOSTRILS AS NEEDED. 16 g 2  . hydrocortisone (ANUSOL-HC) 2.5 % rectal cream Place 1 application rectally 4 (four) times daily. 30 g 1  . levothyroxine (SYNTHROID, LEVOTHROID) 88 MCG tablet Take 1 tablet (88 mcg total) by mouth daily before breakfast. 30 tablet 11  . LUTEIN PO Take 1 tablet by mouth daily.    . metroNIDAZOLE (METROGEL) 0.75 % vaginal gel Place  1 Applicatorful vaginally at bedtime. 70 g 0  . Multiple Vitamin (MULTIVITAMIN) tablet Take 1 tablet by mouth daily.      . nicotine polacrilex (NICORETTE) 2 MG gum Take 2 mg by mouth as needed. Not smoking    . NONFORMULARY OR COMPOUNDED ITEM Vaginal estradiol cream 0.02%.  Place 1/2 gram per vagina at bedtime every other night for 10 days, then place 1/2 gram per vaginal at bedtime twice weekly. 8 each 10   No current facility-administered medications for this visit.      ALLERGIES: Codeine; Ciprofloxacin; and Sulfur  Family History  Problem Relation Age of Onset  . Hypertension Mother   . Congestive Heart Failure Mother   . Thyroid disease Mother   . Stroke Father   . Heart attack Father 24  . Diabetes Paternal Grandmother   . Diabetes Paternal Grandfather   . Hypertension  Maternal Grandfather   . CVA Maternal Grandfather   . Esophageal cancer Neg Hx   . Rectal cancer Neg Hx   . Stomach cancer Neg Hx   . Colon cancer Neg Hx     Social History   Socioeconomic History  . Marital status: Widowed    Spouse name: fred Bodin  . Number of children: Not on file  . Years of education: Not on file  . Highest education level: Not on file  Occupational History  . Not on file  Social Needs  . Financial resource strain: Not on file  . Food insecurity:    Worry: Not on file    Inability: Not on file  . Transportation needs:    Medical: Not on file    Non-medical: Not on file  Tobacco Use  . Smoking status: Former Smoker    Packs/day: 1.00    Years: 50.00    Pack years: 50.00    Types: Cigarettes    Start date: 03/08/1960    Last attempt to quit: 07/23/2010    Years since quitting: 7.1  . Smokeless tobacco: Never Used  . Tobacco comment: quit smoking 3 years ago  Substance and Sexual Activity  . Alcohol use: No    Alcohol/week: 0.0 standard drinks  . Drug use: No  . Sexual activity: Not Currently    Partners: Male    Birth control/protection: Post-menopausal  Lifestyle  . Physical activity:    Days per week: Not on file    Minutes per session: Not on file  . Stress: Not on file  Relationships  . Social connections:    Talks on phone: Not on file    Gets together: Not on file    Attends religious service: Not on file    Active member of club or organization: Not on file    Attends meetings of clubs or organizations: Not on file    Relationship status: Not on file  . Intimate partner violence:    Fear of current or ex partner: Not on file    Emotionally abused: Not on file    Physically abused: Not on file    Forced sexual activity: Not on file  Other Topics Concern  . Not on file  Social History Narrative  . Not on file    Review of Systems  PHYSICAL EXAMINATION:    LMP 02/16/1988 (Approximate)     General appearance: alert,  cooperative and appears stated age Head: Normocephalic, without obvious abnormality, atraumatic Neck: no adenopathy, supple, symmetrical, trachea midline and thyroid normal to inspection and palpation Lungs: clear to  auscultation bilaterally Breasts: normal appearance, no masses or tenderness, No nipple retraction or dimpling, No nipple discharge or bleeding, No axillary or supraclavicular adenopathy Heart: regular rate and rhythm Abdomen: soft, non-tender, no masses,  no organomegaly Extremities: extremities normal, atraumatic, no cyanosis or edema Skin: Skin color, texture, turgor normal. No rashes or lesions Lymph nodes: Cervical, supraclavicular, and axillary nodes normal. No abnormal inguinal nodes palpated Neurologic: Grossly normal  Pelvic: External genitalia:  no lesions              Urethra:  normal appearing urethra with no masses, tenderness or lesions              Bartholins and Skenes: normal                 Vagina: normal appearing vagina with normal color and discharge, no lesions              Cervix: no lesions                Bimanual Exam:  Uterus:  normal size, contour, position, consistency, mobility, non-tender              Adnexa: no mass, fullness, tenderness              Rectal exam: {yes no:[id number]}.  Confirms.              Anus:  normal sphincter tone, no lesions  Chaperone was present for exam.  ASSESSMENT     PLAN     An After Visit Summary was printed and given to the patient.  ______ minutes face to face time of which over 50% was spent in counseling.

## 2017-09-27 ENCOUNTER — Encounter: Payer: Self-pay | Admitting: Obstetrics & Gynecology

## 2017-09-27 ENCOUNTER — Ambulatory Visit: Payer: Medicare Other | Admitting: Obstetrics & Gynecology

## 2017-09-27 VITALS — BP 152/66 | HR 60 | Temp 98.2°F | Resp 14 | Ht 62.0 in | Wt 119.0 lb

## 2017-09-27 DIAGNOSIS — N8111 Cystocele, midline: Secondary | ICD-10-CM | POA: Diagnosis not present

## 2017-09-27 DIAGNOSIS — Z4689 Encounter for fitting and adjustment of other specified devices: Secondary | ICD-10-CM

## 2017-09-27 DIAGNOSIS — T8389XD Other specified complication of genitourinary prosthetic devices, implants and grafts, subsequent encounter: Secondary | ICD-10-CM

## 2017-09-27 DIAGNOSIS — N898 Other specified noninflammatory disorders of vagina: Secondary | ICD-10-CM

## 2017-09-27 NOTE — Progress Notes (Signed)
GYNECOLOGY  VISIT  CC:   Pessary placement  HPI: 77 y.o. G29P1001 Widowed Caucasian female here for follow up pessary placement.  She was seen last week due to vaginal discharge and BV.  She was treated with Metrogel.    Reports she didn't feel well the last 24 hours.  Two nights ago, she woke up feeling really hot and uncomfortable.  She had diarrhea Monday night and yesterday.  Yesterday afternoon she felt achy as well.  This all seems to have improved at this point.  Wonders if she had a virus.  She thinks she may have come from some pre-made macaroni salad from [employer] on Sunday and had this on a salad on Monday.  Wonders if this was the cause.    GYNECOLOGIC HISTORY: Patient's last menstrual period was 02/16/1988 (approximate). Contraception: post menopausal  Menopausal hormone therapy: none  Patient Active Problem List   Diagnosis Date Noted  . Intermittent palpitations 01/17/2017  . Right shoulder pain 12/14/2016  . Vertigo 04/02/2016  . Right carotid bruit 08/01/2015  . Uterine prolapse 06/13/2013  . Cystocele 06/13/2013  . Ex-cigarette smoker 05/04/2013  . Asthmatic bronchitis 05/04/2013  . 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 ESOPHAGEAL REFLUX 03/24/2010  . 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 HYPERCHOLESTEROLEMIA, BORDERLINE, WITH HIGH HDL 01/18/2008  . Disorder of bone and cartilage 01/18/2008  . COLONIC POLYPS 01/17/2008  . Hypothyroidism 01/17/2008  . Anxiety state 01/17/2008  . History of cardiovascular disorder 01/17/2008    Past Medical History:  Diagnosis Date  . COPD (chronic obstructive pulmonary disease) ([place])   . Hx of colonic polyps   . Hypercholesterolemia   . Hypothyroidism   . 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 MVP (mitral valve prolapse)   . Osteopenia   . Prolapsed uterus   . Varicose vein     Past Surgical History:  Procedure Laterality Date  . 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 Patient does not remember exact  date of surgery.  . [place]  . MELANOMA EXCISION  12/31/2010   Procedure: MELANOMA EXCISION;  Surgeon: Zenovia Jarred, MD;  Location: [facility];  Service: General;  Laterality: Left;  wide excision melanoma left thigh, excision lesion left thigh  . OTHER SURGICAL HISTORY Right    Surgical repair of architectual distortion Right Breast- Hyperplasia  . TONSILLECTOMY      MEDS:   Current Outpatient Medications on File Prior to Visit  Medication Sig Dispense Refill  . acyclovir ointment (ZOVIRAX) 5 % See admin instructions. Prn cold sores  0  . aspirin 81 MG tablet Take 81 mg by mouth daily.      . Calcium Carbonate-Vitamin D (CALCIUM + D PO) Take by mouth daily.      . Cholecalciferol (VITAMIN D) 2000 UNITS CAPS Take 1 capsule by mouth daily.    . cimetidine (TAGAMET) 400 MG tablet TAKE 1 TABLET BY MOUTH TWICE A DAY (Patient taking differently: prn) 60 tablet 0  . fluticasone (FLONASE) 50 MCG/ACT nasal spray PLACE 2 SPRAYS INTO BOTH NOSTRILS AS NEEDED. 16 g 2  . hydrocortisone (ANUSOL-HC) 2.5 % rectal cream Place 1 application rectally 4 (four) times daily. 30 g 1  . levothyroxine (SYNTHROID, LEVOTHROID) 88 MCG tablet Take 1 tablet (88 mcg total) by mouth daily before breakfast. 30 tablet 11  . LUTEIN PO Take 1 tablet by mouth daily.    . metroNIDAZOLE (METROGEL) 0.75 % vaginal gel  Place 1 Applicatorful vaginally at bedtime. 70 g 0  . Multiple Vitamin (MULTIVITAMIN) tablet Take 1 tablet by mouth daily.      . nicotine polacrilex (NICORETTE) 2 MG gum Take 2 mg by mouth as needed. Not smoking    . NONFORMULARY OR COMPOUNDED ITEM Vaginal estradiol cream 0.02%.  Place 1/2 gram per vagina at bedtime every other night for 10 days, then place 1/2 gram per vaginal at bedtime twice weekly. 8 each 10   No current facility-administered medications on file prior to visit.     ALLERGIES: Codeine; Ciprofloxacin; and Sulfur  Family History  Problem Relation Age of Onset   . Hypertension Mother   . Congestive Heart Failure Mother   . Thyroid disease Mother   . Stroke Father   . Heart attack Father 37  . Diabetes Paternal Grandmother   . Diabetes Paternal Grandfather   . Hypertension Maternal Grandfather   . CVA Maternal Grandfather   . Esophageal cancer Neg Hx   . Rectal cancer Neg Hx   . Stomach cancer Neg Hx   . Colon cancer Neg Hx     SH:  Widowed, non smoker  Review of Systems  All other systems reviewed and are negative.   PHYSICAL EXAMINATION:    BP (!) 152/66 (BP Location: Right Arm, Patient Position: Sitting, Cuff Size: Normal)   Pulse 60   Resp 14   Ht 5\' 2"  (1.575 m)   Wt 119 lb (54 kg)   LMP 02/16/1988 (Approximate)   BMI 21.77 kg/m     General appearance: alert, cooperative and appears stated age Lymph:  no inguinal LAD noted  Pelvic: External genitalia:  no lesions              Urethra:  normal appearing urethra with no masses, tenderness or lesions              Bartholins and Skenes: normal                 Vagina: area of pressure ulceration is improved, pessary replaced without difficulty              Cervix: no lesions              Bimanual Exam:  Uterus:  normal size, contour, position, consistency, mobility, non-tender              Adnexa: no mass, fullness, tenderness  Chaperone was present for exam.  Assessment: Cystocele Evidence of pressure ulceration behind cervix that is improved  Plan: Pessary replaced.  She will return in one month.

## 2017-10-25 HISTORY — PX: CATARACT EXTRACTION: SUR2

## 2017-10-27 ENCOUNTER — Ambulatory Visit: Payer: Medicare Other | Admitting: Obstetrics & Gynecology

## 2017-11-04 ENCOUNTER — Ambulatory Visit: Payer: Medicare Other | Admitting: Obstetrics & Gynecology

## 2017-11-04 ENCOUNTER — Encounter: Payer: Self-pay | Admitting: Obstetrics & Gynecology

## 2017-11-04 VITALS — BP 130/60 | HR 60 | Resp 14 | Ht 62.0 in | Wt 117.2 lb

## 2017-11-04 DIAGNOSIS — N8111 Cystocele, midline: Secondary | ICD-10-CM

## 2017-11-04 NOTE — Progress Notes (Signed)
GYNECOLOGY  VISIT  CC:   Pessary check  HPI: 77 y.o. G57P1001 [name] here for pessary check.  Denies vaginal bleeding or VB.  Denies pelvic pain.  Denies urinary issues.   GYNECOLOGIC HISTORY: Patient's last menstrual period was 02/16/1988 (approximate). Contraception: post menopausal  Menopausal hormone therapy: none  Patient Active Problem List   Diagnosis Date Noted  . Intermittent palpitations 01/17/2017  . Right shoulder pain 12/14/2016  . Vertigo 04/02/2016  . Right carotid bruit 08/01/2015  . Uterine prolapse 06/13/2013  . Cystocele 06/13/2013  . Ex-cigarette smoker 05/04/2013  . Asthmatic bronchitis 05/04/2013  . Melanoma of thigh ([place]) 02/15/2011  . ESOPHAGEAL REFLUX 03/24/2010  . 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 HYPERCHOLESTEROLEMIA, BORDERLINE, WITH HIGH HDL 01/18/2008  . Disorder of bone and cartilage 01/18/2008  . COLONIC POLYPS 01/17/2008  . Hypothyroidism 01/17/2008  . Anxiety state 01/17/2008  . History of cardiovascular disorder 01/17/2008    Past Medical History:  Diagnosis Date  . COPD (chronic obstructive pulmonary disease) ([place])   . Hx of colonic polyps   . Hypercholesterolemia   . Hypothyroidism   . 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 MVP (mitral valve prolapse)   . Osteopenia   . Prolapsed uterus   . Varicose vein     Past Surgical History:  Procedure Laterality Date  . 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 Patient does not remember exact date of surgery.  [name] CATARACT EXTRACTION Right 10/25/2017  . [place]  . MELANOMA EXCISION  12/31/2010   Procedure: MELANOMA EXCISION;  Surgeon: Zenovia Jarred, MD;  Location: [facility];  Service: General;  Laterality: Left;  wide excision melanoma left thigh, excision lesion left thigh  . OTHER SURGICAL HISTORY Right    Surgical repair of architectual distortion Right Breast- Hyperplasia  . TONSILLECTOMY      MEDS:    Current Outpatient Medications on File Prior to Visit  Medication Sig Dispense Refill  . acyclovir ointment (ZOVIRAX) 5 % See admin instructions. Prn cold sores  0  . aspirin 81 MG tablet Take 81 mg by mouth daily.      . Calcium Carbonate-Vitamin D (CALCIUM + D PO) Take by mouth daily.      . Cholecalciferol (VITAMIN D) 2000 UNITS CAPS Take 1 capsule by mouth daily.    . cimetidine (TAGAMET) 400 MG tablet TAKE 1 TABLET BY MOUTH TWICE A DAY (Patient taking differently: prn) 60 tablet 0  . DUREZOL 0.05 % EMUL STARTING 2 DAYS BEFORE SURGERY, PLACE 1 DROP INTO RIGHT EYE 4 TIMES A DAY  0  . fluticasone (FLONASE) 50 MCG/ACT nasal spray PLACE 2 SPRAYS INTO BOTH NOSTRILS AS NEEDED. 16 g 2  . hydrocortisone (ANUSOL-HC) 2.5 % rectal cream Place 1 application rectally 4 (four) times daily. 30 g 1  . levothyroxine (SYNTHROID, LEVOTHROID) 88 MCG tablet Take 1 tablet (88 mcg total) by mouth daily before breakfast. 30 tablet 11  . LUTEIN PO Take 1 tablet by mouth daily.    . Multiple Vitamin (MULTIVITAMIN) tablet Take 1 tablet by mouth daily.      . nicotine polacrilex (NICORETTE) 2 MG gum Take 2 mg by mouth as needed. Not smoking    . NONFORMULARY OR COMPOUNDED ITEM Vaginal estradiol cream 0.02%.  Place 1/2 gram per vagina at bedtime every other night for 10 days, then place 1/2 gram per vaginal at bedtime twice weekly. 8 each 10  No current facility-administered medications on file prior to visit.     ALLERGIES: Codeine; Ciprofloxacin; and Sulfur  Family History  Problem Relation Age of Onset  . Hypertension Mother   . Congestive Heart Failure Mother   . Thyroid disease Mother   . Stroke Father   . Heart attack Father 68  . Diabetes Paternal Grandmother   . Diabetes Paternal Grandfather   . Hypertension Maternal Grandfather   . CVA Maternal Grandfather   . Esophageal cancer Neg Hx   . Rectal cancer Neg Hx   . Stomach cancer Neg Hx   . Colon cancer Neg Hx     SH:  Widowed, non  smoker  Review of Systems  PHYSICAL EXAMINATION:    BP 130/60 (BP Location: Right Arm, Patient Position: Sitting, Cuff Size: Normal)   Pulse 60   Resp 14   Ht 5\' 2"  (1.575 m)   Wt 117 lb 3.2 oz (53.2 kg)   LMP 02/16/1988 (Approximate)   BMI 21.44 kg/m     General appearance: alert, cooperative and appears stated age Lymph:  no inguinal LAD noted  Pelvic: External genitalia:  no lesions              Urethra:  normal appearing urethra with no masses, tenderness or lesions              Bartholins and Skenes: normal                 Vagina: very small ulceration underneath cervix, no discharge, no lesions              Cervix: no lesions              Bimanual Exam:  Uterus:  normal size, contour, position, consistency, mobility, non-tender               Chaperone was present for exam.  Assessment: Cystocele Very small pressure ulceration inferior to cervix, looks very good today  Plan: Recheck 1 month

## 2017-11-14 ENCOUNTER — Ambulatory Visit (INDEPENDENT_AMBULATORY_CARE_PROVIDER_SITE_OTHER): Payer: Medicare Other

## 2017-11-14 DIAGNOSIS — Z23 Encounter for immunization: Secondary | ICD-10-CM

## 2017-12-02 ENCOUNTER — Encounter: Payer: Self-pay | Admitting: Obstetrics & Gynecology

## 2017-12-02 ENCOUNTER — Other Ambulatory Visit: Payer: Self-pay

## 2017-12-02 ENCOUNTER — Ambulatory Visit: Payer: Medicare Other | Admitting: Obstetrics & Gynecology

## 2017-12-02 VITALS — BP 130/70 | HR 72 | Resp 16 | Ht 62.0 in | Wt 118.2 lb

## 2017-12-02 DIAGNOSIS — N939 Abnormal uterine and vaginal bleeding, unspecified: Secondary | ICD-10-CM | POA: Diagnosis not present

## 2017-12-02 DIAGNOSIS — N8111 Cystocele, midline: Secondary | ICD-10-CM | POA: Diagnosis not present

## 2017-12-02 DIAGNOSIS — N765 Ulceration of vagina: Secondary | ICD-10-CM | POA: Diagnosis not present

## 2017-12-02 DIAGNOSIS — N898 Other specified noninflammatory disorders of vagina: Secondary | ICD-10-CM | POA: Diagnosis not present

## 2017-12-02 MED ORDER — METRONIDAZOLE 0.75 % VA GEL: 1.0000 | Freq: Every day | VAGINAL | 0 refills | Status: DC

## 2017-12-02 NOTE — Progress Notes (Signed)
GYNECOLOGY  VISIT  CC:   Pessary check  HPI: 77 y.o. G70P1001 [name] here for follow up pessary.  Reports on Wednesday evening with voiding, she saw some bright red bleeding.  Reports she put on a pad but never saw any additional bleeding.  Denies cramping, diarrhea or constipation.  She is having some increased vaginal odor.  Denies fever as well.  Worried about ovarian cancer.  Denies pelvic pain or pressure.  Has some constipation that is not a new issue.  Denies urinary issues.  GYNECOLOGIC HISTORY: Patient's last menstrual period was 02/16/1988 (approximate). Contraception: post menoapusal  Menopausal hormone therapy: none  Patient Active Problem List   Diagnosis Date Noted  . Intermittent palpitations 01/17/2017  . Right shoulder pain 12/14/2016  . Vertigo 04/02/2016  . Right carotid bruit 08/01/2015  . Uterine prolapse 06/13/2013  . Cystocele 06/13/2013  . Ex-cigarette smoker 05/04/2013  . Asthmatic bronchitis 05/04/2013  . 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 ESOPHAGEAL REFLUX 03/24/2010  . 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 HYPERCHOLESTEROLEMIA, BORDERLINE, WITH HIGH HDL 01/18/2008  . Disorder of bone and cartilage 01/18/2008  . COLONIC POLYPS 01/17/2008  . Hypothyroidism 01/17/2008  . Anxiety state 01/17/2008  . History of cardiovascular disorder 01/17/2008    Past Medical History:  Diagnosis Date  . COPD (chronic obstructive pulmonary disease) ([place])   . Hx of colonic polyps   . Hypercholesterolemia   . Hypothyroidism   . 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 MVP (mitral valve prolapse)   . Osteopenia   . Prolapsed uterus   . Varicose vein     Past Surgical History:  Procedure Laterality Date  . 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 Patient does not remember exact date of surgery.  [name] CATARACT EXTRACTION Right 10/25/2017  . [place]  . MELANOMA EXCISION  12/31/2010   Procedure: MELANOMA EXCISION;   Surgeon: Zenovia Jarred, MD;  Location: [facility];  Service: General;  Laterality: Left;  wide excision melanoma left thigh, excision lesion left thigh  . OTHER SURGICAL HISTORY Right    Surgical repair of architectual distortion Right Breast- Hyperplasia  . TONSILLECTOMY      MEDS:   Current Outpatient Medications on File Prior to Visit  Medication Sig Dispense Refill  . acyclovir ointment (ZOVIRAX) 5 % See admin instructions. Prn cold sores  0  . aspirin 81 MG tablet Take 81 mg by mouth daily.      . Calcium Carbonate-Vitamin D (CALCIUM + D PO) Take by mouth daily.      . Cholecalciferol (VITAMIN D) 2000 UNITS CAPS Take 1 capsule by mouth daily.    . cimetidine (TAGAMET) 400 MG tablet TAKE 1 TABLET BY MOUTH TWICE A DAY (Patient taking differently: prn) 60 tablet 0  . DUREZOL 0.05 % EMUL STARTING 2 DAYS BEFORE SURGERY, PLACE 1 DROP INTO RIGHT EYE 4 TIMES A DAY  0  . fluticasone (FLONASE) 50 MCG/ACT nasal spray PLACE 2 SPRAYS INTO BOTH NOSTRILS AS NEEDED. 16 g 2  . hydrocortisone (ANUSOL-HC) 2.5 % rectal cream Place 1 application rectally 4 (four) times daily. 30 g 1  . levothyroxine (SYNTHROID, LEVOTHROID) 88 MCG tablet Take 1 tablet (88 mcg total) by mouth daily before breakfast. 30 tablet 11  . LUTEIN PO Take 1 tablet by mouth daily.    . Multiple Vitamin (MULTIVITAMIN) tablet Take 1 tablet by mouth daily.      [name]  nicotine polacrilex (NICORETTE) 2 MG gum Take 2 mg by mouth as needed. Not smoking    . NONFORMULARY OR COMPOUNDED ITEM Vaginal estradiol cream 0.02%.  Place 1/2 gram per vagina at bedtime every other night for 10 days, then place 1/2 gram per vaginal at bedtime twice weekly. 8 each 10   No current facility-administered medications on file prior to visit.     ALLERGIES: Codeine; Ciprofloxacin; and Sulfur  Family History  Problem Relation Age of Onset  . Hypertension Mother   . Congestive Heart Failure Mother   . Thyroid disease Mother   . Stroke Father    . Heart attack Father 65  . Diabetes Paternal Grandmother   . Diabetes Paternal Grandfather   . Hypertension Maternal Grandfather   . CVA Maternal Grandfather   . Esophageal cancer Neg Hx   . Rectal cancer Neg Hx   . Stomach cancer Neg Hx   . Colon cancer Neg Hx     SH:  Widowed, non smoker  Review of Systems  All other systems reviewed and are negative.   PHYSICAL EXAMINATION:    BP 130/70 (BP Location: Right Arm, Patient Position: Sitting, Cuff Size: Normal)   Pulse 72   Resp 16   Ht 5\' 2"  (1.575 m)   Wt 118 lb 3.2 oz (53.6 kg)   LMP 02/16/1988 (Approximate)   BMI 21.62 kg/m     General appearance: alert, cooperative and appears stated age Abdomen: soft, non-tender; bowel sounds normal; no masses,  no organomegaly Lymph:  no inguinal LAD noted  Pelvic: External genitalia:  no lesions              Urethra:  normal appearing urethra with no masses, tenderness or lesions              Bartholins and Skenes: normal                 Vagina: normal appearing vagina with normal color and discharge, posterior ulceration beneath cervix present on exam today              Cervix: no lesions              Bimanual Exam:  Uterus:  normal size, contour, position, consistency, mobility, non-tender              Adnexa: no mass, fullness, tenderness  Pessary removed and cleansed and left out.  Topical estrogen applied to vaginal ulceration.  Chaperone was present for exam.  Assessment: Vaginal ulceration Pessary use Cystocele, midline vaginal odor  Plan: Pt will leave pessary out for two weeks and return for follow up in 2 weeks.  Metrogel 6.27% one applicator full nightly x 5 nights given

## 2017-12-08 ENCOUNTER — Other Ambulatory Visit: Payer: Self-pay | Admitting: Adult Health

## 2017-12-09 NOTE — Telephone Encounter (Signed)
Patient is calling and wanted to speak to someone about getting a refill for Levothyroxine 88 mcg sent to [facility] on [address]. CB is [phone]

## 2017-12-14 ENCOUNTER — Ambulatory Visit: Payer: Medicare Other | Admitting: Pulmonary Disease

## 2017-12-14 ENCOUNTER — Telehealth: Payer: Self-pay | Admitting: Pulmonary Disease

## 2017-12-14 ENCOUNTER — Encounter: Payer: Self-pay | Admitting: Pulmonary Disease

## 2017-12-14 VITALS — BP 126/64 | HR 65 | Temp 97.5°F | Ht 62.0 in | Wt 118.2 lb

## 2017-12-14 DIAGNOSIS — E7800 Pure hypercholesterolemia, unspecified: Secondary | ICD-10-CM

## 2017-12-14 DIAGNOSIS — Z87891 Personal history of nicotine dependence: Secondary | ICD-10-CM

## 2017-12-14 DIAGNOSIS — M949 Disorder of cartilage, unspecified: Secondary | ICD-10-CM

## 2017-12-14 DIAGNOSIS — J4520 Mild intermittent asthma, uncomplicated: Secondary | ICD-10-CM

## 2017-12-14 DIAGNOSIS — K219 Gastro-esophageal reflux disease without esophagitis: Secondary | ICD-10-CM

## 2017-12-14 DIAGNOSIS — M899 Disorder of bone, unspecified: Secondary | ICD-10-CM

## 2017-12-14 DIAGNOSIS — C4372 Malignant melanoma of left lower limb, including hip: Secondary | ICD-10-CM

## 2017-12-14 DIAGNOSIS — D126 Benign neoplasm of colon, unspecified: Secondary | ICD-10-CM

## 2017-12-14 DIAGNOSIS — E039 Hypothyroidism, unspecified: Secondary | ICD-10-CM

## 2017-12-14 DIAGNOSIS — R002 Palpitations: Secondary | ICD-10-CM

## 2017-12-14 DIAGNOSIS — N814 Uterovaginal prolapse, unspecified: Secondary | ICD-10-CM

## 2017-12-14 DIAGNOSIS — F411 Generalized anxiety disorder: Secondary | ICD-10-CM

## 2017-12-14 MED ORDER — FLUTICASONE PROPIONATE 50 MCG/ACT NA SUSP: NASAL | 5 refills | Status: AC

## 2017-12-14 MED ORDER — LEVOTHYROXINE SODIUM 88 MCG PO TABS: 88.0000 ug | ORAL_TABLET | Freq: Every day | ORAL | 11 refills | Status: AC

## 2017-12-14 MED ORDER — CIMETIDINE 400 MG PO TABS: 400.0000 mg | ORAL_TABLET | ORAL | 6 refills | Status: DC | PRN

## 2017-12-14 NOTE — Patient Instructions (Addendum)
Today we updated your med list in our EPIC system...    Continue your current medications the same...  We refilled your meds per request...  Keep up the good work w/ diet & exercise!!!  We discussed my up-coming reitirement & rec for a primary care doctor at [facility] or [facility]...  Evey,  It has been my great honor to have been your doctor (and Joylene Draft) over these many years...    Sending you my very best wishes for continued good health & much happiness in the years to come!!!

## 2017-12-14 NOTE — Telephone Encounter (Signed)
Spoke with the pt  She states needing [place] to send her records to Dr Orma Flaming- her new PCP at [facility]  I advised that all of her records are already in [place] and we should not have to send anything  She was told by the person who made her appt that this is incorrect  I called [facility]  Spoke with Donetta Potts  She states that there is no need for [place] to send the records  Pt aware  Nothing further needed

## 2017-12-16 ENCOUNTER — Other Ambulatory Visit: Payer: Self-pay

## 2017-12-16 ENCOUNTER — Ambulatory Visit: Payer: Medicare Other | Admitting: Obstetrics & Gynecology

## 2017-12-16 ENCOUNTER — Encounter: Payer: Self-pay | Admitting: Obstetrics & Gynecology

## 2017-12-16 VITALS — BP 140/60 | HR 68 | Resp 16 | Ht 62.0 in | Wt 118.4 lb

## 2017-12-16 DIAGNOSIS — N814 Uterovaginal prolapse, unspecified: Secondary | ICD-10-CM | POA: Diagnosis not present

## 2017-12-16 DIAGNOSIS — N765 Ulceration of vagina: Secondary | ICD-10-CM | POA: Diagnosis not present

## 2017-12-16 NOTE — Progress Notes (Signed)
GYNECOLOGY  VISIT  CC:   Vaginal recheck prior to replacing pessary  HPI: 77 y.o. G19P1001 [name] here to recheck vaginal ulceration prior to replacing pessary.  Reports she did finish the metrogel.  Odor and discharge are gone.  Bladder does prolapse down but she just pushes it back inside the vagina.  [place] [employer] and this was bothersome but dealt with it just fine.    GYNECOLOGIC HISTORY: Patient's last menstrual period was 02/16/1988 (approximate). Contraception: post menopausal  Menopausal hormone therapy: none  Patient Active Problem List   Diagnosis Date Noted  . Intermittent palpitations 01/17/2017  . Right shoulder pain 12/14/2016  . Vertigo 04/02/2016  . Right carotid bruit 08/01/2015  . Uterine prolapse 06/13/2013  . Cystocele 06/13/2013  . Ex-cigarette smoker 05/04/2013  . Asthmatic bronchitis 05/04/2013  . 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 ESOPHAGEAL REFLUX 03/24/2010  . 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 HYPERCHOLESTEROLEMIA, BORDERLINE, WITH HIGH HDL 01/18/2008  . Disorder of bone and cartilage 01/18/2008  . COLONIC POLYPS 01/17/2008  . Hypothyroidism 01/17/2008  . Anxiety state 01/17/2008  . History of cardiovascular disorder 01/17/2008    Past Medical History:  Diagnosis Date  . COPD (chronic obstructive pulmonary disease) ([place])   . Hx of colonic polyps   . Hypercholesterolemia   . Hypothyroidism   . 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 MVP (mitral valve prolapse)   . Osteopenia   . Prolapsed uterus   . Varicose vein     Past Surgical History:  Procedure Laterality Date  . 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 Patient does not remember exact date of surgery.  [name] CATARACT EXTRACTION Right 10/25/2017  . [place]  . MELANOMA EXCISION  12/31/2010   Procedure: MELANOMA EXCISION;  Surgeon: Zenovia Jarred, MD;  Location: [facility];  Service: General;  Laterality:  Left;  wide excision melanoma left thigh, excision lesion left thigh  . OTHER SURGICAL HISTORY Right    Surgical repair of architectual distortion Right Breast- Hyperplasia  . TONSILLECTOMY      MEDS:   Current Outpatient Medications on File Prior to Visit  Medication Sig Dispense Refill  . acyclovir ointment (ZOVIRAX) 5 % See admin instructions. Prn cold sores  0  . aspirin 81 MG tablet Take 81 mg by mouth daily.      . Calcium Carbonate-Vitamin D (CALCIUM + D PO) Take by mouth daily.      . Cholecalciferol (VITAMIN D) 2000 UNITS CAPS Take 1 capsule by mouth daily.    . cimetidine (TAGAMET) 400 MG tablet Take 1 tablet (400 mg total) by mouth as needed. 30 tablet 6  . DUREZOL 0.05 % EMUL 3 drops left eye and 1 drop in right eye  0  . fluticasone (FLONASE) 50 MCG/ACT nasal spray PLACE 2 SPRAYS INTO BOTH NOSTRILS AS NEEDED. 16 g 5  . hydrocortisone (ANUSOL-HC) 2.5 % rectal cream Place 1 application rectally 4 (four) times daily. 30 g 1  . levothyroxine (SYNTHROID, LEVOTHROID) 88 MCG tablet Take 1 tablet (88 mcg total) by mouth daily before breakfast. 30 tablet 11  . LUTEIN PO Take 1 tablet by mouth daily.    . Multiple Vitamin (MULTIVITAMIN) tablet Take 1 tablet by mouth daily.      . nicotine polacrilex (NICORETTE) 2 MG gum Take 2 mg by mouth as needed. Not smoking    . NONFORMULARY OR COMPOUNDED ITEM Vaginal  estradiol cream 0.02%.  Place 1/2 gram per vagina at bedtime every other night for 10 days, then place 1/2 gram per vaginal at bedtime twice weekly. 8 each 10   No current facility-administered medications on file prior to visit.     ALLERGIES: Codeine; Ciprofloxacin; and Sulfur  Family History  Problem Relation Age of Onset  . Hypertension Mother   . Congestive Heart Failure Mother   . Thyroid disease Mother   . Stroke Father   . Heart attack Father 62  . Diabetes Paternal Grandmother   . Diabetes Paternal Grandfather   . Hypertension Maternal Grandfather   . CVA Maternal  Grandfather   . Esophageal cancer Neg Hx   . Rectal cancer Neg Hx   . Stomach cancer Neg Hx   . Colon cancer Neg Hx     SH:  Widowed, non smoker  Review of Systems  All other systems reviewed and are negative.   PHYSICAL EXAMINATION:    BP 140/60 (BP Location: Left Arm, Patient Position: Sitting, Cuff Size: Normal)   Pulse 68   Resp 16   Ht 5\' 2"  (1.575 m)   Wt 118 lb 6.4 oz (53.7 kg)   LMP 02/16/1988 (Approximate)   BMI 21.66 kg/m     General appearance: alert, cooperative and appears stated age Lymph:  no inguinal LAD noted  Pelvic: External genitalia:  no lesions              Urethra:  normal appearing urethra with no masses, tenderness or lesions              Bartholins and Skenes: normal                 Vagina: normal appearing vagina with normal color and discharge, there is a much smaller ulceration area present that measures about 0.5 x 2cm.  This is beneath the cervix              Cervix: no lesions              Bimanual Exam:  Uterus:  normal size, contour, position, consistency, mobility, non-tender              Adnexa: no mass, fullness, tenderness  Chaperone was present for exam.  Assessment: Vaginal ulceration from pessary use  Plan: Vaginal estrogen applied.  D/w pt using twice weekly.  She is going to try and place in applicator and apply.  Recheck 2 weeks.

## 2017-12-21 ENCOUNTER — Other Ambulatory Visit: Payer: Self-pay | Admitting: Pulmonary Disease

## 2017-12-21 ENCOUNTER — Telehealth: Payer: Self-pay | Admitting: Pulmonary Disease

## 2017-12-21 MED ORDER — FAMOTIDINE 40 MG PO TABS: 40.0000 mg | ORAL_TABLET | Freq: Every day | ORAL | 6 refills | Status: DC

## 2017-12-21 NOTE — Telephone Encounter (Signed)
Received fax from [facility] that Cimetidine 400mg , was on back order.  Per SN- ok to change to Pepcid 40mg , take 1 daily, #30, with refills.  Patient notified of change.  New prescription sent to pharmacy.  Nothing further at this time.

## 2017-12-30 ENCOUNTER — Ambulatory Visit: Payer: Medicare Other | Admitting: Obstetrics & Gynecology

## 2018-01-02 ENCOUNTER — Encounter: Payer: Self-pay | Admitting: Obstetrics and Gynecology

## 2018-01-03 ENCOUNTER — Encounter: Payer: Self-pay | Admitting: Obstetrics & Gynecology

## 2018-01-03 ENCOUNTER — Other Ambulatory Visit: Payer: Self-pay

## 2018-01-03 ENCOUNTER — Ambulatory Visit: Payer: Medicare Other | Admitting: Obstetrics & Gynecology

## 2018-01-03 VITALS — BP 138/60 | HR 84 | Resp 16 | Ht 62.0 in | Wt 119.0 lb

## 2018-01-03 DIAGNOSIS — B002 Herpesviral gingivostomatitis and pharyngotonsillitis: Secondary | ICD-10-CM

## 2018-01-03 DIAGNOSIS — N8111 Cystocele, midline: Secondary | ICD-10-CM

## 2018-01-03 MED ORDER — NONFORMULARY OR COMPOUNDED ITEM: 4 refills | Status: DC

## 2018-01-03 MED ORDER — HYDROCORTISONE 2.5 % RE CREA: 1.0000 "application " | TOPICAL_CREAM | Freq: Four times a day (QID) | RECTAL | 1 refills | Status: DC

## 2018-01-03 MED ORDER — VALACYCLOVIR HCL 1 G PO TABS: ORAL_TABLET | ORAL | 2 refills | Status: AC

## 2018-01-03 NOTE — Progress Notes (Signed)
GYNECOLOGY  VISIT  CC:   Pessary check  HPI: 77 y.o. G46P1001 [name] here for pessary check.  Pessary has been out about 5 weeks due to ulceration at top of vagina.  Denies vaginal odor or discharge today.  States she doesn't want to come back over the holidays if there is still a non-healed place in the vagina.  Has experienced a couple of episodes of increased urgency and mild dysuria where she thought she was developing a UTI but then the symptoms resolved with taking AZO.  Denies any vaginal bleeding as well.  Bladder is prolapsed out at times and she does feel like she has incomplete emptying at times as well.  She has a second question for me today:  Has hx of oral HSV.  Using acyclovir ointment topically but doesn't think it helps much.  Would like another suggestion.  Lastly, has intermittent hemorrhoid issues.  Would like rx for topical cream.   GYNECOLOGIC HISTORY: Patient's last menstrual period was 02/16/1988 (approximate). Contraception: PMP Menopausal hormone therapy: none  Patient Active Problem List   Diagnosis Date Noted  . Intermittent palpitations 01/17/2017  . Right shoulder pain 12/14/2016  . Vertigo 04/02/2016  . Right carotid bruit 08/01/2015  . Uterine prolapse 06/13/2013  . Cystocele 06/13/2013  . Ex-cigarette smoker 05/04/2013  . Asthmatic bronchitis 05/04/2013  . 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 ESOPHAGEAL REFLUX 03/24/2010  . 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 HYPERCHOLESTEROLEMIA, BORDERLINE, WITH HIGH HDL 01/18/2008  . Disorder of bone and cartilage 01/18/2008  . COLONIC POLYPS 01/17/2008  . Hypothyroidism 01/17/2008  . Anxiety state 01/17/2008  . History of cardiovascular disorder 01/17/2008    Past Medical History:  Diagnosis Date  . COPD (chronic obstructive pulmonary disease) ([place])   . Hx of colonic polyps   . Hypercholesterolemia   . Hypothyroidism   . 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 MVP (mitral valve  prolapse)   . Osteopenia   . Prolapsed uterus   . Varicose vein     Past Surgical History:  Procedure Laterality Date  . 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 Patient does not remember exact date of surgery.  [name] CATARACT EXTRACTION Right 10/25/2017  . [place]  . MELANOMA EXCISION  12/31/2010   Procedure: MELANOMA EXCISION;  Surgeon: Zenovia Jarred, MD;  Location: [facility];  Service: General;  Laterality: Left;  wide excision melanoma left thigh, excision lesion left thigh  . OTHER SURGICAL HISTORY Right    Surgical repair of architectual distortion Right Breast- Hyperplasia  . TONSILLECTOMY      MEDS:   Current Outpatient Medications on File Prior to Visit  Medication Sig Dispense Refill  . acyclovir ointment (ZOVIRAX) 5 % See admin instructions. Prn cold sores  0  . aspirin 81 MG tablet Take 81 mg by mouth daily.      . Calcium Carbonate-Vitamin D (CALCIUM + D PO) Take by mouth daily.      . Cholecalciferol (VITAMIN D) 2000 UNITS CAPS Take 1 capsule by mouth daily.    . DUREZOL 0.05 % EMUL 3 drops left eye and 1 drop in right eye  0  . famotidine (PEPCID) 40 MG tablet Take 1 tablet (40 mg total) by mouth daily. 30 tablet 6  . fluticasone (FLONASE) 50 MCG/ACT nasal spray PLACE 2 SPRAYS INTO BOTH NOSTRILS AS NEEDED. 16 g 5  . hydrocortisone (ANUSOL-HC) 2.5 % rectal  cream Place 1 application rectally 4 (four) times daily. 30 g 1  . levothyroxine (SYNTHROID, LEVOTHROID) 88 MCG tablet Take 1 tablet (88 mcg total) by mouth daily before breakfast. 30 tablet 11  . LUTEIN PO Take 1 tablet by mouth daily.    . Multiple Vitamin (MULTIVITAMIN) tablet Take 1 tablet by mouth daily.      . nicotine polacrilex (NICORETTE) 2 MG gum Take 2 mg by mouth as needed. Not smoking    . NONFORMULARY OR COMPOUNDED ITEM Vaginal estradiol cream 0.02%.  Place 1/2 gram per vagina at bedtime every other night for 10 days, then place 1/2 gram per vaginal at  bedtime twice weekly. 8 each 10   No current facility-administered medications on file prior to visit.     ALLERGIES: Codeine; Ciprofloxacin; and Sulfur  Family History  Problem Relation Age of Onset  . Hypertension Mother   . Congestive Heart Failure Mother   . Thyroid disease Mother   . Stroke Father   . Heart attack Father 72  . Diabetes Paternal Grandmother   . Diabetes Paternal Grandfather   . Hypertension Maternal Grandfather   . CVA Maternal Grandfather   . Esophageal cancer Neg Hx   . Rectal cancer Neg Hx   . Stomach cancer Neg Hx   . Colon cancer Neg Hx     SH:  Widowed, non smoker  Review of Systems  All other systems reviewed and are negative.   PHYSICAL EXAMINATION:    BP 138/60 (BP Location: Right Arm, Patient Position: Sitting, Cuff Size: Normal)   Pulse 84   Resp 16   Ht 5\' 2"  (1.575 m)   Wt 119 lb (54 kg)   LMP 02/16/1988 (Approximate)   BMI 21.77 kg/m     General appearance: alert, cooperative and appears stated age Abdomen: soft, non-tender; bowel sounds normal; no masses,  no organomegaly Lymph:  no inguinal LAD noted  Pelvic: External genitalia:  no lesions              Urethra:  normal appearing urethra with no masses, tenderness or lesions              Bartholins and Skenes: normal                 Vagina: normal appearing vagina with normal color and discharge, no vaginal lesions noted              Cervix: no lesions              Bimanual Exam:  Uterus:  normal size, contour, position, consistency, mobility, non-tender              Adnexa: no mass, fullness, tenderness  Pessary easily replaced.  Vaginal estrogen cream applied to top of vagina and pessary before replacing it.  Pt tolerated this well.  Chaperone was present for exam.  Assessment: Cystocele with pessary use H/o oral HSV Hemorrhoids   Plan: Trial of valtrex 1gm, 2 tabs po x 1, repeat 12 hours for oral HSV symptoms.  rx sent to pharmacy. Anusol HC topically 4 times daily  as needed for hemorrhoids RF for estradiol 0.02% 1/2 gram pv twice weekly.  #8 grams Return for recheck 1 month.   ~15 minutes spent with patient >50% of time was in face to face discussion of above.

## 2018-01-05 LAB — URINE CULTURE

## 2018-01-16 ENCOUNTER — Ambulatory Visit: Payer: Medicare Other | Admitting: Family Medicine

## 2018-01-16 ENCOUNTER — Encounter: Payer: Self-pay | Admitting: Family Medicine

## 2018-01-16 VITALS — BP 132/70 | HR 70 | Temp 97.8°F | Ht 62.0 in | Wt 118.6 lb

## 2018-01-16 DIAGNOSIS — E039 Hypothyroidism, unspecified: Secondary | ICD-10-CM

## 2018-01-16 DIAGNOSIS — R0989 Other specified symptoms and signs involving the circulatory and respiratory systems: Secondary | ICD-10-CM | POA: Diagnosis not present

## 2018-01-16 NOTE — Progress Notes (Signed)
Patient: Ariel Holmes MRN: [id number] DOB: [birth year] PCP: Orma Flaming, MD     Subjective:  Chief Complaint  Patient presents with  . Establish Care    HPI: The patient is a 77 y.o. female who presents today to establish care. She has hx of hypothyroidism, melanoma of right thigh in 2012 and is followed by derm, uterine prolapse with pessary, GERD, bronchitis and varicose veins. She is up to date on her labs/health maintenance. Does not need refills of any of her medication.   Review of Systems  Constitutional: Negative for fatigue.  Respiratory: Negative for shortness of breath.   Cardiovascular: Negative for chest pain.  Gastrointestinal: Negative for abdominal pain, constipation, diarrhea and nausea.  Musculoskeletal: Positive for arthralgias. Negative for back pain and neck pain.       Right shoulder pain.  Followed by ortho for this  Skin: Negative.   Neurological: Negative for dizziness and headaches.  Psychiatric/Behavioral: Negative for sleep disturbance. The patient is not nervous/anxious.     Allergies Patient is allergic to codeine; ciprofloxacin; and sulfur.  Past Medical History Patient  has a past medical history of Colon polyps, COPD (chronic obstructive pulmonary disease) ([place]), History of chicken pox, colonic polyps, Hypercholesterolemia, Hypothyroidism, Melanoma of thigh ([place]) (02/15/2011), MVP (mitral valve prolapse), Osteopenia, Prolapsed uterus, and Varicose vein.  Surgical History Patient  has a past surgical history that includes Breast surgery; Tonsillectomy; Cesarean section (1965); Melanoma excision (12/31/2010); Other surgical history (Right); and Cataract extraction (Right, 10/25/2017).  Family History Pateint's family history includes CVA in her maternal grandfather; Congestive Heart Failure in her mother; Diabetes in her paternal grandfather and paternal grandmother; Heart attack (age of onset: 10) in her father; Heart disease in her mother;  Hyperlipidemia in her mother; Hypertension in her maternal grandfather and mother; Stroke in her father; Thyroid disease in her mother.  Social History Patient  reports that she quit smoking about 7 years ago. Her smoking use included cigarettes. She started smoking about 57 years ago. She has a 50.00 pack-year smoking history. She has never used smokeless tobacco. She reports that she does not drink alcohol or use drugs.    Objective: Vitals:   01/16/18 1055  BP: 132/70  Pulse: 70  Temp: 97.8 F (36.6 C)  TempSrc: Oral  SpO2: 95%  Weight: 118 lb 9.6 oz (53.8 kg)  Height: 5\' 2"  (1.575 m)    Body mass index is 21.69 kg/m.  Physical Exam  Constitutional: She is oriented to person, place, and time. She appears well-developed and well-nourished.  HENT:  Right Ear: External ear normal.  Left Ear: External ear normal.  Mouth/Throat: Oropharynx is clear and moist.  Eyes: Pupils are equal, round, and reactive to light. Conjunctivae and EOM are normal.  Neck: Normal range of motion. Neck supple. No thyromegaly present.  Cardiovascular: Normal rate, regular rhythm, normal heart sounds and intact distal pulses.  No murmur heard. Pulmonary/Chest: Effort normal and breath sounds normal.  Abdominal: Soft. Bowel sounds are normal. She exhibits no distension. There is no tenderness.  Lymphadenopathy:    She has no cervical adenopathy.  Neurological: She is alert and oriented to person, place, and time. She displays normal reflexes. No cranial nerve deficit. Coordination normal.  Skin: Skin is warm and dry. No rash noted.  Psychiatric: She has a normal mood and affect. Her behavior is normal.  Vitals reviewed.  Depression screen [facility] 2/9 01/16/2018 05/04/2013 04/28/2012  Decreased Interest 0 0 0  Down, Depressed, Hopeless 0 0  0  PHQ - 2 Score 0 0 0       Assessment/plan: 1. Acquired hypothyroidism No refills needed. Not due for labs until April. Will f//u then.   2. Right carotid  bruit Stable ultrasounds. No follow up needed. She is on daily ASA, discussed risks vs. Benefits and what studies have shown with this. She already uses ibuprofen for her shoulder. Her ASCVD risk is 21.4%. I personally would stop the ASA as her bleeding risk is greater and is already on another NSAID. She will think about this.   -reviewed and updated her problem list with her.   Return in about 4 months (around 05/18/2018) for annual .   Orma Flaming, MD [place]   01/16/2018

## 2018-02-10 ENCOUNTER — Telehealth: Payer: Self-pay | Admitting: Family Medicine

## 2018-02-10 ENCOUNTER — Encounter: Payer: Self-pay | Admitting: Obstetrics & Gynecology

## 2018-02-10 ENCOUNTER — Ambulatory Visit: Payer: Medicare Other | Admitting: Obstetrics & Gynecology

## 2018-02-10 VITALS — BP 118/60 | HR 66 | Resp 14 | Ht 62.0 in | Wt 116.6 lb

## 2018-02-10 DIAGNOSIS — N8111 Cystocele, midline: Secondary | ICD-10-CM

## 2018-02-10 NOTE — Patient Instructions (Signed)
Ariel Holmes Address: [address] [place], [place], [place] [address]  Phone: [phone]

## 2018-02-10 NOTE — Telephone Encounter (Signed)
Copied from [place] [id number]. Topic: Appointment Scheduling - Transfer of Care >> Feb 10, 2018  4:39 PM Reyne Dumas L wrote: Pt is requesting to transfer FROM: Dr. Rogers Blocker Pt is requesting to transfer TO: Dr. [place] Reason for requested transfer: Dr. Shelby Mattocks office is too far away from her home  Pt can be reached at [phone]  Send CRM to patient's current PCP (transferring FROM).

## 2018-02-10 NOTE — Progress Notes (Signed)
GYNECOLOGY  VISIT  CC:   Pessary recheck  HPI: 77 y.o. G56P1001 [name] here for a pessary check.  Denies vaginal bleeding.  Denies vaginal discharge.  Denies vaginal odor.  Dr. Leeanne Deed is retiring and she would like ideas for new PCPs.    GYNECOLOGIC HISTORY: Patient's last menstrual period was 02/16/1988 (lmp unknown). Contraception: NA Menopausal hormone therapy: none  Patient Active Problem List   Diagnosis Date Noted  . Intermittent palpitations 01/17/2017  . Right shoulder pain 12/14/2016  . Right carotid bruit 08/01/2015  . Uterine prolapse 06/13/2013  . Cystocele 06/13/2013  . Ex-cigarette smoker 05/04/2013  . Asthmatic bronchitis 05/04/2013  . 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 ESOPHAGEAL REFLUX 03/24/2010  . 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 COLONIC POLYPS 01/17/2008  . Hypothyroidism 01/17/2008    Past Medical History:  Diagnosis Date  . Colon polyps   . COPD (chronic obstructive pulmonary disease) ([place])   . History of chicken pox   . Hx of colonic polyps   . Hypercholesterolemia   . Hypothyroidism   . 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 MVP (mitral valve prolapse)   . Osteopenia   . Prolapsed uterus   . Varicose vein     Past Surgical History:  Procedure Laterality Date  . 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 Patient does not remember exact date of surgery.  [name] CATARACT EXTRACTION Right 10/25/2017  . [place]  . MELANOMA EXCISION  12/31/2010   Procedure: MELANOMA EXCISION;  Surgeon: Zenovia Jarred, MD;  Location: [facility];  Service: General;  Laterality: Left;  wide excision melanoma left thigh, excision lesion left thigh  . OTHER SURGICAL HISTORY Right    Surgical repair of architectual distortion Right Breast- Hyperplasia  . TONSILLECTOMY      MEDS:   Current Outpatient Medications on File Prior to Visit  Medication Sig Dispense Refill  . acyclovir ointment (ZOVIRAX)  5 % See admin instructions. Prn cold sores  0  . aspirin 81 MG tablet Take 81 mg by mouth daily.      . Calcium Carbonate-Vitamin D (CALCIUM + D PO) Take by mouth daily.      . Cholecalciferol (VITAMIN D) 2000 UNITS CAPS Take 1 capsule by mouth daily.    . Cranberry 600 MG TABS Take by mouth.    . famotidine (PEPCID) 40 MG tablet Take 1 tablet (40 mg total) by mouth daily. (Patient not taking: Reported on 01/16/2018) 30 tablet 6  . fluticasone (FLONASE) 50 MCG/ACT nasal spray PLACE 2 SPRAYS INTO BOTH NOSTRILS AS NEEDED. 16 g 5  . hydrocortisone (ANUSOL-HC) 2.5 % rectal cream Place 1 application rectally 4 (four) times daily. 30 g 1  . levothyroxine (SYNTHROID, LEVOTHROID) 88 MCG tablet Take 1 tablet (88 mcg total) by mouth daily before breakfast. 30 tablet 11  . LUTEIN PO Take 1 tablet by mouth daily.    . Multiple Vitamin (MULTIVITAMIN) tablet Take 1 tablet by mouth daily.      . Multiple Vitamins-Minerals (EYE-VITE PLUS LUTEIN PO) Take by mouth.    . Multiple Vitamins-Minerals (PRESERVISION AREDS 2 PO) Take by mouth.    . nicotine polacrilex (NICORETTE) 2 MG gum Take 2 mg by mouth as needed. Not smoking    . NONFORMULARY OR COMPOUNDED ITEM Vaginal estradiol cream 0.02%.  1/2 gram per vaginal at bedtime twice weekly. 8 each 4  . valACYclovir (VALTREX) 1000 MG tablet  2 tabs (2gm) x 1, repeat in 12 hours with symptom onset 30 tablet 2   No current facility-administered medications on file prior to visit.     ALLERGIES: Codeine; Ciprofloxacin; and Sulfur  Family History  Problem Relation Age of Onset  . Hypertension Mother   . Congestive Heart Failure Mother   . Thyroid disease Mother   . Hyperlipidemia Mother   . Heart disease Mother   . Stroke Father   . Heart attack Father 73  . Diabetes Paternal Grandmother   . Diabetes Paternal Grandfather   . Hypertension Maternal Grandfather   . CVA Maternal Grandfather   . Esophageal cancer Neg Hx   . Rectal cancer Neg Hx   . Stomach cancer  Neg Hx   . Colon cancer Neg Hx     SH:  Widowed, non smoker  Review of Systems  All other systems reviewed and are negative.   PHYSICAL EXAMINATION:    BP 118/60   Pulse 66   Resp 14   Ht 5\' 2"  (1.575 m)   Wt 116 lb 9.6 oz (52.9 kg)   LMP 02/16/1988 (LMP Unknown)   BMI 21.33 kg/m     General appearance: alert, cooperative and appears stated age Lymph:  no inguinal LAD noted  Pelvic: External genitalia:  no lesions              Urethra:  normal appearing urethra with no masses, tenderness or lesions              Bartholins and Skenes: normal                 Vagina: normal appearing vagina with normal color and discharge, no lesions              Cervix: no lesions              Bimanual Exam:  Uterus:  normal size, contour, position, consistency, mobility, non-tender              Adnexa: no mass, fullness, tenderness   Pessary removed and cleansed.  No vaginal lesions noted.  Chaperone was present for exam.  Assessment: Cystocele with pessary use  Plan: Recheck 1 month.

## 2018-02-13 NOTE — Telephone Encounter (Signed)
Fine with me. Thanks, BJ 

## 2018-03-09 ENCOUNTER — Ambulatory Visit: Payer: Medicare Other | Admitting: Obstetrics & Gynecology

## 2018-03-09 ENCOUNTER — Encounter: Payer: Self-pay | Admitting: Obstetrics & Gynecology

## 2018-03-09 ENCOUNTER — Telehealth: Payer: Self-pay | Admitting: Obstetrics & Gynecology

## 2018-03-09 VITALS — BP 154/76 | HR 64 | Resp 16 | Ht 62.0 in | Wt 117.0 lb

## 2018-03-09 DIAGNOSIS — N8111 Cystocele, midline: Secondary | ICD-10-CM

## 2018-03-09 DIAGNOSIS — Z4689 Encounter for fitting and adjustment of other specified devices: Secondary | ICD-10-CM

## 2018-03-09 NOTE — Telephone Encounter (Signed)
Ariel Holmes called her and she came back today.  No letter or fee needed.  Ok to close encounter.

## 2018-03-09 NOTE — Progress Notes (Signed)
GYNECOLOGY  VISIT  CC:   Pessary check  HPI: 78 y.o. G81P1001 [name] here for pessary check.  Doing well.  Denies vaginal odor or discharge.  Denies vaginal bleeding.    Decided to see Dr. Felipa Eth as her PCP.  Has first appointment 04/14/2018.  Not due for check up until April.  She has done a release of records for the last five years for this upcoming appointment.    GYNECOLOGIC HISTORY: Patient's last menstrual period was 02/16/1988 (lmp unknown). Contraception: PMP Menopausal hormone therapy: estradiol vag cream   Patient Active Problem List   Diagnosis Date Noted  . Intermittent palpitations 01/17/2017  . Right shoulder pain 12/14/2016  . Right carotid bruit 08/01/2015  . Uterine prolapse 06/13/2013  . Cystocele 06/13/2013  . Ex-cigarette smoker 05/04/2013  . Asthmatic bronchitis 05/04/2013  . 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 ESOPHAGEAL REFLUX 03/24/2010  . 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 COLONIC POLYPS 01/17/2008  . Hypothyroidism 01/17/2008    Past Medical History:  Diagnosis Date  . Colon polyps   . COPD (chronic obstructive pulmonary disease) ([place])   . History of chicken pox   . Hx of colonic polyps   . Hypercholesterolemia   . Hypothyroidism   . 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 MVP (mitral valve prolapse)   . Osteopenia   . Prolapsed uterus   . Varicose vein     Past Surgical History:  Procedure Laterality Date  . 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 Patient does not remember exact date of surgery.  [name] CATARACT EXTRACTION Right 10/25/2017  . [place]  . MELANOMA EXCISION  12/31/2010   Procedure: MELANOMA EXCISION;  Surgeon: Zenovia Jarred, MD;  Location: [facility];  Service: General;  Laterality: Left;  wide excision melanoma left thigh, excision lesion left thigh  . OTHER SURGICAL HISTORY Right    Surgical repair of architectual distortion Right Breast- Hyperplasia   . TONSILLECTOMY      MEDS:   Current Outpatient Medications on File Prior to Visit  Medication Sig Dispense Refill  . acyclovir ointment (ZOVIRAX) 5 % See admin instructions. Prn cold sores  0  . aspirin 81 MG tablet Take 81 mg by mouth daily.      . Calcium Carbonate-Vitamin D (CALCIUM + D PO) Take by mouth daily.      . Cholecalciferol (VITAMIN D) 2000 UNITS CAPS Take 1 capsule by mouth daily.    . Cranberry 600 MG TABS Take by mouth.    . fluticasone (FLONASE) 50 MCG/ACT nasal spray PLACE 2 SPRAYS INTO BOTH NOSTRILS AS NEEDED. 16 g 5  . hydrocortisone (ANUSOL-HC) 2.5 % rectal cream Place 1 application rectally 4 (four) times daily. 30 g 1  . hydrocortisone 2.5 % cream PATIENT IS TO MIX WITH KETOCONAZOLE CREAM! APPLY 2-3 TIMES PER DAY    . ketoconazole (NIZORAL) 2 % cream PATIENT IS TO MIX WITH HYDROCORTISONE CREAM! APPLY 2-3 TIMES PER DAY    . levothyroxine (SYNTHROID, LEVOTHROID) 88 MCG tablet Take 1 tablet (88 mcg total) by mouth daily before breakfast. 30 tablet 11  . LUTEIN PO Take 1 tablet by mouth daily.    . Multiple Vitamins-Minerals (PRESERVISION AREDS 2 PO) Take by mouth.    . nicotine polacrilex (NICORETTE) 2 MG gum Take 2 mg by mouth as needed. Not smoking    . NONFORMULARY OR COMPOUNDED ITEM Vaginal estradiol cream 0.02%.  1/2 gram per vaginal at bedtime twice weekly. 8 each 4  . valACYclovir (VALTREX) 1000 MG tablet 2 tabs (2gm) x 1, repeat in 12 hours with symptom onset 30 tablet 2   No current facility-administered medications on file prior to visit.     ALLERGIES: Codeine; Ciprofloxacin; and Sulfur  Family History  Problem Relation Age of Onset  . Hypertension Mother   . Congestive Heart Failure Mother   . Thyroid disease Mother   . Hyperlipidemia Mother   . Heart disease Mother   . Stroke Father   . Heart attack Father 17  . Diabetes Paternal Grandmother   . Diabetes Paternal Grandfather   . Hypertension Maternal Grandfather   . CVA Maternal Grandfather    . Esophageal cancer Neg Hx   . Rectal cancer Neg Hx   . Stomach cancer Neg Hx   . Colon cancer Neg Hx     SH:  Widowed, non smoker  Review of Systems  All other systems reviewed and are negative.   PHYSICAL EXAMINATION:    BP (!) 154/76 (BP Location: Right Arm, Patient Position: Sitting, Cuff Size: Normal)   Pulse 64   Resp 16   Ht 5\' 2"  (1.575 m)   Wt 117 lb (53.1 kg)   LMP 02/16/1988 (LMP Unknown)   BMI 21.40 kg/m     General appearance: alert, cooperative and appears stated age Lymph:  no inguinal LAD noted  Pelvic: External genitalia:  no lesions              Urethra:  normal appearing urethra with no masses, tenderness or lesions              Bartholins and Skenes: normal                 Vagina: normal appearing vagina with normal color and discharge, no lesions              Cervix: no lesions              Bimanual Exam:  Uterus:  normal size, contour, position, consistency, mobility, non-tender              Adnexa: no mass, fullness, tenderness  Pessary removed and cleansed.   Estrogen cream applied.  Pessary removed.  Pt tolerated this well.  Chaperone was present for exam.  Assessment: Cystocele with pessary use  Plan: Return one month.

## 2018-03-09 NOTE — Telephone Encounter (Signed)
Patient called and stated that she thought her appointment was tomorrow, but found out it was today. Patient rescheduled appointment for tomorrow at 12:45PM. Please advise on [facility] policy.

## 2018-03-10 ENCOUNTER — Ambulatory Visit: Payer: Medicare Other | Admitting: Obstetrics & Gynecology

## 2018-03-10 NOTE — Telephone Encounter (Signed)
Dr. Rogers Blocker are you ok with this transfer?

## 2018-03-10 NOTE — Telephone Encounter (Signed)
Yes that is fine

## 2018-03-17 NOTE — Telephone Encounter (Signed)
Patient already got a new PCP at [facility].

## 2018-03-17 NOTE — Telephone Encounter (Signed)
Please contact patient to cancel her existing appointment with Dr. Rogers Blocker and that she can call Dr. Doug Sou office at [phone] to schedule with Dr. [place].

## 2018-03-17 NOTE — Telephone Encounter (Signed)
See note

## 2018-04-07 ENCOUNTER — Ambulatory Visit: Payer: Medicare Other | Admitting: Obstetrics & Gynecology

## 2018-04-13 ENCOUNTER — Encounter: Payer: Self-pay | Admitting: Obstetrics & Gynecology

## 2018-04-13 ENCOUNTER — Ambulatory Visit: Payer: Medicare Other | Admitting: Obstetrics & Gynecology

## 2018-04-13 ENCOUNTER — Other Ambulatory Visit: Payer: Self-pay

## 2018-04-13 VITALS — BP 144/66 | HR 66 | Resp 14 | Ht 62.0 in | Wt 119.4 lb

## 2018-04-13 DIAGNOSIS — N8111 Cystocele, midline: Secondary | ICD-10-CM

## 2018-04-13 DIAGNOSIS — N898 Other specified noninflammatory disorders of vagina: Secondary | ICD-10-CM | POA: Diagnosis not present

## 2018-04-13 DIAGNOSIS — N765 Ulceration of vagina: Secondary | ICD-10-CM

## 2018-04-13 NOTE — Progress Notes (Signed)
GYNECOLOGY  VISIT  CC:   Pessary check  HPI: 78 y.o. G25P1001 [name] here for pessary check.  She is having a little vaginal odor.  Denies vaginal bleeding or discharge.  Denies pelvic pain.  Denies urinary symptoms.  GYNECOLOGIC HISTORY: Patient's last menstrual period was 02/16/1988 (lmp unknown). Contraception: post-menopausal  Menopausal hormone therapy: vaginal estradiol cream   Patient Active Problem List   Diagnosis Date Noted  . Intermittent palpitations 01/17/2017  . Right shoulder pain 12/14/2016  . Right carotid bruit 08/01/2015  . Uterine prolapse 06/13/2013  . Cystocele 06/13/2013  . Ex-cigarette smoker 05/04/2013  . Asthmatic bronchitis 05/04/2013  . 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 ESOPHAGEAL REFLUX 03/24/2010  . 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 COLONIC POLYPS 01/17/2008  . Hypothyroidism 01/17/2008    Past Medical History:  Diagnosis Date  . Colon polyps   . COPD (chronic obstructive pulmonary disease) ([place])   . History of chicken pox   . Hx of colonic polyps   . Hypercholesterolemia   . Hypothyroidism   . 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 MVP (mitral valve prolapse)   . Osteopenia   . Prolapsed uterus   . Varicose vein     Past Surgical History:  Procedure Laterality Date  . 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 Patient does not remember exact date of surgery.  [name] CATARACT EXTRACTION Right 10/25/2017  . [place]  . MELANOMA EXCISION  12/31/2010   Procedure: MELANOMA EXCISION;  Surgeon: Zenovia Jarred, MD;  Location: [facility];  Service: General;  Laterality: Left;  wide excision melanoma left thigh, excision lesion left thigh  . OTHER SURGICAL HISTORY Right    Surgical repair of architectual distortion Right Breast- Hyperplasia  . TONSILLECTOMY      MEDS:   Current Outpatient Medications on File Prior to Visit  Medication Sig Dispense Refill  . acyclovir  ointment (ZOVIRAX) 5 % See admin instructions. Prn cold sores  0  . aspirin 81 MG tablet Take 81 mg by mouth daily.      . Calcium Carbonate-Vitamin D (CALCIUM + D PO) Take by mouth daily.      . Cholecalciferol (VITAMIN D) 2000 UNITS CAPS Take 1 capsule by mouth daily.    . Cranberry 600 MG TABS Take by mouth.    . fluticasone (FLONASE) 50 MCG/ACT nasal spray PLACE 2 SPRAYS INTO BOTH NOSTRILS AS NEEDED. 16 g 5  . hydrocortisone (ANUSOL-HC) 2.5 % rectal cream Place 1 application rectally 4 (four) times daily. 30 g 1  . hydrocortisone 2.5 % cream PATIENT IS TO MIX WITH KETOCONAZOLE CREAM! APPLY 2-3 TIMES PER DAY    . ketoconazole (NIZORAL) 2 % cream PATIENT IS TO MIX WITH HYDROCORTISONE CREAM! APPLY 2-3 TIMES PER DAY    . levothyroxine (SYNTHROID, LEVOTHROID) 88 MCG tablet Take 1 tablet (88 mcg total) by mouth daily before breakfast. 30 tablet 11  . LUTEIN PO Take 1 tablet by mouth daily.    . Multiple Vitamins-Minerals (PRESERVISION AREDS 2 PO) Take by mouth.    . nicotine polacrilex (NICORETTE) 2 MG gum Take 2 mg by mouth as needed. Not smoking    . NONFORMULARY OR COMPOUNDED ITEM Vaginal estradiol cream 0.02%.  1/2 gram per vaginal at bedtime twice weekly. 8 each 4  . valACYclovir (VALTREX) 1000 MG tablet 2 tabs (2gm) x 1, repeat in 12 hours with symptom onset 30 tablet 2  No current facility-administered medications on file prior to visit.     ALLERGIES: Codeine; Ciprofloxacin; and Sulfur  Family History  Problem Relation Age of Onset  . Hypertension Mother   . Congestive Heart Failure Mother   . Thyroid disease Mother   . Hyperlipidemia Mother   . Heart disease Mother   . Stroke Father   . Heart attack Father 22  . Diabetes Paternal Grandmother   . Diabetes Paternal Grandfather   . Hypertension Maternal Grandfather   . CVA Maternal Grandfather   . Esophageal cancer Neg Hx   . Rectal cancer Neg Hx   . Stomach cancer Neg Hx   . Colon cancer Neg Hx     SH:  Widowed, non  smoker  Review of Systems  All other systems reviewed and are negative.   PHYSICAL EXAMINATION:    BP (!) 144/66 (BP Location: Right Arm, Patient Position: Sitting, Cuff Size: Normal)   Pulse 66   Resp 14   Ht 5\' 2"  (1.575 m)   Wt 119 lb 6.4 oz (54.2 kg)   LMP 02/16/1988 (LMP Unknown)   BMI 21.84 kg/m     General appearance: alert, cooperative and appears stated age Lymph:  no inguinal LAD noted  Pelvic: External genitalia:  no lesions              Urethra:  normal appearing urethra with no masses, tenderness or lesions              Bartholins and Skenes: normal                 Vagina: normal appearing vagina with normal color and discharge, superficial irritation on inferior aspect of cervix and on vagina              Cervix: no lesions              Bimanual Exam:  Uterus:  normal size, contour, position, consistency, mobility, non-tender              Adnexa: no mass, fullness, tenderness  Pessary was removed and cleansed and left out.  Vaginal estrogen applied.    Chaperone was present for exam.  Assessment: Midline cystocele Superficial vaginal ulceration Vaginal odor  Plan: Will leave pessary out and recheck in 2 weeks

## 2018-04-25 ENCOUNTER — Ambulatory Visit: Payer: Medicare Other | Admitting: Obstetrics & Gynecology

## 2018-04-25 VITALS — BP 130/60 | HR 72 | Resp 16 | Ht 62.0 in | Wt 116.2 lb

## 2018-04-25 DIAGNOSIS — N765 Ulceration of vagina: Secondary | ICD-10-CM

## 2018-04-25 DIAGNOSIS — Z4689 Encounter for fitting and adjustment of other specified devices: Secondary | ICD-10-CM

## 2018-04-25 DIAGNOSIS — N8111 Cystocele, midline: Secondary | ICD-10-CM

## 2018-04-25 NOTE — Progress Notes (Signed)
GYNECOLOGY  VISIT  CC:   Pessary check  HPI: 78 y.o. G52P1001 [name] here for Pessary check.  Pt had area of vaginal excoriation beneath the cervix two weeks ago.  Pessary was left out.  Reports bleeding and discharge have resolved.  We have discussed the possibility of using a different pessary.  She is willing to try this at this time as she is very symptomatic from her cystocele when the pessary is removed.    She would like current pessary replaced.  Is using a #3 incontinence ring with support.  Last year I did try a #2 but this was too small and her bladder came around the pessary.  She is going to be driving to [place] and will want this placed after she returns.  GYNECOLOGIC HISTORY: Patient's last menstrual period was 02/16/1988 (lmp unknown). Contraception: PMP Menopausal hormone therapy: none  Patient Active Problem List   Diagnosis Date Noted  . Intermittent palpitations 01/17/2017  . Right shoulder pain 12/14/2016  . Right carotid bruit 08/01/2015  . Uterine prolapse 06/13/2013  . Cystocele 06/13/2013  . Ex-cigarette smoker 05/04/2013  . Asthmatic bronchitis 05/04/2013  . 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 ESOPHAGEAL REFLUX 03/24/2010  . 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 COLONIC POLYPS 01/17/2008  . Hypothyroidism 01/17/2008    Past Medical History:  Diagnosis Date  . Colon polyps   . COPD (chronic obstructive pulmonary disease) ([place])   . History of chicken pox   . Hx of colonic polyps   . Hypercholesterolemia   . Hypothyroidism   . 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 MVP (mitral valve prolapse)   . Osteopenia   . Prolapsed uterus   . Varicose vein     Past Surgical History:  Procedure Laterality Date  . 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 Patient does not remember exact date of surgery.  [name] CATARACT EXTRACTION Right 10/25/2017  . [place]  . MELANOMA EXCISION  12/31/2010   Procedure:  MELANOMA EXCISION;  Surgeon: Zenovia Jarred, MD;  Location: [facility];  Service: General;  Laterality: Left;  wide excision melanoma left thigh, excision lesion left thigh  . OTHER SURGICAL HISTORY Right    Surgical repair of architectual distortion Right Breast- Hyperplasia  . TONSILLECTOMY      MEDS:   Current Outpatient Medications on File Prior to Visit  Medication Sig Dispense Refill  . acyclovir ointment (ZOVIRAX) 5 % See admin instructions. Prn cold sores  0  . aspirin 81 MG tablet Take 81 mg by mouth daily.      . Calcium Carbonate-Vitamin D (CALCIUM + D PO) Take by mouth daily.      . Cholecalciferol (VITAMIN D) 2000 UNITS CAPS Take 1 capsule by mouth daily.    . Cranberry 600 MG TABS Take by mouth.    . famotidine (PEPCID) 40 MG tablet Take 1 tablet by mouth daily.    . fluticasone (FLONASE) 50 MCG/ACT nasal spray PLACE 2 SPRAYS INTO BOTH NOSTRILS AS NEEDED. 16 g 5  . hydrocortisone (ANUSOL-HC) 2.5 % rectal cream Place 1 application rectally 4 (four) times daily. 30 g 1  . hydrocortisone 2.5 % cream PATIENT IS TO MIX WITH KETOCONAZOLE CREAM! APPLY 2-3 TIMES PER DAY    . ketoconazole (NIZORAL) 2 % cream PATIENT IS TO MIX WITH HYDROCORTISONE CREAM! APPLY 2-3 TIMES PER DAY    . levothyroxine (SYNTHROID, LEVOTHROID) 88 MCG  tablet Take 1 tablet (88 mcg total) by mouth daily before breakfast. 30 tablet 11  . LUTEIN PO Take 1 tablet by mouth daily.    . Multiple Vitamins-Minerals (PRESERVISION AREDS 2 PO) Take by mouth.    . nicotine polacrilex (NICORETTE) 2 MG gum Take 2 mg by mouth as needed. Not smoking    . NONFORMULARY OR COMPOUNDED ITEM Vaginal estradiol cream 0.02%.  1/2 gram per vaginal at bedtime twice weekly. 8 each 4  . valACYclovir (VALTREX) 1000 MG tablet 2 tabs (2gm) x 1, repeat in 12 hours with symptom onset 30 tablet 2   No current facility-administered medications on file prior to visit.     ALLERGIES: Codeine; Ciprofloxacin; and Sulfur  Family  History  Problem Relation Age of Onset  . Hypertension Mother   . Congestive Heart Failure Mother   . Thyroid disease Mother   . Hyperlipidemia Mother   . Heart disease Mother   . Stroke Father   . Heart attack Father 42  . Diabetes Paternal Grandmother   . Diabetes Paternal Grandfather   . Hypertension Maternal Grandfather   . CVA Maternal Grandfather   . Esophageal cancer Neg Hx   . Rectal cancer Neg Hx   . Stomach cancer Neg Hx   . Colon cancer Neg Hx     SH:  Widowed, non smoker  Review of Systems  All other systems reviewed and are negative.   PHYSICAL EXAMINATION:    BP 130/60 (BP Location: Right Arm, Patient Position: Sitting, Cuff Size: Large)   Pulse 72   Resp 16   Ht 5\' 2"  (1.575 m)   Wt 116 lb 3.2 oz (52.7 kg)   LMP 02/16/1988 (LMP Unknown)   BMI 21.25 kg/m     General appearance: alert, cooperative and appears stated age Lymph:  no inguinal LAD noted  Pelvic: External genitalia:  no lesions              Urethra:  normal appearing urethra with no masses, tenderness or lesions              Bartholins and Skenes: normal                 Vagina: normal appearing vagina with normal color and discharge, no lesions and specifically area beneath cervix is resolved              Cervix: no lesions              Bimanual Exam:  Uterus:  normal size, contour, position, consistency, mobility, non-tender              Adnexa: no mass, fullness, tenderness  Pessary replaced without difficulty.    Chaperone was present for exam.  Assessment: Midline cystocele Vaginal excoriation that has healed Pessary used  Plan: Incontinence ring with support replaced today. Will order dish pessary.  Measured today while pt was in office and feel    ~15 minutes spent with patient >50% of time was in face to face discussion of above.

## 2018-04-27 ENCOUNTER — Ambulatory Visit: Payer: Medicare Other | Admitting: Obstetrics & Gynecology

## 2018-04-28 ENCOUNTER — Encounter: Payer: Self-pay | Admitting: Obstetrics & Gynecology

## 2018-05-18 ENCOUNTER — Other Ambulatory Visit: Payer: Self-pay

## 2018-05-18 ENCOUNTER — Encounter: Payer: Self-pay | Admitting: Obstetrics and Gynecology

## 2018-05-18 ENCOUNTER — Ambulatory Visit: Payer: Medicare Other | Admitting: Obstetrics and Gynecology

## 2018-05-18 VITALS — BP 136/70 | HR 76 | Resp 12 | Ht 62.0 in | Wt 117.0 lb

## 2018-05-18 DIAGNOSIS — N765 Ulceration of vagina: Secondary | ICD-10-CM

## 2018-05-18 DIAGNOSIS — N812 Incomplete uterovaginal prolapse: Secondary | ICD-10-CM | POA: Diagnosis not present

## 2018-05-18 DIAGNOSIS — Z4689 Encounter for fitting and adjustment of other specified devices: Secondary | ICD-10-CM

## 2018-05-18 NOTE — Progress Notes (Signed)
GYNECOLOGY  VISIT   HPI: 78 y.o.   Widowed  Caucasian  female   G1P1001 with Patient's last menstrual period was 02/16/1988 (lmp unknown).   here for pessary removal; patient complains of vaginal discharge and odor with pessary.  Discharge starting 4 days ago.   Last used vaginal estrogen with insertion of pessary on 04/25/18.  She does not remove the pessary on her own.   Did never use the incontinence dish that was ordered.  She is using a pessary with support.  States she is very happy with the pessary except that it is rubbing.  Has discomfort with placing and removing the pessary. She does this at her office visits and not at home.   New PCP - Dr. Felipa Eth.   GYNECOLOGIC HISTORY: Patient's last menstrual period was 02/16/1988 (lmp unknown). Contraception:  Postmenopausal Menopausal hormone therapy:  Estradiol cream Last mammogram:  12/31/16 BIRADS 2 benign/density b Last pap smear:   2012 Negative        OB History    Gravida  1   Para  1   Term  1   Preterm      AB      Living  1     SAB      TAB      Ectopic      Multiple      Live Births                 Patient Active Problem List   Diagnosis Date Noted  . Intermittent palpitations 01/17/2017  . Right shoulder pain 12/14/2016  . Right carotid bruit 08/01/2015  . Uterine prolapse 06/13/2013  . Cystocele 06/13/2013  . Ex-cigarette smoker 05/04/2013  . Asthmatic bronchitis 05/04/2013  . 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 ESOPHAGEAL REFLUX 03/24/2010  . 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 COLONIC POLYPS 01/17/2008  . Hypothyroidism 01/17/2008    Past Medical History:  Diagnosis Date  . Colon polyps   . COPD (chronic obstructive pulmonary disease) ([place])   . History of chicken pox   . Hx of colonic polyps   . Hypercholesterolemia   . Hypothyroidism   . 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 MVP (mitral valve prolapse)   . Osteopenia   . Prolapsed uterus   . Varicose vein     Past  Surgical History:  Procedure Laterality Date  . 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 Patient does not remember exact date of surgery.  [name] CATARACT EXTRACTION Right 10/25/2017  . [place]  . MELANOMA EXCISION  12/31/2010   Procedure: MELANOMA EXCISION;  Surgeon: Zenovia Jarred, MD;  Location: [facility];  Service: General;  Laterality: Left;  wide excision melanoma left thigh, excision lesion left thigh  . OTHER SURGICAL HISTORY Right    Surgical repair of architectual distortion Right Breast- Hyperplasia  . TONSILLECTOMY      Current Outpatient Medications  Medication Sig Dispense Refill  . acyclovir ointment (ZOVIRAX) 5 % See admin instructions. Prn cold sores  0  . aspirin 81 MG tablet Take 81 mg by mouth daily.      . Calcium Carbonate-Vitamin D (CALCIUM + D PO) Take by mouth daily.      . Cholecalciferol (VITAMIN D) 2000 UNITS CAPS Take 1 capsule by mouth daily.    . Cranberry 600 MG TABS Take by mouth.    . famotidine (PEPCID) 40 MG tablet Take 1 tablet  by mouth daily.    . fluticasone (FLONASE) 50 MCG/ACT nasal spray PLACE 2 SPRAYS INTO BOTH NOSTRILS AS NEEDED. 16 g 5  . hydrocortisone (ANUSOL-HC) 2.5 % rectal cream Place 1 application rectally 4 (four) times daily. 30 g 1  . hydrocortisone 2.5 % cream PATIENT IS TO MIX WITH KETOCONAZOLE CREAM! APPLY 2-3 TIMES PER DAY    . ketoconazole (NIZORAL) 2 % cream PATIENT IS TO MIX WITH HYDROCORTISONE CREAM! APPLY 2-3 TIMES PER DAY    . levothyroxine (SYNTHROID, LEVOTHROID) 88 MCG tablet Take 1 tablet (88 mcg total) by mouth daily before breakfast. 30 tablet 11  . LUTEIN PO Take 1 tablet by mouth daily.    . Multiple Vitamins-Minerals (PRESERVISION AREDS 2 PO) Take by mouth.    . nicotine polacrilex (NICORETTE) 2 MG gum Take 2 mg by mouth as needed. Not smoking    . NONFORMULARY OR COMPOUNDED ITEM Vaginal estradiol cream 0.02%.  1/2 gram per vaginal at bedtime twice weekly. 8 each 4  .  valACYclovir (VALTREX) 1000 MG tablet 2 tabs (2gm) x 1, repeat in 12 hours with symptom onset 30 tablet 2   No current facility-administered medications for this visit.      ALLERGIES: Codeine; Ciprofloxacin; and Sulfur  Family History  Problem Relation Age of Onset  . Hypertension Mother   . Congestive Heart Failure Mother   . Thyroid disease Mother   . Hyperlipidemia Mother   . Heart disease Mother   . Stroke Father   . Heart attack Father 54  . Diabetes Paternal Grandmother   . Diabetes Paternal Grandfather   . Hypertension Maternal Grandfather   . CVA Maternal Grandfather   . Esophageal cancer Neg Hx   . Rectal cancer Neg Hx   . Stomach cancer Neg Hx   . Colon cancer Neg Hx     Social History   Socioeconomic History  . Marital status: Widowed    Spouse name: fred Murad  . Number of children: Not on file  . Years of education: Not on file  . Highest education level: Not on file  Occupational History  . Not on file  Social Needs  . Financial resource strain: Not on file  . Food insecurity:    Worry: Not on file    Inability: Not on file  . Transportation needs:    Medical: Not on file    Non-medical: Not on file  Tobacco Use  . Smoking status: Former Smoker    Packs/day: 1.00    Years: 50.00    Pack years: 50.00    Types: Cigarettes    Start date: 03/08/1960    Last attempt to quit: 07/23/2010    Years since quitting: 7.8  . Smokeless tobacco: Never Used  . Tobacco comment: quit smoking 3 years ago  Substance and Sexual Activity  . Alcohol use: No    Alcohol/week: 0.0 standard drinks  . Drug use: No  . Sexual activity: Not Currently    Partners: Male    Birth control/protection: Post-menopausal  Lifestyle  . Physical activity:    Days per week: Not on file    Minutes per session: Not on file  . Stress: Not on file  Relationships  . Social connections:    Talks on phone: Not on file    Gets together: Not on file    Attends religious service: Not on  file    Active member of club or organization: Not on file    Attends meetings  of clubs or organizations: Not on file    Relationship status: Not on file  . Intimate partner violence:    Fear of current or ex partner: Not on file    Emotionally abused: Not on file    Physically abused: Not on file    Forced sexual activity: Not on file  Other Topics Concern  . Not on file  Social History Narrative  . Not on file    Review of Systems  Constitutional: Negative.   HENT: Negative.   Eyes: Negative.   Respiratory: Negative.   Cardiovascular: Negative.   Gastrointestinal: Negative.   Endocrine: Negative.   Genitourinary: Positive for vaginal discharge.       Vaginal odor  Musculoskeletal: Negative.   Skin: Negative.   Allergic/Immunologic: Negative.   Neurological: Negative.   Hematological: Negative.   Psychiatric/Behavioral: Negative.     PHYSICAL EXAMINATION:    BP 136/70 (BP Location: Right Arm, Patient Position: Sitting, Cuff Size: Normal)   Pulse 76   Resp 12   Ht 5\' 2"  (1.575 m)   Wt 117 lb (53.1 kg)   LMP 02/16/1988 (LMP Unknown)   BMI 21.40 kg/m     General appearance: alert, cooperative and appears stated age   Pelvic: External genitalia:  no lesions              Urethra:  normal appearing urethra with no masses, tenderness or lesions              Bartholins and Skenes: normal                 Vagina:  1.5 cm shallow ulcer of the posterior vaginal apex.              Cervix: no lesions                Bimanual Exam:  Uterus:  normal size, contour, position, consistency, mobility, non-tender              Adnexa: no mass, fullness, tenderness     Pessary removed, cleansed and given to patient in a biobag. Estradiol cream 1 gram placed in the vagina.   Chaperone was present for exam.  ASSESSMENT  Incomplete uterovaginal prolapse.  Pessary maintenance.  Posterior vaginal ulceration.  Chronic inflammation. Hx right breast hyperplasia.  PLAN  We  discussed her chronic inflammation and that the ideal is to have none, but that this may not be achievable.  I really encouraged her to use the estradiol cream 1/2 pv at hs three times a week.  We discussed other pessary shapes, but these would be more difficult to place and remove.  The patient declines this option after hearing this.  She will leave the pessary out and return in 2 weeks for a recheck and hopefully have the pessary placed again.  She affirms she declines surgical care for her prolapse. Will get a copy of her last mammogram from 2019 from [facility].   An After Visit Summary was printed and given to the patient.  __25____ minutes face to face time of which over 50% was spent in counseling.

## 2018-05-22 ENCOUNTER — Ambulatory Visit: Payer: Medicare Other | Admitting: Family Medicine

## 2018-06-05 ENCOUNTER — Other Ambulatory Visit: Payer: Self-pay | Admitting: Obstetrics and Gynecology

## 2018-06-05 NOTE — Telephone Encounter (Signed)
Patient requesting a refill on hydrocortisone cream for hemorrhoids. Says pharmacy faxed over request last week. [facility] [address] [phone]

## 2018-06-05 NOTE — Telephone Encounter (Signed)
Medication refill request: Hydrocortisone Last AEX:  12/24/15 BS Last OV: 05/18/18  Next AEX: 06/30/18 BS Last MMG (if hormonal medication request): 12/31/16 BIRADS 2 benign/density b Refill authorized: Order pended #30g w/1 refill if authorized

## 2018-06-06 ENCOUNTER — Ambulatory Visit: Payer: Medicare Other | Admitting: Obstetrics & Gynecology

## 2018-06-06 ENCOUNTER — Ambulatory Visit: Payer: Medicare Other | Admitting: Obstetrics and Gynecology

## 2018-06-06 MED ORDER — HYDROCORTISONE 2.5 % EX CREA: TOPICAL_CREAM | CUTANEOUS | 1 refills | Status: DC

## 2018-06-27 ENCOUNTER — Ambulatory Visit: Payer: Medicare Other | Admitting: Obstetrics and Gynecology

## 2018-06-30 ENCOUNTER — Ambulatory Visit: Payer: Medicare Other | Admitting: Obstetrics and Gynecology

## 2018-07-12 ENCOUNTER — Other Ambulatory Visit: Payer: Self-pay

## 2018-07-14 ENCOUNTER — Ambulatory Visit: Payer: Medicare Other | Admitting: Obstetrics and Gynecology

## 2018-08-07 IMAGING — DX DG CHEST 2V
2 series · 2 of 2 positions shown · non-contrast
Comparison: 06/12/2015, 06/11/2014 and earlier.

CLINICAL DATA: 75-year-old with current history of mitral valve
prolapse, COPD and personal history of left thigh melanoma. Health
maintenance.

EXAM:
CHEST  2 VIEW

[chest pa]
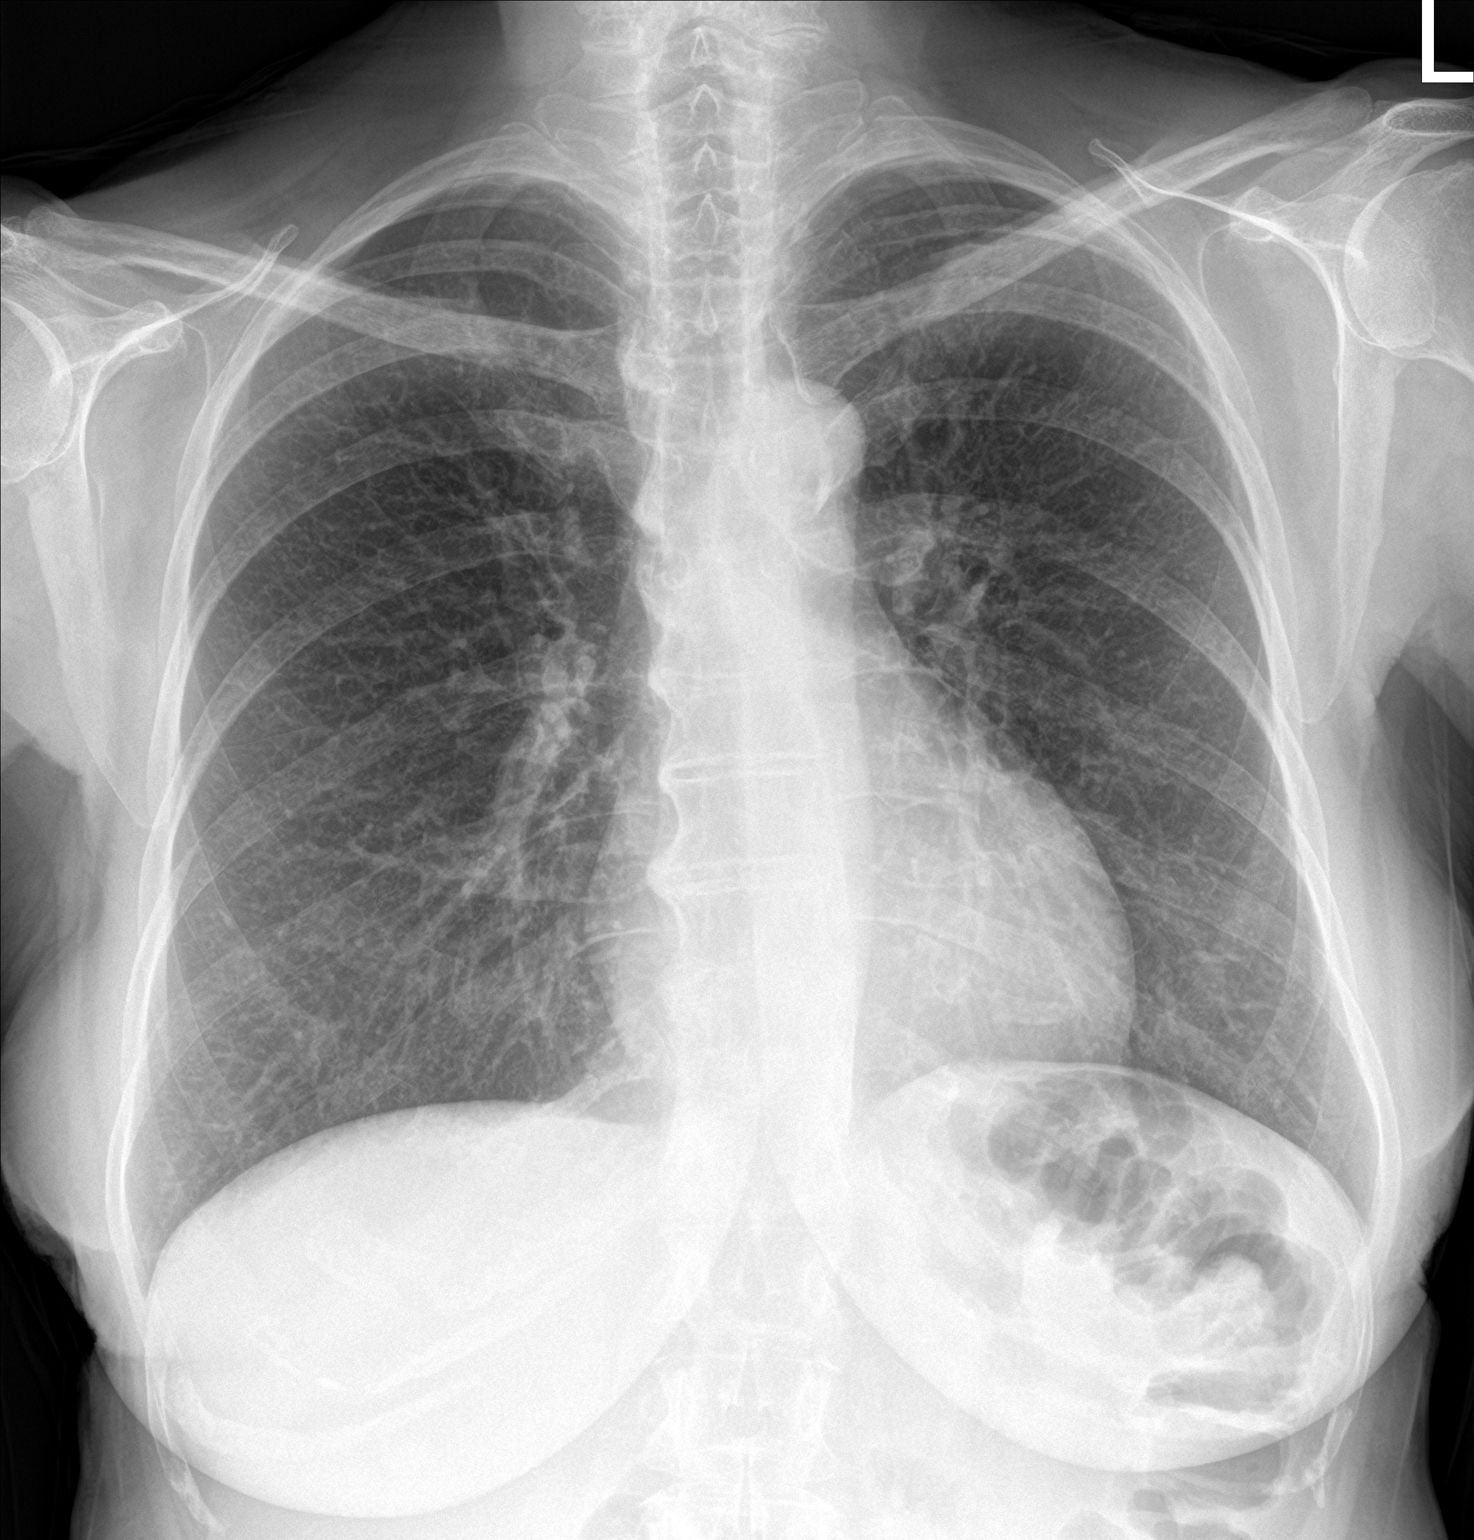

[chest lat]
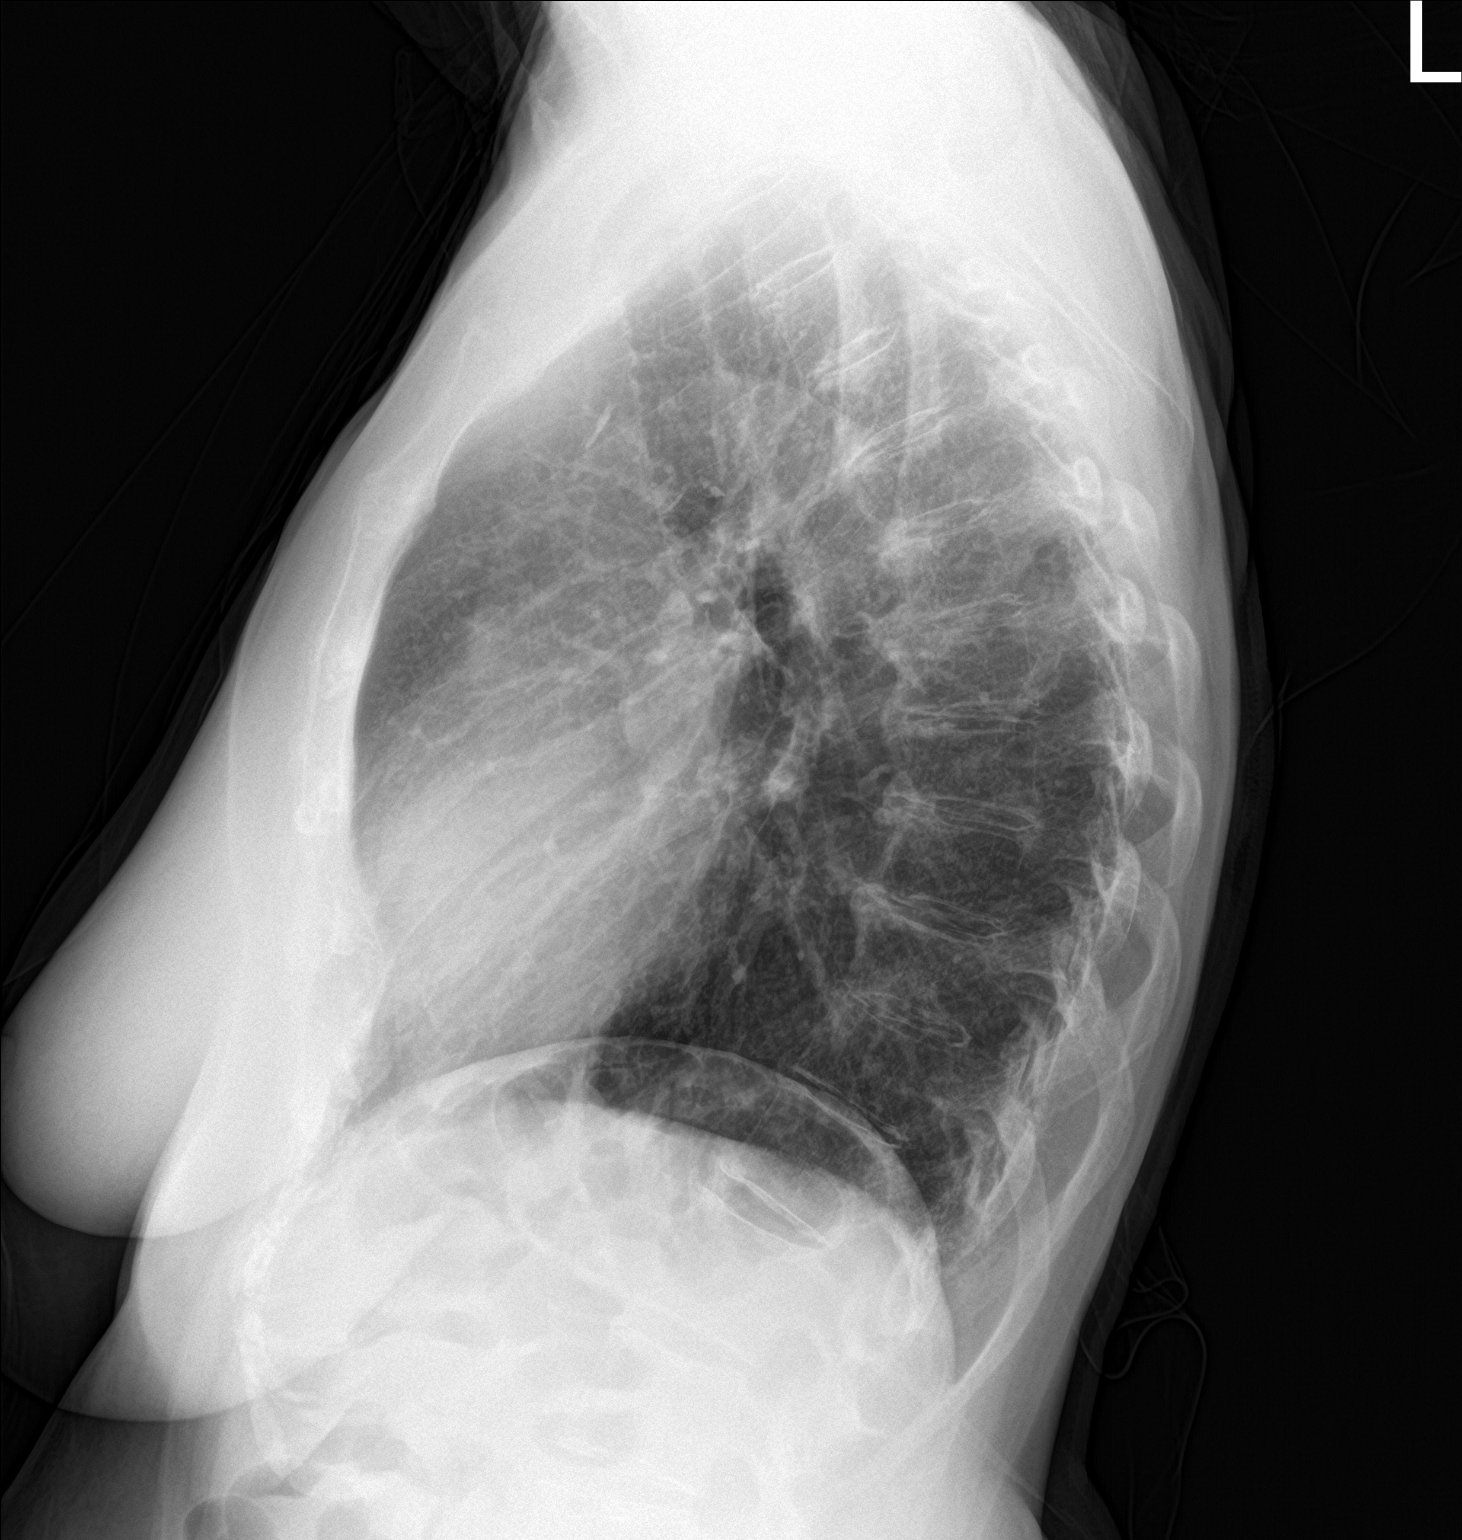

[2 of 2 positions shown; findings below may reference images not displayed]

FINDINGS: Cardiac silhouette normal in size, unchanged. Thoracic aorta mildly
atherosclerotic, unchanged. Hilar and mediastinal contours otherwise
unremarkable. Mildly prominent bronchovascular markings diffusely
and mild central peribronchial thickening, unchanged. Lungs
otherwise clear. No localized airspace consolidation. No pleural
effusions. No pneumothorax. Normal pulmonary vascularity.
Degenerative changes involving the thoracic spine. No interval
change.
IMPRESSION: No acute cardiopulmonary disease. Stable mild changes of chronic
bronchitis and/or asthma.

## 2018-08-08 ENCOUNTER — Other Ambulatory Visit: Payer: Self-pay

## 2018-08-09 ENCOUNTER — Ambulatory Visit: Payer: Medicare Other | Admitting: Obstetrics and Gynecology

## 2018-08-10 ENCOUNTER — Ambulatory Visit: Payer: Medicare Other | Admitting: Obstetrics & Gynecology

## 2018-08-10 ENCOUNTER — Encounter: Payer: Self-pay | Admitting: Obstetrics & Gynecology

## 2018-08-10 ENCOUNTER — Other Ambulatory Visit: Payer: Self-pay

## 2018-08-10 VITALS — BP 140/68 | HR 68 | Temp 98.0°F | Ht 62.0 in | Wt 117.0 lb

## 2018-08-10 DIAGNOSIS — N812 Incomplete uterovaginal prolapse: Secondary | ICD-10-CM

## 2018-08-10 DIAGNOSIS — N8111 Cystocele, midline: Secondary | ICD-10-CM | POA: Diagnosis not present

## 2018-08-10 DIAGNOSIS — Z4689 Encounter for fitting and adjustment of other specified devices: Secondary | ICD-10-CM

## 2018-08-10 NOTE — Progress Notes (Signed)
GYNECOLOGY  VISIT  CC:   Pessary check   HPI: 78 y.o. G39P1001 [name] here for pessary check.  Pessary was removed 05/18/2018 due to vaginal bleeding and discharge.  The discharge has resolved as has the bleeding.  She is bothered by her prolapse and really wants a pessary replaced.  We have discussed in the past changing pessary styles as she has recurrent vaginal ulcerations due to pressure from the pessary.  She wants to discuss this today.  Styles reviewed.  Dish pessary with support has been ordered and I do have it for placement if she desires this today.  After discussion, she desired to proceed with placement of new pessary.  She is worried this won't be big enough and the bladder will come around the pessary.    GYNECOLOGIC HISTORY: Patient's last menstrual period was 02/16/1988 (lmp unknown). Contraception: PMP Menopausal hormone therapy: estradiol vag cream   Patient Active Problem List   Diagnosis Date Noted  . Intermittent palpitations 01/17/2017  . Right shoulder pain 12/14/2016  . Right carotid bruit 08/01/2015  . Uterine prolapse 06/13/2013  . Cystocele 06/13/2013  . Ex-cigarette smoker 05/04/2013  . Asthmatic bronchitis 05/04/2013  . 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 ESOPHAGEAL REFLUX 03/24/2010  . 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 COLONIC POLYPS 01/17/2008  . Hypothyroidism 01/17/2008    Past Medical History:  Diagnosis Date  . Colon polyps   . COPD (chronic obstructive pulmonary disease) ([place])   . History of chicken pox   . Hx of colonic polyps   . Hypercholesterolemia   . Hypothyroidism   . 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 MVP (mitral valve prolapse)   . Osteopenia   . Prolapsed uterus   . Varicose vein     Past Surgical History:  Procedure Laterality Date  . 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 Patient does not remember exact date of surgery.  [name] CATARACT EXTRACTION Right 10/25/2017  . [place]  . MELANOMA EXCISION  12/31/2010   Procedure: MELANOMA EXCISION;  Surgeon: Zenovia Jarred, MD;  Location: [facility];  Service: General;  Laterality: Left;  wide excision melanoma left thigh, excision lesion left thigh  . OTHER SURGICAL HISTORY Right    Surgical repair of architectual distortion Right Breast- Hyperplasia  . TONSILLECTOMY      MEDS:   Current Outpatient Medications on File Prior to Visit  Medication Sig Dispense Refill  . acyclovir ointment (ZOVIRAX) 5 % See admin instructions. Prn cold sores  0  . aspirin 81 MG tablet Take 81 mg by mouth daily.      . Calcium Carbonate-Vitamin D (CALCIUM + D PO) Take by mouth daily.      . Cholecalciferol (VITAMIN D) 2000 UNITS CAPS Take 1 capsule by mouth daily.    . fluticasone (FLONASE) 50 MCG/ACT nasal spray PLACE 2 SPRAYS INTO BOTH NOSTRILS AS NEEDED. 16 g 5  . hydrocortisone 2.5 % cream Place 1 application rectally 4 times daily 30 g 1  . ketoconazole (NIZORAL) 2 % cream PATIENT IS TO MIX WITH HYDROCORTISONE CREAM! APPLY 2-3 TIMES PER DAY    . levothyroxine (SYNTHROID, LEVOTHROID) 88 MCG tablet Take 1 tablet (88 mcg total) by mouth daily before breakfast. 30 tablet 11  . LUTEIN PO Take 1 tablet by mouth daily.    . Multiple Vitamins-Minerals (PRESERVISION AREDS 2 PO) Take by mouth.    . nicotine  polacrilex (NICORETTE) 2 MG gum Take 2 mg by mouth as needed. Not smoking    . NONFORMULARY OR COMPOUNDED ITEM Vaginal estradiol cream 0.02%.  1/2 gram per vaginal at bedtime twice weekly. 8 each 4  . valACYclovir (VALTREX) 1000 MG tablet 2 tabs (2gm) x 1, repeat in 12 hours with symptom onset 30 tablet 2   No current facility-administered medications on file prior to visit.     ALLERGIES: Codeine, Ciprofloxacin, and Sulfur  Family History  Problem Relation Age of Onset  . Hypertension Mother   . Congestive Heart Failure Mother   . Thyroid disease Mother   . Hyperlipidemia Mother   . Heart disease  Mother   . Stroke Father   . Heart attack Father 9  . Diabetes Paternal Grandmother   . Diabetes Paternal Grandfather   . Hypertension Maternal Grandfather   . CVA Maternal Grandfather   . Esophageal cancer Neg Hx   . Rectal cancer Neg Hx   . Stomach cancer Neg Hx   . Colon cancer Neg Hx     SH:  Widowed, non smoker  Review of Systems  All other systems reviewed and are negative.   PHYSICAL EXAMINATION:    BP 140/68   Pulse 68   Temp 98 F (36.7 C) (Temporal)   Ht 5\' 2"  (1.575 m)   Wt 117 lb (53.1 kg)   LMP 02/16/1988 (LMP Unknown)   BMI 21.40 kg/m     General appearance: alert, cooperative and appears stated age Lymph:  no inguinal LAD noted  Pelvic: External genitalia:  no lesions              Urethra:  normal appearing urethra with no masses, tenderness or lesions              Bartholins and Skenes: normal                 Vagina: normal appearing vagina with normal color and discharge, no lesions, 3rd degree cystocele noted              Cervix: no lesions              Bimanual Exam:  Uterus:  normal size, contour, position, consistency, mobility, non-tender              Adnexa: no mass, fullness, tenderness  #3 dish pessary with support placed easily.  Pt moved, squatted, walked and has no bladder prolapsing around the pessary.  Pt tolerated procedure very well.  Chaperone was present for exam.  Assessment: Cystocele and incomplete uterine prolapse  Plan: Recheck 1 month.  Pt knows to call with any concerns prior to that time.   ~15 minutes spent with patient >50% of time was in face to face discussion of above.

## 2018-08-23 ENCOUNTER — Telehealth: Payer: Self-pay | Admitting: Obstetrics & Gynecology

## 2018-08-23 NOTE — Telephone Encounter (Signed)
VOID

## 2018-08-23 NOTE — Progress Notes (Signed)
GYNECOLOGY  VISIT  CC:   Vaginal discharge  HPI: 78 y.o. G60P1001 [name] here for pessary check. Patient complaining of vaginal discharge with odor.  Denies pain or vaginal bleeding.  Denies urinary complaints.  Had no pelvic pain or fever.  New dish pessary was placed a little over two weeks ago.  GYNECOLOGIC HISTORY: Patient's last menstrual period was 02/16/1988 (lmp unknown). Contraception: Post menopausal  Menopausal hormone therapy: estradiol vaginal cream   Patient Active Problem List   Diagnosis Date Noted  . Intermittent palpitations 01/17/2017  . Right shoulder pain 12/14/2016  . Right carotid bruit 08/01/2015  . Uterine prolapse 06/13/2013  . Cystocele 06/13/2013  . Ex-cigarette smoker 05/04/2013  . Asthmatic bronchitis 05/04/2013  . 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 ESOPHAGEAL REFLUX 03/24/2010  . 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 COLONIC POLYPS 01/17/2008  . Hypothyroidism 01/17/2008    Past Medical History:  Diagnosis Date  . Colon polyps   . COPD (chronic obstructive pulmonary disease) ([place])   . History of chicken pox   . Hx of colonic polyps   . Hypercholesterolemia   . Hypothyroidism   . 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 MVP (mitral valve prolapse)   . Osteopenia   . Prolapsed uterus   . Varicose vein     Past Surgical History:  Procedure Laterality Date  . 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 Patient does not remember exact date of surgery.  [name] CATARACT EXTRACTION Right 10/25/2017  . [place]  . MELANOMA EXCISION  12/31/2010   Procedure: MELANOMA EXCISION;  Surgeon: Zenovia Jarred, MD;  Location: [facility];  Service: General;  Laterality: Left;  wide excision melanoma left thigh, excision lesion left thigh  . OTHER SURGICAL HISTORY Right    Surgical repair of architectual distortion Right Breast- Hyperplasia  . TONSILLECTOMY      MEDS:   Current Outpatient  Medications on File Prior to Visit  Medication Sig Dispense Refill  . acyclovir ointment (ZOVIRAX) 5 % See admin instructions. Prn cold sores  0  . aspirin 81 MG tablet Take 81 mg by mouth daily.      . Calcium Carbonate-Vitamin D (CALCIUM + D PO) Take by mouth daily.      . Cholecalciferol (VITAMIN D) 2000 UNITS CAPS Take 1 capsule by mouth daily.    . fluticasone (FLONASE) 50 MCG/ACT nasal spray PLACE 2 SPRAYS INTO BOTH NOSTRILS AS NEEDED. 16 g 5  . hydrocortisone 2.5 % cream Place 1 application rectally 4 times daily 30 g 1  . ketoconazole (NIZORAL) 2 % cream PATIENT IS TO MIX WITH HYDROCORTISONE CREAM! APPLY 2-3 TIMES PER DAY    . levothyroxine (SYNTHROID, LEVOTHROID) 88 MCG tablet Take 1 tablet (88 mcg total) by mouth daily before breakfast. 30 tablet 11  . LUTEIN PO Take 1 tablet by mouth daily.    . Multiple Vitamins-Minerals (PRESERVISION AREDS 2 PO) Take by mouth.    . nicotine polacrilex (NICORETTE) 2 MG gum Take 2 mg by mouth as needed. Not smoking    . NONFORMULARY OR COMPOUNDED ITEM Vaginal estradiol cream 0.02%.  1/2 gram per vaginal at bedtime twice weekly. 8 each 4  . valACYclovir (VALTREX) 1000 MG tablet 2 tabs (2gm) x 1, repeat in 12 hours with symptom onset 30 tablet 2   No current facility-administered medications on file prior to visit.     ALLERGIES: Codeine, Ciprofloxacin, and  Sulfur  Family History  Problem Relation Age of Onset  . Hypertension Mother   . Congestive Heart Failure Mother   . Thyroid disease Mother   . Hyperlipidemia Mother   . Heart disease Mother   . Stroke Father   . Heart attack Father 15  . Diabetes Paternal Grandmother   . Diabetes Paternal Grandfather   . Hypertension Maternal Grandfather   . CVA Maternal Grandfather   . Esophageal cancer Neg Hx   . Rectal cancer Neg Hx   . Stomach cancer Neg Hx   . Colon cancer Neg Hx     SH:  Widowed, non smoker  Review of Systems  Genitourinary: Positive for vaginal discharge.       Vaginal  odor  All other systems reviewed and are negative.   PHYSICAL EXAMINATION:    Temp 97.6 F (36.4 C) (Temporal)   Resp 14   Ht 5' 2.25" (1.581 m)   Wt 117 lb (53.1 kg)   LMP 02/16/1988 (LMP Unknown)   BMI 21.23 kg/m     General appearance: alert, cooperative and appears stated age Lymph:  no inguinal LAD noted  Pelvic: External genitalia:  no lesions              Urethra:  normal appearing urethra with no masses, tenderness or lesions              Bartholins and Skenes: normal                 Vagina: normal appearing vagina with normal color and discharge, no lesions, dish pessary removed, no lesions but discharge is present              Cervix: no lesions              Bimanual Exam:  Uterus:  normal size, contour, position, consistency, mobility, non-tender              Adnexa: no mass, fullness, tenderness  Chaperone was present for exam.  Assessment: Vaginal discharge after placement of new pessary  Plan: Will try using Metrogel 0.75% nightly x 5 nights.  Pt instructed how to place around pessary.  Will recheck 2 weeks.

## 2018-08-24 ENCOUNTER — Other Ambulatory Visit: Payer: Self-pay

## 2018-08-24 ENCOUNTER — Encounter: Payer: Self-pay | Admitting: Obstetrics & Gynecology

## 2018-08-24 ENCOUNTER — Ambulatory Visit: Payer: Medicare Other | Admitting: Obstetrics & Gynecology

## 2018-08-24 VITALS — BP 138/66 | HR 72 | Temp 97.6°F | Resp 14 | Ht 62.25 in | Wt 117.0 lb

## 2018-08-24 DIAGNOSIS — N8111 Cystocele, midline: Secondary | ICD-10-CM

## 2018-08-24 DIAGNOSIS — Z960 Presence of urogenital implants: Secondary | ICD-10-CM | POA: Diagnosis not present

## 2018-08-24 DIAGNOSIS — N898 Other specified noninflammatory disorders of vagina: Secondary | ICD-10-CM

## 2018-08-24 MED ORDER — METRONIDAZOLE 0.75 % VA GEL: 1.0000 | Freq: Every day | VAGINAL | 0 refills | Status: DC

## 2018-09-08 ENCOUNTER — Other Ambulatory Visit: Payer: Self-pay

## 2018-09-12 ENCOUNTER — Ambulatory Visit (INDEPENDENT_AMBULATORY_CARE_PROVIDER_SITE_OTHER): Payer: Medicare Other | Admitting: Obstetrics & Gynecology

## 2018-09-12 ENCOUNTER — Encounter: Payer: Self-pay | Admitting: Obstetrics & Gynecology

## 2018-09-12 ENCOUNTER — Other Ambulatory Visit: Payer: Self-pay

## 2018-09-12 VITALS — BP 118/72 | HR 70 | Temp 98.2°F | Ht 62.25 in | Wt 117.0 lb

## 2018-09-12 DIAGNOSIS — N8111 Cystocele, midline: Secondary | ICD-10-CM

## 2018-09-12 NOTE — Progress Notes (Signed)
GYNECOLOGY  VISIT  CC:   Pessary check  HPI: 78 y.o. G45P1001 [name] here for pessary check.  Discharge has resolved.  Denies vaginal bleeding.  Denies urinary complaints.  Denies pelvic pain.    GYNECOLOGIC HISTORY: Patient's last menstrual period was 02/16/1988 (lmp unknown). Contraception: PMP Menopausal hormone therapy: none  Patient Active Problem List   Diagnosis Date Noted  . Intermittent palpitations 01/17/2017  . Right shoulder pain 12/14/2016  . Right carotid bruit 08/01/2015  . Uterine prolapse 06/13/2013  . Cystocele 06/13/2013  . Ex-cigarette smoker 05/04/2013  . Asthmatic bronchitis 05/04/2013  . 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 ESOPHAGEAL REFLUX 03/24/2010  . 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 COLONIC POLYPS 01/17/2008  . Hypothyroidism 01/17/2008    Past Medical History:  Diagnosis Date  . Colon polyps   . COPD (chronic obstructive pulmonary disease) ([place])   . History of chicken pox   . Hx of colonic polyps   . Hypercholesterolemia   . Hypothyroidism   . 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 MVP (mitral valve prolapse)   . Osteopenia   . Prolapsed uterus   . Varicose vein     Past Surgical History:  Procedure Laterality Date  . 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 Patient does not remember exact date of surgery.  [name] CATARACT EXTRACTION Right 10/25/2017  . [place]  . MELANOMA EXCISION  12/31/2010   Procedure: MELANOMA EXCISION;  Surgeon: Zenovia Jarred, MD;  Location: [facility];  Service: General;  Laterality: Left;  wide excision melanoma left thigh, excision lesion left thigh  . OTHER SURGICAL HISTORY Right    Surgical repair of architectual distortion Right Breast- Hyperplasia  . TONSILLECTOMY      MEDS:   Current Outpatient Medications on File Prior to Visit  Medication Sig Dispense Refill  . acyclovir ointment (ZOVIRAX) 5 % See admin instructions. Prn cold  sores  0  . aspirin 81 MG tablet Take 81 mg by mouth daily.      . Calcium Carbonate-Vitamin D (CALCIUM + D PO) Take by mouth daily.      . Cholecalciferol (VITAMIN D) 2000 UNITS CAPS Take 1 capsule by mouth daily.    . fluticasone (FLONASE) 50 MCG/ACT nasal spray PLACE 2 SPRAYS INTO BOTH NOSTRILS AS NEEDED. 16 g 5  . hydrocortisone 2.5 % cream Place 1 application rectally 4 times daily 30 g 1  . ketoconazole (NIZORAL) 2 % cream PATIENT IS TO MIX WITH HYDROCORTISONE CREAM! APPLY 2-3 TIMES PER DAY    . levothyroxine (SYNTHROID, LEVOTHROID) 88 MCG tablet Take 1 tablet (88 mcg total) by mouth daily before breakfast. 30 tablet 11  . LUTEIN PO Take 1 tablet by mouth daily.    . metroNIDAZOLE (METROGEL) 0.75 % vaginal gel Place 1 Applicatorful vaginally at bedtime. Use nightly for 5 nights. 70 g 0  . Multiple Vitamins-Minerals (PRESERVISION AREDS 2 PO) Take by mouth.    . nicotine polacrilex (NICORETTE) 2 MG gum Take 2 mg by mouth as needed. Not smoking    . NONFORMULARY OR COMPOUNDED ITEM Vaginal estradiol cream 0.02%.  1/2 gram per vaginal at bedtime twice weekly. 8 each 4  . valACYclovir (VALTREX) 1000 MG tablet 2 tabs (2gm) x 1, repeat in 12 hours with symptom onset 30 tablet 2   No current facility-administered medications on file prior to visit.     ALLERGIES: Codeine, Ciprofloxacin, and Sulfur  Family History  Problem Relation Age of Onset  . Hypertension Mother   . Congestive Heart Failure Mother   . Thyroid disease Mother   . Hyperlipidemia Mother   . Heart disease Mother   . Stroke Father   . Heart attack Father 37  . Diabetes Paternal Grandmother   . Diabetes Paternal Grandfather   . Hypertension Maternal Grandfather   . CVA Maternal Grandfather   . Esophageal cancer Neg Hx   . Rectal cancer Neg Hx   . Stomach cancer Neg Hx   . Colon cancer Neg Hx     SH:  Widowed, non smoker  Review of Systems  All other systems reviewed and are negative.   PHYSICAL EXAMINATION:     LMP 02/16/1988 (LMP Unknown)     General appearance: alert, cooperative and appears stated age Lymph:  no inguinal LAD noted  Pelvic: External genitalia:  no lesions              Urethra:  normal appearing urethra with no masses, tenderness or lesions              Bartholins and Skenes: normal                 Vagina: normal appearing vagina with normal color and discharge, no lesions, pessary removed, no lesions or ulcerations              Cervix: no lesions              Bimanual Exam:  Uterus:  normal size, contour, position, consistency, mobility, non-tender              Adnexa: no mass, fullness, tenderness  Chaperone was present for exam.  Assessment: Cystocele Incomplete uterine prolaspe Resolution of discharge  Plan: Recheck 1 month

## 2018-09-28 ENCOUNTER — Other Ambulatory Visit: Payer: Self-pay

## 2018-09-28 ENCOUNTER — Ambulatory Visit: Payer: Medicare Other | Admitting: Obstetrics and Gynecology

## 2018-09-28 ENCOUNTER — Encounter: Payer: Self-pay | Admitting: Obstetrics and Gynecology

## 2018-09-28 ENCOUNTER — Telehealth: Payer: Self-pay | Admitting: Obstetrics & Gynecology

## 2018-09-28 VITALS — BP 148/72 | HR 60 | Temp 97.7°F | Ht 62.0 in | Wt 117.0 lb

## 2018-09-28 DIAGNOSIS — N765 Ulceration of vagina: Secondary | ICD-10-CM | POA: Diagnosis not present

## 2018-09-28 DIAGNOSIS — N812 Incomplete uterovaginal prolapse: Secondary | ICD-10-CM

## 2018-09-28 NOTE — Progress Notes (Signed)
GYNECOLOGY  VISIT   HPI: 78 y.o.   Widowed  Caucasian  female   G1P1001 with Patient's last menstrual period was 02/16/1988 (lmp unknown).   here for spotting with pessary x 2 days, no bleeding today. Patient states she works out in pool 1 to 1.5 hours daily--mainly leg exercises.  She now has a dish pessary, which she thinks is holding well.  She has done well overall. She was treated with Metrogel by Dr. Sabra Heck starting 08/24/18.   Patient used Metrogel last night by mistake instead of estrogen cream. Bleeding has stopped.   GYNECOLOGIC HISTORY: Patient's last menstrual period was 02/16/1988 (lmp unknown). Contraception:  Postmenopausal Menopausal hormone therapy:  none Last mammogram: 01-02-18 Neg/density B/BiRads1 Last pap smear:         OB History    Gravida  1   Para  1   Term  1   Preterm      AB      Living  1     SAB      TAB      Ectopic      Multiple      Live Births                 Patient Active Problem List   Diagnosis Date Noted  . Intermittent palpitations 01/17/2017  . Right shoulder pain 12/14/2016  . Right carotid bruit 08/01/2015  . Uterine prolapse 06/13/2013  . Cystocele 06/13/2013  . Ex-cigarette smoker 05/04/2013  . Asthmatic bronchitis 05/04/2013  . 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 ESOPHAGEAL REFLUX 03/24/2010  . 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 COLONIC POLYPS 01/17/2008  . Hypothyroidism 01/17/2008    Past Medical History:  Diagnosis Date  . COPD (chronic obstructive pulmonary disease) ([place])   . History of chicken pox   . Hx of colonic polyps   . Hypercholesterolemia   . Hypothyroidism   . 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 MVP (mitral valve prolapse)   . Osteopenia   . Prolapsed uterus   . Varicose vein     Past Surgical History:  Procedure Laterality Date  . 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 Patient does not remember exact date of surgery.  [name] CATARACT EXTRACTION Right 10/25/2017   . [place]  . MELANOMA EXCISION  12/31/2010   Procedure: MELANOMA EXCISION;  Surgeon: Zenovia Jarred, MD;  Location: [facility];  Service: General;  Laterality: Left;  wide excision melanoma left thigh, excision lesion left thigh  . OTHER SURGICAL HISTORY Right    Surgical repair of architectual distortion Right Breast- Hyperplasia  . TONSILLECTOMY      Current Outpatient Medications  Medication Sig Dispense Refill  . acyclovir ointment (ZOVIRAX) 5 % See admin instructions. Prn cold sores  0  . aspirin 81 MG tablet Take 81 mg by mouth daily.      . Calcium Carbonate-Vitamin D (CALCIUM + D PO) Take by mouth daily.      . Cholecalciferol (VITAMIN D) 2000 UNITS CAPS Take 1 capsule by mouth daily.    . fluticasone (FLONASE) 50 MCG/ACT nasal spray PLACE 2 SPRAYS INTO BOTH NOSTRILS AS NEEDED. 16 g 5  . hydrocortisone 2.5 % cream Place 1 application rectally 4 times daily 30 g 1  . ketoconazole (NIZORAL) 2 % cream PATIENT IS TO MIX WITH HYDROCORTISONE CREAM! APPLY 2-3 TIMES PER DAY    . levothyroxine (SYNTHROID, LEVOTHROID) 88 MCG tablet  Take 1 tablet (88 mcg total) by mouth daily before breakfast. 30 tablet 11  . LUTEIN PO Take 1 tablet by mouth daily.    . metroNIDAZOLE (METROGEL) 0.75 % vaginal gel Place 1 Applicatorful vaginally at bedtime. Use nightly for 5 nights. 70 g 0  . Multiple Vitamins-Minerals (PRESERVISION AREDS 2 PO) Take by mouth.    . nicotine polacrilex (NICORETTE) 2 MG gum Take 2 mg by mouth as needed. Not smoking    . NONFORMULARY OR COMPOUNDED ITEM Vaginal estradiol cream 0.02%.  1/2 gram per vaginal at bedtime twice weekly. 8 each 4  . valACYclovir (VALTREX) 1000 MG tablet 2 tabs (2gm) x 1, repeat in 12 hours with symptom onset 30 tablet 2   No current facility-administered medications for this visit.      ALLERGIES: Codeine, Ciprofloxacin, and Sulfur  Family History  Problem Relation Age of Onset  . Hypertension Mother   . Congestive  Heart Failure Mother   . Thyroid disease Mother   . Hyperlipidemia Mother   . Heart disease Mother   . Stroke Father   . Heart attack Father 67  . Diabetes Paternal Grandmother   . Diabetes Paternal Grandfather   . Hypertension Maternal Grandfather   . CVA Maternal Grandfather   . Esophageal cancer Neg Hx   . Rectal cancer Neg Hx   . Stomach cancer Neg Hx   . Colon cancer Neg Hx     Social History   Socioeconomic History  . Marital status: Widowed    Spouse name: [place]  . Number of children: Not on file  . Years of education: Not on file  . Highest education level: Not on file  Occupational History  . Not on file  Social Needs  . Financial resource strain: Not on file  . Food insecurity    Worry: Not on file    Inability: Not on file  . Transportation needs    Medical: Not on file    Non-medical: Not on file  Tobacco Use  . Smoking status: Former Smoker    Packs/day: 1.00    Years: 50.00    Pack years: 50.00    Types: Cigarettes    Start date: 03/08/1960    Quit date: 07/23/2010    Years since quitting: 8.1  . Smokeless tobacco: Never Used  . Tobacco comment: quit smoking 3 years ago  Substance and Sexual Activity  . Alcohol use: No    Alcohol/week: 0.0 standard drinks  . Drug use: No  . Sexual activity: Not Currently    Partners: Male    Birth control/protection: Post-menopausal  Lifestyle  . Physical activity    Days per week: Not on file    Minutes per session: Not on file  . Stress: Not on file  Relationships  . Social Herbalist on phone: Not on file    Gets together: Not on file    Attends religious service: Not on file    Active member of club or organization: Not on file    Attends meetings of clubs or organizations: Not on file    Relationship status: Not on file  . Intimate partner violence    Fear of current or ex partner: Not on file    Emotionally abused: Not on file    Physically abused: Not on file    Forced sexual  activity: Not on file  Other Topics Concern  . Not on file  Social History Narrative  .  Not on file    Review of Systems  All other systems reviewed and are negative.   PHYSICAL EXAMINATION:    Ht 5\' 2"  (1.575 m)   Wt 117 lb (53.1 kg)   LMP 02/16/1988 (LMP Unknown)   BMI 21.40 kg/m     General appearance: alert, cooperative and appears stated age   Pelvic: External genitalia:  no lesions              Urethra:  normal appearing urethra with no masses, tenderness or lesions              Bartholins and Skenes: normal                 Vagina 2 cm ulceration of the posterior vaginal apex.  Bleeding.               Cervix: no lesions                Bimanual Exam:  Uterus:  normal size, contour, position, consistency, mobility, non-tender              Adnexa: no mass, fullness, tenderness           Pessary removed, cleansed, and placed in a biobag.   Chaperone was present for exam.  ASSESSMENT   Incomplete uterovaginal prolapse.  Vaginal ulceration.   PLAN  Keep pessary out for 2 weeks.  Use estradiol cream 1/2 gram pv at hs twice weekly. Ok to continue physical activity! We discussed use of a tampon or Impressa for a trial of a soft vaginal prolapse support choice.  This may also be very drying, and I do not recommend she try this right now with her ulceration present.  Fu in 2 weeks.  She has an appointment with Dr. Sabra Heck.    An After Visit Summary was printed and given to the patient.  ___15___ minutes face to face time of which over 50% was spent in counseling.

## 2018-09-28 NOTE — Telephone Encounter (Signed)
Patient is spotting and having a problem with the pessary.

## 2018-09-28 NOTE — Telephone Encounter (Signed)
Call reviewed with Dr. Quincy Simmonds, call returned to patient. OV scheduled for today at 1:15pm. Patient is aware she will be worked into schedule.   Encounter closed.

## 2018-09-28 NOTE — Telephone Encounter (Signed)
Spoke with patient. Hx of cystocele, new pessary placed 07/2018, hx of vaginal ulcerations. Patient reports spotting for the past 2 days, no bleeding today. Denies pain, vaginal odor, or urinary symptoms. Patient is unable to remove pessary on her own, requesting OV with Dr. Quincy Simmonds or Dr. Sabra Heck. Advised patient Dr. Sabra Heck is out of the office, will need to review with Dr. Quincy Simmonds and return call. Patient agreeable.

## 2018-09-28 NOTE — Patient Instructions (Signed)
Please keep your pessary out for 2 weeks. Use the vaginal estrogen cream 1/2 gram per vagina at bedtime twice a week until your next office visit.

## 2018-10-10 ENCOUNTER — Ambulatory Visit: Payer: Medicare Other | Admitting: Obstetrics & Gynecology

## 2018-10-11 ENCOUNTER — Other Ambulatory Visit: Payer: Self-pay

## 2018-10-11 ENCOUNTER — Telehealth: Payer: Self-pay | Admitting: Obstetrics & Gynecology

## 2018-10-11 ENCOUNTER — Ambulatory Visit: Payer: Medicare Other | Admitting: Obstetrics & Gynecology

## 2018-10-11 ENCOUNTER — Encounter: Payer: Self-pay | Admitting: Obstetrics & Gynecology

## 2018-10-11 VITALS — BP 122/68 | HR 64 | Temp 97.2°F | Resp 12 | Ht 62.0 in | Wt 117.0 lb

## 2018-10-11 DIAGNOSIS — N812 Incomplete uterovaginal prolapse: Secondary | ICD-10-CM | POA: Diagnosis not present

## 2018-10-11 DIAGNOSIS — N8111 Cystocele, midline: Secondary | ICD-10-CM | POA: Diagnosis not present

## 2018-10-11 NOTE — Telephone Encounter (Signed)
Patient needing to schedule a 1 month pessary recheck with Dr Sabra Heck.

## 2018-10-11 NOTE — Telephone Encounter (Signed)
Call to patient. Patient scheduled for one month follow up with Dr. Sabra Heck on 11-10-2018 at 1330. Patient agreeable to date and time of appointment.   Encounter closed.

## 2018-10-11 NOTE — Progress Notes (Signed)
78 y.o. Widowed White female G1P1001 here for pessary check.  She reports no vaginal bleeding or vaginal discharge.  She is not sexually active.  Has a little more trouble with voiding when the pessary is out.    She is frustrated about the recurrent issues with pessary use.  D/w pt surgical treatment including TVH/anterior repair, possible LAVH.  BSO would be done if possible.  Procedure, hospital stay, recovery all discussed.    Past Medical History:  Diagnosis Date  . COPD (chronic obstructive pulmonary disease) ([place])   . History of chicken pox   . Hx of colonic polyps   . Hypercholesterolemia   . Hypothyroidism   . 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 MVP (mitral valve prolapse)   . Osteopenia   . Prolapsed uterus   . Varicose vein    Allergies  Allergen Reactions  . Codeine Nausea And Vomiting  . Ciprofloxacin     Musculskeletal pain.  . Sulfur Nausea Only   Current Outpatient Medications on File Prior to Visit  Medication Sig Dispense Refill  . acyclovir ointment (ZOVIRAX) 5 % See admin instructions. Prn cold sores  0  . aspirin 81 MG tablet Take 81 mg by mouth daily.      . Calcium Carbonate-Vitamin D (CALCIUM + D PO) Take by mouth daily.      . Cholecalciferol (VITAMIN D) 2000 UNITS CAPS Take 1 capsule by mouth daily.    . fluticasone (FLONASE) 50 MCG/ACT nasal spray PLACE 2 SPRAYS INTO BOTH NOSTRILS AS NEEDED. 16 g 5  . hydrocortisone 2.5 % cream Place 1 application rectally 4 times daily 30 g 1  . ketoconazole (NIZORAL) 2 % cream PATIENT IS TO MIX WITH HYDROCORTISONE CREAM! APPLY 2-3 TIMES PER DAY    . levothyroxine (SYNTHROID, LEVOTHROID) 88 MCG tablet Take 1 tablet (88 mcg total) by mouth daily before breakfast. 30 tablet 11  . LUTEIN PO Take 1 tablet by mouth daily.    . metroNIDAZOLE (METROGEL) 0.75 % vaginal gel Place 1 Applicatorful vaginally at bedtime. Use nightly for 5 nights. 70 g 0  . Multiple Vitamins-Minerals (PRESERVISION AREDS 2 PO) Take by mouth.    .  nicotine polacrilex (NICORETTE) 2 MG gum Take 2 mg by mouth as needed. Not smoking    . NONFORMULARY OR COMPOUNDED ITEM Vaginal estradiol cream 0.02%.  1/2 gram per vaginal at bedtime twice weekly. 8 each 4  . valACYclovir (VALTREX) 1000 MG tablet 2 tabs (2gm) x 1, repeat in 12 hours with symptom onset 30 tablet 2   No current facility-administered medications on file prior to visit.      Exam:   BP 122/68 (BP Location: Right Arm, Patient Position: Sitting, Cuff Size: Normal)   Pulse 64   Temp (!) 97.2 F (36.2 C) (Temporal)   Resp 12   Ht 5\' 2"  (1.575 m)   Wt 117 lb (53.1 kg)   LMP 02/16/1988 (LMP Unknown)   BMI 21.40 kg/m   General appearance: alert, cooperative and no distress Inguinal lymph nodes:  not enlarged  Pelvic: External genitalia:  no lesions              Urethra: not indicated              Bartholins and Skenes: normal                 Vagina: normal appearing vagina with normal color and discharge, no lesions, no ulcerations present  Cervix: normal appearance   Pap obtained:  no Bimanual Exam:  Uterus:  uterus is normal size, shape, consistency and nontender, incomplete uterine prolapse, cystocele noted                               Adnexa:    not indicated  Pessary was placed without difficulty.  Patient tolerated procedure well.    A: Cystocele- symptomatic      Incomplete uterine prolapse  P:  Return 1 month, per pt's request She is going to consider surgical options.  ~20 minutes spent with patient >50% of time was in face to face discussion of above.

## 2018-10-18 ENCOUNTER — Ambulatory Visit: Payer: Medicare Other | Admitting: Obstetrics & Gynecology

## 2018-11-09 ENCOUNTER — Other Ambulatory Visit: Payer: Self-pay

## 2018-11-10 ENCOUNTER — Ambulatory Visit: Payer: Medicare Other | Admitting: Obstetrics & Gynecology

## 2018-11-10 ENCOUNTER — Encounter: Payer: Self-pay | Admitting: Obstetrics & Gynecology

## 2018-11-10 VITALS — BP 136/70 | HR 68 | Temp 97.3°F | Ht 62.0 in | Wt 117.0 lb

## 2018-11-10 DIAGNOSIS — N765 Ulceration of vagina: Secondary | ICD-10-CM

## 2018-11-10 DIAGNOSIS — N812 Incomplete uterovaginal prolapse: Secondary | ICD-10-CM

## 2018-11-10 NOTE — Progress Notes (Signed)
GYNECOLOGY  VISIT  CC:   Pessary check   HPI: 78 y.o. G49P1001 [name] here for pessary check.  Denies vaginal bleeding or discharge.  Emptying bladder normally.  GYNECOLOGIC HISTORY: Patient's last menstrual period was 02/16/1988 (lmp unknown). Contraception: PMP Menopausal hormone therapy: estradiol cream   Patient Active Problem List   Diagnosis Date Noted  . Intermittent palpitations 01/17/2017  . Right shoulder pain 12/14/2016  . Right carotid bruit 08/01/2015  . Uterine prolapse 06/13/2013  . Cystocele 06/13/2013  . Ex-cigarette smoker 05/04/2013  . Asthmatic bronchitis 05/04/2013  . 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 ESOPHAGEAL REFLUX 03/24/2010  . 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 COLONIC POLYPS 01/17/2008  . Hypothyroidism 01/17/2008    Past Medical History:  Diagnosis Date  . COPD (chronic obstructive pulmonary disease) ([place])   . History of chicken pox   . Hx of colonic polyps   . Hypercholesterolemia   . Hypothyroidism   . 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 MVP (mitral valve prolapse)   . Osteopenia   . Prolapsed uterus   . Varicose vein     Past Surgical History:  Procedure Laterality Date  . 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 Patient does not remember exact date of surgery.  [name] CATARACT EXTRACTION Right 10/25/2017  . [place]  . MELANOMA EXCISION  12/31/2010   Procedure: MELANOMA EXCISION;  Surgeon: Zenovia Jarred, MD;  Location: [facility];  Service: General;  Laterality: Left;  wide excision melanoma left thigh, excision lesion left thigh  . OTHER SURGICAL HISTORY Right    Surgical repair of architectual distortion Right Breast- Hyperplasia  . TONSILLECTOMY      MEDS:   Current Outpatient Medications on File Prior to Visit  Medication Sig Dispense Refill  . acyclovir ointment (ZOVIRAX) 5 % See admin instructions. Prn cold sores  0  . aspirin 81 MG tablet Take 81  mg by mouth once a week.     . Calcium Carbonate-Vitamin D (CALCIUM + D PO) Take by mouth daily.      . fluticasone (FLONASE) 50 MCG/ACT nasal spray PLACE 2 SPRAYS INTO BOTH NOSTRILS AS NEEDED. 16 g 5  . hydrocortisone 2.5 % cream Place 1 application rectally 4 times daily 30 g 1  . ketoconazole (NIZORAL) 2 % cream PATIENT IS TO MIX WITH HYDROCORTISONE CREAM! APPLY 2-3 TIMES PER DAY    . levothyroxine (SYNTHROID, LEVOTHROID) 88 MCG tablet Take 1 tablet (88 mcg total) by mouth daily before breakfast. 30 tablet 11  . LUTEIN PO Take 1 tablet by mouth daily.    . Multiple Vitamins-Minerals (PRESERVISION AREDS 2 PO) Take by mouth.    . nicotine polacrilex (NICORETTE) 2 MG gum Take 2 mg by mouth as needed. Not smoking    . NONFORMULARY OR COMPOUNDED ITEM Vaginal estradiol cream 0.02%.  1/2 gram per vaginal at bedtime twice weekly. 8 each 4  . valACYclovir (VALTREX) 1000 MG tablet 2 tabs (2gm) x 1, repeat in 12 hours with symptom onset 30 tablet 2  . Cholecalciferol (VITAMIN D) 2000 UNITS CAPS Take 1 capsule by mouth daily.     No current facility-administered medications on file prior to visit.     ALLERGIES: Codeine, Ciprofloxacin, and Sulfur  Family History  Problem Relation Age of Onset  . Hypertension Mother   . Congestive Heart Failure Mother   . Thyroid disease Mother   . Hyperlipidemia Mother   .  Heart disease Mother   . Stroke Father   . Heart attack Father 75  . Diabetes Paternal Grandmother   . Diabetes Paternal Grandfather   . Hypertension Maternal Grandfather   . CVA Maternal Grandfather   . Esophageal cancer Neg Hx   . Rectal cancer Neg Hx   . Stomach cancer Neg Hx   . Colon cancer Neg Hx     SH:  Widowed, non smoker  Review of Systems  All other systems reviewed and are negative.   PHYSICAL EXAMINATION:    BP 136/70   Pulse 68   Temp (!) 97.3 F (36.3 C) (Temporal)   Ht 5\' 2"  (1.575 m)   Wt 117 lb (53.1 kg)   LMP 02/16/1988 (LMP Unknown)   BMI 21.40 kg/m      General appearance: alert, cooperative and appears stated age Lymph:  no inguinal LAD noted  Pelvic: External genitalia:  no lesions              Urethra:  normal appearing urethra with no masses, tenderness or lesions              Bartholins and Skenes: normal                 Vagina: normal appearing vagina with normal color and discharge, no lesions              Cervix: no lesions              Bimanual Exam:  Uterus:  normal size, contour, position, consistency, mobility, non-tender              Adnexa: no mass, fullness, tenderness  Pessary removed wihtout difficulty.  Pressure ulceration beneath cervix present again.  Pessary left out. Estrogen cream applied.  Chaperone was present for exam.  Assessment: Incomplete uterine prolapse Vaginal ulceration from pessary  Plan: Pt will return in two weeks for pessary placement.  Will consider using the ring with support again as this seemed to work a little better.

## 2018-11-22 ENCOUNTER — Other Ambulatory Visit: Payer: Self-pay | Admitting: Geriatric Medicine

## 2018-11-22 DIAGNOSIS — F17211 Nicotine dependence, cigarettes, in remission: Secondary | ICD-10-CM

## 2018-11-23 ENCOUNTER — Ambulatory Visit: Payer: Medicare Other | Admitting: Obstetrics & Gynecology

## 2018-11-23 ENCOUNTER — Encounter: Payer: Self-pay | Admitting: Gynecology

## 2018-12-01 ENCOUNTER — Other Ambulatory Visit: Payer: Self-pay

## 2018-12-05 ENCOUNTER — Ambulatory Visit: Payer: Medicare Other | Admitting: Obstetrics & Gynecology

## 2018-12-05 ENCOUNTER — Encounter: Payer: Self-pay | Admitting: Obstetrics & Gynecology

## 2018-12-05 VITALS — BP 126/70 | HR 64 | Temp 97.1°F | Ht 62.0 in | Wt 116.8 lb

## 2018-12-05 DIAGNOSIS — N812 Incomplete uterovaginal prolapse: Secondary | ICD-10-CM

## 2018-12-05 DIAGNOSIS — Z4689 Encounter for fitting and adjustment of other specified devices: Secondary | ICD-10-CM | POA: Diagnosis not present

## 2018-12-05 DIAGNOSIS — N8111 Cystocele, midline: Secondary | ICD-10-CM

## 2018-12-05 MED ORDER — HYDROCORTISONE 2.5 % EX CREA: TOPICAL_CREAM | CUTANEOUS | 1 refills | Status: DC

## 2018-12-05 NOTE — Progress Notes (Signed)
GYNECOLOGY  VISIT  CC:   Pessary f/u  HPI: 78 y.o. G59P1001 [name] here for pessary check.  Discharge and bleeding has stopped.  We discussed replacing pessary with incontinence ring instead of dish pessary.  She is in agreement.  Has not had bladder issues with this current pessary.  Has some questions about Covid and death of her brother and his upcoming funeral.  Answered these to the best of my ability with most up to date information I know and with current guideline recommendations.    Has right groin yeast that she's been treating with ketoconazole 2% cream.  Wants me to look at it today.  Feels it has significantly improved.   GYNECOLOGIC HISTORY: Patient's last menstrual period was 02/16/1988 (lmp unknown). Contraception: PMP Menopausal hormone therapy: none  Patient Active Problem List   Diagnosis Date Noted  . Intermittent palpitations 01/17/2017  . Right shoulder pain 12/14/2016  . Right carotid bruit 08/01/2015  . Uterine prolapse 06/13/2013  . Cystocele 06/13/2013  . Ex-cigarette smoker 05/04/2013  . Asthmatic bronchitis 05/04/2013  . 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 ESOPHAGEAL REFLUX 03/24/2010  . 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 COLONIC POLYPS 01/17/2008  . Hypothyroidism 01/17/2008    Past Medical History:  Diagnosis Date  . COPD (chronic obstructive pulmonary disease) ([place])   . History of chicken pox   . Hx of colonic polyps   . Hypercholesterolemia   . Hypothyroidism   . 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 MVP (mitral valve prolapse)   . Osteopenia   . Prolapsed uterus   . Varicose vein     Past Surgical History:  Procedure Laterality Date  . 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 Patient does not remember exact date of surgery.  [name] CATARACT EXTRACTION Right 10/25/2017  . [place]  . MELANOMA EXCISION  12/31/2010   Procedure: MELANOMA EXCISION;  Surgeon: Zenovia Jarred, MD;   Location: [facility];  Service: General;  Laterality: Left;  wide excision melanoma left thigh, excision lesion left thigh  . OTHER SURGICAL HISTORY Right    Surgical repair of architectual distortion Right Breast- Hyperplasia  . TONSILLECTOMY      MEDS:   Current Outpatient Medications on File Prior to Visit  Medication Sig Dispense Refill  . acyclovir ointment (ZOVIRAX) 5 % See admin instructions. Prn cold sores  0  . aspirin 81 MG tablet Take 81 mg by mouth once a week.     . Calcium Carbonate-Vitamin D (CALCIUM + D PO) Take by mouth daily.      . Cholecalciferol (VITAMIN D) 2000 UNITS CAPS Take 1 capsule by mouth daily.    . fluticasone (FLONASE) 50 MCG/ACT nasal spray PLACE 2 SPRAYS INTO BOTH NOSTRILS AS NEEDED. 16 g 5  . hydrocortisone 2.5 % cream Place 1 application rectally 4 times daily 30 g 1  . ketoconazole (NIZORAL) 2 % cream PATIENT IS TO MIX WITH HYDROCORTISONE CREAM! APPLY 2-3 TIMES PER DAY    . levothyroxine (SYNTHROID, LEVOTHROID) 88 MCG tablet Take 1 tablet (88 mcg total) by mouth daily before breakfast. 30 tablet 11  . LUTEIN PO Take 1 tablet by mouth daily.    . Multiple Vitamins-Minerals (PRESERVISION AREDS 2 PO) Take by mouth.    . nicotine polacrilex (NICORETTE) 2 MG gum Take 2 mg by mouth as needed. Not smoking    . NONFORMULARY OR COMPOUNDED ITEM Vaginal estradiol  cream 0.02%.  1/2 gram per vaginal at bedtime twice weekly. 8 each 4  . valACYclovir (VALTREX) 1000 MG tablet 2 tabs (2gm) x 1, repeat in 12 hours with symptom onset 30 tablet 2   No current facility-administered medications on file prior to visit.     ALLERGIES: Codeine, Ciprofloxacin, and Sulfur  Family History  Problem Relation Age of Onset  . Hypertension Mother   . Congestive Heart Failure Mother   . Thyroid disease Mother   . Hyperlipidemia Mother   . Heart disease Mother   . Stroke Father   . Heart attack Father 50  . Diabetes Paternal Grandmother   . Diabetes Paternal  Grandfather   . Hypertension Maternal Grandfather   . CVA Maternal Grandfather   . Esophageal cancer Neg Hx   . Rectal cancer Neg Hx   . Stomach cancer Neg Hx   . Colon cancer Neg Hx     SH:  Widowed, non smoker  Review of Systems  All other systems reviewed and are negative.   PHYSICAL EXAMINATION:    BP 126/70   Pulse 64   Temp (!) 97.1 F (36.2 C) (Temporal)   Ht 5\' 2"  (1.575 m)   Wt 116 lb 12.8 oz (53 kg)   LMP 02/16/1988 (LMP Unknown)   BMI 21.36 kg/m     General appearance: alert, cooperative and appears stated age Lymph:  no inguinal LAD noted  Pelvic: External genitalia:  no lesions              Urethra:  normal appearing urethra with no masses, tenderness or lesions              Bartholins and Skenes: normal                 Vagina: normal appearing vagina with normal color and discharge, no lesions              Cervix: no lesions              Bimanual Exam:  Uterus:  normal size, contour, position, consistency, mobility, non-tender              Adnexa: no mass, fullness, tenderness  #3 incontinence ring with support placed without difficulty.  Chaperone was present for exam.  Assessment: Cystocele with incomplete uterine prolapse  Plan: Recheck 1 month.  She knows to call with any issues/problems RF for hydrocortisone RF done for pt

## 2019-01-02 ENCOUNTER — Other Ambulatory Visit: Payer: Self-pay

## 2019-01-03 ENCOUNTER — Telehealth: Payer: Self-pay | Admitting: *Deleted

## 2019-01-03 NOTE — Telephone Encounter (Signed)
Spoke with patient. Pessary recheck r/s to 11/20 at 3:30pm with Dr. Sabra Heck.   Encounter closed.

## 2019-01-03 NOTE — Telephone Encounter (Signed)
Patient's pessary recheck was cancelled because provider will not be in office. Sending to triage for assistance with scheduling.

## 2019-01-04 ENCOUNTER — Ambulatory Visit: Payer: Medicare Other | Admitting: Obstetrics & Gynecology

## 2019-01-05 ENCOUNTER — Other Ambulatory Visit: Payer: Self-pay

## 2019-01-05 ENCOUNTER — Ambulatory Visit: Payer: Self-pay | Admitting: Obstetrics & Gynecology

## 2019-01-05 ENCOUNTER — Ambulatory Visit: Payer: Medicare Other | Admitting: Obstetrics and Gynecology

## 2019-01-05 VITALS — BP 134/70 | HR 67 | Temp 98.6°F | Ht 62.0 in | Wt 116.8 lb

## 2019-01-05 DIAGNOSIS — Z4689 Encounter for fitting and adjustment of other specified devices: Secondary | ICD-10-CM

## 2019-01-05 DIAGNOSIS — N812 Incomplete uterovaginal prolapse: Secondary | ICD-10-CM | POA: Diagnosis not present

## 2019-01-05 NOTE — Progress Notes (Signed)
GYNECOLOGY  VISIT   HPI: 78 y.o.   Widowed  Caucasian  female   G1P1001 with Patient's last menstrual period was 02/16/1988 (lmp unknown).   here for  Pessary check.  She is doing well back with a pessary ring with support and really likes it.   No spotting.  No discharge.  No odor.   Empties better with her pessary in place.  Does not need vaginal estrogen cream refill.   Having some stress due to a neighbor with mental illness who is repeatedly knocking on neighborhood doors including hers during night time hours.  She has called 911 to get assistance in these circumstances.  GYNECOLOGIC HISTORY: Patient's last menstrual period was 02/16/1988 (lmp unknown). Contraception:  Post Menopausal  Menopausal hormone therapy:  estadiol  Last mammogram:  01/02/18 Last pap smear:   2012 normal        OB History    Gravida  1   Para  1   Term  1   Preterm      AB      Living  1     SAB      TAB      Ectopic      Multiple      Live Births                 Patient Active Problem List   Diagnosis Date Noted  . Intermittent palpitations 01/17/2017  . Right shoulder pain 12/14/2016  . Right carotid bruit 08/01/2015  . Uterine prolapse 06/13/2013  . Cystocele 06/13/2013  . Ex-cigarette smoker 05/04/2013  . Asthmatic bronchitis 05/04/2013  . 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 ESOPHAGEAL REFLUX 03/24/2010  . 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 COLONIC POLYPS 01/17/2008  . Hypothyroidism 01/17/2008    Past Medical History:  Diagnosis Date  . COPD (chronic obstructive pulmonary disease) ([facility])   . History of chicken pox   . Hx of colonic polyps   . Hypercholesterolemia   . Hypothyroidism   . 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 MVP (mitral valve prolapse)   . Osteopenia   . Prolapsed uterus   . Varicose vein     Past Surgical History:  Procedure Laterality Date  . 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 Patient does not remember exact  date of surgery.  [name] CATARACT EXTRACTION Right 10/25/2017  . [place]  . MELANOMA EXCISION  12/31/2010   Procedure: MELANOMA EXCISION;  Surgeon: Zenovia Jarred, MD;  Location: [facility];  Service: General;  Laterality: Left;  wide excision melanoma left thigh, excision lesion left thigh  . OTHER SURGICAL HISTORY Right    Surgical repair of architectual distortion Right Breast- Hyperplasia  . TONSILLECTOMY      Current Outpatient Medications  Medication Sig Dispense Refill  . acyclovir ointment (ZOVIRAX) 5 % See admin instructions. Prn cold sores  0  . aspirin 81 MG tablet Take 81 mg by mouth once a week.     . Calcium Carbonate-Vitamin D (CALCIUM + D PO) Take by mouth daily.      . Cholecalciferol (VITAMIN D) 2000 UNITS CAPS Take 1 capsule by mouth daily.    . fluticasone (FLONASE) 50 MCG/ACT nasal spray PLACE 2 SPRAYS INTO BOTH NOSTRILS AS NEEDED. 16 g 5  . hydrocortisone 2.5 % cream Place 1 application rectally 4 times daily 30 g 1  . ketoconazole (NIZORAL) 2 % cream PATIENT IS TO  MIX WITH HYDROCORTISONE CREAM! APPLY 2-3 TIMES PER DAY    . levothyroxine (SYNTHROID, LEVOTHROID) 88 MCG tablet Take 1 tablet (88 mcg total) by mouth daily before breakfast. 30 tablet 11  . LUTEIN PO Take 1 tablet by mouth daily.    . Multiple Vitamins-Minerals (PRESERVISION AREDS 2 PO) Take by mouth.    . nicotine polacrilex (NICORETTE) 2 MG gum Take 2 mg by mouth as needed. Not smoking    . NONFORMULARY OR COMPOUNDED ITEM Vaginal estradiol cream 0.02%.  1/2 gram per vaginal at bedtime twice weekly. 8 each 4  . valACYclovir (VALTREX) 1000 MG tablet 2 tabs (2gm) x 1, repeat in 12 hours with symptom onset 30 tablet 2   No current facility-administered medications for this visit.      ALLERGIES: Codeine, Ciprofloxacin, and Sulfur  Family History  Problem Relation Age of Onset  . Hypertension Mother   . Congestive Heart Failure Mother   . Thyroid disease Mother   .  Hyperlipidemia Mother   . Heart disease Mother   . Stroke Father   . Heart attack Father 44  . Diabetes Paternal Grandmother   . Diabetes Paternal Grandfather   . Hypertension Maternal Grandfather   . CVA Maternal Grandfather   . Esophageal cancer Neg Hx   . Rectal cancer Neg Hx   . Stomach cancer Neg Hx   . Colon cancer Neg Hx     Social History   Socioeconomic History  . Marital status: Widowed    Spouse name: fred Dena  . Number of children: Not on file  . Years of education: Not on file  . Highest education level: Not on file  Occupational History  . Not on file  Social Needs  . Financial resource strain: Not on file  . Food insecurity    Worry: Not on file    Inability: Not on file  . Transportation needs    Medical: Not on file    Non-medical: Not on file  Tobacco Use  . Smoking status: Former Smoker    Packs/day: 1.00    Years: 50.00    Pack years: 50.00    Types: Cigarettes    Start date: 03/08/1960    Quit date: 07/23/2010    Years since quitting: 8.4  . Smokeless tobacco: Never Used  . Tobacco comment: quit smoking 3 years ago  Substance and Sexual Activity  . Alcohol use: No    Alcohol/week: 0.0 standard drinks  . Drug use: No  . Sexual activity: Not Currently    Partners: Male    Birth control/protection: Post-menopausal  Lifestyle  . Physical activity    Days per week: Not on file    Minutes per session: Not on file  . Stress: Not on file  Relationships  . Social Herbalist on phone: Not on file    Gets together: Not on file    Attends religious service: Not on file    Active member of club or organization: Not on file    Attends meetings of clubs or organizations: Not on file    Relationship status: Not on file  . Intimate partner violence    Fear of current or ex partner: Not on file    Emotionally abused: Not on file    Physically abused: Not on file    Forced sexual activity: Not on file  Other Topics Concern  . Not on file   Social History Narrative  . Not on file  Review of Systems  All other systems reviewed and are negative.   PHYSICAL EXAMINATION:    BP 134/70   Pulse 67   Temp 98.6 F (37 C)   Ht 5\' 2"  (1.575 m)   Wt 116 lb 12.8 oz (53 kg)   LMP 02/16/1988 (LMP Unknown)   SpO2 98%   BMI 21.36 kg/m     General appearance: alert, cooperative and appears stated age    Pelvic: External genitalia:  no lesions              Urethra:  normal appearing urethra with no masses, tenderness or lesions              Bartholins and Skenes: normal                 Vagina: normal appearing vagina with normal color and discharge, no lesions.  Very minimal erythema of the vaginal apex - almost nothing.               Cervix: no lesions                Bimanual Exam:  Uterus:  normal size, contour, position, consistency, mobility, non-tender              Adnexa: no mass, fullness, tenderness              Pessary ring with support removed, cleansed, and replaced.   Chaperone was present for exam.  ASSESSMENT  Uterovaginal prolapse.  Pessary maintenance. Doing well.   PLAN  Continue pessary use.  Return for a recheck in 5-6 weeks, sooner if needed.  She will call if she has bleeding or foul smelling discharge.  Continue with vaginal estrogen cream.    An After Visit Summary was printed and given to the patient.  ___15___ minutes face to face time of which over 50% was spent in counseling.

## 2019-01-07 ENCOUNTER — Encounter: Payer: Self-pay | Admitting: Obstetrics and Gynecology

## 2019-01-08 ENCOUNTER — Encounter: Payer: Self-pay | Admitting: Obstetrics and Gynecology

## 2019-01-10 ENCOUNTER — Telehealth: Payer: Self-pay | Admitting: Obstetrics and Gynecology

## 2019-01-10 NOTE — Telephone Encounter (Signed)
Please let patient know I received a copy of her BMD at [facility] ordered by Dr. Felipa Eth.   She has osteopenia of her hips.  Her spine is normal.   Her last BMD was done with her PCP at [facility], so it is hard to compare the two of them.  I recommend weight bearing exercise, calcium 1200 mg daily in divided dosages, and vit D at least 600 -800 IU daily.   She will need a repeat BMD in 2 years.

## 2019-01-15 NOTE — Telephone Encounter (Signed)
Left message to call Tredarius Cobern, RN at [facility] [phone].   

## 2019-01-17 NOTE — Telephone Encounter (Signed)
Spoke with patient, advised of results as seen below per Dr. Quincy Simmonds. Patient is waiting to schedule a virtual visit with her PCP to discuss BMD results. Patient verbalizes understanding and is thankful for the call. Patient reports that she discovered this week that she has only been taking Calcium 200 mg daily for at least the past year, she has increased this to 600 mg bid. Plans to further discuss at [place] on 02/13/19.   Routing to provider for final review. Patient is agreeable to disposition. Will close encounter.

## 2019-02-02 ENCOUNTER — Telehealth: Payer: Self-pay | Admitting: Obstetrics and Gynecology

## 2019-02-02 ENCOUNTER — Other Ambulatory Visit: Payer: Self-pay

## 2019-02-02 ENCOUNTER — Ambulatory Visit: Payer: Medicare Other | Admitting: Obstetrics and Gynecology

## 2019-02-02 ENCOUNTER — Encounter: Payer: Self-pay | Admitting: Obstetrics and Gynecology

## 2019-02-02 VITALS — BP 128/64 | HR 77 | Temp 97.3°F | Ht 62.0 in | Wt 114.6 lb

## 2019-02-02 DIAGNOSIS — N765 Ulceration of vagina: Secondary | ICD-10-CM | POA: Diagnosis not present

## 2019-02-02 DIAGNOSIS — M85859 Other specified disorders of bone density and structure, unspecified thigh: Secondary | ICD-10-CM

## 2019-02-02 DIAGNOSIS — Z4689 Encounter for fitting and adjustment of other specified devices: Secondary | ICD-10-CM

## 2019-02-02 DIAGNOSIS — N812 Incomplete uterovaginal prolapse: Secondary | ICD-10-CM

## 2019-02-02 NOTE — Telephone Encounter (Signed)
Spoke to pt. Pt states having discharge that started 3-4 days with odor only. Denies burning or itching. Pt wants to come early than pessary check appt on 02/13/19 since having discharge. Pt scheduled for today 02/02/19 at 2pm with Dr Quincy Simmonds. Pt agreeable. CPS negative.  Will route to Dr Quincy Simmonds for review and will close encounter.

## 2019-02-02 NOTE — Telephone Encounter (Signed)
Patient has a discharge and her pessary need to come out.

## 2019-02-02 NOTE — Progress Notes (Signed)
GYNECOLOGY  VISIT   HPI: 78 y.o.   Widowed  Caucasian  female   G1P1001 with Patient's last menstrual period was 02/16/1988 (lmp unknown).   here for   Vaginal discharge for about 3-4 days.   Noticing odor.   She did use some estrogen cream this am.   She is wanting to remove pessary today and then replace on 12/29.  Her PCP recommended Fosamax and she is declining this. Walks, did over 5 miles yesterday, and she feels some discomfort in her left hip with long walks.  She takes vit D and calcium.   She wants to do physical therapy instead.  She does have a history of a left hip injury.   GYNECOLOGIC HISTORY: Patient's last menstrual period was 02/16/1988 (lmp unknown). Contraception:  PMP Menopausal hormone therapy:  Estradiol  Last mammogram:  01/08/2019 Last pap smear:           OB History    Gravida  1   Para  1   Term  1   Preterm      AB      Living  1     SAB      TAB      Ectopic      Multiple      Live Births                 Patient Active Problem List   Diagnosis Date Noted  . Intermittent palpitations 01/17/2017  . Right shoulder pain 12/14/2016  . Right carotid bruit 08/01/2015  . Uterine prolapse 06/13/2013  . Cystocele 06/13/2013  . Ex-cigarette smoker 05/04/2013  . Asthmatic bronchitis 05/04/2013  . 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 ESOPHAGEAL REFLUX 03/24/2010  . 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 COLONIC POLYPS 01/17/2008  . Hypothyroidism 01/17/2008    Past Medical History:  Diagnosis Date  . COPD (chronic obstructive pulmonary disease) ([place])   . History of chicken pox   . Hx of colonic polyps   . Hypercholesterolemia   . Hypothyroidism   . 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 MVP (mitral valve prolapse)   . Osteopenia   . Prolapsed uterus   . Varicose vein     Past Surgical History:  Procedure Laterality Date  . 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 Patient does not remember exact date of  surgery.  [name] CATARACT EXTRACTION Right 10/25/2017  . [place]  . MELANOMA EXCISION  12/31/2010   Procedure: MELANOMA EXCISION;  Surgeon: Zenovia Jarred, MD;  Location: [facility];  Service: General;  Laterality: Left;  wide excision melanoma left thigh, excision lesion left thigh  . OTHER SURGICAL HISTORY Right    Surgical repair of architectual distortion Right Breast- Hyperplasia  . TONSILLECTOMY      Current Outpatient Medications  Medication Sig Dispense Refill  . acyclovir ointment (ZOVIRAX) 5 % See admin instructions. Prn cold sores  0  . aspirin 81 MG tablet Take 81 mg by mouth once a week.     . Calcium Carbonate-Vitamin D (CALCIUM + D PO) Take by mouth daily.      . Cholecalciferol (VITAMIN D) 2000 UNITS CAPS Take 1 capsule by mouth daily.    . fluticasone (FLONASE) 50 MCG/ACT nasal spray PLACE 2 SPRAYS INTO BOTH NOSTRILS AS NEEDED. 16 g 5  . hydrocortisone 2.5 % cream Place 1 application rectally 4 times daily 30 g 1  . ketoconazole (  NIZORAL) 2 % cream PATIENT IS TO MIX WITH HYDROCORTISONE CREAM! APPLY 2-3 TIMES PER DAY    . levothyroxine (SYNTHROID, LEVOTHROID) 88 MCG tablet Take 1 tablet (88 mcg total) by mouth daily before breakfast. 30 tablet 11  . LUTEIN PO Take 1 tablet by mouth daily.    . Multiple Vitamins-Minerals (PRESERVISION AREDS 2 PO) Take by mouth.    . nicotine polacrilex (NICORETTE) 2 MG gum Take 2 mg by mouth as needed. Not smoking    . NONFORMULARY OR COMPOUNDED ITEM Vaginal estradiol cream 0.02%.  1/2 gram per vaginal at bedtime twice weekly. 8 each 4  . valACYclovir (VALTREX) 1000 MG tablet 2 tabs (2gm) x 1, repeat in 12 hours with symptom onset 30 tablet 2   No current facility-administered medications for this visit.     ALLERGIES: Codeine, Ciprofloxacin, and Sulfur  Family History  Problem Relation Age of Onset  . Hypertension Mother   . Congestive Heart Failure Mother   . Thyroid disease Mother   . Hyperlipidemia  Mother   . Heart disease Mother   . Stroke Father   . Heart attack Father 79  . Diabetes Paternal Grandmother   . Diabetes Paternal Grandfather   . Hypertension Maternal Grandfather   . CVA Maternal Grandfather   . Esophageal cancer Neg Hx   . Rectal cancer Neg Hx   . Stomach cancer Neg Hx   . Colon cancer Neg Hx     Social History   Socioeconomic History  . Marital status: Widowed    Spouse name: fred [place]  . Number of children: Not on file  . Years of education: Not on file  . Highest education level: Not on file  Occupational History  . Not on file  Tobacco Use  . Smoking status: Former Smoker    Packs/day: 1.00    Years: 50.00    Pack years: 50.00    Types: Cigarettes    Start date: 03/08/1960    Quit date: 07/23/2010    Years since quitting: 8.5  . Smokeless tobacco: Never Used  . Tobacco comment: quit smoking 3 years ago  Substance and Sexual Activity  . Alcohol use: No    Alcohol/week: 0.0 standard drinks  . Drug use: No  . Sexual activity: Not Currently    Partners: Male    Birth control/protection: Post-menopausal  Other Topics Concern  . Not on file  Social History Narrative  . Not on file   Social Determinants of Health   Financial Resource Strain:   . Difficulty of Paying Living Expenses: Not on file  Food Insecurity:   . Worried About Charity fundraiser in the Last Year: Not on file  . Ran Out of Food in the Last Year: Not on file  Transportation Needs:   . Lack of Transportation (Medical): Not on file  . Lack of Transportation (Non-Medical): Not on file  Physical Activity:   . Days of Exercise per Week: Not on file  . Minutes of Exercise per Session: Not on file  Stress:   . Feeling of Stress : Not on file  Social Connections:   . Frequency of Communication with Friends and Family: Not on file  . Frequency of Social Gatherings with Friends and Family: Not on file  . Attends Religious Services: Not on file  . Active Member of Clubs or  Organizations: Not on file  . Attends Archivist Meetings: Not on file  . Marital Status: Not on file  Intimate Partner Violence:   . Fear of Current or Ex-Partner: Not on file  . Emotionally Abused: Not on file  . Physically Abused: Not on file  . Sexually Abused: Not on file    Review of Systems  Genitourinary: Positive for vaginal discharge.  All other systems reviewed and are negative.   PHYSICAL EXAMINATION:    Pulse 77   Temp (!) 97.3 F (36.3 C)   Ht 5\' 2"  (1.575 m)   Wt 114 lb 9.6 oz (52 kg)   LMP 02/16/1988 (LMP Unknown)   SpO2 98%   BMI 20.96 kg/m     General appearance: alert, cooperative and appears stated age   Pelvic: External genitalia:  no lesions              Urethra:  normal appearing urethra with no masses, tenderness or lesions              Bartholins and Skenes: normal                 Vagina: posterior vaginal ulceration, bleeding - 2 cm.               Cervix: no lesions                Bimanual Exam:  Uterus:  normal size, contour, position, consistency, mobility, non-tender              Adnexa: no mass, fullness, tenderness            Pessary removed, cleansed, and placed in biobag.   Chaperone was present for exam.  ASSESSMENT  Incomplete uterovaginal prolapse. Pessary maintenance.  Vaginal ulceration.  Osteopenia.  Left hip pain.   PLAN  Leave pessary out until 12/29 office visit.  OK to use vaginal estrogen 1/2 gm pv at hs 2 -3 times weekly.  We reviewed her BMD and discussed FRAX model.  She will contact her PCP back to discuss physical therapy.    An After Visit Summary was printed and given to the patient.  ___15___ minutes face to face time of which over 50% was spent in counseling.

## 2019-02-05 NOTE — Progress Notes (Signed)
GYNECOLOGY  VISIT   HPI: 78 y.o.   Widowed  Caucasian  female   G1P1001 with Patient's last menstrual period was 02/16/1988 (lmp unknown).   here for pessary check.    She is using a pessary ring with support. She is back to using her original pessary.   Used vaginal estrogen yesterday.  She noticed a touch of blood on her panty liner after placing the cream. Thinks she may have scraped herself with her fingernail. This did not recur. She states it was not her rectum. Denies painful urination.   She can void better with the pessary in.   Has Raynaud's.   States she has anemia and will do follow up blood work in January with her PCP.   GYNECOLOGIC HISTORY: Patient's last menstrual period was 02/16/1988 (lmp unknown). Contraception:  PMP Menopausal hormone therapy:  Estradiol cream Last mammogram:01/10/19 - BI-RADS1, density B. [facility]. Last pap smear:  2012 normal        OB History    Gravida  1   Para  1   Term  1   Preterm      AB      Living  1     SAB      TAB      Ectopic      Multiple      Live Births                 Patient Active Problem List   Diagnosis Date Noted  . Intermittent palpitations 01/17/2017  . Right shoulder pain 12/14/2016  . Right carotid bruit 08/01/2015  . Uterine prolapse 06/13/2013  . Cystocele 06/13/2013  . Ex-cigarette smoker 05/04/2013  . Asthmatic bronchitis 05/04/2013  . 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 ESOPHAGEAL REFLUX 03/24/2010  . 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 COLONIC POLYPS 01/17/2008  . Hypothyroidism 01/17/2008    Past Medical History:  Diagnosis Date  . COPD (chronic obstructive pulmonary disease) ([place])   . History of chicken pox   . Hx of colonic polyps   . Hypercholesterolemia   . Hypothyroidism   . Melanoma of thigh ([place]) 02/15/2011  . MVP (mitral valve prolapse)   . Osteopenia   . Prolapsed uterus   . Varicose vein     Past Surgical History:  Procedure Laterality Date  . BREAST  SURGERY     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 Patient does not remember exact date of surgery.  [name] CATARACT EXTRACTION Right 10/25/2017  . [place]  . MELANOMA EXCISION  12/31/2010   Procedure: MELANOMA EXCISION;  Surgeon: Zenovia Jarred, MD;  Location: [facility];  Service: General;  Laterality: Left;  wide excision melanoma left thigh, excision lesion left thigh  . OTHER SURGICAL HISTORY Right    Surgical repair of architectual distortion Right Breast- Hyperplasia  . TONSILLECTOMY      Current Outpatient Medications  Medication Sig Dispense Refill  . acyclovir ointment (ZOVIRAX) 5 % See admin instructions. Prn cold sores  0  . aspirin 81 MG tablet Take 81 mg by mouth once a week.     . Calcium Carbonate-Vitamin D (CALCIUM + D PO) Take by mouth daily.      . Cholecalciferol (VITAMIN D) 2000 UNITS CAPS Take 1 capsule by mouth daily.    . fluticasone (FLONASE) 50 MCG/ACT nasal spray PLACE 2 SPRAYS INTO BOTH NOSTRILS AS NEEDED. 16 g 5  . hydrocortisone 2.5 % cream Place 1 application rectally  4 times daily 30 g 1  . ketoconazole (NIZORAL) 2 % cream PATIENT IS TO MIX WITH HYDROCORTISONE CREAM! APPLY 2-3 TIMES PER DAY    . levothyroxine (SYNTHROID, LEVOTHROID) 88 MCG tablet Take 1 tablet (88 mcg total) by mouth daily before breakfast. 30 tablet 11  . LUTEIN PO Take 1 tablet by mouth daily.    . Multiple Vitamins-Minerals (PRESERVISION AREDS 2 PO) Take by mouth.    . nicotine polacrilex (NICORETTE) 2 MG gum Take 2 mg by mouth as needed. Not smoking    . NONFORMULARY OR COMPOUNDED ITEM Vaginal estradiol cream 0.02%.  1/2 gram per vaginal at bedtime twice weekly. 8 each 4  . valACYclovir (VALTREX) 1000 MG tablet 2 tabs (2gm) x 1, repeat in 12 hours with symptom onset 30 tablet 2   No current facility-administered medications for this visit.     ALLERGIES: Codeine, Ciprofloxacin, and Sulfur  Family History  Problem Relation Age of Onset  . Hypertension  Mother   . Congestive Heart Failure Mother   . Thyroid disease Mother   . Hyperlipidemia Mother   . Heart disease Mother   . Stroke Father   . Heart attack Father 6  . Diabetes Paternal Grandmother   . Diabetes Paternal Grandfather   . Hypertension Maternal Grandfather   . CVA Maternal Grandfather   . Esophageal cancer Neg Hx   . Rectal cancer Neg Hx   . Stomach cancer Neg Hx   . Colon cancer Neg Hx     Social History   Socioeconomic History  . Marital status: Widowed    Spouse name: fred Monforte  . Number of children: Not on file  . Years of education: Not on file  . Highest education level: Not on file  Occupational History  . Not on file  Tobacco Use  . Smoking status: Former Smoker    Packs/day: 1.00    Years: 50.00    Pack years: 50.00    Types: Cigarettes    Start date: 03/08/1960    Quit date: 07/23/2010    Years since quitting: 8.5  . Smokeless tobacco: Never Used  . Tobacco comment: quit smoking 3 years ago  Substance and Sexual Activity  . Alcohol use: No    Alcohol/week: 0.0 standard drinks  . Drug use: No  . Sexual activity: Not Currently    Partners: Male    Birth control/protection: Post-menopausal  Other Topics Concern  . Not on file  Social History Narrative  . Not on file   Social Determinants of Health   Financial Resource Strain:   . Difficulty of Paying Living Expenses: Not on file  Food Insecurity:   . Worried About Charity fundraiser in the Last Year: Not on file  . Ran Out of Food in the Last Year: Not on file  Transportation Needs:   . Lack of Transportation (Medical): Not on file  . Lack of Transportation (Non-Medical): Not on file  Physical Activity:   . Days of Exercise per Week: Not on file  . Minutes of Exercise per Session: Not on file  Stress:   . Feeling of Stress : Not on file  Social Connections:   . Frequency of Communication with Friends and Family: Not on file  . Frequency of Social Gatherings with Friends and  Family: Not on file  . Attends Religious Services: Not on file  . Active Member of Clubs or Organizations: Not on file  . Attends Archivist Meetings: Not  on file  . Marital Status: Not on file  Intimate Partner Violence:   . Fear of Current or Ex-Partner: Not on file  . Emotionally Abused: Not on file  . Physically Abused: Not on file  . Sexually Abused: Not on file    Review of Systems  All other systems reviewed and are negative.   PHYSICAL EXAMINATION:    Temp (!) 96.3 F (35.7 C) (Temporal)   Ht 5\' 2"  (1.575 m)   Wt 114 lb 12.8 oz (52.1 kg)   LMP 02/16/1988 (LMP Unknown)   BMI 21.00 kg/m     General appearance: alert, cooperative and appears stated age   Pelvic: External genitalia:  no lesions              Urethra:  normal appearing urethra with no masses, tenderness or lesions              Bartholins and Skenes: normal                 Vagina: normal appearing vagina with normal color and discharge, small 1 cm shallow ulcer at posterior vaginal apex.               Cervix: no lesions                Bimanual Exam:  Uterus:  normal size, contour, position, consistency, mobility, non-tender              Adnexa: no mass, fullness, tenderness            Pessary placed with 1 gram of vaginal estrogen cream.  Chaperone was present for exam.  ASSESSMENT  Pessary maintenance.  Vaginal ulceration.  Improved with leaving the pessary out. Hx anemia.  Raynaud's.  PLAN  Continue pessary care.  Continue vaginal estrogen.  We discussed food rich in vitamins and iron.  We reviewed heat patches for core warming to help with her Raynaud's. Annual exam the beginning of February.  She knows she can return sooner if needed for pessary care.   An After Visit Summary was printed and given to the patient.  __15____ minutes face to face time of which over 50% was spent in counseling.

## 2019-02-13 ENCOUNTER — Ambulatory Visit: Payer: Medicare Other | Admitting: Obstetrics and Gynecology

## 2019-02-14 ENCOUNTER — Other Ambulatory Visit: Payer: Self-pay

## 2019-02-14 ENCOUNTER — Encounter: Payer: Self-pay | Admitting: Obstetrics and Gynecology

## 2019-02-14 ENCOUNTER — Ambulatory Visit: Payer: Medicare Other | Admitting: Obstetrics and Gynecology

## 2019-02-14 VITALS — BP 136/68 | HR 76 | Temp 96.3°F | Ht 62.0 in | Wt 114.8 lb

## 2019-02-14 DIAGNOSIS — T8369XD Infection and inflammatory reaction due to other prosthetic device, implant and graft in genital tract, subsequent encounter: Secondary | ICD-10-CM | POA: Diagnosis not present

## 2019-02-14 DIAGNOSIS — N760 Acute vaginitis: Secondary | ICD-10-CM | POA: Diagnosis not present

## 2019-02-14 DIAGNOSIS — Z4689 Encounter for fitting and adjustment of other specified devices: Secondary | ICD-10-CM

## 2019-03-04 ENCOUNTER — Ambulatory Visit: Payer: Medicare Other | Attending: Internal Medicine

## 2019-03-04 DIAGNOSIS — Z23 Encounter for immunization: Secondary | ICD-10-CM

## 2019-03-22 ENCOUNTER — Ambulatory Visit: Payer: Medicare PPO | Attending: Internal Medicine

## 2019-03-22 DIAGNOSIS — Z23 Encounter for immunization: Secondary | ICD-10-CM | POA: Insufficient documentation

## 2019-03-22 NOTE — Progress Notes (Signed)
   Covid-19 Vaccination Clinic  Name:  Ariel Holmes    MRN: [id number] DOB: Oct 29, 1940  03/22/2019  [name] was observed post Covid-19 immunization for 15 minutes without incidence. She was provided with Vaccine Information Sheet and instruction to access the V-Safe system.   [name] was instructed to call 911 with any severe reactions post vaccine: [name] Difficulty breathing  . Swelling of your face and throat  . A fast heartbeat  . A bad rash all over your body  . Dizziness and weakness    Immunizations Administered    Name Date Dose VIS Date Route   Pfizer COVID-19 Vaccine 03/22/2019  9:26 AM 0.3 mL 01/26/2019 Intramuscular   Manufacturer: [employer]   Lot: [id number]   [place]: [id number]

## 2019-03-26 ENCOUNTER — Other Ambulatory Visit: Payer: Self-pay | Admitting: Obstetrics and Gynecology

## 2019-03-26 NOTE — Telephone Encounter (Signed)
Medication refill request: hydrocortisone  Last OV: 02/14/19   Next AEX: 03/28/19 Last MMG (if hormonal medication request): 01/08/19 Birads 1 neg  Refill authorized: 30g with 1 RF

## 2019-03-27 ENCOUNTER — Other Ambulatory Visit: Payer: Self-pay

## 2019-03-27 NOTE — Progress Notes (Signed)
79 y.o. G12P1001 Widowed Caucasian female here for annual exam.    Some vaginal discharge.  No bleeding.  Using her pessary.  Bladder and bowel control are ok.  Has hemorrhoids.  Wants to travel to [place].   Had both Covid vaccines.  She did have side effects following the second vaccine.   She had labs with her PCP. Her hgb was 11.1.  PCP: Lajean Manes, MD    Patient's last menstrual period was 02/16/1988 (lmp unknown).           Sexually active: No.  The current method of family planning is post menopausal status.    Exercising: Yes.    walks 3-5 miles daily and strength Smoker:  no  Health Maintenance: Pap:   2012 normal History of abnormal Pap:  no MMG: 01-08-19 Neg/density B/BiRads1 Colonoscopy: 07/13/13 normal;next 06/2023 BMD: 01-08-19   Result :Osteopenia of hips. Spine normal.  Increased risk of fracture.  TDaP:  2012 Gardasil:   no HIV:no Hep C:no Screening Labs:  PCP.  Flu vaccine:  Completed.   reports that she quit smoking about 8 years ago. Her smoking use included cigarettes. She started smoking about 59 years ago. She has a 50.00 pack-year smoking history. She has never used smokeless tobacco. She reports that she does not drink alcohol or use drugs.  Past Medical History:  Diagnosis Date  . COPD (chronic obstructive pulmonary disease) ([place])   . History of chicken pox   . HSV-1 infection   . Hx of colonic polyps   . Hypercholesterolemia   . Hypothyroidism   . Melanoma of thigh ([place]) 02/15/2011  . MVP (mitral valve prolapse)   . Osteopenia   . Prolapsed uterus   . Raynaud disease   . Varicose vein     Past Surgical History:  Procedure Laterality Date  . 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 Patient does not remember exact date of surgery.  [name] CATARACT EXTRACTION Right 10/25/2017  . [place]  . MELANOMA EXCISION  12/31/2010   Procedure: MELANOMA EXCISION;  Surgeon: Zenovia Jarred, MD;  Location: [facility];  Service: General;  Laterality: Left;  wide excision melanoma left thigh, excision lesion left thigh  . OTHER SURGICAL HISTORY Right    Surgical repair of architectual distortion Right Breast- Hyperplasia  . TONSILLECTOMY      Current Outpatient Medications  Medication Sig Dispense Refill  . acyclovir ointment (ZOVIRAX) 5 % See admin instructions. Prn cold sores  0  . aspirin 81 MG tablet Take 81 mg by mouth once a week.     . Calcium Carbonate-Vitamin D (CALCIUM + D PO) Take by mouth daily.      . Cholecalciferol (VITAMIN D) 2000 UNITS CAPS Take 1 capsule by mouth daily.    . fluticasone (FLONASE) 50 MCG/ACT nasal spray PLACE 2 SPRAYS INTO BOTH NOSTRILS AS NEEDED. 16 g 5  . hydrocortisone 2.5 % cream PLACE 1 APPLICATION RECTALLY 4 TIMES DAILY 28.35 g 0  . ketoconazole (NIZORAL) 2 % cream PATIENT IS TO MIX WITH HYDROCORTISONE CREAM! APPLY 2-3 TIMES PER DAY    . levothyroxine (SYNTHROID, LEVOTHROID) 88 MCG tablet Take 1 tablet (88 mcg total) by mouth daily before breakfast. 30 tablet 11  . LUTEIN PO Take 1 tablet by mouth daily.    . Multiple Vitamins-Minerals (PRESERVISION AREDS 2 PO) Take by mouth.    . nicotine polacrilex (NICORETTE) 2 MG gum Take 2 mg  by mouth as needed. Not smoking    . NONFORMULARY OR COMPOUNDED ITEM Vaginal estradiol cream 0.02%.  1/2 gram per vaginal at bedtime twice weekly. 8 each 4  . valACYclovir (VALTREX) 1000 MG tablet 2 tabs (2gm) x 1, repeat in 12 hours with symptom onset 30 tablet 2   No current facility-administered medications for this visit.    Family History  Problem Relation Age of Onset  . Hypertension Mother   . Congestive Heart Failure Mother   . Thyroid disease Mother   . Hyperlipidemia Mother   . Heart disease Mother   . Stroke Father   . Heart attack Father 74  . Diabetes Paternal Grandmother   . Diabetes Paternal Grandfather   . Hypertension Maternal Grandfather   . CVA Maternal Grandfather   . Esophageal cancer  Neg Hx   . Rectal cancer Neg Hx   . Stomach cancer Neg Hx   . Colon cancer Neg Hx     Review of Systems  All other systems reviewed and are negative.   Exam:   BP 140/60   Pulse 64   Temp (!) 97.1 F (36.2 C) (Temporal)   Resp 14   Ht 5\' 2"  (1.575 m)   Wt 114 lb (51.7 kg)   LMP 02/16/1988 (LMP Unknown)   BMI 20.85 kg/m     General appearance: alert, cooperative and appears stated age Head: normocephalic, without obvious abnormality, atraumatic Neck: no adenopathy, supple, symmetrical, trachea midline and thyroid normal to inspection and palpation Lungs: clear to auscultation bilaterally Breasts: normal appearance, no masses or tenderness, No nipple retraction or dimpling, No nipple discharge or bleeding, No axillary adenopathy Heart: regular rate and rhythm Abdomen: soft, non-tender; no masses, no organomegaly Extremities: extremities normal, atraumatic, no cyanosis or edema Skin: skin color, texture, turgor normal. No rashes or lesions Lymph nodes: cervical, supraclavicular, and axillary nodes normal. Neurologic: grossly normal  Pelvic: External genitalia:  no lesions              No abnormal inguinal nodes palpated.              Urethra:  normal appearing urethra with no masses, tenderness or lesions              Bartholins and Skenes: normal                 Vagina: 2 cm shallow bleeding ulcer of the posterior vaginal apex.              Cervix: no lesions              Pap taken: No. Bimanual Exam:  Uterus:  normal size, contour, position, consistency, mobility, non-tender              Adnexa: no mass, fullness, tenderness              Rectal exam: Yes.  .  Confirms.              Anus:  normal sphincter tone, no lesions  Pessary removed, cleansed, and given to patient in a bag.  Chaperone was present for exam - Karmen Bongo.   Assessment:   Well woman visit with normal exam. Incomplete uterovaginal prolapse.  Pessary use.  Hx right breast hyperplasia.   Osteopenia with increased risk of fracture.  Hx melanoma.  Hx HSV I.  Anemia.  Hemorrhoids.  Plan: Mammogram screening discussed. Self breast awareness reviewed. Pap and HR HPV as above. Guidelines for Calcium,  Vitamin D, regular exercise program including cardiovascular and weight bearing exercise. Sees Dr. Elvera Lennox yearly. Does not need refills of Valtrex.  Keep pessary out for one month.  She will return sooner if having difficulty with her prolapse.  Refill of vaginal estrogen.  May use 1/2 gram pv at hs 2 -3 times weekly.  We did discuss estriol vaginal cream if desired to reduce the strength of the estrogen.  Follow up annually and prn.   After visit summary provided.

## 2019-03-28 ENCOUNTER — Encounter: Payer: Self-pay | Admitting: Obstetrics and Gynecology

## 2019-03-28 ENCOUNTER — Ambulatory Visit (INDEPENDENT_AMBULATORY_CARE_PROVIDER_SITE_OTHER): Payer: Medicare PPO | Admitting: Obstetrics and Gynecology

## 2019-03-28 VITALS — BP 140/60 | HR 64 | Temp 97.1°F | Resp 14 | Ht 62.0 in | Wt 114.0 lb

## 2019-03-28 DIAGNOSIS — N765 Ulceration of vagina: Secondary | ICD-10-CM | POA: Diagnosis not present

## 2019-03-28 DIAGNOSIS — Z01419 Encounter for gynecological examination (general) (routine) without abnormal findings: Secondary | ICD-10-CM

## 2019-03-28 DIAGNOSIS — N812 Incomplete uterovaginal prolapse: Secondary | ICD-10-CM | POA: Diagnosis not present

## 2019-03-28 DIAGNOSIS — Z4689 Encounter for fitting and adjustment of other specified devices: Secondary | ICD-10-CM | POA: Diagnosis not present

## 2019-03-28 MED ORDER — NONFORMULARY OR COMPOUNDED ITEM: 11 refills | Status: DC

## 2019-03-28 NOTE — Patient Instructions (Signed)

## 2019-04-17 ENCOUNTER — Other Ambulatory Visit: Payer: Self-pay

## 2019-04-18 ENCOUNTER — Encounter: Payer: Self-pay | Admitting: Obstetrics and Gynecology

## 2019-04-18 ENCOUNTER — Telehealth: Payer: Self-pay | Admitting: Obstetrics and Gynecology

## 2019-04-18 ENCOUNTER — Ambulatory Visit: Payer: Medicare PPO | Admitting: Obstetrics and Gynecology

## 2019-04-18 VITALS — BP 138/70 | HR 56 | Temp 97.0°F | Ht 62.0 in | Wt 115.8 lb

## 2019-04-18 DIAGNOSIS — N812 Incomplete uterovaginal prolapse: Secondary | ICD-10-CM

## 2019-04-18 NOTE — Telephone Encounter (Signed)
Dr Quincy Simmonds would like to see patient in office on Monday. Sending to triage to assist with scheduling.

## 2019-04-18 NOTE — Telephone Encounter (Signed)
Spoke to pt. Pt scheduled for pessary replacement for Monday at 1130 am per Dr Quincy Simmonds. Pt aware and agreeable. Pt requests if pessary comes in before end of this week, to have it replaced with Dr Quincy Simmonds or Dr Sabra Heck. Will return call to pt and update on when pessary returns to office. Pt agreeable.   Routing to Dr Quincy Simmonds for review and will close encounter.

## 2019-04-18 NOTE — Progress Notes (Signed)
GYNECOLOGY  VISIT   HPI: 79 y.o.   Widowed  Caucasian  female   G1P1001 with Patient's last menstrual period was 02/16/1988 (lmp unknown).   here for pessary check.    Patient had a 2 cm bleeding ulceration of the posterior vagina at her last visit on 03/28/19.  She has left the pessary out for 1 month and has been suing vaginal estrogen cream.   Harder to void with the pessary out.  No more odor or bleeding.   Still doing her walking.   GYNECOLOGIC HISTORY: Patient's last menstrual period was 02/16/1988 (lmp unknown). Contraception:  PMP Menopausal hormone therapy: Estrogen cream Last mammogram: 01-08-19 Neg/density B/BiRads1 Last pap smear: 2012 normal        OB History    Gravida  1   Para  1   Term  1   Preterm      AB      Living  1     SAB      TAB      Ectopic      Multiple      Live Births                 Patient Active Problem List   Diagnosis Date Noted  . Intermittent palpitations 01/17/2017  . Right shoulder pain 12/14/2016  . Right carotid bruit 08/01/2015  . Uterine prolapse 06/13/2013  . Cystocele 06/13/2013  . Ex-cigarette smoker 05/04/2013  . Asthmatic bronchitis 05/04/2013  . Melanoma of thigh ([place]) 02/15/2011  . ESOPHAGEAL REFLUX 03/24/2010  . 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 COLONIC POLYPS 01/17/2008  . Hypothyroidism 01/17/2008    Past Medical History:  Diagnosis Date  . COPD (chronic obstructive pulmonary disease) ([place])   . History of chicken pox   . HSV-1 infection   . Hx of colonic polyps   . Hypercholesterolemia   . Hypothyroidism   . Melanoma of thigh ([place]) 02/15/2011  . MVP (mitral valve prolapse)   . Osteopenia   . Prolapsed uterus   . Raynaud disease   . Varicose vein     Past Surgical History:  Procedure Laterality Date  . 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 Patient does not remember exact date of surgery.  [name] CATARACT EXTRACTION Right 10/25/2017  . [place]  .  MELANOMA EXCISION  12/31/2010   Procedure: MELANOMA EXCISION;  Surgeon: Zenovia Jarred, MD;  Location: [facility];  Service: General;  Laterality: Left;  wide excision melanoma left thigh, excision lesion left thigh  . OTHER SURGICAL HISTORY Right    Surgical repair of architectual distortion Right Breast- Hyperplasia  . TONSILLECTOMY      Current Outpatient Medications  Medication Sig Dispense Refill  . acyclovir ointment (ZOVIRAX) 5 % See admin instructions. Prn cold sores  0  . aspirin 81 MG tablet Take 81 mg by mouth once a week.     . Calcium Carbonate-Vitamin D (CALCIUM + D PO) Take by mouth daily.      . Cholecalciferol (VITAMIN D) 2000 UNITS CAPS Take 1 capsule by mouth daily.    . fluticasone (FLONASE) 50 MCG/ACT nasal spray PLACE 2 SPRAYS INTO BOTH NOSTRILS AS NEEDED. 16 g 5  . hydrocortisone 2.5 % cream PLACE 1 APPLICATION RECTALLY 4 TIMES DAILY 28.35 g 0  . ketoconazole (NIZORAL) 2 % cream PATIENT IS TO MIX WITH HYDROCORTISONE CREAM! APPLY 2-3 TIMES PER DAY    . levothyroxine (SYNTHROID,  LEVOTHROID) 88 MCG tablet Take 1 tablet (88 mcg total) by mouth daily before breakfast. 30 tablet 11  . LUTEIN PO Take 1 tablet by mouth daily.    . Multiple Vitamins-Minerals (PRESERVISION AREDS 2 PO) Take by mouth.    . nicotine polacrilex (NICORETTE) 2 MG gum Take 2 mg by mouth as needed. Not smoking    . NONFORMULARY OR COMPOUNDED ITEM Vaginal estradiol cream 0.02%.  1/2 gram per vaginal at bedtime two to three times weekly. 8 each 11  . valACYclovir (VALTREX) 1000 MG tablet 2 tabs (2gm) x 1, repeat in 12 hours with symptom onset 30 tablet 2   No current facility-administered medications for this visit.     ALLERGIES: Codeine, Ciprofloxacin, and Sulfur  Family History  Problem Relation Age of Onset  . Hypertension Mother   . Congestive Heart Failure Mother   . Thyroid disease Mother   . Hyperlipidemia Mother   . Heart disease Mother   . Stroke Father   . Heart  attack Father 3  . Diabetes Paternal Grandmother   . Diabetes Paternal Grandfather   . Hypertension Maternal Grandfather   . CVA Maternal Grandfather   . Esophageal cancer Neg Hx   . Rectal cancer Neg Hx   . Stomach cancer Neg Hx   . Colon cancer Neg Hx     Social History   Socioeconomic History  . Marital status: Widowed    Spouse name: fred Deas  . Number of children: Not on file  . Years of education: Not on file  . Highest education level: Not on file  Occupational History  . Not on file  Tobacco Use  . Smoking status: Former Smoker    Packs/day: 1.00    Years: 50.00    Pack years: 50.00    Types: Cigarettes    Start date: 03/08/1960    Quit date: 07/23/2010    Years since quitting: 8.7  . Smokeless tobacco: Never Used  . Tobacco comment: quit smoking 3 years ago  Substance and Sexual Activity  . Alcohol use: No    Alcohol/week: 0.0 standard drinks  . Drug use: No  . Sexual activity: Not Currently    Partners: Male    Birth control/protection: Post-menopausal  Other Topics Concern  . Not on file  Social History Narrative  . Not on file   Social Determinants of Health   Financial Resource Strain:   . Difficulty of Paying Living Expenses: Not on file  Food Insecurity:   . Worried About Charity fundraiser in the Last Year: Not on file  . Ran Out of Food in the Last Year: Not on file  Transportation Needs:   . Lack of Transportation (Medical): Not on file  . Lack of Transportation (Non-Medical): Not on file  Physical Activity:   . Days of Exercise per Week: Not on file  . Minutes of Exercise per Session: Not on file  Stress:   . Feeling of Stress : Not on file  Social Connections:   . Frequency of Communication with Friends and Family: Not on file  . Frequency of Social Gatherings with Friends and Family: Not on file  . Attends Religious Services: Not on file  . Active Member of Clubs or Organizations: Not on file  . Attends Archivist  Meetings: Not on file  . Marital Status: Not on file  Intimate Partner Violence:   . Fear of Current or Ex-Partner: Not on file  . Emotionally Abused:  Not on file  . Physically Abused: Not on file  . Sexually Abused: Not on file    Review of Systems  All other systems reviewed and are negative.   PHYSICAL EXAMINATION:    BP 138/70   Pulse (!) 56   Temp (!) 97 F (36.1 C) (Temporal)   Ht 5\' 2"  (1.575 m)   Wt 115 lb 12.8 oz (52.5 kg)   LMP 02/16/1988 (LMP Unknown)   BMI 21.18 kg/m     General appearance: alert, cooperative and appears stated age  Pelvic: External genitalia:  no lesions              Urethra:  normal appearing urethra with no masses, tenderness or lesions              Bartholins and Skenes: normal                 Vagina: normal appearing vagina with normal color and discharge, no lesions.  No ulceration.               Cervix: no lesions                Bimanual Exam:  Uterus:  normal size, contour, position, consistency, mobility, non-tender              Adnexa: no mass, fullness, tenderness            Pessary was cleansed and estrogen cream 1 gram placed on pessary.  The pessary fell on the floor at that point.   Chaperone was present for exam.  ASSESSMENT  Incomplete uterovaginal prolapse. Dropped pessary.   PLAN  I gave the patient the option of sterilizing the pessary through central sterilization versus her receiving a new pessary.  She opts for sterilization process.  FU on 04/23/19.  Will call her sooner if it comes in before then.    An After Visit Summary was printed and given to the patient.

## 2019-04-23 ENCOUNTER — Ambulatory Visit: Payer: Self-pay | Admitting: Obstetrics and Gynecology

## 2019-04-23 NOTE — Progress Notes (Signed)
GYNECOLOGY  VISIT   HPI: 79 y.o.   Widowed  Caucasian  female   G1P1001 with Patient's last menstrual period was 02/16/1988 (lmp unknown).   here for pessary insertion.    Her pessary fell on the floor at the last visit when I was placing it in the vagina.  It was sent out for sterilization and has returned to the office with her name on it.   GYNECOLOGIC HISTORY: Patient's last menstrual period was 02/16/1988 (lmp unknown). Contraception:  PMP Menopausal hormone therapy:  Estrogen cream Last mammogram: 01-08-19 Neg/density B/BiRads1 Last pap smear: 2012 normal        OB History    Gravida  1   Para  1   Term  1   Preterm      AB      Living  1     SAB      TAB      Ectopic      Multiple      Live Births                 Patient Active Problem List   Diagnosis Date Noted  . Intermittent palpitations 01/17/2017  . Right shoulder pain 12/14/2016  . Right carotid bruit 08/01/2015  . Uterine prolapse 06/13/2013  . Cystocele 06/13/2013  . Ex-cigarette smoker 05/04/2013  . Asthmatic bronchitis 05/04/2013  . 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 ESOPHAGEAL REFLUX 03/24/2010  . 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 COLONIC POLYPS 01/17/2008  . Hypothyroidism 01/17/2008    Past Medical History:  Diagnosis Date  . COPD (chronic obstructive pulmonary disease) ([place])   . History of chicken pox   . HSV-1 infection   . Hx of colonic polyps   . Hypercholesterolemia   . Hypothyroidism   . 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 MVP (mitral valve prolapse)   . Osteopenia   . Prolapsed uterus   . Raynaud disease   . Varicose vein     Past Surgical History:  Procedure Laterality Date  . 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 Patient does not remember exact date of surgery.  [name] CATARACT EXTRACTION Right 10/25/2017  . [place]  . MELANOMA EXCISION  12/31/2010   Procedure: MELANOMA EXCISION;  Surgeon: Zenovia Jarred, MD;  Location:  [facility];  Service: General;  Laterality: Left;  wide excision melanoma left thigh, excision lesion left thigh  . OTHER SURGICAL HISTORY Right    Surgical repair of architectual distortion Right Breast- Hyperplasia  . TONSILLECTOMY      Current Outpatient Medications  Medication Sig Dispense Refill  . acyclovir ointment (ZOVIRAX) 5 % See admin instructions. Prn cold sores  0  . aspirin 81 MG tablet Take 81 mg by mouth once a week.     . Calcium Carbonate-Vitamin D (CALCIUM + D PO) Take by mouth daily.      . Cholecalciferol (VITAMIN D) 2000 UNITS CAPS Take 1 capsule by mouth daily.    . fluticasone (FLONASE) 50 MCG/ACT nasal spray PLACE 2 SPRAYS INTO BOTH NOSTRILS AS NEEDED. 16 g 5  . hydrocortisone 2.5 % cream PLACE 1 APPLICATION RECTALLY 4 TIMES DAILY 28.35 g 0  . ketoconazole (NIZORAL) 2 % cream PATIENT IS TO MIX WITH HYDROCORTISONE CREAM! APPLY 2-3 TIMES PER DAY    . levothyroxine (SYNTHROID, LEVOTHROID) 88 MCG tablet Take 1 tablet (88 mcg total) by mouth daily before breakfast. 30 tablet 11  .  LUTEIN PO Take 1 tablet by mouth daily.    . Multiple Vitamins-Minerals (PRESERVISION AREDS 2 PO) Take by mouth.    . nicotine polacrilex (NICORETTE) 2 MG gum Take 2 mg by mouth as needed. Not smoking    . NONFORMULARY OR COMPOUNDED ITEM Vaginal estradiol cream 0.02%.  1/2 gram per vaginal at bedtime two to three times weekly. 8 each 11  . valACYclovir (VALTREX) 1000 MG tablet 2 tabs (2gm) x 1, repeat in 12 hours with symptom onset 30 tablet 2   No current facility-administered medications for this visit.     ALLERGIES: Codeine, Ciprofloxacin, and Sulfur  Family History  Problem Relation Age of Onset  . Hypertension Mother   . Congestive Heart Failure Mother   . Thyroid disease Mother   . Hyperlipidemia Mother   . Heart disease Mother   . Stroke Father   . Heart attack Father 65  . Diabetes Paternal Grandmother   . Diabetes Paternal Grandfather   . Hypertension  Maternal Grandfather   . CVA Maternal Grandfather   . Esophageal cancer Neg Hx   . Rectal cancer Neg Hx   . Stomach cancer Neg Hx   . Colon cancer Neg Hx     Social History   Socioeconomic History  . Marital status: Widowed    Spouse name: fred Samano  . Number of children: Not on file  . Years of education: Not on file  . Highest education level: Not on file  Occupational History  . Not on file  Tobacco Use  . Smoking status: Former Smoker    Packs/day: 1.00    Years: 50.00    Pack years: 50.00    Types: Cigarettes    Start date: 03/08/1960    Quit date: 07/23/2010    Years since quitting: 8.7  . Smokeless tobacco: Never Used  . Tobacco comment: quit smoking 3 years ago  Substance and Sexual Activity  . Alcohol use: No    Alcohol/week: 0.0 standard drinks  . Drug use: No  . Sexual activity: Not Currently    Partners: Male    Birth control/protection: Post-menopausal  Other Topics Concern  . Not on file  Social History Narrative  . Not on file   Social Determinants of Health   Financial Resource Strain:   . Difficulty of Paying Living Expenses: Not on file  Food Insecurity:   . Worried About Charity fundraiser in the Last Year: Not on file  . Ran Out of Food in the Last Year: Not on file  Transportation Needs:   . Lack of Transportation (Medical): Not on file  . Lack of Transportation (Non-Medical): Not on file  Physical Activity:   . Days of Exercise per Week: Not on file  . Minutes of Exercise per Session: Not on file  Stress:   . Feeling of Stress : Not on file  Social Connections:   . Frequency of Communication with Friends and Family: Not on file  . Frequency of Social Gatherings with Friends and Family: Not on file  . Attends Religious Services: Not on file  . Active Member of Clubs or Organizations: Not on file  . Attends Archivist Meetings: Not on file  . Marital Status: Not on file  Intimate Partner Violence:   . Fear of Current or  Ex-Partner: Not on file  . Emotionally Abused: Not on file  . Physically Abused: Not on file  . Sexually Abused: Not on file  Review of Systems  All other systems reviewed and are negative.   PHYSICAL EXAMINATION:    BP 138/70   Pulse (!) 56   Temp (!) 97 F (36.1 C) (Temporal)   Ht 5\' 2"  (1.575 m)   Wt 115 lb 1.9 oz (52.2 kg)   LMP 02/16/1988 (LMP Unknown)   BMI 21.06 kg/m     General appearance: alert, cooperative and appears stated age   Pessary placed in the vagina with 1 gram of estradiol cream.  No problems.    Chaperone was present for exam.  ASSESSMENT  Incomplete uterovaginal prolapse.  Pessary maintenance.   PLAN  Continue pessary use and use of vaginal estrogen cream.  FU in 5 weeks for next check and prn.    An After Visit Summary was printed and given to the patient.

## 2019-04-23 NOTE — Progress Notes (Deleted)
GYNECOLOGY  VISIT   HPI: 79 y.o.   Widowed  Caucasian  female   G1P1001 with Patient's last menstrual period was 02/16/1988 (lmp unknown).   here for pessary insertion.   GYNECOLOGIC HISTORY: Patient's last menstrual period was 02/16/1988 (lmp unknown). Contraception:  PMP Menopausal hormone therapy: Estrogen cream Last mammogram: 01-08-19 Neg/density B/BiRads1 Last pap smear: 2012 normal        OB History    Gravida  1   Para  1   Term  1   Preterm      AB      Living  1     SAB      TAB      Ectopic      Multiple      Live Births                 Patient Active Problem List   Diagnosis Date Noted  . Intermittent palpitations 01/17/2017  . Right shoulder pain 12/14/2016  . Right carotid bruit 08/01/2015  . Uterine prolapse 06/13/2013  . Cystocele 06/13/2013  . Ex-cigarette smoker 05/04/2013  . Asthmatic bronchitis 05/04/2013  . Melanoma of thigh ([place]) 02/15/2011  . ESOPHAGEAL REFLUX 03/24/2010  . 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 COLONIC POLYPS 01/17/2008  . Hypothyroidism 01/17/2008    Past Medical History:  Diagnosis Date  . COPD (chronic obstructive pulmonary disease) ([place])   . History of chicken pox   . HSV-1 infection   . Hx of colonic polyps   . Hypercholesterolemia   . Hypothyroidism   . 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 MVP (mitral valve prolapse)   . Osteopenia   . Prolapsed uterus   . Raynaud disease   . Varicose vein     Past Surgical History:  Procedure Laterality Date  . 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 Patient does not remember exact date of surgery.  [name] CATARACT EXTRACTION Right 10/25/2017  . [place]  . MELANOMA EXCISION  12/31/2010   Procedure: MELANOMA EXCISION;  Surgeon: Zenovia Jarred, MD;  Location: [facility];  Service: General;  Laterality: Left;  wide excision melanoma left thigh, excision lesion left thigh  . OTHER SURGICAL HISTORY Right    Surgical  repair of architectual distortion Right Breast- Hyperplasia  . TONSILLECTOMY      Current Outpatient Medications  Medication Sig Dispense Refill  . acyclovir ointment (ZOVIRAX) 5 % See admin instructions. Prn cold sores  0  . aspirin 81 MG tablet Take 81 mg by mouth once a week.     . Calcium Carbonate-Vitamin D (CALCIUM + D PO) Take by mouth daily.      . Cholecalciferol (VITAMIN D) 2000 UNITS CAPS Take 1 capsule by mouth daily.    . fluticasone (FLONASE) 50 MCG/ACT nasal spray PLACE 2 SPRAYS INTO BOTH NOSTRILS AS NEEDED. 16 g 5  . hydrocortisone 2.5 % cream PLACE 1 APPLICATION RECTALLY 4 TIMES DAILY 28.35 g 0  . ketoconazole (NIZORAL) 2 % cream PATIENT IS TO MIX WITH HYDROCORTISONE CREAM! APPLY 2-3 TIMES PER DAY    . levothyroxine (SYNTHROID, LEVOTHROID) 88 MCG tablet Take 1 tablet (88 mcg total) by mouth daily before breakfast. 30 tablet 11  . LUTEIN PO Take 1 tablet by mouth daily.    . Multiple Vitamins-Minerals (PRESERVISION AREDS 2 PO) Take by mouth.    . nicotine polacrilex (NICORETTE) 2 MG gum Take 2 mg by mouth as needed.  Not smoking    . NONFORMULARY OR COMPOUNDED ITEM Vaginal estradiol cream 0.02%.  1/2 gram per vaginal at bedtime two to three times weekly. 8 each 11  . valACYclovir (VALTREX) 1000 MG tablet 2 tabs (2gm) x 1, repeat in 12 hours with symptom onset 30 tablet 2   No current facility-administered medications for this visit.     ALLERGIES: Codeine, Ciprofloxacin, and Sulfur  Family History  Problem Relation Age of Onset  . Hypertension Mother   . Congestive Heart Failure Mother   . Thyroid disease Mother   . Hyperlipidemia Mother   . Heart disease Mother   . Stroke Father   . Heart attack Father 4  . Diabetes Paternal Grandmother   . Diabetes Paternal Grandfather   . Hypertension Maternal Grandfather   . CVA Maternal Grandfather   . Esophageal cancer Neg Hx   . Rectal cancer Neg Hx   . Stomach cancer Neg Hx   . Colon cancer Neg Hx     Social History    Socioeconomic History  . Marital status: Widowed    Spouse name: fred Guimont  . Number of children: Not on file  . Years of education: Not on file  . Highest education level: Not on file  Occupational History  . Not on file  Tobacco Use  . Smoking status: Former Smoker    Packs/day: 1.00    Years: 50.00    Pack years: 50.00    Types: Cigarettes    Start date: 03/08/1960    Quit date: 07/23/2010    Years since quitting: 8.7  . Smokeless tobacco: Never Used  . Tobacco comment: quit smoking 3 years ago  Substance and Sexual Activity  . Alcohol use: No    Alcohol/week: 0.0 standard drinks  . Drug use: No  . Sexual activity: Not Currently    Partners: Male    Birth control/protection: Post-menopausal  Other Topics Concern  . Not on file  Social History Narrative  . Not on file   Social Determinants of Health   Financial Resource Strain:   . Difficulty of Paying Living Expenses: Not on file  Food Insecurity:   . Worried About Charity fundraiser in the Last Year: Not on file  . Ran Out of Food in the Last Year: Not on file  Transportation Needs:   . Lack of Transportation (Medical): Not on file  . Lack of Transportation (Non-Medical): Not on file  Physical Activity:   . Days of Exercise per Week: Not on file  . Minutes of Exercise per Session: Not on file  Stress:   . Feeling of Stress : Not on file  Social Connections:   . Frequency of Communication with Friends and Family: Not on file  . Frequency of Social Gatherings with Friends and Family: Not on file  . Attends Religious Services: Not on file  . Active Member of Clubs or Organizations: Not on file  . Attends Archivist Meetings: Not on file  . Marital Status: Not on file  Intimate Partner Violence:   . Fear of Current or Ex-Partner: Not on file  . Emotionally Abused: Not on file  . Physically Abused: Not on file  . Sexually Abused: Not on file    Review of Systems  PHYSICAL EXAMINATION:    LMP  02/16/1988 (LMP Unknown)     General appearance: alert, cooperative and appears stated age Head: Normocephalic, without obvious abnormality, atraumatic Neck: no adenopathy, supple, symmetrical, trachea midline  and thyroid normal to inspection and palpation Lungs: clear to auscultation bilaterally Breasts: normal appearance, no masses or tenderness, No nipple retraction or dimpling, No nipple discharge or bleeding, No axillary or supraclavicular adenopathy Heart: regular rate and rhythm Abdomen: soft, non-tender, no masses,  no organomegaly Extremities: extremities normal, atraumatic, no cyanosis or edema Skin: Skin color, texture, turgor normal. No rashes or lesions Lymph nodes: Cervical, supraclavicular, and axillary nodes normal. No abnormal inguinal nodes palpated Neurologic: Grossly normal  Pelvic: External genitalia:  no lesions              Urethra:  normal appearing urethra with no masses, tenderness or lesions              Bartholins and Skenes: normal                 Vagina: normal appearing vagina with normal color and discharge, no lesions              Cervix: no lesions                Bimanual Exam:  Uterus:  normal size, contour, position, consistency, mobility, non-tender              Adnexa: no mass, fullness, tenderness              Rectal exam: {yes no:[id number]}.  Confirms.              Anus:  normal sphincter tone, no lesions  Chaperone was present for exam.  ASSESSMENT     PLAN     An After Visit Summary was printed and given to the patient.  ______ minutes face to face time of which over 50% was spent in counseling.

## 2019-04-24 ENCOUNTER — Other Ambulatory Visit: Payer: Self-pay

## 2019-04-24 ENCOUNTER — Encounter: Payer: Self-pay | Admitting: Obstetrics and Gynecology

## 2019-04-24 ENCOUNTER — Ambulatory Visit (INDEPENDENT_AMBULATORY_CARE_PROVIDER_SITE_OTHER): Payer: Medicare PPO | Admitting: Obstetrics and Gynecology

## 2019-04-24 VITALS — BP 138/70 | HR 56 | Temp 97.0°F | Ht 62.0 in | Wt 115.1 lb

## 2019-04-24 DIAGNOSIS — N812 Incomplete uterovaginal prolapse: Secondary | ICD-10-CM

## 2019-05-21 DIAGNOSIS — D649 Anemia, unspecified: Secondary | ICD-10-CM | POA: Diagnosis not present

## 2019-05-28 ENCOUNTER — Other Ambulatory Visit: Payer: Self-pay

## 2019-05-29 NOTE — Progress Notes (Signed)
GYNECOLOGY  VISIT  HPI: 79 y.o. G36P1001 [name] here for pessary check.  Pt denies any vaginal discharge or odor.  Had pessary sterilized prior to last visit and wonders if this has helped.  Denies urinary symptoms.    GYNECOLOGIC HISTORY: Patient's last menstrual period was 02/16/1988 (lmp unknown). Contraception: PMP Menopausal hormone therapy: Estrogen cream  Patient Active Problem List   Diagnosis Date Noted  . Intermittent palpitations 01/17/2017  . Right shoulder pain 12/14/2016  . Right carotid bruit 08/01/2015  . Uterine prolapse 06/13/2013  . Cystocele 06/13/2013  . Ex-cigarette smoker 05/04/2013  . Asthmatic bronchitis 05/04/2013  . Melanoma of thigh ([place]) 02/15/2011  . ESOPHAGEAL REFLUX 03/24/2010  . 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 COLONIC POLYPS 01/17/2008  . Hypothyroidism 01/17/2008    Past Medical History:  Diagnosis Date  . COPD (chronic obstructive pulmonary disease) ([place])   . History of chicken pox   . HSV-1 infection   . Hx of colonic polyps   . Hypercholesterolemia   . Hypothyroidism   . 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 MVP (mitral valve prolapse)   . Osteopenia   . Prolapsed uterus   . Raynaud disease   . Varicose vein     Past Surgical History:  Procedure Laterality Date  . 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 Patient does not remember exact date of surgery.  [name] CATARACT EXTRACTION Right 10/25/2017  . [place]  . MELANOMA EXCISION  12/31/2010   Procedure: MELANOMA EXCISION;  Surgeon: Zenovia Jarred, MD;  Location: [facility];  Service: General;  Laterality: Left;  wide excision melanoma left thigh, excision lesion left thigh  . OTHER SURGICAL HISTORY Right    Surgical repair of architectual distortion Right Breast- Hyperplasia  . TONSILLECTOMY      MEDS:   Current Outpatient Medications on File Prior to Visit  Medication Sig Dispense Refill  . acyclovir  ointment (ZOVIRAX) 5 % See admin instructions. Prn cold sores  0  . aspirin 81 MG tablet Take 81 mg by mouth once a week.     . Calcium Carbonate-Vitamin D (CALCIUM + D PO) Take by mouth daily.      . Cholecalciferol (VITAMIN D) 2000 UNITS CAPS Take 1 capsule by mouth daily.    . fluticasone (FLONASE) 50 MCG/ACT nasal spray PLACE 2 SPRAYS INTO BOTH NOSTRILS AS NEEDED. 16 g 5  . hydrocortisone 2.5 % cream PLACE 1 APPLICATION RECTALLY 4 TIMES DAILY 28.35 g 0  . ketoconazole (NIZORAL) 2 % cream PATIENT IS TO MIX WITH HYDROCORTISONE CREAM! APPLY 2-3 TIMES PER DAY    . levothyroxine (SYNTHROID, LEVOTHROID) 88 MCG tablet Take 1 tablet (88 mcg total) by mouth daily before breakfast. 30 tablet 11  . LUTEIN PO Take 1 tablet by mouth daily.    . Multiple Vitamins-Minerals (PRESERVISION AREDS 2 PO) Take by mouth.    . nicotine polacrilex (NICORETTE) 2 MG gum Take 2 mg by mouth as needed. Not smoking    . NONFORMULARY OR COMPOUNDED ITEM Vaginal estradiol cream 0.02%.  1/2 gram per vaginal at bedtime two to three times weekly. 8 each 11  . valACYclovir (VALTREX) 1000 MG tablet 2 tabs (2gm) x 1, repeat in 12 hours with symptom onset 30 tablet 2   No current facility-administered medications on file prior to visit.    ALLERGIES: Codeine, Ciprofloxacin, and Sulfur  Family History  Problem Relation Age of Onset  .  Hypertension Mother   . Congestive Heart Failure Mother   . Thyroid disease Mother   . Hyperlipidemia Mother   . Heart disease Mother   . Stroke Father   . Heart attack Father 52  . Diabetes Paternal Grandmother   . Diabetes Paternal Grandfather   . Hypertension Maternal Grandfather   . CVA Maternal Grandfather   . Esophageal cancer Neg Hx   . Rectal cancer Neg Hx   . Stomach cancer Neg Hx   . Colon cancer Neg Hx     SH:  Widowed, non smoker  Review of Systems  All other systems reviewed and are negative.   PHYSICAL EXAMINATION:    BP 112/60 (BP Location: Left Arm, Patient  Position: Sitting, Cuff Size: Normal)   Pulse 76   Temp (!) 97.3 F (36.3 C) (Temporal)   Ht 5\' 2"  (1.575 m)   Wt 114 lb 12.8 oz (52.1 kg)   LMP 02/16/1988 (LMP Unknown)   BMI 21.00 kg/m     General appearance: alert, cooperative and appears stated age Lymph:  no inguinal LAD noted  Pelvic: External genitalia:  no lesions              Urethra:  normal appearing urethra with no masses, tenderness or lesions              Bartholins and Skenes: normal                 Vagina: normal appearing vagina with normal color and discharge, no lesions, pessary was removed cleansed and replaced after examination was completed              Cervix: no lesions              Bimanual Exam:  Uterus:  normal size, contour, position, consistency, mobility, non-tender              Adnexa: no mass, fullness, tenderness  Chaperone, Terence Lux, CMA, was present for exam.  Assessment: Incomplete uterine prolapse Pessary use  Plan: Recheck about 5 weeks.  Pt will return to see Dr. Quincy Simmonds.

## 2019-05-30 ENCOUNTER — Ambulatory Visit: Payer: Medicare PPO | Admitting: Obstetrics & Gynecology

## 2019-05-30 ENCOUNTER — Encounter: Payer: Self-pay | Admitting: Obstetrics & Gynecology

## 2019-05-30 ENCOUNTER — Ambulatory Visit: Payer: Medicare PPO | Admitting: Obstetrics and Gynecology

## 2019-05-30 ENCOUNTER — Other Ambulatory Visit: Payer: Self-pay

## 2019-05-30 VITALS — BP 112/60 | HR 76 | Temp 97.3°F | Ht 62.0 in | Wt 114.8 lb

## 2019-05-30 DIAGNOSIS — N812 Incomplete uterovaginal prolapse: Secondary | ICD-10-CM | POA: Diagnosis not present

## 2019-06-05 ENCOUNTER — Ambulatory Visit: Payer: Medicare PPO | Admitting: Obstetrics & Gynecology

## 2019-06-07 ENCOUNTER — Ambulatory Visit: Payer: Medicare PPO | Admitting: Obstetrics & Gynecology

## 2019-06-07 ENCOUNTER — Other Ambulatory Visit: Payer: Self-pay

## 2019-06-07 ENCOUNTER — Encounter: Payer: Self-pay | Admitting: Obstetrics & Gynecology

## 2019-06-07 VITALS — BP 110/70 | HR 68 | Temp 97.0°F | Resp 16 | Wt 118.0 lb

## 2019-06-07 DIAGNOSIS — R300 Dysuria: Secondary | ICD-10-CM | POA: Diagnosis not present

## 2019-06-07 MED ORDER — NITROFURANTOIN MONOHYD MACRO 100 MG PO CAPS: 100.0000 mg | ORAL_CAPSULE | Freq: Two times a day (BID) | ORAL | 0 refills | Status: DC

## 2019-06-07 NOTE — Progress Notes (Signed)
GYNECOLOGY  VISIT  CC:   dysuria  HPI: 79 y.o. G40P1001 [name] here for possible UTI.  Yesterday morning with her first void of the day, she had severe dysuria.  She had some AZO at home that she took.  She took AZO throughout the day.  Hasn't noticed any cloudiness or blood in urine.  Now, with the AZO, her urine is very orange.  She noticed earlier this week, she had some pelvic cramping and wants to know if this is related.  Denies vaginal bleeding or discharge.  She's also had some lower back pain as well.  She used Salonpas on her skin topically.    GYNECOLOGIC HISTORY: Patient's last menstrual period was 02/16/1988 (lmp unknown). Contraception: post menopausal Menopausal hormone therapy: estradiol 0.02 % poct urine- unable to do due to azo  Patient Active Problem List   Diagnosis Date Noted  . Intermittent palpitations 01/17/2017  . Right shoulder pain 12/14/2016  . Right carotid bruit 08/01/2015  . Uterine prolapse 06/13/2013  . Cystocele 06/13/2013  . Ex-cigarette smoker 05/04/2013  . Asthmatic bronchitis 05/04/2013  . Melanoma of thigh ([place]) 02/15/2011  . ESOPHAGEAL REFLUX 03/24/2010  . 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 COLONIC POLYPS 01/17/2008  . Hypothyroidism 01/17/2008    Past Medical History:  Diagnosis Date  . COPD (chronic obstructive pulmonary disease) ([place])   . History of chicken pox   . HSV-1 infection   . Hx of colonic polyps   . Hypercholesterolemia   . Hypothyroidism   . Melanoma of thigh ([place]) 02/15/2011  . MVP (mitral valve prolapse)   . Osteopenia   . Prolapsed uterus   . Raynaud disease   . Varicose vein     Past Surgical History:  Procedure Laterality Date  . 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 Patient does not remember exact date of surgery.  [name] CATARACT EXTRACTION Right 10/25/2017  . [place]  . MELANOMA EXCISION  12/31/2010   Procedure: MELANOMA EXCISION;  Surgeon: Zenovia Jarred, MD;  Location: [facility];  Service: General;  Laterality: Left;  wide excision melanoma left thigh, excision lesion left thigh  . OTHER SURGICAL HISTORY Right    Surgical repair of architectual distortion Right Breast- Hyperplasia  . TONSILLECTOMY      MEDS:   Current Outpatient Medications on File Prior to Visit  Medication Sig Dispense Refill  . acyclovir ointment (ZOVIRAX) 5 % See admin instructions. Prn cold sores  0  . aspirin 81 MG tablet Take 81 mg by mouth once a week.     . Calcium Carbonate-Vitamin D (CALCIUM + D PO) Take by mouth daily.      . Cholecalciferol (VITAMIN D) 2000 UNITS CAPS Take 1 capsule by mouth daily.    . fluticasone (FLONASE) 50 MCG/ACT nasal spray PLACE 2 SPRAYS INTO BOTH NOSTRILS AS NEEDED. 16 g 5  . hydrocortisone 2.5 % cream PLACE 1 APPLICATION RECTALLY 4 TIMES DAILY 28.35 g 0  . levothyroxine (SYNTHROID, LEVOTHROID) 88 MCG tablet Take 1 tablet (88 mcg total) by mouth daily before breakfast. 30 tablet 11  . LUTEIN PO Take 1 tablet by mouth daily.    . Multiple Vitamins-Minerals (PRESERVISION AREDS 2 PO) Take by mouth.    . nicotine polacrilex (NICORETTE) 2 MG gum Take 2 mg by mouth as needed. Not smoking    . NONFORMULARY OR COMPOUNDED ITEM Vaginal estradiol cream 0.02%.  1/2 gram per  vaginal at bedtime two to three times weekly. 8 each 11  . valACYclovir (VALTREX) 1000 MG tablet 2 tabs (2gm) x 1, repeat in 12 hours with symptom onset 30 tablet 2   No current facility-administered medications on file prior to visit.    ALLERGIES: Codeine, Ciprofloxacin, and Sulfur  Family History  Problem Relation Age of Onset  . Hypertension Mother   . Congestive Heart Failure Mother   . Thyroid disease Mother   . Hyperlipidemia Mother   . Heart disease Mother   . Stroke Father   . Heart attack Father 64  . Diabetes Paternal Grandmother   . Diabetes Paternal Grandfather   . Hypertension Maternal Grandfather   . CVA Maternal Grandfather    . Esophageal cancer Neg Hx   . Rectal cancer Neg Hx   . Stomach cancer Neg Hx   . Colon cancer Neg Hx     SH:  Widowed, non smoker  Review of Systems  Constitutional: Negative.   HENT: Negative.   Eyes: Negative.   Respiratory: Negative.   Cardiovascular: Negative.   Gastrointestinal: Negative.   Endocrine: Negative.   Genitourinary: Positive for dysuria and frequency.  Musculoskeletal: Negative.   Skin: Negative.   Allergic/Immunologic: Negative.   Neurological: Negative.   Psychiatric/Behavioral: Negative.     PHYSICAL EXAMINATION:    BP 110/70   Pulse 68   Temp (!) 97 F (36.1 C) (Skin)   Resp 16   Wt 118 lb (53.5 kg)   LMP 02/16/1988 (LMP Unknown)   BMI 21.58 kg/m     General appearance: alert, cooperative and appears stated age Flank:  No CVA tenderness Lymph:  no inguinal LAD noted  Pelvic: External genitalia:  no lesions              Urethra:  normal appearing urethra with no masses, tenderness or lesions              Bartholins and Skenes: normal                 Vagina: normal appearing vagina with normal color and discharge, no lesions              Cervix: no lesions, pessary is in correctly              Bimanual Exam:  Uterus:  normal size, contour, position, consistency, mobility, non-tender              Adnexa: no mass, fullness, tenderness  Chaperone, Joy Johnsonl, CMA, was present for exam.  Assessment: Dysuria Pessary  Plan: Urine culture pending Macrobid 100mg  bid x 5 days.

## 2019-06-08 ENCOUNTER — Telehealth: Payer: Self-pay | Admitting: *Deleted

## 2019-06-08 MED ORDER — CEPHALEXIN 250 MG PO CAPS: 250.0000 mg | ORAL_CAPSULE | Freq: Four times a day (QID) | ORAL | 0 refills | Status: DC

## 2019-06-08 NOTE — Telephone Encounter (Signed)
Patient is having side effects of medication.

## 2019-06-08 NOTE — Telephone Encounter (Signed)
Patient calling for update on medication. Advised patient I have forwarded an update to Dr. Sabra Heck, reassured patient our office will notify of recommendations once reviewed by Dr. Sabra Heck. Patient thankful for f/u.

## 2019-06-08 NOTE — Telephone Encounter (Signed)
Ok to use keflex 250mg  qid x 5 days.  #20/0RF.  Please sent rx to pharmacy.

## 2019-06-08 NOTE — Telephone Encounter (Signed)
Spoke with patient. Advised per Dr. Sabra Heck. RX to verified pharmacy. Patient request Macrobid be added to allergy list. Medication d/c and allergies updated. Patient verbalizes understanding and is agreeable.   Encounter closed.

## 2019-06-08 NOTE — Telephone Encounter (Signed)
Spoke with patient. Patient was seen in office on 4/22 for UTI symptoms. Started on Macrobid 100 mg bid x5 days. UC pending. Patient took 2 doses of Macrobid on 4/22, one when she picked up the med and again at 2230, took with food. Reports dizziness, headache, chills and dry heaves this morning. Is going to try ginger ale and crackers. Has not taken any Macrobid this morning, requesting alternative.   Advised patient to stop Macrobid, RN will review with Dr. Sabra Heck and return call with recommendations. Patient agreeable.   Dr. Sabra Heck -please advise.

## 2019-06-08 NOTE — Telephone Encounter (Signed)
Patient left a message on the answering machine asking to talk with a nurse about changing her medication.

## 2019-06-09 LAB — URINE CULTURE

## 2019-06-21 NOTE — Progress Notes (Signed)
GYNECOLOGY  VISIT  CC:   Recheck with pessary  HPI: 79 y.o. G37P1001 [name] here for recheck.  Has incomplete uterine prolapse and uses incontinence ring with support pessary.  Denies vaginal bleeding or discharge.    Also, reports her urinary symptoms are resolved.  Urine culture was normal.  Urine micro showed WBCs but no RBCs.    GYNECOLOGIC HISTORY: Patient's last menstrual period was 02/16/1988 (lmp unknown). Contraception: post menopausal Menopausal hormone therapy: estradiol 0.02%  Patient Active Problem List   Diagnosis Date Noted  . Intermittent palpitations 01/17/2017  . Right shoulder pain 12/14/2016  . Right carotid bruit 08/01/2015  . Uterine prolapse 06/13/2013  . Cystocele 06/13/2013  . Ex-cigarette smoker 05/04/2013  . Asthmatic bronchitis 05/04/2013  . 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 ESOPHAGEAL REFLUX 03/24/2010  . 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 COLONIC POLYPS 01/17/2008  . Hypothyroidism 01/17/2008    Past Medical History:  Diagnosis Date  . COPD (chronic obstructive pulmonary disease) ([place])   . History of chicken pox   . HSV-1 infection   . Hx of colonic polyps   . Hypercholesterolemia   . Hypothyroidism   . 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 MVP (mitral valve prolapse)   . Osteopenia   . Prolapsed uterus   . Raynaud disease   . Varicose vein     Past Surgical History:  Procedure Laterality Date  . 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 Patient does not remember exact date of surgery.  [name] CATARACT EXTRACTION Right 10/25/2017  . [place]  . MELANOMA EXCISION  12/31/2010   Procedure: MELANOMA EXCISION;  Surgeon: Zenovia Jarred, MD;  Location: [place] [facility];  Service: General;  Laterality: Left;  wide excision melanoma left thigh, excision lesion left thigh  . OTHER SURGICAL HISTORY Right    Surgical repair of architectual distortion Right Breast- Hyperplasia  .  TONSILLECTOMY      MEDS:   Current Outpatient Medications on File Prior to Visit  Medication Sig Dispense Refill  . acyclovir ointment (ZOVIRAX) 5 % See admin instructions. Prn cold sores  0  . aspirin 81 MG tablet Take 81 mg by mouth once a week.     . Calcium Carbonate-Vitamin D (CALCIUM + D PO) Take by mouth daily.      . Cholecalciferol (VITAMIN D) 2000 UNITS CAPS Take 1 capsule by mouth daily.    . fluticasone (FLONASE) 50 MCG/ACT nasal spray PLACE 2 SPRAYS INTO BOTH NOSTRILS AS NEEDED. 16 g 5  . hydrocortisone 2.5 % cream PLACE 1 APPLICATION RECTALLY 4 TIMES DAILY 28.35 g 0  . levothyroxine (SYNTHROID, LEVOTHROID) 88 MCG tablet Take 1 tablet (88 mcg total) by mouth daily before breakfast. 30 tablet 11  . LUTEIN PO Take 1 tablet by mouth daily.    . Multiple Vitamins-Minerals (PRESERVISION AREDS 2 PO) Take by mouth.    . nicotine polacrilex (NICORETTE) 2 MG gum Take 2 mg by mouth as needed. Not smoking    . NONFORMULARY OR COMPOUNDED ITEM Vaginal estradiol cream 0.02%.  1/2 gram per vaginal at bedtime two to three times weekly. 8 each 11  . valACYclovir (VALTREX) 1000 MG tablet 2 tabs (2gm) x 1, repeat in 12 hours with symptom onset 30 tablet 2   No current facility-administered medications on file prior to visit.    ALLERGIES: Codeine, Ciprofloxacin, Macrobid [nitrofurantoin], and Sulfur  Family History  Problem Relation  Age of Onset  . Hypertension Mother   . Congestive Heart Failure Mother   . Thyroid disease Mother   . Hyperlipidemia Mother   . Heart disease Mother   . Stroke Father   . Heart attack Father 59  . Diabetes Paternal Grandmother   . Diabetes Paternal Grandfather   . Hypertension Maternal Grandfather   . CVA Maternal Grandfather   . Esophageal cancer Neg Hx   . Rectal cancer Neg Hx   . Stomach cancer Neg Hx   . Colon cancer Neg Hx     SH:  Widowed, non smoker  Review of Systems  Constitutional: Negative.   HENT: Negative.   Eyes: Negative.    Respiratory: Negative.   Cardiovascular: Negative.   Gastrointestinal: Negative.   Endocrine: Negative.   Genitourinary: Negative.   Musculoskeletal: Negative.   Skin: Negative.   Allergic/Immunologic: Negative.   Neurological: Negative.   Psychiatric/Behavioral: Negative.     PHYSICAL EXAMINATION:    LMP 02/16/1988 (LMP Unknown)     General appearance: alert, cooperative and appears stated age Lymph:  no inguinal LAD noted  Pelvic: External genitalia:  no lesions              Urethra:  normal appearing urethra with no masses, tenderness or lesions              Bartholins and Skenes: normal                 Vagina: pessary removed (and cleansed), there is a tiny area ~1cm of superficial irritation beneath cervix on vaginal mucosa, otherwise tissue is normal without discharge, no other lesions noted, findings reviewed with pt and pessary replaced              Cervix: no lesions              Bimanual Exam:  Uterus:  normal size, contour, position, consistency, mobility, non-tender              Adnexa: no mass, fullness, tenderness  Chaperone, Terence Lux, CMA, was present for exam.  Assessment: Incomplete uterine prolapse Pessary use Small superficial vaginal irritation as noted in above note Cystocele  Plan: Recheck 4 weeks

## 2019-06-25 ENCOUNTER — Other Ambulatory Visit: Payer: Self-pay

## 2019-06-25 ENCOUNTER — Encounter: Payer: Self-pay | Admitting: Obstetrics & Gynecology

## 2019-06-25 ENCOUNTER — Ambulatory Visit: Payer: Medicare PPO | Admitting: Obstetrics & Gynecology

## 2019-06-25 VITALS — BP 110/70 | HR 64 | Temp 97.5°F | Resp 16 | Wt 115.0 lb

## 2019-06-25 DIAGNOSIS — M85859 Other specified disorders of bone density and structure, unspecified thigh: Secondary | ICD-10-CM

## 2019-06-25 DIAGNOSIS — N8111 Cystocele, midline: Secondary | ICD-10-CM

## 2019-06-25 DIAGNOSIS — N812 Incomplete uterovaginal prolapse: Secondary | ICD-10-CM

## 2019-06-25 DIAGNOSIS — Z4689 Encounter for fitting and adjustment of other specified devices: Secondary | ICD-10-CM

## 2019-06-25 MED ORDER — HYDROCORTISONE 2.5 % EX CREA: TOPICAL_CREAM | CUTANEOUS | 0 refills | Status: DC

## 2019-07-25 ENCOUNTER — Telehealth: Payer: Self-pay

## 2019-07-25 NOTE — Telephone Encounter (Signed)
Patient is calling to change appointment for pessary check to come in sooner. Patient states she is having discharge and other issues.

## 2019-07-25 NOTE — Telephone Encounter (Signed)
Last OV 06/25/19- pessary check H/O incomplete uterine prolapse Had 1 month pessary check scheduled 08/06/19.  Spoke with pt. Pt states having vaginal discharge with odor x 1 week. Pt states thinks pessary needs to come out. Pt requesting OV for recheck in the next two days. Denies fever, chills, any UTI sx, vaginal bleeding or pain. Denies using any OTC meds for treatment.   Pt scheduled for OV with Dr Sabra Heck on 07/27/19 at 3:30 pm per pt's request of date and time.  Pt agreeable and verbalized understanding.  Routing to Dr Sabra Heck for review.  Encounter closed.

## 2019-07-26 NOTE — Progress Notes (Signed)
GYNECOLOGY  VISIT  CC:   Vaginal discharge with odor  HPI: 79 y.o. G33P1001 [name] here for vaginal discharge with odor that she's noticed for a few days.  Pt uses a pessary due to incomplete uterine prolapse and cystocele.  When this occurs, she typically is having some superficial erosion from the pessary.  Denies any urinary symptoms.    GYNECOLOGIC HISTORY: Patient's last menstrual period was 02/16/1988 (lmp unknown). Contraception: post menopausal Menopausal hormone therapy: estradiol 0.02 cream  Patient Active Problem List   Diagnosis Date Noted  . Intermittent palpitations 01/17/2017  . Right shoulder pain 12/14/2016  . Right carotid bruit 08/01/2015  . Uterine prolapse 06/13/2013  . Cystocele 06/13/2013  . Ex-cigarette smoker 05/04/2013  . Asthmatic bronchitis 05/04/2013  . 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 ESOPHAGEAL REFLUX 03/24/2010  . 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 COLONIC POLYPS 01/17/2008  . Hypothyroidism 01/17/2008    Past Medical History:  Diagnosis Date  . COPD (chronic obstructive pulmonary disease) ([place])   . History of chicken pox   . HSV-1 infection   . Hx of colonic polyps   . Hypercholesterolemia   . Hypothyroidism   . 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 MVP (mitral valve prolapse)   . Osteopenia   . Prolapsed uterus   . Raynaud disease   . Varicose vein     Past Surgical History:  Procedure Laterality Date  . 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 Patient does not remember exact date of surgery.  [name] CATARACT EXTRACTION Right 10/25/2017  . [place]  . MELANOMA EXCISION  12/31/2010   Procedure: MELANOMA EXCISION;  Surgeon: Zenovia Jarred, MD;  Location: [facility];  Service: General;  Laterality: Left;  wide excision melanoma left thigh, excision lesion left thigh  . OTHER SURGICAL HISTORY Right    Surgical repair of architectual distortion Right Breast-  Hyperplasia  . TONSILLECTOMY      MEDS:   Current Outpatient Medications on File Prior to Visit  Medication Sig Dispense Refill  . acyclovir ointment (ZOVIRAX) 5 % See admin instructions. Prn cold sores  0  . aspirin 81 MG tablet Take 81 mg by mouth once a week.     . Calcium Carbonate-Vitamin D (CALCIUM + D PO) Take by mouth daily.      . Cholecalciferol (VITAMIN D) 2000 UNITS CAPS Take 1 capsule by mouth daily.    . fluticasone (FLONASE) 50 MCG/ACT nasal spray PLACE 2 SPRAYS INTO BOTH NOSTRILS AS NEEDED. 16 g 5  . hydrocortisone 2.5 % cream Apply topically every 6 hours as needed 28 g 0  . levothyroxine (SYNTHROID, LEVOTHROID) 88 MCG tablet Take 1 tablet (88 mcg total) by mouth daily before breakfast. 30 tablet 11  . LUTEIN PO Take 1 tablet by mouth daily.    . Multiple Vitamin (MULTIVITAMIN PO) Take by mouth.    . Multiple Vitamins-Minerals (PRESERVISION AREDS 2 PO) Take by mouth.    . nicotine polacrilex (NICORETTE) 2 MG gum Take 2 mg by mouth as needed. Not smoking    . NONFORMULARY OR COMPOUNDED ITEM Vaginal estradiol cream 0.02%.  1/2 gram per vaginal at bedtime two to three times weekly. 8 each 11  . valACYclovir (VALTREX) 1000 MG tablet 2 tabs (2gm) x 1, repeat in 12 hours with symptom onset 30 tablet 2   No current facility-administered medications on file prior to visit.  ALLERGIES: Codeine, Ciprofloxacin, Macrobid [nitrofurantoin], and Sulfur  Family History  Problem Relation Age of Onset  . Hypertension Mother   . Congestive Heart Failure Mother   . Thyroid disease Mother   . Hyperlipidemia Mother   . Heart disease Mother   . Stroke Father   . Heart attack Father 52  . Diabetes Paternal Grandmother   . Diabetes Paternal Grandfather   . Hypertension Maternal Grandfather   . CVA Maternal Grandfather   . Esophageal cancer Neg Hx   . Rectal cancer Neg Hx   . Stomach cancer Neg Hx   . Colon cancer Neg Hx     SH:  Widowed, non smoker  Review of Systems   Constitutional: Negative.   HENT: Negative.   Eyes: Negative.   Respiratory: Negative.   Cardiovascular: Negative.   Gastrointestinal: Negative.   Endocrine: Negative.   Genitourinary:       Vaginal discharge with odor  Musculoskeletal: Negative.   Skin: Negative.   Allergic/Immunologic: Negative.   Neurological: Negative.   Hematological: Negative.   Psychiatric/Behavioral: Negative.     PHYSICAL EXAMINATION:    BP 122/80   Pulse 70   Temp (!) 97 F (36.1 C) (Skin)   Resp 16   Wt 114 lb (51.7 kg)   LMP 02/16/1988 (LMP Unknown)   BMI 20.85 kg/m     General appearance: alert, cooperative and appears stated age Lymph:  no inguinal LAD noted  Pelvic: External genitalia:  no lesions              Urethra:  normal appearing urethra with no masses, tenderness or lesions              Bartholins and Skenes: normal                 Vagina: pessary removed, beneath cervix is a small area of superficial erythema and discharge              Cervix: no lesions              Bimanual Exam:  Uterus:  normal size, contour, position, consistency, mobility, non-tender              Adnexa: no mass, fullness, tenderness  Chaperone, Terence Lux, CMA, was present for exam.  Assessment: Superficial vaginal erosion from pessary Incomplete prolapse cystocele  Plan: Pessary will be left out for two weeks and then pt will return for recheck and, hopefully, pessary replacement

## 2019-07-27 ENCOUNTER — Ambulatory Visit: Payer: Self-pay | Admitting: Obstetrics & Gynecology

## 2019-07-27 ENCOUNTER — Ambulatory Visit: Payer: Medicare PPO | Admitting: Obstetrics & Gynecology

## 2019-07-27 ENCOUNTER — Other Ambulatory Visit: Payer: Self-pay

## 2019-07-27 ENCOUNTER — Encounter: Payer: Self-pay | Admitting: Obstetrics & Gynecology

## 2019-07-27 VITALS — BP 122/80 | HR 70 | Temp 97.0°F | Resp 16 | Wt 114.0 lb

## 2019-07-27 DIAGNOSIS — N812 Incomplete uterovaginal prolapse: Secondary | ICD-10-CM | POA: Diagnosis not present

## 2019-07-27 DIAGNOSIS — N8111 Cystocele, midline: Secondary | ICD-10-CM

## 2019-07-27 DIAGNOSIS — N765 Ulceration of vagina: Secondary | ICD-10-CM | POA: Diagnosis not present

## 2019-07-27 DIAGNOSIS — Z4689 Encounter for fitting and adjustment of other specified devices: Secondary | ICD-10-CM | POA: Diagnosis not present

## 2019-07-30 DIAGNOSIS — H5203 Hypermetropia, bilateral: Secondary | ICD-10-CM | POA: Diagnosis not present

## 2019-07-30 DIAGNOSIS — H26493 Other secondary cataract, bilateral: Secondary | ICD-10-CM | POA: Diagnosis not present

## 2019-07-30 DIAGNOSIS — Z961 Presence of intraocular lens: Secondary | ICD-10-CM | POA: Diagnosis not present

## 2019-07-30 DIAGNOSIS — H35363 Drusen (degenerative) of macula, bilateral: Secondary | ICD-10-CM | POA: Diagnosis not present

## 2019-08-06 ENCOUNTER — Ambulatory Visit: Payer: Self-pay | Admitting: Obstetrics & Gynecology

## 2019-08-09 NOTE — Progress Notes (Signed)
GYNECOLOGY  VISIT  CC:   Pessary replacement  HPI: 79 y.o. G31P1001 [name] here for f/u.  pessary removed for 2 weeks last visit.  Discharge has resolved.  Denies vaginal bleeding.    GYNECOLOGIC HISTORY: Patient's last menstrual period was 02/16/1988 (lmp unknown). Contraception: post menopausal Menopausal hormone therapy: estradiol 0.02% cream  Patient Active Problem List   Diagnosis Date Noted  . Intermittent palpitations 01/17/2017  . Right shoulder pain 12/14/2016  . Right carotid bruit 08/01/2015  . Uterine prolapse 06/13/2013  . Cystocele 06/13/2013  . Ex-cigarette smoker 05/04/2013  . Asthmatic bronchitis 05/04/2013  . 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 ESOPHAGEAL REFLUX 03/24/2010  . 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 COLONIC POLYPS 01/17/2008  . Hypothyroidism 01/17/2008    Past Medical History:  Diagnosis Date  . COPD (chronic obstructive pulmonary disease) ([place])   . History of chicken pox   . HSV-1 infection   . Hx of colonic polyps   . Hypercholesterolemia   . Hypothyroidism   . 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 MVP (mitral valve prolapse)   . Osteopenia   . Prolapsed uterus   . Raynaud disease   . Varicose vein     Past Surgical History:  Procedure Laterality Date  . 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 Patient does not remember exact date of surgery.  [name] CATARACT EXTRACTION Right 10/25/2017  . [place]  . MELANOMA EXCISION  12/31/2010   Procedure: MELANOMA EXCISION;  Surgeon: Zenovia Jarred, MD;  Location: [facility];  Service: General;  Laterality: Left;  wide excision melanoma left thigh, excision lesion left thigh  . OTHER SURGICAL HISTORY Right    Surgical repair of architectual distortion Right Breast- Hyperplasia  . TONSILLECTOMY      MEDS:   Current Outpatient Medications on File Prior to Visit  Medication Sig Dispense Refill  . acyclovir ointment (ZOVIRAX)  5 % See admin instructions. Prn cold sores  0  . aspirin 81 MG tablet Take 81 mg by mouth once a week.     . Calcium Carbonate-Vitamin D (CALCIUM + D PO) Take by mouth daily.      . Cholecalciferol (VITAMIN D) 2000 UNITS CAPS Take 1 capsule by mouth daily.    . fluticasone (FLONASE) 50 MCG/ACT nasal spray PLACE 2 SPRAYS INTO BOTH NOSTRILS AS NEEDED. 16 g 5  . hydrocortisone 2.5 % cream Apply topically every 6 hours as needed 28 g 0  . levothyroxine (SYNTHROID, LEVOTHROID) 88 MCG tablet Take 1 tablet (88 mcg total) by mouth daily before breakfast. 30 tablet 11  . LUTEIN PO Take 1 tablet by mouth daily.    . Multiple Vitamin (MULTIVITAMIN PO) Take by mouth.    . Multiple Vitamins-Minerals (PRESERVISION AREDS 2 PO) Take by mouth.    . nicotine polacrilex (NICORETTE) 2 MG gum Take 2 mg by mouth as needed. Not smoking    . NONFORMULARY OR COMPOUNDED ITEM Vaginal estradiol cream 0.02%.  1/2 gram per vaginal at bedtime two to three times weekly. 8 each 11  . valACYclovir (VALTREX) 1000 MG tablet 2 tabs (2gm) x 1, repeat in 12 hours with symptom onset 30 tablet 2   No current facility-administered medications on file prior to visit.    ALLERGIES: Codeine, Ciprofloxacin, Macrobid [nitrofurantoin], and Sulfur  Family History  Problem Relation Age of Onset  . Hypertension Mother   . Congestive Heart Failure Mother   .  Thyroid disease Mother   . Hyperlipidemia Mother   . Heart disease Mother   . Stroke Father   . Heart attack Father 52  . Diabetes Paternal Grandmother   . Diabetes Paternal Grandfather   . Hypertension Maternal Grandfather   . CVA Maternal Grandfather   . Esophageal cancer Neg Hx   . Rectal cancer Neg Hx   . Stomach cancer Neg Hx   . Colon cancer Neg Hx     SH:  Widowed, non smoker  Review of Systems  Constitutional: Negative.   HENT: Negative.   Eyes: Negative.   Respiratory: Negative.   Cardiovascular: Negative.   Gastrointestinal: Negative.   Endocrine: Negative.    Genitourinary: Negative.   Musculoskeletal: Negative.   Skin: Negative.   Allergic/Immunologic: Negative.   Neurological: Negative.   Hematological: Negative.   Psychiatric/Behavioral: Negative.     PHYSICAL EXAMINATION:    BP 130/80   Pulse 76   Temp (!) 97 F (36.1 C) (Skin)   Resp 16   Wt 114 lb (51.7 kg)   LMP 02/16/1988 (LMP Unknown)   BMI 20.85 kg/m     General appearance: alert, cooperative and appears stated age Lymph:  no inguinal LAD noted  Pelvic: External genitalia:  no lesions              Urethra:  normal appearing urethra with no masses, tenderness or lesions              Bartholins and Skenes: normal                 Vagina: normal appearing vagina with normal color and discharge, very superficial erythematous lesion that is much improved beneath her cervix, cystocele with incomplete uterine prolapse               Cervix: no lesions              Bimanual Exam:  Uterus:  normal size, contour, position, consistency, mobility, non-tender              Adnexa: no mass, fullness, tenderness  Chaperone, Terence Lux, CMA, was present for exam.  Assessment: Vaginal ulceration that is much improved Incomplete uterine prolapse  Plan: Recheck in another 10 days or so for pessary placement

## 2019-08-10 ENCOUNTER — Other Ambulatory Visit: Payer: Self-pay

## 2019-08-10 ENCOUNTER — Encounter: Payer: Self-pay | Admitting: Obstetrics & Gynecology

## 2019-08-10 ENCOUNTER — Ambulatory Visit: Payer: Medicare PPO | Admitting: Obstetrics & Gynecology

## 2019-08-10 VITALS — BP 130/80 | HR 76 | Temp 97.0°F | Resp 16 | Wt 114.0 lb

## 2019-08-10 DIAGNOSIS — N812 Incomplete uterovaginal prolapse: Secondary | ICD-10-CM

## 2019-08-10 DIAGNOSIS — N8111 Cystocele, midline: Secondary | ICD-10-CM | POA: Diagnosis not present

## 2019-08-13 ENCOUNTER — Telehealth: Payer: Self-pay

## 2019-08-13 NOTE — Telephone Encounter (Addendum)
Left message to call [place] at [phone].  Dr.Miller would like to see the patient on 08/21/2019 at 10:15 am.

## 2019-08-14 NOTE — Telephone Encounter (Signed)
Spoke with patient. Appointment made for 08/21/2019 at 10:15 am with Dr.Miller. Patient is agreeable to date and time. Encounter closed.

## 2019-08-15 NOTE — Progress Notes (Signed)
GYNECOLOGY  VISIT  CC:   Pessary placement  HPI: 79 y.o. G89P1001 [name] here for pessary insertion.  Denies vaginal discharge or odor.  Denies vaginal bleeding.    GYNECOLOGIC HISTORY: Patient's last menstrual period was 02/16/1988 (lmp unknown). Contraception: post menopausal Menopausal hormone therapy: vaginal estradiol cream 0.02%  Patient Active Problem List   Diagnosis Date Noted  . Intermittent palpitations 01/17/2017  . Right shoulder pain 12/14/2016  . Right carotid bruit 08/01/2015  . Uterine prolapse 06/13/2013  . Cystocele 06/13/2013  . Ex-cigarette smoker 05/04/2013  . Asthmatic bronchitis 05/04/2013  . 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 ESOPHAGEAL REFLUX 03/24/2010  . 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 COLONIC POLYPS 01/17/2008  . Hypothyroidism 01/17/2008    Past Medical History:  Diagnosis Date  . COPD (chronic obstructive pulmonary disease) ([place])   . History of chicken pox   . HSV-1 infection   . Hx of colonic polyps   . Hypercholesterolemia   . Hypothyroidism   . 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 MVP (mitral valve prolapse)   . Osteopenia   . Prolapsed uterus   . Raynaud disease   . Varicose vein     Past Surgical History:  Procedure Laterality Date  . 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 Patient does not remember exact date of surgery.  [name] CATARACT EXTRACTION Right 10/25/2017  . [place]  . MELANOMA EXCISION  12/31/2010   Procedure: MELANOMA EXCISION;  Surgeon: Zenovia Jarred, MD;  Location: [facility];  Service: General;  Laterality: Left;  wide excision melanoma left thigh, excision lesion left thigh  . OTHER SURGICAL HISTORY Right    Surgical repair of architectual distortion Right Breast- Hyperplasia  . TONSILLECTOMY      MEDS:   Current Outpatient Medications on File Prior to Visit  Medication Sig Dispense Refill  . acyclovir ointment (ZOVIRAX) 5 % See  admin instructions. Prn cold sores  0  . aspirin 81 MG tablet Take 81 mg by mouth once a week.     . Calcium Carbonate-Vitamin D (CALCIUM + D PO) Take by mouth daily.      . Cholecalciferol (VITAMIN D) 2000 UNITS CAPS Take 1 capsule by mouth daily.    . fluticasone (FLONASE) 50 MCG/ACT nasal spray PLACE 2 SPRAYS INTO BOTH NOSTRILS AS NEEDED. 16 g 5  . hydrocortisone 2.5 % cream Apply topically every 6 hours as needed 28 g 0  . levothyroxine (SYNTHROID, LEVOTHROID) 88 MCG tablet Take 1 tablet (88 mcg total) by mouth daily before breakfast. 30 tablet 11  . LUTEIN PO Take 1 tablet by mouth daily.    . Multiple Vitamin (MULTIVITAMIN PO) Take by mouth.    . Multiple Vitamins-Minerals (PRESERVISION AREDS 2 PO) Take by mouth.    . nicotine polacrilex (NICORETTE) 2 MG gum Take 2 mg by mouth as needed. Not smoking    . NONFORMULARY OR COMPOUNDED ITEM Vaginal estradiol cream 0.02%.  1/2 gram per vaginal at bedtime two to three times weekly. 8 each 11  . valACYclovir (VALTREX) 1000 MG tablet 2 tabs (2gm) x 1, repeat in 12 hours with symptom onset 30 tablet 2   No current facility-administered medications on file prior to visit.    ALLERGIES: Codeine, Ciprofloxacin, Macrobid [nitrofurantoin], and Sulfur  Family History  Problem Relation Age of Onset  . Hypertension Mother   . Congestive Heart Failure Mother   . Thyroid  disease Mother   . Hyperlipidemia Mother   . Heart disease Mother   . Stroke Father   . Heart attack Father 68  . Diabetes Paternal Grandmother   . Diabetes Paternal Grandfather   . Hypertension Maternal Grandfather   . CVA Maternal Grandfather   . Esophageal cancer Neg Hx   . Rectal cancer Neg Hx   . Stomach cancer Neg Hx   . Colon cancer Neg Hx     SH:  Widowed, non smoker  Review of Systems  Constitutional: Negative.   HENT: Negative.   Eyes: Negative.   Respiratory: Negative.   Cardiovascular: Negative.   Gastrointestinal: Negative.   Endocrine: Negative.    Genitourinary: Negative.   Musculoskeletal: Negative.   Skin: Negative.   Allergic/Immunologic: Negative.   Neurological: Negative.   Hematological: Negative.   Psychiatric/Behavioral: Negative.     PHYSICAL EXAMINATION:    BP 130/78   Pulse 76   Resp 16   Wt 114 lb (51.7 kg)   LMP 02/16/1988 (LMP Unknown)   BMI 20.85 kg/m     General appearance: alert, cooperative and appears stated age Lymph:  no inguinal LAD noted  Pelvic: External genitalia:  no lesions              Urethra:  normal appearing urethra with no masses, tenderness or lesions              Bartholins and Skenes: normal                 Vagina: normal appearing vagina with normal color and discharge, no lesions, ulceration is fully healed              Cervix: no lesions              Bimanual Exam:  Uterus:  normal size, contour, position, consistency, mobility, non-tender              Pessary replaced without difficulty.  Small amount of estradiol cream placed prior to insertion.  Chaperone, Royal Hawthorn, CMA, was present for exam.  Assessment: Incomplete uterine prolapse with cystocele Pessary use hemorrhoids  Plan: Pessary placed today.  Return 1 month. Rx for anusol HC topically three times daily as needed for hemorrhoids.

## 2019-08-21 ENCOUNTER — Telehealth: Payer: Self-pay | Admitting: *Deleted

## 2019-08-21 ENCOUNTER — Encounter: Payer: Self-pay | Admitting: Obstetrics & Gynecology

## 2019-08-21 ENCOUNTER — Other Ambulatory Visit: Payer: Self-pay

## 2019-08-21 ENCOUNTER — Ambulatory Visit: Payer: Medicare PPO | Admitting: Obstetrics & Gynecology

## 2019-08-21 VITALS — BP 130/78 | HR 76 | Resp 16 | Wt 114.0 lb

## 2019-08-21 DIAGNOSIS — N8111 Cystocele, midline: Secondary | ICD-10-CM

## 2019-08-21 DIAGNOSIS — N812 Incomplete uterovaginal prolapse: Secondary | ICD-10-CM

## 2019-08-21 MED ORDER — HYDROCORTISONE 2.5 % EX CREA: TOPICAL_CREAM | Freq: Three times a day (TID) | CUTANEOUS | 3 refills | Status: DC

## 2019-08-21 NOTE — Telephone Encounter (Signed)
Patient will need a 1 month pessary recheck. Sending to triage to assist with scheduling.

## 2019-08-21 NOTE — Telephone Encounter (Signed)
Call placed to pt. Pt was seen today by Dr Sabra Heck and pt needs 1 month pessary recheck appt. Pt scheduled for 8/6 at 130 pm. Pt agreeable and verbalized understanding of date and time of appt.   Routing to Dr Sabra Heck for review.  Encounter closed.

## 2019-09-20 NOTE — Progress Notes (Signed)
GYNECOLOGY  VISIT  CC:   Pessary removal/recheck  HPI: 79 y.o. G45P1001 [name] here for pessary recheck.  Is having some odor and discharge which typically means she's developing a vaginal irritation/superficial ulceration due to the pessary.  Has trip scheduled in September and wants to be able to use her pessary then.  She cannot remove and replace this herself.  GYNECOLOGIC HISTORY: Patient's last menstrual period was 02/16/1988 (lmp unknown). Contraception: post menopausal Menopausal hormone therapy: 0.02% vaginal estradiol cream  Patient Active Problem List   Diagnosis Date Noted  . Intermittent palpitations 01/17/2017  . Right shoulder pain 12/14/2016  . Right carotid bruit 08/01/2015  . Uterine prolapse 06/13/2013  . Cystocele 06/13/2013  . Ex-cigarette smoker 05/04/2013  . Asthmatic bronchitis 05/04/2013  . 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 ESOPHAGEAL REFLUX 03/24/2010  . 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 COLONIC POLYPS 01/17/2008  . Hypothyroidism 01/17/2008    Past Medical History:  Diagnosis Date  . COPD (chronic obstructive pulmonary disease) ([place])   . History of chicken pox   . HSV-1 infection   . Hx of colonic polyps   . Hypercholesterolemia   . Hypothyroidism   . 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 MVP (mitral valve prolapse)   . Osteopenia   . Prolapsed uterus   . Raynaud disease   . Varicose vein     Past Surgical History:  Procedure Laterality Date  . 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 Patient does not remember exact date of surgery.  [name] CATARACT EXTRACTION Right 10/25/2017  . [place]  . MELANOMA EXCISION  12/31/2010   Procedure: MELANOMA EXCISION;  Surgeon: Zenovia Jarred, MD;  Location: [facility];  Service: General;  Laterality: Left;  wide excision melanoma left thigh, excision lesion left thigh  . OTHER SURGICAL HISTORY Right    Surgical repair of architectual  distortion Right Breast- Hyperplasia  . TONSILLECTOMY      MEDS:   Current Outpatient Medications on File Prior to Visit  Medication Sig Dispense Refill  . aspirin 81 MG tablet Take 81 mg by mouth once a week.     . Calcium Carbonate-Vitamin D (CALCIUM + D PO) Take by mouth daily.      . Cholecalciferol (VITAMIN D) 2000 UNITS CAPS Take 1 capsule by mouth daily.    . fluticasone (FLONASE) 50 MCG/ACT nasal spray PLACE 2 SPRAYS INTO BOTH NOSTRILS AS NEEDED. 16 g 5  . hydrocortisone 2.5 % cream Apply topically 3 (three) times daily. Use as needed. 28 g 3  . IBUPROFEN PO Take 200 mg by mouth. At bedtime    . levothyroxine (SYNTHROID, LEVOTHROID) 88 MCG tablet Take 1 tablet (88 mcg total) by mouth daily before breakfast. 30 tablet 11  . LUTEIN PO Take 1 tablet by mouth daily.    . Multiple Vitamin (MULTIVITAMIN PO) Take by mouth.    . Multiple Vitamins-Minerals (PRESERVISION AREDS 2 PO) Take by mouth.    . nicotine polacrilex (NICORETTE) 2 MG gum Take 2 mg by mouth as needed. Not smoking    . NONFORMULARY OR COMPOUNDED ITEM Vaginal estradiol cream 0.02%.  1/2 gram per vaginal at bedtime two to three times weekly. 8 each 11  . acyclovir ointment (ZOVIRAX) 5 % See admin instructions. Prn cold sores (Patient not taking: Reported on 09/21/2019)  0  . valACYclovir (VALTREX) 1000 MG tablet 2 tabs (2gm) x 1, repeat in  12 hours with symptom onset (Patient not taking: Reported on 09/21/2019) 30 tablet 2   No current facility-administered medications on file prior to visit.    ALLERGIES: Codeine, Ciprofloxacin, Macrobid [nitrofurantoin], and Sulfur  Family History  Problem Relation Age of Onset  . Hypertension Mother   . Congestive Heart Failure Mother   . Thyroid disease Mother   . Hyperlipidemia Mother   . Heart disease Mother   . Stroke Father   . Heart attack Father 45  . Diabetes Paternal Grandmother   . Diabetes Paternal Grandfather   . Hypertension Maternal Grandfather   . CVA Maternal  Grandfather   . Esophageal cancer Neg Hx   . Rectal cancer Neg Hx   . Stomach cancer Neg Hx   . Colon cancer Neg Hx     SH:  Widowed, non smoker  Review of Systems  Genitourinary: Positive for vaginal discharge.       Vaginal odor    PHYSICAL EXAMINATION:    BP 120/76   Pulse 68   Resp 16   Wt 115 lb (52.2 kg)   LMP 02/16/1988 (LMP Unknown)   BMI 21.03 kg/m     General appearance: alert, cooperative and appears stated age Lymph:  no inguinal LAD noted  Pelvic: External genitalia:  no lesions              Urethra:  normal appearing urethra with no masses, tenderness or lesions              Bartholins and Skenes: normal                 Vagina: beneath cervix (in same location where this always occurs) is a superficial ulceration that bleeds when touched with scopette              Cervix: no lesions              Bimanual Exam:  Uterus:  normal size, contour, position, consistency, mobility, non-tender              Adnexa: no mass, fullness, tenderness              Chaperone, Royal Hawthorn, CMA, was present for exam.  Assessment: Incomplete uterine prolapse with cystocele Pessary use Vaginal discharge/odor Ulceration from pessary  Plan: Will leave out, have sterilized and then replaced in about a month.  Pt comfortable with plan.

## 2019-09-21 ENCOUNTER — Other Ambulatory Visit: Payer: Self-pay

## 2019-09-21 ENCOUNTER — Encounter: Payer: Self-pay | Admitting: Obstetrics & Gynecology

## 2019-09-21 ENCOUNTER — Telehealth: Payer: Self-pay | Admitting: Obstetrics & Gynecology

## 2019-09-21 ENCOUNTER — Ambulatory Visit: Payer: Medicare PPO | Admitting: Obstetrics & Gynecology

## 2019-09-21 VITALS — BP 120/76 | HR 68 | Resp 16 | Wt 115.0 lb

## 2019-09-21 DIAGNOSIS — Z960 Presence of urogenital implants: Secondary | ICD-10-CM

## 2019-09-21 DIAGNOSIS — N765 Ulceration of vagina: Secondary | ICD-10-CM | POA: Diagnosis not present

## 2019-09-21 DIAGNOSIS — N812 Incomplete uterovaginal prolapse: Secondary | ICD-10-CM | POA: Diagnosis not present

## 2019-09-21 DIAGNOSIS — N8111 Cystocele, midline: Secondary | ICD-10-CM | POA: Diagnosis not present

## 2019-09-21 NOTE — Telephone Encounter (Signed)
OV scheduled for 10/23/19 at 4:30pm with Dr. Sabra Heck.   Call returned to patient, left appt details on message. Advised to return call to office if any changes.   Patient returned call, request morning or mid afternoon appt.  Appt r/s to 9/16 at 1pm with Dr. Sabra Heck.   Encounter closed.

## 2019-09-21 NOTE — Telephone Encounter (Signed)
Patient need a pessary check around 9/10 with Dr Sabra Heck. Sending to triage to assist with scheduling.

## 2019-10-23 ENCOUNTER — Ambulatory Visit: Payer: Self-pay | Admitting: Obstetrics & Gynecology

## 2019-10-30 NOTE — Progress Notes (Signed)
GYNECOLOGY  VISIT  CC:   Pessary placement  HPI: 79 y.o. G52P1001 [name] here for 1 month follow up to insert pessary.  Denies vaginal bleeding or discharge.  Reports having increased urinary frequency but that is typical for her when she does not have the pessary in place.  Denies dysuria.    GYNECOLOGIC HISTORY: Patient's last menstrual period was 02/16/1988 (lmp unknown). Contraception: post menopausal Menopausal hormone therapy: 0.02% vaginal estradiol cream  Patient Active Problem List   Diagnosis Date Noted  . Intermittent palpitations 01/17/2017  . Right shoulder pain 12/14/2016  . Right carotid bruit 08/01/2015  . Uterine prolapse 06/13/2013  . Cystocele 06/13/2013  . Ex-cigarette smoker 05/04/2013  . Asthmatic bronchitis 05/04/2013  . 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 ESOPHAGEAL REFLUX 03/24/2010  . 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 COLONIC POLYPS 01/17/2008  . Hypothyroidism 01/17/2008    Past Medical History:  Diagnosis Date  . COPD (chronic obstructive pulmonary disease) ([place])   . History of chicken pox   . HSV-1 infection   . Hx of colonic polyps   . Hypercholesterolemia   . Hypothyroidism   . 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 MVP (mitral valve prolapse)   . Osteopenia   . Prolapsed uterus   . Raynaud disease   . Varicose vein     Past Surgical History:  Procedure Laterality Date  . 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 Patient does not remember exact date of surgery.  [name] CATARACT EXTRACTION Right 10/25/2017  . [place]  . MELANOMA EXCISION  12/31/2010   Procedure: MELANOMA EXCISION;  Surgeon: Zenovia Jarred, MD;  Location: [facility];  Service: General;  Laterality: Left;  wide excision melanoma left thigh, excision lesion left thigh  . OTHER SURGICAL HISTORY Right    Surgical repair of architectual distortion Right Breast- Hyperplasia  . TONSILLECTOMY      MEDS:    Current Outpatient Medications on File Prior to Visit  Medication Sig Dispense Refill  . acyclovir ointment (ZOVIRAX) 5 % See admin instructions. Prn cold sores  0  . aspirin 81 MG tablet Take 81 mg by mouth once a week.     . Calcium Carbonate-Vitamin D (CALCIUM + D PO) Take by mouth daily.      . Cholecalciferol (VITAMIN D) 2000 UNITS CAPS Take 1 capsule by mouth daily.    . fluticasone (FLONASE) 50 MCG/ACT nasal spray PLACE 2 SPRAYS INTO BOTH NOSTRILS AS NEEDED. 16 g 5  . hydrocortisone 2.5 % cream Apply topically 3 (three) times daily. Use as needed. 28 g 3  . IBUPROFEN PO Take 200 mg by mouth. At bedtime    . levothyroxine (SYNTHROID, LEVOTHROID) 88 MCG tablet Take 1 tablet (88 mcg total) by mouth daily before breakfast. 30 tablet 11  . LUTEIN PO Take 1 tablet by mouth daily.    . Multiple Vitamin (MULTIVITAMIN PO) Take by mouth.    . Multiple Vitamins-Minerals (PRESERVISION AREDS 2 PO) Take by mouth.    . nicotine polacrilex (NICORETTE) 2 MG gum Take 2 mg by mouth as needed. Not smoking    . NONFORMULARY OR COMPOUNDED ITEM Vaginal estradiol cream 0.02%.  1/2 gram per vaginal at bedtime two to three times weekly. 8 each 11  . valACYclovir (VALTREX) 1000 MG tablet 2 tabs (2gm) x 1, repeat in 12 hours with symptom onset 30 tablet 2   No current facility-administered  medications on file prior to visit.    ALLERGIES: Codeine, Ciprofloxacin, Macrobid [nitrofurantoin], and Sulfur  Family History  Problem Relation Age of Onset  . Hypertension Mother   . Congestive Heart Failure Mother   . Thyroid disease Mother   . Hyperlipidemia Mother   . Heart disease Mother   . Stroke Father   . Heart attack Father 31  . Diabetes Paternal Grandmother   . Diabetes Paternal Grandfather   . Hypertension Maternal Grandfather   . CVA Maternal Grandfather   . Esophageal cancer Neg Hx   . Rectal cancer Neg Hx   . Stomach cancer Neg Hx   . Colon cancer Neg Hx     SH:  Widowed, non  smoker  Review of Systems  All other systems reviewed and are negative.   PHYSICAL EXAMINATION:    BP 118/70   Pulse 68   Resp 16   Wt 116 lb (52.6 kg)   LMP 02/16/1988 (LMP Unknown)   BMI 21.22 kg/m     General appearance: alert, cooperative and appears stated age Lymph:  no inguinal LAD noted  Pelvic: External genitalia:  no lesions              Urethra:  normal appearing urethra with no masses, tenderness or lesions              Bartholins and Skenes: normal                 Vagina: area beneath the cervix where erythema was present is fully resolved, otherwise normal color, no discharge, no lesions              Cervix: no lesions              Bimanual Exam:  Uterus:  normal size, contour, position, consistency, mobility, non-tender              Adnexa: no mass, fullness, tenderness  Pessary replaced without difficulty  Chaperone, Olene Floss, CMA, was present for exam.  Assessment: Incomplete uterine prolapse with cystocele Resolution of vaginal ulceration  Plan: Pessary replaced today.  Recheck 1 month.

## 2019-11-01 ENCOUNTER — Encounter: Payer: Self-pay | Admitting: Obstetrics & Gynecology

## 2019-11-01 ENCOUNTER — Other Ambulatory Visit: Payer: Self-pay

## 2019-11-01 ENCOUNTER — Ambulatory Visit: Payer: Medicare PPO | Admitting: Obstetrics & Gynecology

## 2019-11-01 VITALS — BP 118/70 | HR 68 | Resp 16 | Wt 116.0 lb

## 2019-11-01 DIAGNOSIS — N812 Incomplete uterovaginal prolapse: Secondary | ICD-10-CM | POA: Diagnosis not present

## 2019-11-01 DIAGNOSIS — N8111 Cystocele, midline: Secondary | ICD-10-CM | POA: Diagnosis not present

## 2019-11-19 ENCOUNTER — Telehealth: Payer: Self-pay

## 2019-11-19 NOTE — Telephone Encounter (Signed)
Patient states she is needing a pessary check for October.

## 2019-11-19 NOTE — Telephone Encounter (Signed)
Spoke with patient. Calling to schedule 1 mo pessary recheck. Denies any current concerns.  OV scheduled for 10/25 at 4:30pm with Dr. Sabra Heck.  Patient is agreeable to date and time.  Encounter closed.

## 2019-11-22 DIAGNOSIS — H26493 Other secondary cataract, bilateral: Secondary | ICD-10-CM | POA: Diagnosis not present

## 2019-11-22 DIAGNOSIS — Z961 Presence of intraocular lens: Secondary | ICD-10-CM | POA: Diagnosis not present

## 2019-11-27 ENCOUNTER — Ambulatory Visit: Payer: Medicare PPO | Attending: Internal Medicine

## 2019-11-27 DIAGNOSIS — Z23 Encounter for immunization: Secondary | ICD-10-CM

## 2019-11-27 NOTE — Progress Notes (Signed)
   Covid-19 Vaccination Clinic  Name:  Ariel Holmes    MRN: [id number] DOB: 1940-06-19  11/27/2019  Ariel Holmes was observed post Covid-19 immunization for 15 minutes without incident. She was provided with Vaccine Information Sheet and instruction to access the V-Safe system.   Ariel Holmes was instructed to call 911 with any severe reactions post vaccine: [name] Difficulty breathing  . Swelling of face and throat  . A fast heartbeat  . A bad rash all over body  . Dizziness and weakness

## 2019-12-06 NOTE — Progress Notes (Signed)
GYNECOLOGY  VISIT  CC:   Pessary check  HPI: 79 y.o. G31P1001 [name] here for pessary check.  She has noticed some vaginal discharge and a little blood.  Is also having some odor.  Denies urinary symptoms.   GYNECOLOGIC HISTORY: Patient's last menstrual period was 02/16/1988 (lmp unknown). Contraception: post menopausal Menopausal hormone therapy: none  Patient Active Problem List   Diagnosis Date Noted  . Intermittent palpitations 01/17/2017  . Right shoulder pain 12/14/2016  . Right carotid bruit 08/01/2015  . Uterine prolapse 06/13/2013  . Cystocele 06/13/2013  . Ex-cigarette smoker 05/04/2013  . Asthmatic bronchitis 05/04/2013  . 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 ESOPHAGEAL REFLUX 03/24/2010  . 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 COLONIC POLYPS 01/17/2008  . Hypothyroidism 01/17/2008    Past Medical History:  Diagnosis Date  . COPD (chronic obstructive pulmonary disease) ([place])   . History of chicken pox   . HSV-1 infection   . Hx of colonic polyps   . Hypercholesterolemia   . Hypothyroidism   . 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 MVP (mitral valve prolapse)   . Osteopenia   . Prolapsed uterus   . Raynaud disease   . Varicose vein     Past Surgical History:  Procedure Laterality Date  . 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 Patient does not remember exact date of surgery.  [name] CATARACT EXTRACTION Right 10/25/2017  . [place]  . MELANOMA EXCISION  12/31/2010   Procedure: MELANOMA EXCISION;  Surgeon: Zenovia Jarred, MD;  Location: [facility];  Service: General;  Laterality: Left;  wide excision melanoma left thigh, excision lesion left thigh  . OTHER SURGICAL HISTORY Right    Surgical repair of architectual distortion Right Breast- Hyperplasia  . TONSILLECTOMY      MEDS:   Current Outpatient Medications on File Prior to Visit  Medication Sig Dispense Refill  . acyclovir ointment  (ZOVIRAX) 5 % See admin instructions. Prn cold sores  0  . aspirin 81 MG tablet Take 81 mg by mouth once a week.     . Calcium Carbonate-Vitamin D (CALCIUM + D PO) Take by mouth daily.      . Cholecalciferol (VITAMIN D) 2000 UNITS CAPS Take 1 capsule by mouth daily.    . fluticasone (FLONASE) 50 MCG/ACT nasal spray PLACE 2 SPRAYS INTO BOTH NOSTRILS AS NEEDED. 16 g 5  . hydrocortisone 2.5 % cream Apply topically 3 (three) times daily. Use as needed. 28 g 3  . IBUPROFEN PO Take 200 mg by mouth. At bedtime    . levothyroxine (SYNTHROID, LEVOTHROID) 88 MCG tablet Take 1 tablet (88 mcg total) by mouth daily before breakfast. 30 tablet 11  . LUTEIN PO Take 1 tablet by mouth daily.    . Multiple Vitamin (MULTIVITAMIN PO) Take by mouth.    . Multiple Vitamins-Minerals (PRESERVISION AREDS 2 PO) Take by mouth.    . nicotine polacrilex (NICORETTE) 2 MG gum Take 2 mg by mouth as needed. Not smoking    . NONFORMULARY OR COMPOUNDED ITEM Vaginal estradiol cream 0.02%.  1/2 gram per vaginal at bedtime two to three times weekly. 8 each 11  . valACYclovir (VALTREX) 1000 MG tablet 2 tabs (2gm) x 1, repeat in 12 hours with symptom onset 30 tablet 2   No current facility-administered medications on file prior to visit.    ALLERGIES: Codeine, Ciprofloxacin, Macrobid [nitrofurantoin], and Sulfur  Family  History  Problem Relation Age of Onset  . Hypertension Mother   . Congestive Heart Failure Mother   . Thyroid disease Mother   . Hyperlipidemia Mother   . Heart disease Mother   . Stroke Father   . Heart attack Father 63  . Diabetes Paternal Grandmother   . Diabetes Paternal Grandfather   . Hypertension Maternal Grandfather   . CVA Maternal Grandfather   . Esophageal cancer Neg Hx   . Rectal cancer Neg Hx   . Stomach cancer Neg Hx   . Colon cancer Neg Hx     SH:  Widowed, non smoker  Review of Systems  Constitutional: Negative.   HENT: Negative.   Eyes: Negative.   Respiratory: Negative.    Cardiovascular: Negative.   Gastrointestinal: Negative.   Endocrine: Negative.   Genitourinary:       Discharge  Musculoskeletal: Negative.   Skin: Negative.   Allergic/Immunologic: Negative.   Neurological: Negative.   Hematological: Negative.   Psychiatric/Behavioral: Negative.     PHYSICAL EXAMINATION:    BP 104/64   Pulse 68   Resp 16   Wt 116 lb (52.6 kg)   LMP 02/16/1988 (LMP Unknown)   BMI 21.22 kg/m     General appearance: alert, cooperative and appears stated age Lymph:  no inguinal LAD noted  Pelvic: External genitalia:  no lesions              Urethra:  normal appearing urethra with no masses, tenderness or lesions              Bartholins and Skenes: normal                 Vagina: pessary removed and cleansed, area of superficial pressure on vaginal noted with minimal mucosal breakdown but there was bleeding with removal of pessary, so left out              Cervix: no lesions              Bimanual Exam:  Uterus:  normal size, contour, position, consistency, mobility, non-tender              Adnexa: no mass, fullness, tenderness  Chaperone, Terence Lux, CMA, was present for exam.  Assessment: Incomplete uterine prolapse Cystocele Pessary use with superficial vaginal erosion  Plan: We discussed pros/cons of replacement of pessary today.  Pt aware of my job change.  She desires to have this removed.  Aware she can follow up with Dr. Quincy Simmonds.  Will wait to make her plans.  For now, she desires to leave out one month and then return for placement.   24 minutes of total time was spent for this patient encounter, including preparation, face-to-face counseling with the patient and coordination of care, and documentation of the encounter.

## 2019-12-10 ENCOUNTER — Encounter: Payer: Self-pay | Admitting: Obstetrics & Gynecology

## 2019-12-10 ENCOUNTER — Ambulatory Visit: Payer: Medicare PPO | Admitting: Obstetrics & Gynecology

## 2019-12-10 ENCOUNTER — Other Ambulatory Visit: Payer: Self-pay

## 2019-12-10 VITALS — BP 104/64 | HR 68 | Resp 16 | Wt 116.0 lb

## 2019-12-10 DIAGNOSIS — N939 Abnormal uterine and vaginal bleeding, unspecified: Secondary | ICD-10-CM | POA: Diagnosis not present

## 2019-12-10 DIAGNOSIS — N8111 Cystocele, midline: Secondary | ICD-10-CM

## 2019-12-10 DIAGNOSIS — N812 Incomplete uterovaginal prolapse: Secondary | ICD-10-CM

## 2019-12-10 DIAGNOSIS — Z4689 Encounter for fitting and adjustment of other specified devices: Secondary | ICD-10-CM

## 2019-12-11 DIAGNOSIS — H26493 Other secondary cataract, bilateral: Secondary | ICD-10-CM | POA: Diagnosis not present

## 2019-12-11 DIAGNOSIS — H26492 Other secondary cataract, left eye: Secondary | ICD-10-CM | POA: Diagnosis not present

## 2019-12-11 DIAGNOSIS — H26491 Other secondary cataract, right eye: Secondary | ICD-10-CM | POA: Diagnosis not present

## 2019-12-13 DIAGNOSIS — Z79899 Other long term (current) drug therapy: Secondary | ICD-10-CM | POA: Diagnosis not present

## 2019-12-13 DIAGNOSIS — R011 Cardiac murmur, unspecified: Secondary | ICD-10-CM | POA: Diagnosis not present

## 2019-12-13 DIAGNOSIS — K219 Gastro-esophageal reflux disease without esophagitis: Secondary | ICD-10-CM | POA: Diagnosis not present

## 2019-12-13 DIAGNOSIS — Z0000 Encounter for general adult medical examination without abnormal findings: Secondary | ICD-10-CM | POA: Diagnosis not present

## 2019-12-13 DIAGNOSIS — E039 Hypothyroidism, unspecified: Secondary | ICD-10-CM | POA: Diagnosis not present

## 2019-12-13 DIAGNOSIS — Z23 Encounter for immunization: Secondary | ICD-10-CM | POA: Diagnosis not present

## 2019-12-21 DIAGNOSIS — R011 Cardiac murmur, unspecified: Secondary | ICD-10-CM | POA: Diagnosis not present

## 2019-12-25 ENCOUNTER — Encounter: Payer: Self-pay | Admitting: Obstetrics & Gynecology

## 2020-01-14 DIAGNOSIS — Z1231 Encounter for screening mammogram for malignant neoplasm of breast: Secondary | ICD-10-CM | POA: Diagnosis not present

## 2020-01-15 DIAGNOSIS — B0089 Other herpesviral infection: Secondary | ICD-10-CM | POA: Diagnosis not present

## 2020-01-15 DIAGNOSIS — I788 Other diseases of capillaries: Secondary | ICD-10-CM | POA: Diagnosis not present

## 2020-01-15 DIAGNOSIS — L821 Other seborrheic keratosis: Secondary | ICD-10-CM | POA: Diagnosis not present

## 2020-01-15 DIAGNOSIS — D225 Melanocytic nevi of trunk: Secondary | ICD-10-CM | POA: Diagnosis not present

## 2020-01-15 DIAGNOSIS — Z85820 Personal history of malignant melanoma of skin: Secondary | ICD-10-CM | POA: Diagnosis not present

## 2020-01-15 DIAGNOSIS — L814 Other melanin hyperpigmentation: Secondary | ICD-10-CM | POA: Diagnosis not present

## 2020-01-15 DIAGNOSIS — D2262 Melanocytic nevi of left upper limb, including shoulder: Secondary | ICD-10-CM | POA: Diagnosis not present

## 2020-01-15 DIAGNOSIS — L820 Inflamed seborrheic keratosis: Secondary | ICD-10-CM | POA: Diagnosis not present

## 2020-01-15 DIAGNOSIS — D2272 Melanocytic nevi of left lower limb, including hip: Secondary | ICD-10-CM | POA: Diagnosis not present

## 2020-01-18 ENCOUNTER — Telehealth: Payer: Self-pay | Admitting: *Deleted

## 2020-01-18 NOTE — Telephone Encounter (Signed)
Left patient a message to call and schedule pessary check appointment with Dr. Sabra Heck on 01/23/2020 or later.

## 2020-01-21 ENCOUNTER — Telehealth: Payer: Self-pay | Admitting: *Deleted

## 2020-01-21 NOTE — Telephone Encounter (Signed)
Left patient a message to call and schedule appointment with Dr. Sabra Heck.

## 2020-02-28 ENCOUNTER — Ambulatory Visit: Payer: Medicare PPO | Admitting: Obstetrics & Gynecology

## 2020-02-28 ENCOUNTER — Encounter: Payer: Self-pay | Admitting: Obstetrics & Gynecology

## 2020-02-28 ENCOUNTER — Other Ambulatory Visit: Payer: Self-pay

## 2020-02-28 VITALS — BP 138/63 | HR 55 | Ht 62.0 in | Wt 113.0 lb

## 2020-02-28 DIAGNOSIS — N814 Uterovaginal prolapse, unspecified: Secondary | ICD-10-CM | POA: Diagnosis not present

## 2020-02-28 DIAGNOSIS — N8110 Cystocele, unspecified: Secondary | ICD-10-CM | POA: Diagnosis not present

## 2020-02-28 NOTE — Progress Notes (Signed)
First BP 156/69 Repeat BP 153/67 Repeat BP  138 63

## 2020-02-28 NOTE — Progress Notes (Signed)
GYNECOLOGY  VISIT  CC:   Pessary placement/incomplete uterovaginal prolapse  HPI: 80 y.o. G19P1001 [name] here for discussion of replacement of pessary.  She's had it out for about three months.  Pt is not having any bleeding or discharge.  She does feel prolapse at times but this does not bother her every day.  She is feeling some low cramping and wants to make sure this is due to the prolapse.  This occurs irregularly.  Describes this as a mild menstrual cramp type sensation.  She hasn't had any symptoms of a UTI except one day.  She took an OTC Azo once and that relieved the symptoms.  She bends over when she voids and this helps her feel like she is emptying completely.    Patient's last menstrual period was 02/16/1988 (lmp unknown).   Patient Active Problem List   Diagnosis Date Noted  . Intermittent palpitations 01/17/2017  . Right shoulder pain 12/14/2016  . Right carotid bruit 08/01/2015  . Uterine prolapse 06/13/2013  . Cystocele 06/13/2013  . Ex-cigarette smoker 05/04/2013  . Asthmatic bronchitis 05/04/2013  . 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 ESOPHAGEAL REFLUX 03/24/2010  . 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 COLONIC POLYPS 01/17/2008  . Hypothyroidism 01/17/2008    Past Medical History:  Diagnosis Date  . COPD (chronic obstructive pulmonary disease) ([place])   . History of chicken pox   . HSV-1 infection   . Hx of colonic polyps   . Hypercholesterolemia   . Hypothyroidism   . 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 MVP (mitral valve prolapse)   . Osteopenia   . Prolapsed uterus   . Raynaud disease   . Varicose vein     Past Surgical History:  Procedure Laterality Date  . 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 Patient does not remember exact date of surgery.  [name] CATARACT EXTRACTION Right 10/25/2017  . [place]  . MELANOMA EXCISION  12/31/2010   Procedure: MELANOMA EXCISION;  Surgeon: Zenovia Jarred, MD;   Location: [facility];  Service: General;  Laterality: Left;  wide excision melanoma left thigh, excision lesion left thigh  . OTHER SURGICAL HISTORY Right    Surgical repair of architectual distortion Right Breast- Hyperplasia  . TONSILLECTOMY      MEDS:   Current Outpatient Medications on File Prior to Visit  Medication Sig Dispense Refill  . acyclovir ointment (ZOVIRAX) 5 % See admin instructions. Prn cold sores  0  . aspirin 81 MG tablet Take 81 mg by mouth once a week.    . Calcium Carbonate-Vitamin D (CALCIUM + D PO) Take by mouth daily.    . Cholecalciferol (VITAMIN D) 2000 UNITS CAPS Take 1 capsule by mouth daily.    . fluticasone (FLONASE) 50 MCG/ACT nasal spray PLACE 2 SPRAYS INTO BOTH NOSTRILS AS NEEDED. 16 g 5  . hydrocortisone 2.5 % cream Apply topically 3 (three) times daily. Use as needed. 28 g 3  . IBUPROFEN PO Take 200 mg by mouth. At bedtime    . levothyroxine (SYNTHROID, LEVOTHROID) 88 MCG tablet Take 1 tablet (88 mcg total) by mouth daily before breakfast. 30 tablet 11  . LUTEIN PO Take 1 tablet by mouth daily.    . Multiple Vitamin (MULTIVITAMIN PO) Take by mouth.    . Multiple Vitamins-Minerals (PRESERVISION AREDS 2 PO) Take by mouth.    . nicotine polacrilex (NICORETTE) 2 MG gum Take  2 mg by mouth as needed. Not smoking    . NONFORMULARY OR COMPOUNDED ITEM Vaginal estradiol cream 0.02%.  1/2 gram per vaginal at bedtime two to three times weekly. 8 each 11  . valACYclovir (VALTREX) 1000 MG tablet 2 tabs (2gm) x 1, repeat in 12 hours with symptom onset 30 tablet 2   No current facility-administered medications on file prior to visit.    ALLERGIES: Codeine, Ciprofloxacin, Macrobid [nitrofurantoin], and Sulfur  Family History  Problem Relation Age of Onset  . Hypertension Mother   . Congestive Heart Failure Mother   . Thyroid disease Mother   . Hyperlipidemia Mother   . Heart disease Mother   . Stroke Father   . Heart attack Father 23  . Diabetes  Paternal Grandmother   . Diabetes Paternal Grandfather   . Hypertension Maternal Grandfather   . CVA Maternal Grandfather   . Esophageal cancer Neg Hx   . Rectal cancer Neg Hx   . Stomach cancer Neg Hx   . Colon cancer Neg Hx     SH:  Widowed, non smoker  Review of Systems  All other systems reviewed and are negative.   PHYSICAL EXAMINATION:    BP (!) 156/69   Pulse (!) 55   Ht 5\' 2"  (1.575 m)   Wt 113 lb (51.3 kg)   LMP 02/16/1988 (LMP Unknown)   BMI 20.67 kg/m     General appearance: alert, cooperative and appears stated age  Assessment/Plan: 1. Uterine prolapse - she is going to monitor her symptoms and will call if decides she needs to have pessary replaced. - she knows to call if she has any new symptoms concerning for UTI  2. Female cystocele

## 2020-04-10 ENCOUNTER — Other Ambulatory Visit: Payer: Self-pay

## 2020-04-10 ENCOUNTER — Ambulatory Visit (HOSPITAL_BASED_OUTPATIENT_CLINIC_OR_DEPARTMENT_OTHER): Payer: Medicare PPO | Admitting: Obstetrics & Gynecology

## 2020-04-10 ENCOUNTER — Encounter (HOSPITAL_BASED_OUTPATIENT_CLINIC_OR_DEPARTMENT_OTHER): Payer: Self-pay | Admitting: Obstetrics & Gynecology

## 2020-04-10 VITALS — BP 158/71 | HR 68 | Wt 116.0 lb

## 2020-04-10 DIAGNOSIS — R3911 Hesitancy of micturition: Secondary | ICD-10-CM | POA: Diagnosis not present

## 2020-04-10 DIAGNOSIS — N814 Uterovaginal prolapse, unspecified: Secondary | ICD-10-CM | POA: Diagnosis not present

## 2020-04-10 DIAGNOSIS — R309 Painful micturition, unspecified: Secondary | ICD-10-CM

## 2020-04-10 LAB — POCT URINALYSIS DIPSTICK
Appearance: NORMAL
Bilirubin, UA: NEGATIVE
Blood, UA: NEGATIVE
Glucose, UA: NEGATIVE
Ketones, UA: NEGATIVE
Leukocytes, UA: NEGATIVE
Nitrite, UA: NEGATIVE
Protein, UA: NEGATIVE
Spec Grav, UA: 1.01 (ref 1.010–1.025)
Urobilinogen, UA: 0.2 E.U./dL
pH, UA: 5 (ref 5.0–8.0)

## 2020-04-10 MED ORDER — NONFORMULARY OR COMPOUNDED ITEM: 11 refills | Status: DC

## 2020-04-10 MED ORDER — CEPHALEXIN 250 MG PO CAPS: 250.0000 mg | ORAL_CAPSULE | Freq: Four times a day (QID) | ORAL | 0 refills | Status: DC

## 2020-04-10 NOTE — Progress Notes (Signed)
GYNECOLOGY  VISIT  CC:   Urinary hesitancy  HPI: 80 y.o. G1P1001 [name] here for complaint of dysuria that started about a week ago.  This is not with every voiding episode but is definitely different.  Also, she is having increased issues with emptying her bladder.  She reports she has to sit and sit and sit before her stream will start.  These seems to worsen during the day.  She is not seeing any blood in her underwear or in her urine.  She has a hx of incomplete uterine prolapse and cystocele.  She typically uses an incontinence ring pessary.  Due to persistent granulation tissue, the pessary was removed in October.  She's ready to have the pessary replaced.    Denies fever or pelvic pain.  She is having some lower back pain.    GYNECOLOGIC HISTORY: Patient's last menstrual period was 02/16/1988 (lmp unknown). Contraception: PMP Menopausal hormone therapy: none  Patient Active Problem List   Diagnosis Date Noted  . Intermittent palpitations 01/17/2017  . Right shoulder pain 12/14/2016  . Right carotid bruit 08/01/2015  . Uterine prolapse 06/13/2013  . Female cystocele 06/13/2013  . Ex-cigarette smoker 05/04/2013  . Asthmatic bronchitis 05/04/2013  . 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 ESOPHAGEAL REFLUX 03/24/2010  . 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 COLONIC POLYPS 01/17/2008  . Hypothyroidism 01/17/2008    Past Medical History:  Diagnosis Date  . COPD [place]ch[place] obstructive [place] disease[place]   . History of chicken pox   . HSV-1 infection   . Hx of colonic polyps   . Hypercholesterolemia   . Hypothyroidism   . 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 MVP (mitral valve prolapse)   . Osteopenia   . Prolapsed uterus   . Raynaud disease   . Varicose vein     Past Surgical History:  Procedure Laterality Date  . 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 Patient does not remember exact date of surgery.  [name] CATARACT EXTRACTION  Right 10/25/2017  . [place]  . MELANOMA EXCISION  12/31/2010   Procedure: MELANOMA EXCISION;  Surgeon: Zenovia Jarred, MD;  Location: [place];  Service: General;  Laterality: Left;  wide excision melanoma left thigh, excision lesion left thigh  . OTHER SURGICAL HISTORY Right    Surgical repair of architectual distortion Right Breast- Hyperplasia  . TONSILLECTOMY      MEDS:   Current Outpatient Medications on File Prior to Visit  Medication Sig Dispense Refill  . acyclovir ointment (ZOVIRAX) 5 % See admin instructions. Prn cold sores  0  . aspirin 81 MG tablet Take 81 mg by mouth once a week.    . Calcium Carbonate-Vitamin D (CALCIUM + D PO) Take by mouth daily.    . Cholecalciferol (VITAMIN D) 2000 UNITS CAPS Take 1 capsule by mouth daily.    . fluticasone (FLONASE) 50 MCG/ACT nasal spray PLACE 2 SPRAYS INTO BOTH NOSTRILS AS NEEDED. 16 g 5  . hydrocortisone 2.5 % cream Apply topically 3 (three) times daily. Use as needed. 28 g 3  . IBUPROFEN PO Take 200 mg by mouth. At bedtime    . levothyroxine (SYNTHROID, LEVOTHROID) 88 MCG tablet Take 1 tablet (88 mcg total) by mouth daily before breakfast. 30 tablet 11  . LUTEIN PO Take 1 tablet by mouth daily.    . Multiple Vitamin (MULTIVITAMIN PO) Take by mouth.    [name]  Multiple Vitamins-Minerals (PRESERVISION AREDS 2 PO) Take by mouth.    . nicotine polacrilex (NICORETTE) 2 MG gum Take 2 mg by mouth as needed. Not smoking    . NONFORMULARY OR COMPOUNDED ITEM Vaginal estradiol cream 0.02%.  1/2 gram per vaginal at bedtime two to three times weekly. 8 each 11  . valACYclovir (VALTREX) 1000 MG tablet 2 tabs (2gm) x 1, repeat in 12 hours with symptom onset 30 tablet 2   No current facility-administered medications on file prior to visit.    ALLERGIES: Codeine, Ciprofloxacin, Elemental sulfur, and Macrobid [nitrofurantoin]  Family History  Problem Relation Age of Onset  . Hypertension Mother   . Congestive Heart  Failure Mother   . Thyroid disease Mother   . Hyperlipidemia Mother   . Heart disease Mother   . Stroke Father   . Heart attack Father 80  . Diabetes Paternal Grandmother   . Diabetes Paternal Grandfather   . Hypertension Maternal Grandfather   . CVA Maternal Grandfather   . Esophageal cancer Neg Hx   . Rectal cancer Neg Hx   . Stomach cancer Neg Hx   . Colon cancer Neg Hx     SH:  Widowed, non smoker  Review of Systems  Constitutional: Negative.   Gastrointestinal: Negative.   Genitourinary: Positive for dysuria. Negative for flank pain, vaginal bleeding and vaginal discharge.    PHYSICAL EXAMINATION:    BP (!) 158/71   Pulse 68   Wt 116 lb (52.6 kg)   LMP 02/16/1988 (LMP Unknown)   BMI 21.22 kg/m     General appearance: alert, cooperative and appears stated age Flank:  No CVA tenderness Abdomen: soft, non-tender; bowel sounds normal; no masses,  no organomegaly Lymph:  no inguinal LAD noted  Pelvic: External genitalia:  no lesions              Urethra:  normal appearing urethra with no masses, tenderness or lesions              Bartholins and Skenes: normal                 Vagina: normal appearing vagina with normal color and discharge, no lesions              Cervix: no lesions              Bimanual Exam:  Uterus:  normal size, contour, position, consistency, mobility, non-tender              Adnexa: no mass, fullness, tenderness  Chaperone, Shela Nevin, RN, was present for exam.  Assessment/Plan: 1. Pain with urination - POCT urinalysis dipstick - cephALEXin (KEFLEX) 250 MG capsule; Take 1 capsule (250 mg total) by mouth 4 (four) times daily for 5 days.  Dispense: 20 capsule; Refill: 0  2. Uterine prolapse - pessary replaced today - NONFORMULARY OR COMPOUNDED ITEM; Vaginal estradiol cream 0.02%.  1/2 gram per vagina at bedtime up to two times weekly.  Disp:  8 grams.  Dispense: 1 each; Refill: 11  3. Urinary hesitancy - Urine Culture

## 2020-04-14 LAB — URINE CULTURE

## 2020-04-17 DIAGNOSIS — I10 Essential (primary) hypertension: Secondary | ICD-10-CM | POA: Diagnosis not present

## 2020-04-17 DIAGNOSIS — J3489 Other specified disorders of nose and nasal sinuses: Secondary | ICD-10-CM | POA: Diagnosis not present

## 2020-05-07 ENCOUNTER — Encounter (HOSPITAL_BASED_OUTPATIENT_CLINIC_OR_DEPARTMENT_OTHER): Payer: Self-pay | Admitting: Obstetrics & Gynecology

## 2020-05-07 ENCOUNTER — Ambulatory Visit (HOSPITAL_BASED_OUTPATIENT_CLINIC_OR_DEPARTMENT_OTHER): Payer: Medicare PPO | Admitting: Obstetrics & Gynecology

## 2020-05-07 ENCOUNTER — Other Ambulatory Visit: Payer: Self-pay

## 2020-05-07 VITALS — BP 140/72 | HR 70 | Wt 116.0 lb

## 2020-05-07 DIAGNOSIS — Z4689 Encounter for fitting and adjustment of other specified devices: Secondary | ICD-10-CM | POA: Diagnosis not present

## 2020-05-07 DIAGNOSIS — N8110 Cystocele, unspecified: Secondary | ICD-10-CM | POA: Diagnosis not present

## 2020-05-07 DIAGNOSIS — N814 Uterovaginal prolapse, unspecified: Secondary | ICD-10-CM

## 2020-05-09 DIAGNOSIS — Z4689 Encounter for fitting and adjustment of other specified devices: Secondary | ICD-10-CM | POA: Insufficient documentation

## 2020-05-09 NOTE — Progress Notes (Signed)
80 y.o. Widowed White female G1P1001 here for pessary check.  She reports no vaginal discharge or odor.  She is not sexually active.    Patient has been using following pessary style and size:  Incontinence ring without knob, with support  Review of Systems  Gastrointestinal: Negative.   Genitourinary: Negative.   Neurological: Negative for seizures.    Exam:   BP 140/72 (BP Location: Left Arm, Patient Position: Sitting, Cuff Size: Normal)   Pulse 70   Wt 116 lb (52.6 kg)   LMP 02/16/1988 (LMP Unknown)   SpO2 100%   BMI 21.22 kg/m   General appearance: alert, cooperative and no distress Inguinal lymph nodes:  not enlarged  Pelvic: External genitalia:  no lesions              Urethra: normal appearing urethra with no masses, tenderness or lesions              Bartholins and Skenes: Bartholin's, Urethra, Skene's normal                 Vagina: normal appearing vagina with normal color and discharge, no lesions              Cervix: normal appearance   Pap obtained:  no Bimanual Exam:  Uterus:  uterus is normal size, shape, consistency and nontender                               Adnexa:    normal adnexa in size, nontender and no masses  Pessary was removed without difficulty.  Pessary was cleansed.  Pessary was replaced. Patient tolerated procedure well.    Assessment/Plan 1. Uterine prolapse - continue with pessary use.  Do not feel need to order a new one today.    2. Female cystocele  3. Pessary maintenance - recheck 4-5 weeks

## 2020-05-13 ENCOUNTER — Ambulatory Visit (HOSPITAL_BASED_OUTPATIENT_CLINIC_OR_DEPARTMENT_OTHER): Payer: Medicare PPO | Admitting: Obstetrics & Gynecology

## 2020-05-13 ENCOUNTER — Encounter (HOSPITAL_BASED_OUTPATIENT_CLINIC_OR_DEPARTMENT_OTHER): Payer: Self-pay

## 2020-05-23 ENCOUNTER — Other Ambulatory Visit: Payer: Self-pay

## 2020-05-23 ENCOUNTER — Ambulatory Visit (HOSPITAL_BASED_OUTPATIENT_CLINIC_OR_DEPARTMENT_OTHER): Payer: Medicare PPO | Admitting: Obstetrics & Gynecology

## 2020-05-23 VITALS — BP 126/70 | HR 89 | Ht 62.0 in | Wt 112.4 lb

## 2020-05-23 DIAGNOSIS — T83711A Erosion of implanted vaginal mesh and other prosthetic materials to surrounding organ or tissue, initial encounter: Secondary | ICD-10-CM | POA: Diagnosis not present

## 2020-05-23 DIAGNOSIS — N939 Abnormal uterine and vaginal bleeding, unspecified: Secondary | ICD-10-CM | POA: Diagnosis not present

## 2020-05-23 DIAGNOSIS — N898 Other specified noninflammatory disorders of vagina: Secondary | ICD-10-CM | POA: Diagnosis not present

## 2020-05-23 DIAGNOSIS — N814 Uterovaginal prolapse, unspecified: Secondary | ICD-10-CM

## 2020-05-23 MED ORDER — NONFORMULARY OR COMPOUNDED ITEM: 11 refills | Status: AC

## 2020-05-25 ENCOUNTER — Encounter (HOSPITAL_BASED_OUTPATIENT_CLINIC_OR_DEPARTMENT_OTHER): Payer: Self-pay | Admitting: Obstetrics & Gynecology

## 2020-05-25 NOTE — Progress Notes (Signed)
80 y.o. Widowed White female G1P1001 here for complaint of bright red vaginal bleeding.  Bleeding is not heavy.  Denies pelvic pain.  Denies urinary symptoms.  Does not think the estrogen from [place] is as good as from [facility].  Desires another/new prescription.    Past Medical History:  Diagnosis Date  . COPD (chronic obstructive pulmonary disease) ([place])   . History of chicken pox   . HSV-1 infection   . Hx of colonic polyps   . Hypercholesterolemia   . Hypothyroidism   . 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 MVP (mitral valve prolapse)   . Osteopenia   . Prolapsed uterus   . Raynaud disease   . Varicose vein    Current Outpatient Medications on File Prior to Visit  Medication Sig Dispense Refill  . acyclovir ointment (ZOVIRAX) 5 % See admin instructions. Prn cold sores  0  . amLODipine (NORVASC) 2.5 MG tablet Take 2.5 mg by mouth daily.    [name] aspirin 81 MG tablet Take 81 mg by mouth once a week.    . Calcium Carbonate-Vitamin D (CALCIUM + D PO) Take by mouth daily.    . Cholecalciferol (VITAMIN D) 2000 UNITS CAPS Take 1 capsule by mouth daily.    . fluticasone (FLONASE) 50 MCG/ACT nasal spray PLACE 2 SPRAYS INTO BOTH NOSTRILS AS NEEDED. 16 g 5  . hydrocortisone 2.5 % cream Apply topically 3 (three) times daily. Use as needed. 28 g 3  . IBUPROFEN PO Take 200 mg by mouth. At bedtime    . levothyroxine (SYNTHROID, LEVOTHROID) 88 MCG tablet Take 1 tablet (88 mcg total) by mouth daily before breakfast. 30 tablet 11  . LUTEIN PO Take 1 tablet by mouth daily.    . Multiple Vitamin (MULTIVITAMIN PO) Take by mouth.    . Multiple Vitamins-Minerals (PRESERVISION AREDS 2 PO) Take by mouth.    . nicotine polacrilex (NICORETTE) 2 MG gum Take 2 mg by mouth as needed. Not smoking    . valACYclovir (VALTREX) 1000 MG tablet 2 tabs (2gm) x 1, repeat in 12 hours with symptom onset 30 tablet 2   No current facility-administered medications on file prior to visit.   Allergies   Allergen Reactions  . Codeine Nausea And Vomiting  . Ciprofloxacin     Musculskeletal pain.  Santiago Bur [Nitrofurantoin]     Dizzy, headache, nausea  . Sulfa Antibiotics Nausea Only   Social History   Socioeconomic History  . Marital status: Widowed    Spouse name: fred Stebbins  . Number of children: Not on file  . Years of education: Not on file  . Highest education level: Not on file  Occupational History  . Not on file  Tobacco Use  . Smoking status: Former Smoker    Packs/day: 1.00    Years: 50.00    Pack years: 50.00    Types: Cigarettes    Start date: 03/08/1960    Quit date: 07/23/2010    Years since quitting: 9.8  . Smokeless tobacco: Never Used  . Tobacco comment: quit smoking 3 years ago  Vaping Use  . Vaping Use: Never used  Substance and Sexual Activity  . Alcohol use: No    Alcohol/week: 0.0 standard drinks  . Drug use: No  . Sexual activity: Not Currently    Partners: Male    Birth control/protection: Post-menopausal  Other Topics Concern  . Not on file  Social History Narrative  . Not on file  Social Determinants of Health   Financial Resource Strain: Not on file  Food Insecurity: Not on file  Transportation Needs: Not on file  Physical Activity: Not on file  Stress: Not on file  Social Connections: Not on file   Review of Systems  Constitutional: Negative.   Genitourinary: Negative for dysuria, frequency and urgency.  Psychiatric/Behavioral: The patient is not nervous/anxious.    Exam:   BP 126/70 (Cuff Size: Small)   Pulse 89   Ht 5\' 2"  (1.575 m)   Wt 112 lb 6.4 oz (51 kg)   LMP 02/16/1988 (LMP Unknown)   SpO2 98%   BMI 20.56 kg/m   General appearance: alert, cooperative and no distress Inguinal lymph nodes:  not enlarged  Pelvic: External genitalia:  no lesions              Urethra: normal appearing urethra with no masses, tenderness or lesions              Bartholins and Skenes: Bartholin's, Urethra, Skene's normal                  Vagina: after pessary removal, area of bleeding granulation tissue beneath the cervix noted, pessary left out              Cervix: normal appearance   Pap obtained:  no Bimanual Exam:  Uterus:  uterus is normal size, shape, consistency and nontender                               Adnexa:    normal adnexa in size, nontender and no masses                               Anus:  defer exam  1. Vaginal erosion secondary to pessary use, subsequent encounter - pessary left out.  New one will be ordered. - NONFORMULARY OR COMPOUNDED ITEM; Vaginal estradiol cream 0.02%.  1/2 gram per vagina at bedtime up to two times weekly.  Disp:  8 grams.  Dispense: 1 each; Refill: 11 - will recheck 3-4 weeks.  2. Uterine prolapse  3. Vaginal bleeding

## 2020-05-27 DIAGNOSIS — R002 Palpitations: Secondary | ICD-10-CM | POA: Diagnosis not present

## 2020-05-27 DIAGNOSIS — I10 Essential (primary) hypertension: Secondary | ICD-10-CM | POA: Diagnosis not present

## 2020-06-12 ENCOUNTER — Ambulatory Visit (HOSPITAL_BASED_OUTPATIENT_CLINIC_OR_DEPARTMENT_OTHER): Payer: Medicare PPO | Admitting: Obstetrics & Gynecology

## 2020-07-02 ENCOUNTER — Ambulatory Visit (HOSPITAL_BASED_OUTPATIENT_CLINIC_OR_DEPARTMENT_OTHER): Payer: Medicare PPO | Admitting: Obstetrics & Gynecology

## 2020-07-02 ENCOUNTER — Other Ambulatory Visit: Payer: Self-pay

## 2020-07-02 ENCOUNTER — Encounter (HOSPITAL_BASED_OUTPATIENT_CLINIC_OR_DEPARTMENT_OTHER): Payer: Self-pay | Admitting: Obstetrics & Gynecology

## 2020-07-02 VITALS — BP 136/65 | HR 72 | Wt 112.0 lb

## 2020-07-02 DIAGNOSIS — N8110 Cystocele, unspecified: Secondary | ICD-10-CM

## 2020-07-02 DIAGNOSIS — N814 Uterovaginal prolapse, unspecified: Secondary | ICD-10-CM | POA: Diagnosis not present

## 2020-07-02 DIAGNOSIS — Z4689 Encounter for fitting and adjustment of other specified devices: Secondary | ICD-10-CM | POA: Diagnosis not present

## 2020-07-02 NOTE — Progress Notes (Signed)
80 y.o. Widowed White female [id number] here for pessary placement after order new pessary for her.  She reports some low back pain that has been present with more obvious prolapse with the pessary out.  She is having trouble with emptying bladder and is trying to [facility] sure she gets as empty as possible.  Denies dysuria.  Does not think she has a UTI and does not want to leave urine sample.  She is not sexually active.   Patient has been using following pessary style and size:  Milex 2 1/2" (#3) incontinence ring with support.  Past Medical History:  Diagnosis Date  . COPD (chronic obstructive pulmonary disease) ([place])   . History of chicken pox   . HSV-1 infection   . Hx of colonic polyps   . Hypercholesterolemia   . Hypothyroidism   . 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 MVP (mitral valve prolapse)   . Osteopenia   . Prolapsed uterus   . Raynaud disease   . Varicose vein    Current Outpatient Medications on File Prior to Visit  Medication Sig Dispense Refill  . acyclovir ointment (ZOVIRAX) 5 % See admin instructions. Prn cold sores  0  . amLODipine (NORVASC) 2.5 MG tablet Take 5 mg by mouth daily.    [name] aspirin 81 MG tablet Take 81 mg by mouth once a week.    . Calcium Carbonate-Vitamin D (CALCIUM + D PO) Take by mouth daily.    . Cholecalciferol (VITAMIN D) 2000 UNITS CAPS Take 1 capsule by mouth daily.    . fluticasone (FLONASE) 50 MCG/ACT nasal spray PLACE 2 SPRAYS INTO BOTH NOSTRILS AS NEEDED. 16 g 5  . hydrocortisone 2.5 % cream Apply topically 3 (three) times daily. Use as needed. 28 g 3  . IBUPROFEN PO Take 200 mg by mouth. At bedtime    . levothyroxine (SYNTHROID, LEVOTHROID) 88 MCG tablet Take 1 tablet (88 mcg total) by mouth daily before breakfast. 30 tablet 11  . LUTEIN PO Take 1 tablet by mouth daily.    . Multiple Vitamin (MULTIVITAMIN PO) Take by mouth.    . Multiple Vitamins-Minerals (PRESERVISION AREDS 2 PO) Take by mouth.    . nicotine polacrilex (NICORETTE) 2 MG gum  Take 2 mg by mouth as needed. Not smoking    . NONFORMULARY OR COMPOUNDED ITEM Vaginal estradiol cream 0.02%.  1/2 gram per vagina at bedtime up to two times weekly.  Disp:  8 grams. 1 each 11  . valACYclovir (VALTREX) 1000 MG tablet 2 tabs (2gm) x 1, repeat in 12 hours with symptom onset 30 tablet 2   No current facility-administered medications on file prior to visit.   Allergies  Allergen Reactions  . Codeine Nausea And Vomiting  . Ciprofloxacin     Musculskeletal pain.  Santiago Bur [Nitrofurantoin]     Dizzy, headache, nausea  . Sulfa Antibiotics Nausea Only    Review of Systems  Constitutional: Negative.   Genitourinary: Negative.    Exam:   BP 136/65   Pulse 72   Wt 112 lb (50.8 kg)   LMP 02/16/1988 (LMP Unknown)   BMI 20.49 kg/m   General appearance: alert and no distress Inguinal lymph nodes:  not enlarged  Pelvic: External genitalia:  no lesions              Urethra: normal appearing urethra with no masses, tenderness or lesions              Bartholins and Skenes:  normal                 Vagina: normal appearing vagina with normal color and discharge, no lesions              Cervix: normal appearance   Pap obtained:  no Bimanual Exam:  Uterus:  uterus is normal size, shape, consistency and nontender                               Adnexa:    normal adnexa in size, nontender and no masses  Pessary was placed without difficulty. Patient tolerated procedure well.    Assessment/Plan:  1. Uterine prolapse - recheck 8 weeks  2. Female cystocele  3. Pessary maintenance

## 2020-07-28 ENCOUNTER — Ambulatory Visit (INDEPENDENT_AMBULATORY_CARE_PROVIDER_SITE_OTHER): Payer: Medicare PPO | Admitting: Obstetrics & Gynecology

## 2020-07-28 ENCOUNTER — Encounter (HOSPITAL_BASED_OUTPATIENT_CLINIC_OR_DEPARTMENT_OTHER): Payer: Self-pay | Admitting: Obstetrics & Gynecology

## 2020-07-28 ENCOUNTER — Other Ambulatory Visit: Payer: Self-pay

## 2020-07-28 VITALS — BP 153/63 | HR 62 | Wt 111.0 lb

## 2020-07-28 DIAGNOSIS — N814 Uterovaginal prolapse, unspecified: Secondary | ICD-10-CM

## 2020-07-28 DIAGNOSIS — T8389XA Other specified complication of genitourinary prosthetic devices, implants and grafts, initial encounter: Secondary | ICD-10-CM

## 2020-07-28 DIAGNOSIS — R350 Frequency of micturition: Secondary | ICD-10-CM

## 2020-07-28 DIAGNOSIS — N8110 Cystocele, unspecified: Secondary | ICD-10-CM | POA: Diagnosis not present

## 2020-07-28 DIAGNOSIS — N898 Other specified noninflammatory disorders of vagina: Secondary | ICD-10-CM | POA: Diagnosis not present

## 2020-07-28 DIAGNOSIS — Z4689 Encounter for fitting and adjustment of other specified devices: Secondary | ICD-10-CM

## 2020-07-28 DIAGNOSIS — I10 Essential (primary) hypertension: Secondary | ICD-10-CM | POA: Diagnosis not present

## 2020-07-28 DIAGNOSIS — M25552 Pain in left hip: Secondary | ICD-10-CM | POA: Diagnosis not present

## 2020-07-28 LAB — POCT URINALYSIS DIPSTICK
Appearance: ABNORMAL
Bilirubin, UA: NEGATIVE
Glucose, UA: NEGATIVE
Ketones, UA: NEGATIVE
Nitrite, UA: NEGATIVE
Protein, UA: POSITIVE — AB
Spec Grav, UA: 1.01 (ref 1.010–1.025)
Urobilinogen, UA: 0.2 E.U./dL
pH, UA: 5.5 (ref 5.0–8.0)

## 2020-07-28 MED ORDER — CEPHALEXIN 250 MG PO CAPS: 250.0000 mg | ORAL_CAPSULE | Freq: Four times a day (QID) | ORAL | 0 refills | Status: DC

## 2020-07-28 NOTE — Progress Notes (Signed)
GYNECOLOGY  VISIT  CC:   urinary sensitivity  HPI: 80 y.o. G75P1001 [name] here for complaint of urinary urgency that has been going on a few days.  Having some discomfort with urination.  Denies back pain.  Denies fever.  Had pessary placed 07/02/2020.  She reports she had vaginal bleeding on Friday.    Reports prior to her urinary symptoms, she ate with a friends at [employer].  Then she had really severe diarrhea.  She did have two Covid tests as well.  Reports she felt at times like she had the flu due to body aches.  Diarrhea has resolved.  Used Pedialyte.  Reports friend she ate with had same thing and was tested for C diff which was negative.    GYNECOLOGIC HISTORY: Patient's last menstrual period was 02/16/1988 (lmp unknown).  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 Patient does not remember exact date of surgery.   CATARACT EXTRACTION Right 10/25/2017   CESAREAN SECTION  1965   MELANOMA EXCISION  12/31/2010   Procedure: MELANOMA EXCISION;  Surgeon: Zenovia Jarred, MD;  Location: [place];  Service: General;  Laterality: Left;  wide excision melanoma left thigh, excision lesion left thigh   OTHER SURGICAL HISTORY Right    Surgical repair of architectual distortion Right Breast- Hyperplasia   TONSILLECTOMY      MEDS:   Current Outpatient Medications on File Prior to Visit  Medication Sig Dispense Refill   acyclovir ointment (ZOVIRAX) 5 % See admin instructions. Prn cold sores  0   amLODipine (NORVASC) 2.5 MG tablet Take 5 mg by mouth daily.     aspirin 81 MG tablet Take 81 mg by mouth once a week.     Calcium Carbonate-Vitamin D (CALCIUM + D PO) Take by mouth daily.     Cholecalciferol (VITAMIN D) 2000 UNITS CAPS Take 1 capsule by mouth daily.     fluticasone (FLONASE) 50 MCG/ACT nasal spray PLACE 2 SPRAYS INTO BOTH NOSTRILS AS NEEDED. 16 g 5   hydrocortisone 2.5 % cream Apply topically 3 (three) times daily. Use as needed. 28 g 3   IBUPROFEN PO Take 200 mg by mouth. At bedtime     levothyroxine (SYNTHROID, LEVOTHROID) 88 MCG tablet Take 1 tablet (88 mcg total) by mouth daily before breakfast. 30 tablet 11   LUTEIN PO Take 1 tablet by mouth daily.     Multiple Vitamin (MULTIVITAMIN PO) Take by mouth.  Multiple Vitamins-Minerals (PRESERVISION AREDS 2 PO) Take by mouth.     nicotine polacrilex (NICORETTE) 2 MG gum Take 2 mg by mouth as needed. Not smoking     NONFORMULARY OR COMPOUNDED ITEM Vaginal estradiol cream 0.02%.  1/2 gram per vagina at bedtime up to two times weekly.  Disp:  8 grams. 1 each 11   valACYclovir (VALTREX) 1000 MG tablet 2 tabs (2gm) x 1, repeat in 12 hours with symptom onset 30 tablet 2   No current facility-administered medications on file prior to visit.    ALLERGIES: Codeine, Ciprofloxacin, Macrobid [nitrofurantoin], and Sulfa antibiotics  Family History  Problem Relation Age of Onset   Hypertension Mother    Congestive Heart Failure Mother    Thyroid disease Mother    Hyperlipidemia Mother     Heart disease Mother    Stroke Father    Heart attack Father 73   Diabetes Paternal Grandmother    Diabetes Paternal Grandfather    Hypertension Maternal Grandfather    CVA Maternal Grandfather    Esophageal cancer Neg Hx    Rectal cancer Neg Hx    Stomach cancer Neg Hx    Colon cancer Neg Hx     SH:  widowed, non smoker  Review of Systems  Constitutional: Negative.   Genitourinary:  Positive for frequency and vaginal bleeding.   PHYSICAL EXAMINATION:    BP (!) 153/63   Pulse 62   Wt 111 lb (50.3 kg)   LMP 02/16/1988 (LMP Unknown)   BMI 20.30 kg/m     General appearance: alert, cooperative and appears stated age Abdomen: soft, non-tender; bowel sounds normal; no masses,  no organomegaly Lymph:  no inguinal LAD noted  Pelvic: External genitalia:  no lesions              Urethra:  normal appearing urethra with no masses, tenderness or lesions              Bartholins and Skenes: normal                 Vagina: normal appearing vagina with normal color and discharge but erosion present beneath cervix at apex (where erosions always occur with this pt)              Cervix: no lesions              Bimanual Exam:  Uterus:  normal size, contour, position, consistency, mobility, non-tender              Adnexa: no mass, fullness, tenderness              Rectovaginal: Yes.  .  Confirms.              Anus:  normal sphincter tone, no lesions  Assessment/Plan: 1. Urinary frequency - POCT Urinalysis Dipstick - Urine Culture - cephALEXin (KEFLEX) 250 MG capsule; Take 1 capsule (250 mg total) by mouth 4 (four) times daily for 5 days.  Dispense: 20 capsule; Refill: 0  2. Vaginal erosion secondary to pessary use, subsequent encounter - will leave pessary out and recheck 6-8 weeks  3. Pessary maintenance

## 2020-08-01 LAB — URINE CULTURE

## 2020-08-19 ENCOUNTER — Other Ambulatory Visit: Payer: Self-pay

## 2020-08-19 ENCOUNTER — Ambulatory Visit (HOSPITAL_BASED_OUTPATIENT_CLINIC_OR_DEPARTMENT_OTHER): Payer: Medicare PPO | Admitting: Obstetrics & Gynecology

## 2020-08-19 ENCOUNTER — Encounter (HOSPITAL_BASED_OUTPATIENT_CLINIC_OR_DEPARTMENT_OTHER): Payer: Self-pay | Admitting: Obstetrics & Gynecology

## 2020-08-19 VITALS — BP 160/78 | HR 73 | Wt 111.0 lb

## 2020-08-19 DIAGNOSIS — N8110 Cystocele, unspecified: Secondary | ICD-10-CM | POA: Diagnosis not present

## 2020-08-19 DIAGNOSIS — Z960 Presence of urogenital implants: Secondary | ICD-10-CM

## 2020-08-19 DIAGNOSIS — I10 Essential (primary) hypertension: Secondary | ICD-10-CM

## 2020-08-19 NOTE — Progress Notes (Signed)
80 y.o. Widowed White female G1P1001 here for vaginal recheck.  Pessary was removed after superficial ulcerations was present.  She denies any vaginal bleeding, discharge or odor at this point.  Is feeling like her bladder does not fully empty without the pessary.  Denies dysuria.  She is not SA.  New pessary, Milex #3 incontinence ring with support and no knob was ordered due to prior pessary feeling too large.  Here for fitting for this pessary today.    Blood pressure is elevated today.  Followed by Dr. Felipa Eth.  Medication was recently adjusted.  She does not have HA or visual changes.  She does check blood pressures at home.  It was checked twice today.  Last MMG reviewed.  Was 2020.  Pt states she did have one last year.  Will call [facility] to get copy.  Last BMD reviewed as well.  Was done in 2020 and there was some osteopenia.    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 Prn cold sores  0   amLODipine (NORVASC) 2.5 MG tablet Take 5 mg by mouth daily.     aspirin 81 MG tablet Take 81 mg by mouth once a week.     Calcium Carbonate-Vitamin D (CALCIUM + D PO) Take by mouth daily.     Cholecalciferol (VITAMIN D) 2000 UNITS CAPS Take 1 capsule by mouth daily.     fluticasone (FLONASE) 50 MCG/ACT nasal spray PLACE 2 SPRAYS INTO BOTH NOSTRILS AS NEEDED. 16 g 5   hydrocortisone 2.5 % cream Apply topically 3 (three) times daily. Use as needed. 28 g 3   IBUPROFEN PO Take 200 mg by mouth. At bedtime     levothyroxine (SYNTHROID, LEVOTHROID) 88 MCG  tablet Take 1 tablet (88 mcg total) by mouth daily before breakfast. 30 tablet 11   LUTEIN PO Take 1 tablet by mouth daily.     Multiple Vitamin (MULTIVITAMIN PO) Take by mouth.     Multiple Vitamins-Minerals (PRESERVISION AREDS 2 PO) Take by mouth.     nicotine polacrilex (NICORETTE) 2 MG gum Take 2 mg by mouth as needed. Not smoking     NONFORMULARY OR COMPOUNDED ITEM Vaginal estradiol cream 0.02%.  1/2 gram per vagina at bedtime up to two times weekly.  Disp:  8 grams. 1 each 11   valACYclovir (VALTREX) 1000 MG tablet 2 tabs (2gm) x 1, repeat in 12 hours with symptom onset 30 tablet 2   No current facility-administered medications on file prior to visit.   Social History   Socioeconomic History   Marital status: Widowed    Spouse name: fred Allensworth   Number of children: Not on file   Years of education: Not on file   Highest education level: Not on file  Occupational History   Not on file  Tobacco Use   Smoking status: Former    Packs/day: 1.00    Years: 50.00    Pack years: 50.00  Types: Cigarettes    Start date: 03/08/1960    Quit date: 07/23/2010    Years since quitting: 10.0   Smokeless tobacco: Never   Tobacco comments:    quit smoking 3 years ago  Vaping Use   Vaping Use: Never used  Substance and Sexual Activity   Alcohol use: No    Alcohol/week: 0.0 standard drinks   Drug use: No   Sexual activity: Not Currently    Partners: Male    Birth control/protection: Post-menopausal  Other Topics Concern   Not on file  Social History Narrative   Not on file   Social Determinants of Health   Financial Resource Strain: Not on file  Food Insecurity: Not on file  Transportation Needs: Not on file  Physical Activity: Not on file  Stress: Not on file  Social Connections: Not on file   Review of Systems  Constitutional: Negative.   Genitourinary:        Sensation of incomplete emptying  Psychiatric/Behavioral:  Negative for suicidal ideas.     Exam:   BP (!)  160/78   Pulse 73   Wt 111 lb (50.3 kg)   LMP 02/16/1988 (LMP Unknown)   BMI 20.30 kg/m   General appearance: alert and no distress Inguinal lymph nodes:  not enlarged  Pelvic: External genitalia:  no lesions              Urethra: not indicated              Bartholins and Skenes: normal                 Vagina: normal appearing vagina with normal color and discharge, no lesions, ulceration has fully healed,  3rd degree cystocele noted              Cervix: normal appearance   Pap obtained:  no Bimanual Exam:  Uterus:  uterus is normal size, shape, consistency and nontender                               Adnexa:    normal adnexa in size, nontender and no masses  pessary replaced with good fit.  Pt able to void easily with pessary in place.                           Assessment/Plan: 1. 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 Presence of pessary  3. Primary hypertension - followed by Dr. Felipa Eth.  She will continue to monitor and keep records at home.

## 2020-08-20 ENCOUNTER — Encounter (HOSPITAL_BASED_OUTPATIENT_CLINIC_OR_DEPARTMENT_OTHER): Payer: Self-pay | Admitting: Obstetrics & Gynecology

## 2020-08-27 ENCOUNTER — Encounter (HOSPITAL_BASED_OUTPATIENT_CLINIC_OR_DEPARTMENT_OTHER): Payer: Self-pay

## 2020-08-27 ENCOUNTER — Ambulatory Visit (HOSPITAL_BASED_OUTPATIENT_CLINIC_OR_DEPARTMENT_OTHER): Payer: Medicare PPO | Admitting: Obstetrics & Gynecology

## 2020-08-28 ENCOUNTER — Other Ambulatory Visit: Payer: Self-pay | Admitting: Obstetrics & Gynecology

## 2020-09-02 DIAGNOSIS — M545 Low back pain, unspecified: Secondary | ICD-10-CM | POA: Diagnosis not present

## 2020-09-02 DIAGNOSIS — M25552 Pain in left hip: Secondary | ICD-10-CM | POA: Diagnosis not present

## 2020-09-08 DIAGNOSIS — M5432 Sciatica, left side: Secondary | ICD-10-CM | POA: Diagnosis not present

## 2020-09-08 DIAGNOSIS — M62838 Other muscle spasm: Secondary | ICD-10-CM | POA: Diagnosis not present

## 2020-09-08 DIAGNOSIS — M4727 Other spondylosis with radiculopathy, lumbosacral region: Secondary | ICD-10-CM | POA: Diagnosis not present

## 2020-09-08 DIAGNOSIS — R262 Difficulty in walking, not elsewhere classified: Secondary | ICD-10-CM | POA: Diagnosis not present

## 2020-09-16 DIAGNOSIS — R262 Difficulty in walking, not elsewhere classified: Secondary | ICD-10-CM | POA: Diagnosis not present

## 2020-09-16 DIAGNOSIS — M5432 Sciatica, left side: Secondary | ICD-10-CM | POA: Diagnosis not present

## 2020-09-16 DIAGNOSIS — M62838 Other muscle spasm: Secondary | ICD-10-CM | POA: Diagnosis not present

## 2020-09-16 DIAGNOSIS — M4727 Other spondylosis with radiculopathy, lumbosacral region: Secondary | ICD-10-CM | POA: Diagnosis not present

## 2020-09-19 DIAGNOSIS — M5432 Sciatica, left side: Secondary | ICD-10-CM | POA: Diagnosis not present

## 2020-09-19 DIAGNOSIS — R262 Difficulty in walking, not elsewhere classified: Secondary | ICD-10-CM | POA: Diagnosis not present

## 2020-09-19 DIAGNOSIS — M4727 Other spondylosis with radiculopathy, lumbosacral region: Secondary | ICD-10-CM | POA: Diagnosis not present

## 2020-09-19 DIAGNOSIS — M62838 Other muscle spasm: Secondary | ICD-10-CM | POA: Diagnosis not present

## 2020-09-22 DIAGNOSIS — H01002 Unspecified blepharitis right lower eyelid: Secondary | ICD-10-CM | POA: Diagnosis not present

## 2020-09-23 DIAGNOSIS — M62838 Other muscle spasm: Secondary | ICD-10-CM | POA: Diagnosis not present

## 2020-09-23 DIAGNOSIS — M5432 Sciatica, left side: Secondary | ICD-10-CM | POA: Diagnosis not present

## 2020-09-23 DIAGNOSIS — R262 Difficulty in walking, not elsewhere classified: Secondary | ICD-10-CM | POA: Diagnosis not present

## 2020-09-23 DIAGNOSIS — M4727 Other spondylosis with radiculopathy, lumbosacral region: Secondary | ICD-10-CM | POA: Diagnosis not present

## 2020-09-25 DIAGNOSIS — M5432 Sciatica, left side: Secondary | ICD-10-CM | POA: Diagnosis not present

## 2020-09-25 DIAGNOSIS — M62838 Other muscle spasm: Secondary | ICD-10-CM | POA: Diagnosis not present

## 2020-09-25 DIAGNOSIS — M4727 Other spondylosis with radiculopathy, lumbosacral region: Secondary | ICD-10-CM | POA: Diagnosis not present

## 2020-09-25 DIAGNOSIS — R262 Difficulty in walking, not elsewhere classified: Secondary | ICD-10-CM | POA: Diagnosis not present

## 2020-09-26 ENCOUNTER — Ambulatory Visit (HOSPITAL_BASED_OUTPATIENT_CLINIC_OR_DEPARTMENT_OTHER): Payer: Medicare PPO | Admitting: Obstetrics & Gynecology

## 2020-09-26 ENCOUNTER — Encounter (HOSPITAL_BASED_OUTPATIENT_CLINIC_OR_DEPARTMENT_OTHER): Payer: Self-pay

## 2020-09-30 DIAGNOSIS — M62838 Other muscle spasm: Secondary | ICD-10-CM | POA: Diagnosis not present

## 2020-09-30 DIAGNOSIS — M4727 Other spondylosis with radiculopathy, lumbosacral region: Secondary | ICD-10-CM | POA: Diagnosis not present

## 2020-09-30 DIAGNOSIS — M5432 Sciatica, left side: Secondary | ICD-10-CM | POA: Diagnosis not present

## 2020-09-30 DIAGNOSIS — R262 Difficulty in walking, not elsewhere classified: Secondary | ICD-10-CM | POA: Diagnosis not present

## 2020-10-03 DIAGNOSIS — R262 Difficulty in walking, not elsewhere classified: Secondary | ICD-10-CM | POA: Diagnosis not present

## 2020-10-03 DIAGNOSIS — M4727 Other spondylosis with radiculopathy, lumbosacral region: Secondary | ICD-10-CM | POA: Diagnosis not present

## 2020-10-03 DIAGNOSIS — M5432 Sciatica, left side: Secondary | ICD-10-CM | POA: Diagnosis not present

## 2020-10-03 DIAGNOSIS — M62838 Other muscle spasm: Secondary | ICD-10-CM | POA: Diagnosis not present

## 2020-10-08 ENCOUNTER — Ambulatory Visit (HOSPITAL_BASED_OUTPATIENT_CLINIC_OR_DEPARTMENT_OTHER): Payer: Medicare PPO | Admitting: Obstetrics & Gynecology

## 2020-10-08 ENCOUNTER — Other Ambulatory Visit: Payer: Self-pay

## 2020-10-08 ENCOUNTER — Encounter (HOSPITAL_BASED_OUTPATIENT_CLINIC_OR_DEPARTMENT_OTHER): Payer: Self-pay | Admitting: Obstetrics & Gynecology

## 2020-10-08 VITALS — BP 165/78 | HR 60 | Ht 62.0 in | Wt 114.0 lb

## 2020-10-08 DIAGNOSIS — N8110 Cystocele, unspecified: Secondary | ICD-10-CM

## 2020-10-08 DIAGNOSIS — Z960 Presence of urogenital implants: Secondary | ICD-10-CM | POA: Diagnosis not present

## 2020-10-08 DIAGNOSIS — I10 Essential (primary) hypertension: Secondary | ICD-10-CM

## 2020-10-08 NOTE — Progress Notes (Signed)
80 y.o. Widowed White female G1P1001 here for pessary check.  She reports she is doing well.  Has seen discharge only once.  Denies vaginal bleeding.  No urinary symptoms today.  She is not sexually active.    Patient has been using following pessary style and size:  #3 Milex.  This is a new pessary and she is pleased with this new one.  Forgot to get vaginal estrogen cream rx filled for today.  BP elevated today.  Was rechecked with manual cuff (I did this personally) and BP still elevated.  Pt does not have headache, visual changes.  Does have cuff at home.  Drank three cups of coffee this morning.  Will monitor.  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 Prn cold sores  0   amLODipine (NORVASC) 2.5 MG tablet Take 5 mg by mouth daily.     aspirin 81 MG tablet Take 81 mg by mouth once a week.     Calcium Carbonate-Vitamin D (CALCIUM + D PO) Take by mouth daily.     Cholecalciferol (VITAMIN D) 2000 UNITS CAPS Take 1 capsule by mouth daily.     fluticasone (FLONASE) 50 MCG/ACT nasal spray PLACE 2 SPRAYS INTO BOTH NOSTRILS AS NEEDED. 16 g 5   hydrocortisone 2.5 % cream APPLY TOPICALLY 3 (THREE) TIMES DAILY. USE AS NEEDED. 28 g 3   IBUPROFEN PO Take 200 mg by mouth. At bedtime     levothyroxine (SYNTHROID, LEVOTHROID) 88 MCG tablet Take 1 tablet (88 mcg total) by mouth daily before breakfast. 30 tablet 11   LUTEIN PO Take 1 tablet by mouth daily.     Multiple Vitamin (MULTIVITAMIN PO) Take by mouth.     Multiple Vitamins-Minerals (PRESERVISION  AREDS 2 PO) Take by mouth.     nicotine polacrilex (NICORETTE) 2 MG gum Take 2 mg by mouth as needed. Not smoking     NONFORMULARY OR COMPOUNDED ITEM Vaginal estradiol cream 0.02%.  1/2 gram per vagina at bedtime up to two times weekly.  Disp:  8 grams. 1 each 11   valACYclovir (VALTREX) 1000 MG tablet 2 tabs (2gm) x 1, repeat in 12 hours with symptom onset 30 tablet 2   No current facility-administered medications on file prior to visit.    Allergies  Allergen Reactions   Codeine Nausea And Vomiting   Ciprofloxacin     Musculskeletal pain.   Macrobid [Nitrofurantoin]     Dizzy, headache, nausea   Sulfa Antibiotics Nausea Only    Review of Systems  Genitourinary: Negative.    Exam:   BP (!) 171/65 (BP Location: Left Arm, Patient Position: Sitting, Cuff Size: Small)   Pulse 60   Ht '5\' 2"'$  (1.575 m) Comment: reported  Wt 114 lb (51.7 kg)   LMP 02/16/1988 (LMP Unknown)   BMI 20.85 kg/m   General appearance: alert, cooperative, and no distress Inguinal lymph nodes:  not enlarged  Pelvic: External genitalia:  no lesions  Urethra: normal appearing urethra with no masses, tenderness or lesions              Bartholins and Skenes: Bartholin's, Urethra, Skene's normal                 Vagina: normal appearing vagina with normal color and discharge, no lesions              Cervix: normal appearance   Pap obtained:  no Bimanual Exam:  Uterus:  uterus is normal size, shape, consistency and nontender                               Adnexa:    normal adnexa in size, nontender and no masses                               Anus:  normal sphincter tone, no lesions  Pessary was removed without difficulty.  Pessary was cleansed.  Pessary was replaced. Patient tolerated procedure well.  Trimosan was placed on pessary prior to placement and tube of this given to pt.  Assessment/Plan: 1. 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 Presence of pessary  3. Elevated systolic blood pressure reading with diagnosis of hypertension - pt will monitor at home and follow up with DR. Kane if continues to be elevated.

## 2020-10-09 DIAGNOSIS — M62838 Other muscle spasm: Secondary | ICD-10-CM | POA: Diagnosis not present

## 2020-10-09 DIAGNOSIS — M4727 Other spondylosis with radiculopathy, lumbosacral region: Secondary | ICD-10-CM | POA: Diagnosis not present

## 2020-10-09 DIAGNOSIS — R262 Difficulty in walking, not elsewhere classified: Secondary | ICD-10-CM | POA: Diagnosis not present

## 2020-10-09 DIAGNOSIS — M5432 Sciatica, left side: Secondary | ICD-10-CM | POA: Diagnosis not present

## 2020-10-22 ENCOUNTER — Ambulatory Visit (HOSPITAL_BASED_OUTPATIENT_CLINIC_OR_DEPARTMENT_OTHER): Payer: Medicare PPO | Admitting: Obstetrics & Gynecology

## 2020-10-22 ENCOUNTER — Encounter (HOSPITAL_BASED_OUTPATIENT_CLINIC_OR_DEPARTMENT_OTHER): Payer: Self-pay | Admitting: Obstetrics & Gynecology

## 2020-10-22 ENCOUNTER — Other Ambulatory Visit: Payer: Self-pay

## 2020-10-22 VITALS — BP 146/60 | HR 63 | Ht 62.0 in | Wt 114.4 lb

## 2020-10-22 DIAGNOSIS — Z960 Presence of urogenital implants: Secondary | ICD-10-CM | POA: Diagnosis not present

## 2020-10-22 DIAGNOSIS — N8110 Cystocele, unspecified: Secondary | ICD-10-CM

## 2020-10-22 DIAGNOSIS — T8389XD Other specified complication of genitourinary prosthetic devices, implants and grafts, subsequent encounter: Secondary | ICD-10-CM | POA: Diagnosis not present

## 2020-10-22 DIAGNOSIS — M62838 Other muscle spasm: Secondary | ICD-10-CM | POA: Diagnosis not present

## 2020-10-22 DIAGNOSIS — N814 Uterovaginal prolapse, unspecified: Secondary | ICD-10-CM | POA: Diagnosis not present

## 2020-10-22 DIAGNOSIS — M4727 Other spondylosis with radiculopathy, lumbosacral region: Secondary | ICD-10-CM | POA: Diagnosis not present

## 2020-10-22 DIAGNOSIS — N898 Other specified noninflammatory disorders of vagina: Secondary | ICD-10-CM

## 2020-10-22 DIAGNOSIS — M5432 Sciatica, left side: Secondary | ICD-10-CM | POA: Diagnosis not present

## 2020-10-22 DIAGNOSIS — R262 Difficulty in walking, not elsewhere classified: Secondary | ICD-10-CM | POA: Diagnosis not present

## 2020-10-22 MED ORDER — ACYCLOVIR 5 % EX OINT: TOPICAL_OINTMENT | CUTANEOUS | 0 refills | Status: DC

## 2020-10-22 NOTE — Progress Notes (Addendum)
80 y.o. Widowed White female G1P1001 here for pessary check, proable removal.  She reports some vaginal discharge and bleeding that started about a week ago.  She ran out of her estrogen cream but has gotten this refilled.  She cannot remove her own pessary.  Denies urinary symptoms.  She is not sexually active.    Patient has been using following pessary style and size:  #3 incontinence.  Uses valtrex for oral HSV.  Can't get cream anymore due to cost.  Good rx costs reviewed.  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 Prn cold sores  0   amLODipine (NORVASC) 2.5 MG tablet Take 5 mg by mouth daily.     aspirin 81 MG tablet Take 81 mg by mouth once a week.     Calcium Carbonate-Vitamin D (CALCIUM + D PO) Take by mouth daily.     Cholecalciferol (VITAMIN D) 2000 UNITS CAPS Take 1 capsule by mouth daily.     fluticasone (FLONASE) 50 MCG/ACT nasal spray PLACE 2 SPRAYS INTO BOTH NOSTRILS AS NEEDED. 16 g 5   hydrocortisone 2.5 % cream APPLY TOPICALLY 3 (THREE) TIMES DAILY. USE AS NEEDED. 28 g 3   IBUPROFEN PO Take 200 mg by mouth. At bedtime     levothyroxine (SYNTHROID, LEVOTHROID) 88 MCG tablet Take 1 tablet (88 mcg total) by mouth daily before breakfast. 30 tablet 11   LUTEIN PO Take 1 tablet by mouth daily.     Multiple Vitamin (MULTIVITAMIN PO) Take by mouth.     Multiple Vitamins-Minerals (PRESERVISION AREDS 2 PO) Take by mouth.     nicotine polacrilex (NICORETTE) 2 MG gum Take 2 mg by mouth as needed. Not smoking     NONFORMULARY OR  COMPOUNDED ITEM Vaginal estradiol cream 0.02%.  1/2 gram per vagina at bedtime up to two times weekly.  Disp:  8 grams. 1 each 11   valACYclovir (VALTREX) 1000 MG tablet 2 tabs (2gm) x 1, repeat in 12 hours with symptom onset 30 tablet 2   No current facility-administered medications on file prior to visit.    Allergies  Allergen Reactions   Codeine Nausea And Vomiting   Ciprofloxacin     Musculskeletal pain.   Macrobid [Nitrofurantoin]     Dizzy, headache, nausea   Sulfa Antibiotics Nausea Only    Review of Systems  Constitutional: Negative.   Genitourinary:  Negative for dysuria and urgency.       Vaginal bleeding  Neurological:  Negative for loss of consciousness.  Psychiatric/Behavioral: Negative.     Exam:   BP (!) 146/60 (BP Location: Left Arm, Patient Position: Sitting, Cuff Size: Small)   Pulse 63   Ht '5\' 2"'$  (1.575 m) Comment: reported  Wt 114 lb 6.4 oz (51.9 kg)   LMP 02/16/1988 (LMP Unknown)   BMI 20.92 kg/m   General appearance: alert and no distress Inguinal lymph nodes:  not enlarged  Pelvic: External genitalia:  no lesions  Urethra: normal appearing urethra with no masses, tenderness or lesions              Bartholins and Skenes: Bartholin's, Urethra, Skene's normal                 Vagina: normal appearing vagina with normal color and discharge, superficial excoriation of vaginal mucosa that is located underneath cervix              Cervix: normal appearance   Pap obtained:  no Bimanual Exam:  Uterus:  uterus is normal size, shape, consistency and nontender                               Adnexa:    normal adnexa in size, nontender and no masses                               Anus:  normal sphincter tone, no lesions  Pessary was removed without difficulty.  Pessary was cleansed.  Pessary was not replaced. Will wait about 4-6 for replacement.  Patient tolerated procedure well.    Assessment/Plan: 1. 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 Uterine prolapse  3.  Vaginal erosion secondary to pessary use, subsequent encounter - pessary left out.  Will recheck 6 weeks for replacement  4. Presence of pessary -leaving out for now  5.  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

## 2020-10-22 NOTE — Addendum Note (Signed)
Addended by: Megan Salon on: 10/22/2020 08:53 AM   Modules accepted: Orders

## 2020-10-24 ENCOUNTER — Ambulatory Visit (HOSPITAL_BASED_OUTPATIENT_CLINIC_OR_DEPARTMENT_OTHER): Payer: Medicare PPO | Admitting: Obstetrics & Gynecology

## 2020-11-17 ENCOUNTER — Ambulatory Visit (HOSPITAL_BASED_OUTPATIENT_CLINIC_OR_DEPARTMENT_OTHER): Payer: Medicare PPO | Admitting: Obstetrics & Gynecology

## 2020-11-20 ENCOUNTER — Ambulatory Visit (HOSPITAL_BASED_OUTPATIENT_CLINIC_OR_DEPARTMENT_OTHER): Payer: Medicare PPO | Admitting: Obstetrics & Gynecology

## 2020-11-20 ENCOUNTER — Encounter (HOSPITAL_BASED_OUTPATIENT_CLINIC_OR_DEPARTMENT_OTHER): Payer: Self-pay | Admitting: Obstetrics & Gynecology

## 2020-11-20 ENCOUNTER — Other Ambulatory Visit: Payer: Self-pay

## 2020-11-20 VITALS — BP 114/66 | Ht 62.0 in | Wt 115.0 lb

## 2020-11-20 DIAGNOSIS — R309 Painful micturition, unspecified: Secondary | ICD-10-CM

## 2020-11-20 DIAGNOSIS — R3915 Urgency of urination: Secondary | ICD-10-CM

## 2020-11-20 LAB — POCT URINALYSIS DIPSTICK
Appearance: NORMAL
Bilirubin, UA: NEGATIVE
Blood, UA: NEGATIVE
Glucose, UA: NEGATIVE
Ketones, UA: NEGATIVE
Leukocytes, UA: NEGATIVE
Nitrite, UA: NEGATIVE
Protein, UA: NEGATIVE
Spec Grav, UA: 1.025 (ref 1.010–1.025)
Urobilinogen, UA: 0.2 E.U./dL
pH, UA: 5 (ref 5.0–8.0)

## 2020-11-20 MED ORDER — CEPHALEXIN 250 MG PO CAPS: 250.0000 mg | ORAL_CAPSULE | Freq: Four times a day (QID) | ORAL | 0 refills | Status: AC

## 2020-11-20 NOTE — Progress Notes (Signed)
80 y.o. Widowed White female G1P1001 here for pessary check.  Pessary has been out a few weeks.  Bleeding and discharge has resolved.  She is having some difficulty with urination.  Thought this might be due to the pessary being out.  No fever.  No back pain.  Has difficulty starting stream of urine.     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 Use as neede for cold sores 15 g 0   amLODipine (NORVASC) 2.5 MG tablet Take 5 mg by mouth daily.     aspirin 81 MG tablet Take 81 mg by mouth once a week.     Calcium Carbonate-Vitamin D (CALCIUM + D PO) Take by mouth daily.     Cholecalciferol (VITAMIN D) 2000 UNITS CAPS Take 1 capsule by mouth daily.     fluticasone (FLONASE) 50 MCG/ACT nasal spray PLACE 2 SPRAYS INTO BOTH NOSTRILS AS NEEDED. 16 g 5   hydrocortisone 2.5 % cream APPLY TOPICALLY 3 (THREE) TIMES DAILY. USE AS NEEDED. 28 g 3   IBUPROFEN PO Take 200 mg by mouth. At bedtime     levothyroxine (SYNTHROID, LEVOTHROID) 88 MCG tablet Take 1 tablet (88 mcg total) by mouth daily before breakfast. 30 tablet 11   LUTEIN PO Take 1 tablet by mouth daily.     Multiple Vitamin (MULTIVITAMIN PO) Take by mouth.     Multiple Vitamins-Minerals (PRESERVISION AREDS 2 PO) Take by mouth.     nicotine polacrilex (NICORETTE) 2 MG gum Take 2 mg by mouth as needed. Not smoking     NONFORMULARY OR COMPOUNDED ITEM Vaginal estradiol cream 0.02%.  1/2  gram per vagina at bedtime up to two times weekly.  Disp:  8 grams. 1 each 11   valACYclovir (VALTREX) 1000 MG tablet 2 tabs (2gm) x 1, repeat in 12 hours with symptom onset 30 tablet 2   No current facility-administered medications on file prior to visit.    Allergies  Allergen Reactions   Codeine Nausea And Vomiting   Ciprofloxacin     Musculskeletal pain.   Macrobid [Nitrofurantoin]     Dizzy, headache, nausea   Sulfa Antibiotics Nausea Only    Review of Systems  Genitourinary:  Positive for dysuria. Negative for flank pain.   Exam:   BP 114/66   Ht 5\' 2"  (1.575 m)   Wt 115 lb (52.2 kg)   LMP 02/16/1988 (LMP Unknown)   BMI 21.03 kg/m   General appearance: alert and no distress Inguinal lymph nodes:  not enlarged  Pelvic: External genitalia:  no lesions              Urethra: normal appearing urethra with no masses, tenderness or lesions              Bartholins and Skenes: Bartholin's, Urethra, Skene's normal  Vagina: normal appearing vagina with normal color and discharge, no lesions              Cervix: normal appearance   Pap obtained:  no Bimanual Exam:  Uterus:  uterus is normal size, shape, consistency and nontender                               Adnexa:    normal adnexa in size, nontender and no masses                               Anus:  normal sphincter tone, no lesions  Pessary was replaced without difficulty.  A small amount of estrogen cream was placed on the end of the pessary prior to placement. Patient tolerated procedure well.    Assessment/Plan: 1. Urinary urgency - POCT urinalysis dipstick  2. Pain with urination - cephALEXin (KEFLEX) 250 MG capsule; Take 1 capsule (250 mg total) by mouth 4 (four) times daily for 5 days.  Dispense: 20 capsule; Refill: 0 - Urine Culture  3.  Pessary use - recheck 8 weeks

## 2020-11-21 DIAGNOSIS — M5432 Sciatica, left side: Secondary | ICD-10-CM | POA: Diagnosis not present

## 2020-11-21 DIAGNOSIS — M4727 Other spondylosis with radiculopathy, lumbosacral region: Secondary | ICD-10-CM | POA: Diagnosis not present

## 2020-11-21 DIAGNOSIS — R262 Difficulty in walking, not elsewhere classified: Secondary | ICD-10-CM | POA: Diagnosis not present

## 2020-11-21 DIAGNOSIS — M62838 Other muscle spasm: Secondary | ICD-10-CM | POA: Diagnosis not present

## 2020-11-26 LAB — URINE CULTURE

## 2020-12-05 ENCOUNTER — Ambulatory Visit (HOSPITAL_BASED_OUTPATIENT_CLINIC_OR_DEPARTMENT_OTHER): Payer: Medicare PPO | Admitting: Obstetrics & Gynecology

## 2020-12-05 DIAGNOSIS — R6883 Chills (without fever): Secondary | ICD-10-CM | POA: Diagnosis not present

## 2020-12-05 DIAGNOSIS — U071 COVID-19: Secondary | ICD-10-CM | POA: Diagnosis not present

## 2020-12-05 DIAGNOSIS — R509 Fever, unspecified: Secondary | ICD-10-CM | POA: Diagnosis not present

## 2020-12-05 DIAGNOSIS — R051 Acute cough: Secondary | ICD-10-CM | POA: Diagnosis not present

## 2020-12-08 DIAGNOSIS — J029 Acute pharyngitis, unspecified: Secondary | ICD-10-CM | POA: Diagnosis not present

## 2020-12-08 DIAGNOSIS — U071 COVID-19: Secondary | ICD-10-CM | POA: Diagnosis not present

## 2020-12-19 DIAGNOSIS — K219 Gastro-esophageal reflux disease without esophagitis: Secondary | ICD-10-CM | POA: Diagnosis not present

## 2020-12-19 DIAGNOSIS — D539 Nutritional anemia, unspecified: Secondary | ICD-10-CM | POA: Diagnosis not present

## 2020-12-19 DIAGNOSIS — E039 Hypothyroidism, unspecified: Secondary | ICD-10-CM | POA: Diagnosis not present

## 2020-12-19 DIAGNOSIS — Z79899 Other long term (current) drug therapy: Secondary | ICD-10-CM | POA: Diagnosis not present

## 2020-12-19 DIAGNOSIS — Z0000 Encounter for general adult medical examination without abnormal findings: Secondary | ICD-10-CM | POA: Diagnosis not present

## 2020-12-19 DIAGNOSIS — I10 Essential (primary) hypertension: Secondary | ICD-10-CM | POA: Diagnosis not present

## 2020-12-19 DIAGNOSIS — Z1389 Encounter for screening for other disorder: Secondary | ICD-10-CM | POA: Diagnosis not present

## 2020-12-19 DIAGNOSIS — Z23 Encounter for immunization: Secondary | ICD-10-CM | POA: Diagnosis not present

## 2020-12-29 DIAGNOSIS — Z961 Presence of intraocular lens: Secondary | ICD-10-CM | POA: Diagnosis not present

## 2020-12-29 DIAGNOSIS — H40053 Ocular hypertension, bilateral: Secondary | ICD-10-CM | POA: Diagnosis not present

## 2020-12-29 DIAGNOSIS — H353132 Nonexudative age-related macular degeneration, bilateral, intermediate dry stage: Secondary | ICD-10-CM | POA: Diagnosis not present

## 2020-12-30 DIAGNOSIS — D472 Monoclonal gammopathy: Secondary | ICD-10-CM | POA: Diagnosis not present

## 2020-12-30 DIAGNOSIS — E611 Iron deficiency: Secondary | ICD-10-CM | POA: Diagnosis not present

## 2021-01-06 ENCOUNTER — Ambulatory Visit (HOSPITAL_BASED_OUTPATIENT_CLINIC_OR_DEPARTMENT_OTHER): Payer: Medicare PPO | Admitting: Obstetrics & Gynecology

## 2021-01-06 ENCOUNTER — Encounter (HOSPITAL_BASED_OUTPATIENT_CLINIC_OR_DEPARTMENT_OTHER): Payer: Self-pay | Admitting: Obstetrics & Gynecology

## 2021-01-06 ENCOUNTER — Other Ambulatory Visit: Payer: Self-pay

## 2021-01-06 VITALS — BP 158/82 | HR 88 | Resp 16

## 2021-01-06 DIAGNOSIS — N898 Other specified noninflammatory disorders of vagina: Secondary | ICD-10-CM | POA: Diagnosis not present

## 2021-01-06 DIAGNOSIS — N814 Uterovaginal prolapse, unspecified: Secondary | ICD-10-CM | POA: Diagnosis not present

## 2021-01-06 DIAGNOSIS — T8389XD Other specified complication of genitourinary prosthetic devices, implants and grafts, subsequent encounter: Secondary | ICD-10-CM

## 2021-01-06 DIAGNOSIS — T8389XA Other specified complication of genitourinary prosthetic devices, implants and grafts, initial encounter: Secondary | ICD-10-CM | POA: Diagnosis not present

## 2021-01-06 DIAGNOSIS — N8110 Cystocele, unspecified: Secondary | ICD-10-CM | POA: Diagnosis not present

## 2021-01-06 NOTE — Progress Notes (Signed)
80 y.o. Widowed White female G1P1001 here for pessary check.  She reports some brownish/reddish vaginal discharge.  Feels the pessary probably needs to be removed.  Denies pelvic pain or urinary symptoms.  She is not SA.   Reports she had Covid in late October and was really sick.  Saw Dr. Felipa Eth and had blood work done.  Protein was elevated.  Electrophoresis showed elevated lamba chains.  She brought this for me to review and ask questions.  She knows this is not my expertise area.  She is going to have repeat blood work done and then referral to cancer center has been made.  We discussed concerns about multiple myeloma with these results.   Patient has been using following pessary style and size:  #3 incontinence ring with support.  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 Use as neede for cold sores 15 g 0   amLODipine (NORVASC) 2.5 MG tablet Take 5 mg by mouth daily.     aspirin 81 MG tablet Take 81 mg by mouth once a week.     Calcium Carbonate-Vitamin D (CALCIUM + D PO) Take by mouth daily.     Cholecalciferol (VITAMIN D) 2000 UNITS CAPS Take 1 capsule by mouth daily.     fluticasone (FLONASE) 50 MCG/ACT nasal spray PLACE 2 SPRAYS INTO BOTH NOSTRILS AS NEEDED. 16 g 5   hydrocortisone 2.5 % cream APPLY TOPICALLY 3 (THREE) TIMES DAILY. USE AS NEEDED. 28 g 3   IBUPROFEN PO Take 200 mg by mouth. At bedtime     levothyroxine (SYNTHROID, LEVOTHROID) 88 MCG tablet Take 1 tablet (88 mcg total) by mouth daily before breakfast. 30  tablet 11   LUTEIN PO Take 1 tablet by mouth daily.     Multiple Vitamin (MULTIVITAMIN PO) Take by mouth.     Multiple Vitamins-Minerals (PRESERVISION AREDS 2 PO) Take by mouth.     nicotine polacrilex (NICORETTE) 2 MG gum Take 2 mg by mouth as needed. Not smoking     NONFORMULARY OR COMPOUNDED ITEM Vaginal estradiol cream 0.02%.  1/2 gram per vagina at bedtime up to two times weekly.  Disp:  8 grams. 1 each 11   valACYclovir (VALTREX) 1000 MG tablet 2 tabs (2gm) x 1, repeat in 12 hours with symptom onset 30 tablet 2   No current facility-administered medications on file prior to visit.    Allergies  Allergen Reactions   Codeine Nausea And Vomiting   Ciprofloxacin     Musculskeletal pain.   Macrobid [Nitrofurantoin]     Dizzy, headache, nausea   Sulfa Antibiotics Nausea Only    Review of Systems  Genitourinary:        Vaginal dicharge   Exam:   BP (!) 158/82   Pulse 88   Resp 16   LMP 02/16/1988 (LMP Unknown)   General appearance: alert and no distress Inguinal lymph nodes:  not enlarged  Pelvic: External genitalia:  no lesions              Urethra: normal appearing urethra with no masses, tenderness or lesions              Bartholins and Skenes: Bartholin's, Urethra, Skene's normal                 Vagina: superficial vaginal mucosa erosion beneath cervix in sam location this occurs, no other lesions noted              Cervix: normal appearance   Pap obtained:  no Bimanual Exam:  Uterus:  uterus is normal size, shape, consistency and nontender                               Adnexa:    normal adnexa in size, nontender and no masses  Pessary was removed without difficulty.  Pessary was cleansed.  Pessary was not replaced. Pessary given to pt.  Patient tolerated procedure well.    Assessment/Plan: 1. 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 Uterine prolapse  3. Vaginal erosion secondary to pessary use, subsequent encounter - will recheck in 4 weeks and if area has healed, will replace  pessary  4.  Elevated serum protein - pt aware I agree with plan to recheck but I think she should have hematology/oncology appointment even if this is back to normal with recheck.  Voices understanding.

## 2021-01-16 ENCOUNTER — Ambulatory Visit (HOSPITAL_BASED_OUTPATIENT_CLINIC_OR_DEPARTMENT_OTHER): Payer: Medicare PPO | Admitting: Obstetrics & Gynecology

## 2021-01-19 DIAGNOSIS — Z1231 Encounter for screening mammogram for malignant neoplasm of breast: Secondary | ICD-10-CM | POA: Diagnosis not present

## 2021-01-20 DIAGNOSIS — D225 Melanocytic nevi of trunk: Secondary | ICD-10-CM | POA: Diagnosis not present

## 2021-01-20 DIAGNOSIS — D2262 Melanocytic nevi of left upper limb, including shoulder: Secondary | ICD-10-CM | POA: Diagnosis not present

## 2021-01-20 DIAGNOSIS — Z85820 Personal history of malignant melanoma of skin: Secondary | ICD-10-CM | POA: Diagnosis not present

## 2021-01-20 DIAGNOSIS — L111 Transient acantholytic dermatosis [Grover]: Secondary | ICD-10-CM | POA: Diagnosis not present

## 2021-01-20 DIAGNOSIS — L821 Other seborrheic keratosis: Secondary | ICD-10-CM | POA: Diagnosis not present

## 2021-01-20 DIAGNOSIS — D692 Other nonthrombocytopenic purpura: Secondary | ICD-10-CM | POA: Diagnosis not present

## 2021-01-20 DIAGNOSIS — L814 Other melanin hyperpigmentation: Secondary | ICD-10-CM | POA: Diagnosis not present

## 2021-01-20 DIAGNOSIS — D2272 Melanocytic nevi of left lower limb, including hip: Secondary | ICD-10-CM | POA: Diagnosis not present

## 2021-01-21 ENCOUNTER — Encounter (HOSPITAL_BASED_OUTPATIENT_CLINIC_OR_DEPARTMENT_OTHER): Payer: Self-pay | Admitting: *Deleted

## 2021-02-03 ENCOUNTER — Ambulatory Visit (HOSPITAL_BASED_OUTPATIENT_CLINIC_OR_DEPARTMENT_OTHER): Payer: Medicare PPO | Admitting: Obstetrics & Gynecology

## 2021-02-03 ENCOUNTER — Encounter (HOSPITAL_BASED_OUTPATIENT_CLINIC_OR_DEPARTMENT_OTHER): Payer: Self-pay

## 2021-02-18 ENCOUNTER — Inpatient Hospital Stay: Payer: Medicare PPO | Admitting: Hematology & Oncology

## 2021-02-18 ENCOUNTER — Inpatient Hospital Stay: Payer: Medicare PPO | Attending: Geriatric Medicine

## 2021-02-18 ENCOUNTER — Encounter: Payer: Self-pay | Admitting: Hematology & Oncology

## 2021-02-18 ENCOUNTER — Ambulatory Visit (HOSPITAL_BASED_OUTPATIENT_CLINIC_OR_DEPARTMENT_OTHER)
Admission: RE | Admit: 2021-02-18 | Discharge: 2021-02-18 | Disposition: A | Discharge status: Home / Self Care | Payer: Medicare PPO | Source: Ambulatory Visit | Attending: Hematology & Oncology | Admitting: Hematology & Oncology

## 2021-02-18 ENCOUNTER — Other Ambulatory Visit: Payer: Self-pay | Admitting: *Deleted

## 2021-02-18 ENCOUNTER — Other Ambulatory Visit: Payer: Self-pay

## 2021-02-18 VITALS — BP 160/64 | HR 77 | Temp 97.8°F | Resp 18 | Ht 62.0 in | Wt 112.8 lb

## 2021-02-18 DIAGNOSIS — Z87891 Personal history of nicotine dependence: Secondary | ICD-10-CM | POA: Diagnosis not present

## 2021-02-18 DIAGNOSIS — Z85820 Personal history of malignant melanoma of skin: Secondary | ICD-10-CM | POA: Diagnosis not present

## 2021-02-18 DIAGNOSIS — R768 Other specified abnormal immunological findings in serum: Secondary | ICD-10-CM

## 2021-02-18 DIAGNOSIS — E611 Iron deficiency: Secondary | ICD-10-CM | POA: Diagnosis not present

## 2021-02-18 DIAGNOSIS — D472 Monoclonal gammopathy: Secondary | ICD-10-CM

## 2021-02-18 DIAGNOSIS — D509 Iron deficiency anemia, unspecified: Secondary | ICD-10-CM

## 2021-02-18 DIAGNOSIS — C4370 Malignant melanoma of unspecified lower limb, including hip: Secondary | ICD-10-CM

## 2021-02-18 DIAGNOSIS — C9000 Multiple myeloma not having achieved remission: Secondary | ICD-10-CM | POA: Diagnosis not present

## 2021-02-18 LAB — CMP (CANCER CENTER ONLY)
ALT: 14 U/L (ref 0–44)
AST: 20 U/L (ref 15–41)
Albumin: 4 g/dL (ref 3.5–5.0)
Alkaline Phosphatase: 64 U/L (ref 38–126)
Anion gap: 10 (ref 5–15)
BUN: 15 mg/dL (ref 8–23)
CO2: 28 mmol/L (ref 22–32)
Calcium: 9.9 mg/dL (ref 8.9–10.3)
Chloride: 100 mmol/L (ref 98–111)
Creatinine: 0.75 mg/dL (ref 0.44–1.00)
GFR, Estimated: >60 mL/min (ref >60)
Glucose, Bld: 90 mg/dL (ref 70–99)
Potassium: 3.7 mmol/L (ref 3.5–5.1)
Sodium: 138 mmol/L (ref 135–145)
Total Bilirubin: 0.3 mg/dL (ref 0.3–1.2)
Total Protein: 7.2 g/dL (ref 6.5–8.1)

## 2021-02-18 LAB — CBC WITH DIFFERENTIAL (CANCER CENTER ONLY)
Abs Immature Granulocytes: 0.01 10*3/uL (ref 0.00–0.07)
Basophils Absolute: 0 10*3/uL (ref 0.0–0.1)
Basophils Relative: 0 %
Eosinophils Absolute: 0.1 10*3/uL (ref 0.0–0.5)
Eosinophils Relative: 1 %
HCT: 28.9 % — ABNORMAL LOW (ref 36.0–46.0)
Hemoglobin: 9.2 g/dL — ABNORMAL LOW (ref 12.0–15.0)
Immature Granulocytes: 0 %
Lymphocytes Relative: 55 %
Lymphs Abs: 4.2 10*3/uL — ABNORMAL HIGH (ref 0.7–4.0)
MCH: 31 pg (ref 26.0–34.0)
MCHC: 31.8 g/dL (ref 30.0–36.0)
MCV: 97.3 fL (ref 80.0–100.0)
Monocytes Absolute: 0.8 10*3/uL (ref 0.1–1.0)
Monocytes Relative: 11 %
Neutro Abs: 2.5 10*3/uL (ref 1.7–7.7)
Neutrophils Relative %: 33 %
Platelet Count: 201 10*3/uL (ref 150–400)
RBC: 2.97 MIL/uL — ABNORMAL LOW (ref 3.87–5.11)
RDW: 21.6 % — ABNORMAL HIGH (ref 11.5–15.5)
WBC Count: 7.6 10*3/uL (ref 4.0–10.5)
nRBC: 0.3 % — ABNORMAL HIGH (ref 0.0–0.2)

## 2021-02-18 LAB — LACTATE DEHYDROGENASE: LDH: 131 U/L (ref 98–192)

## 2021-02-18 NOTE — Progress Notes (Signed)
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  HPI: Ariel Holmes is actually known to me.  I have seen her in the past.  Last I saw her back in 2017.  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  She had this resected.  Have not seen her since 2017.  She has been doing well.  She is followed by Dr. Felipa Eth who, as we all know, is incredibly thorough with his evaluations.  Back in November, he did some lab work on her.  She was found to be iron deficient.  Her iron saturation was only 12%.  I do not see a ferritin on her.  For some reason, he did an SPEP on her.  This actually showed an M spike of 1.2 g/dL.  She was mildly anemic.  Her white cell count was 6.6.  Hemoglobin 11.3.  Platelet count 200,000.  MCV was 95.  She had a BUN of 13 creatinine 0.65.  Her total protein was 6.9 with an albumin of 4.3.  He followed up with a kappa light chain evaluation.x Surprisingly showed a markedly elevated serum kappa light chain of 422 mg/L.  Based on this, he kindly referred her to the [facility] for an evaluation.  She had immunofixation done.  This showed an IgA kappa monoclonal protein.  Her IgA level was 2113 mg/dL.  She had a low IgG and IgM level.  Based on this, she was, referred to the [facility].  She is having no bony pain.  She had no problem with infections.  She has had no fever.  There is no weight loss or weight gain.  She has had no cough or shortness of breath..  She apparently did have COVID I think back in the fall.  She does not smoke.  She does not drink.  Currently, I would say performance status is probably ECOG 1.    Past Medical History:  Diagnosis Date   COPD (chronic obstructive pulmonary disease) ([place])    History of chicken pox    HSV-1 infection    Hx of colonic polyps     Hypercholesterolemia    Hypothyroidism    Melanoma of thigh ([place]) 02/15/2011   MVP (mitral valve prolapse)    Osteopenia    Prolapsed uterus    Raynaud disease    Varicose vein   :   Past Surgical History:  Procedure Laterality Date   BREAST SURGERY     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 Patient does not remember exact date of surgery.   CATARACT EXTRACTION Right 10/25/2017   CESAREAN SECTION  1965   MELANOMA EXCISION  12/31/2010   Procedure: MELANOMA EXCISION;  Surgeon: Zenovia Jarred, MD;  Location: [facility];  Service: General;  Laterality: Left;  wide excision melanoma left thigh, excision lesion left thigh   OTHER SURGICAL HISTORY Right    Surgical repair of architectual distortion Right Breast- Hyperplasia   TONSILLECTOMY    :   Current Outpatient Medications:    acyclovir ointment (ZOVIRAX) 5 %, Apply topically every 3 (three) hours. Use as neede for cold sores, Disp: 15 g, Rfl: 0   amLODipine (NORVASC) 5 MG tablet, Take 1 tablet by mouth daily., Disp: , Rfl:    aspirin 81 MG EC tablet,  See admin instructions., Disp: , Rfl:    Calcium Carbonate-Vitamin D (CALCIUM + D PO), Take by mouth daily., Disp: , Rfl:    Cholecalciferol (VITAMIN D) 2000 UNITS CAPS, Take 1 capsule by mouth daily., Disp: , Rfl:    fluticasone (FLONASE) 50 MCG/ACT nasal spray, PLACE 2 SPRAYS INTO BOTH NOSTRILS AS NEEDED., Disp: 16 g, Rfl: 5   hydrocortisone 2.5 % cream, APPLY TOPICALLY 3 (THREE) TIMES DAILY. USE AS NEEDED., Disp: 28 g, Rfl: 3   IBUPROFEN PO, Take 200 mg by mouth. At bedtime, Disp: , Rfl:    levothyroxine (SYNTHROID, LEVOTHROID) 88 MCG tablet, Take 1 tablet (88 mcg total) by mouth daily before breakfast., Disp: 30 tablet, Rfl: 11   LUTEIN PO, Take 1 tablet by mouth daily., Disp: , Rfl:    Multiple Vitamin (MULTIVITAMIN PO), Take by mouth., Disp: , Rfl:    Multiple Vitamins-Minerals (PRESERVISION AREDS 2 PO), Take by mouth., Disp: , Rfl:    nicotine polacrilex  (NICORETTE) 2 MG gum, Take 2 mg by mouth as needed. Not smoking, Disp: , Rfl:    NONFORMULARY OR COMPOUNDED ITEM, Vaginal estradiol cream 0.02%.  1/2 gram per vagina at bedtime up to two times weekly.  Disp:  8 grams., Disp: 1 each, Rfl: 11   valACYclovir (VALTREX) 1000 MG tablet, 2 tabs (2gm) x 1, repeat in 12 hours with symptom onset, Disp: 30 tablet, Rfl: 2:  :   Allergies  Allergen Reactions   Ciprofloxacin Other (See Comments)    Musculskeletal pain.   Codeine Nausea And Vomiting   Macrobid [Nitrofurantoin] Nausea Only and Other (See Comments)    Dizzy, headache,   Sulfa Antibiotics Nausea Only  :   Family History  Problem Relation Age of Onset   Hypertension Mother    Congestive Heart Failure Mother    Thyroid disease Mother    Hyperlipidemia Mother    Heart disease Mother    Stroke Father    Heart attack Father 52   Diabetes Paternal Grandmother    Diabetes Paternal Grandfather    Hypertension Maternal Grandfather    CVA Maternal Grandfather    Esophageal cancer Neg Hx    Rectal cancer Neg Hx    Stomach cancer Neg Hx    Colon cancer Neg Hx   :   Social History   Socioeconomic History   Marital status: Widowed    Spouse name: fred Shoff   Number of children: Not on file   Years of education: Not on file   Highest education level: Not on file  Occupational History   Not on file  Tobacco Use   Smoking status: Former    Packs/day: 1.00    Years: 50.00    Pack years: 50.00    Types: Cigarettes    Start date: 03/08/1960    Quit date: 07/23/2010    Years since quitting: 10.5   Smokeless tobacco: Never  Vaping Use   Vaping Use: Never used  Substance and Sexual Activity   Alcohol use: No    Alcohol/week: 0.0 standard drinks   Drug use: No   Sexual activity: Not Currently    Partners: Male    Birth control/protection: Post-menopausal  Other Topics Concern   Not on file  Social History Narrative   Not on file   Social Determinants of Health    Financial Resource Strain: Not on file  Food Insecurity: Not on file  Transportation Needs: Not on file  Physical Activity: Not on file  Stress:  Not on file  Social Connections: Not on file  Intimate Partner Violence: Not on file  :  Review of Systems  Constitutional: Negative.   HENT: Negative.    Eyes: Negative.   Respiratory: Negative.    Cardiovascular: Negative.   Gastrointestinal: Negative.   Genitourinary: Negative.   Musculoskeletal: Negative.   Skin: Negative.   Neurological: Negative.   Endo/Heme/Allergies: Negative.   Psychiatric/Behavioral: Negative.      Exam: @IPVITALS @ Physical Exam Vitals reviewed.  HENT:     Head: Normocephalic and atraumatic.  Eyes:     Pupils: Pupils are equal, round, and reactive to light.  Cardiovascular:     Rate and Rhythm: Normal rate and regular rhythm.     Heart sounds: Normal heart sounds.  Pulmonary:     Effort: Pulmonary effort is normal.     Breath sounds: Normal breath sounds.  Abdominal:     General: Bowel sounds are normal.     Palpations: Abdomen is soft.  Musculoskeletal:        General: No tenderness or deformity. Normal range of motion.     Cervical back: Normal range of motion.  Lymphadenopathy:     Cervical: No cervical adenopathy.  Skin:    General: Skin is warm and dry.     Findings: No erythema or rash.  Neurological:     Mental Status: She is alert and oriented to person, place, and time.  Psychiatric:        Behavior: Behavior normal.        Thought Content: Thought content normal.        Judgment: Judgment normal.    Recent Labs    02/18/21 1337  WBC 7.6  HGB 9.2*  HCT 28.9*  PLT 201    Recent Labs    02/18/21 1337  NA 138  K 3.7  CL 100  CO2 28  GLUCOSE 90  BUN 15  CREATININE 0.75  CALCIUM 9.9    Blood smear review: None  Pathology: None    Assessment and Plan: Ariel Holmes is a very charming 81 year old white female.  I suspect that she probably will have myeloma.  I  suspect that this will be the IgA kappa myeloma.  We are getting a bone survey on her today.  We will see what that shows.  She is going up give is a 24-hour urine.  This may give [place] a better idea as to how much light chain she is producing.  I really do not think we have to do a bone marrow test on her.  I am unsure what is this really is going to add to the work-up.  I know that we do not have any of the cytogenetics or chromosome studies.  However, I think that this would not be all that important at this point.  We will see what the bone survey shows.  We will see what the 24-hour urine shows.  I  know that she also has the iron deficiency.  I am not sure why she would be iron deficient.  We will see what her iron levels show.  Again, I suspect that she probably does have myeloma.  She does have a significant elevation of her IgA heavy chain and decrease in her IgG and IgM heavy chains.  We will get her back into the office once we have the results of her other studies back and then we will decide how we can proceed.

## 2021-02-18 NOTE — Progress Notes (Signed)
24

## 2021-02-19 ENCOUNTER — Encounter: Payer: Self-pay | Admitting: *Deleted

## 2021-02-19 ENCOUNTER — Telehealth: Payer: Self-pay | Admitting: *Deleted

## 2021-02-19 LAB — IRON AND IRON BINDING CAPACITY (CC-WL,HP ONLY)
Iron: 49 ug/dL (ref 28–170)
Saturation Ratios: 17 % (ref 10.4–31.8)
TIBC: 284 ug/dL (ref 250–450)
UIBC: 235 ug/dL (ref 148–442)

## 2021-02-19 LAB — IGG, IGA, IGM
IgA: 1979 mg/dL — ABNORMAL HIGH (ref 64–422)
IgG (Immunoglobin G), Serum: 256 mg/dL — ABNORMAL LOW (ref 586–1602)
IgM (Immunoglobulin M), Srm: 12 mg/dL — ABNORMAL LOW (ref 26–217)

## 2021-02-19 LAB — FERRITIN: Ferritin: 206 ng/mL (ref 11–307)

## 2021-02-19 LAB — KAPPA/LAMBDA LIGHT CHAINS
Kappa free light chain: 467.6 mg/L — ABNORMAL HIGH (ref 3.3–19.4)
Kappa, lambda light chain ratio: 63.19 — ABNORMAL HIGH (ref 0.26–1.65)
Lambda free light chains: 7.4 mg/L (ref 5.7–26.3)

## 2021-02-19 NOTE — Telephone Encounter (Signed)
Patient notified that "the bone survey does not show any myeloma lesions!!!!  Ariel Holmes"  Pt appreciative of call and has no questions at this time.

## 2021-02-19 NOTE — Telephone Encounter (Signed)
-----   Message from [name], MD sent at 02/19/2021  6:58 AM EST ----- Call - the bone survey does not show any myeloma lesions!!!!  Ariel Holmes

## 2021-02-20 ENCOUNTER — Encounter (HOSPITAL_BASED_OUTPATIENT_CLINIC_OR_DEPARTMENT_OTHER): Payer: Self-pay | Admitting: Obstetrics & Gynecology

## 2021-02-20 ENCOUNTER — Ambulatory Visit (HOSPITAL_BASED_OUTPATIENT_CLINIC_OR_DEPARTMENT_OTHER): Payer: Medicare PPO | Admitting: Obstetrics & Gynecology

## 2021-02-20 ENCOUNTER — Other Ambulatory Visit: Payer: Self-pay

## 2021-02-20 VITALS — BP 162/69 | HR 66 | Ht 62.0 in | Wt 110.6 lb

## 2021-02-20 DIAGNOSIS — N8110 Cystocele, unspecified: Secondary | ICD-10-CM | POA: Diagnosis not present

## 2021-02-20 DIAGNOSIS — N814 Uterovaginal prolapse, unspecified: Secondary | ICD-10-CM

## 2021-02-20 DIAGNOSIS — R350 Frequency of micturition: Secondary | ICD-10-CM | POA: Diagnosis not present

## 2021-02-20 DIAGNOSIS — Z4689 Encounter for fitting and adjustment of other specified devices: Secondary | ICD-10-CM

## 2021-02-20 DIAGNOSIS — R3915 Urgency of urination: Secondary | ICD-10-CM | POA: Diagnosis not present

## 2021-02-20 DIAGNOSIS — M545 Low back pain, unspecified: Secondary | ICD-10-CM

## 2021-02-20 LAB — POCT URINALYSIS DIPSTICK
Bilirubin, UA: NEGATIVE
Blood, UA: NEGATIVE
Glucose, UA: NEGATIVE
Ketones, UA: NEGATIVE
Leukocytes, UA: NEGATIVE
Nitrite, UA: NEGATIVE
Protein, UA: NEGATIVE
Spec Grav, UA: <=1.005 — AB (ref 1.010–1.025)
Urobilinogen, UA: 0.2 E.U./dL
pH, UA: 5.5 (ref 5.0–8.0)

## 2021-02-20 MED ORDER — CEPHALEXIN 250 MG PO CAPS: 250.0000 mg | ORAL_CAPSULE | Freq: Four times a day (QID) | ORAL | 0 refills | Status: AC

## 2021-02-23 ENCOUNTER — Ambulatory Visit (HOSPITAL_BASED_OUTPATIENT_CLINIC_OR_DEPARTMENT_OTHER): Payer: Medicare PPO | Admitting: Obstetrics & Gynecology

## 2021-02-23 ENCOUNTER — Inpatient Hospital Stay: Payer: Medicare PPO

## 2021-02-23 DIAGNOSIS — R768 Other specified abnormal immunological findings in serum: Secondary | ICD-10-CM | POA: Diagnosis not present

## 2021-02-23 DIAGNOSIS — E611 Iron deficiency: Secondary | ICD-10-CM | POA: Diagnosis not present

## 2021-02-23 DIAGNOSIS — D472 Monoclonal gammopathy: Secondary | ICD-10-CM

## 2021-02-23 DIAGNOSIS — Z87891 Personal history of nicotine dependence: Secondary | ICD-10-CM | POA: Diagnosis not present

## 2021-02-23 DIAGNOSIS — Z85820 Personal history of malignant melanoma of skin: Secondary | ICD-10-CM | POA: Diagnosis not present

## 2021-02-23 LAB — IMMUNOFIXATION REFLEX, SERUM
IgA: 2256 mg/dL — ABNORMAL HIGH (ref 64–422)
IgG (Immunoglobin G), Serum: 288 mg/dL — ABNORMAL LOW (ref 586–1602)
IgM (Immunoglobulin M), Srm: 12 mg/dL — ABNORMAL LOW (ref 26–217)

## 2021-02-23 LAB — PROTEIN ELECTROPHORESIS, SERUM, WITH REFLEX
A/G Ratio: 1.2 (ref 0.7–1.7)
Albumin ELP: 4 g/dL (ref 2.9–4.4)
Alpha-1-Globulin: 0.2 g/dL (ref 0.0–0.4)
Alpha-2-Globulin: 0.7 g/dL (ref 0.4–1.0)
Beta Globulin: 2.1 g/dL — ABNORMAL HIGH (ref 0.7–1.3)
Gamma Globulin: 0.3 g/dL — ABNORMAL LOW (ref 0.4–1.8)
Globulin, Total: 3.4 g/dL (ref 2.2–3.9)
M-Spike, %: 1.2 g/dL — ABNORMAL HIGH
SPEP Interpretation: 0
Total Protein ELP: 7.4 g/dL (ref 6.0–8.5)

## 2021-02-23 LAB — URINE CULTURE

## 2021-02-23 NOTE — Progress Notes (Signed)
81 y.o. Widowed White female G1P1001 here for pessary replacement.  Reports she is having some dysuria as well as low back pain/discomfort.  She's had similar symptoms when pessary has been out for several weeks.  Denies fever.  Has nto seen any blood in her urine.  Has seen some cloudiness in her urine.  Denies vaginal bleeding.  She is not sexually active.    Past Medical History:  Diagnosis Date   COPD (chronic obstructive pulmonary disease) ([place])    History of chicken pox    HSV-1 infection    Hx of colonic polyps    Hypercholesterolemia    Hypothyroidism    Melanoma of thigh ([place]) 02/15/2011   MVP (mitral valve prolapse)    Osteopenia    Prolapsed uterus    Raynaud disease    Varicose vein     Current Outpatient Medications on File Prior to Visit  Medication Sig Dispense Refill   acyclovir ointment (ZOVIRAX) 5 % Apply topically every 3 (three) hours. Use as neede for cold sores 15 g 0   amLODipine (NORVASC) 5 MG tablet Take 1 tablet by mouth daily.     aspirin 81 MG EC tablet See admin instructions.     Calcium Carbonate-Vitamin D (CALCIUM + D PO) Take by mouth daily.     Cholecalciferol (VITAMIN D) 2000 UNITS CAPS Take 1 capsule by mouth daily.     fluticasone (FLONASE) 50 MCG/ACT nasal spray PLACE 2 SPRAYS INTO BOTH NOSTRILS AS NEEDED. 16 g 5   hydrocortisone 2.5 % cream APPLY TOPICALLY 3 (THREE) TIMES DAILY. USE AS NEEDED. 28 g 3   IBUPROFEN PO Take 200 mg by mouth. At bedtime     levothyroxine (SYNTHROID, LEVOTHROID) 88 MCG tablet Take 1 tablet (88 mcg total) by mouth daily before breakfast. 30 tablet 11   LUTEIN PO Take 1 tablet by mouth daily.     Multiple Vitamin (MULTIVITAMIN PO) Take by mouth.     Multiple Vitamins-Minerals (PRESERVISION AREDS 2 PO) Take by mouth.     nicotine polacrilex (NICORETTE) 2 MG gum Take 2 mg by mouth as needed. Not smoking     NONFORMULARY OR COMPOUNDED ITEM Vaginal estradiol cream 0.02%.  1/2 gram per vagina at bedtime up to two times weekly.   Disp:  8 grams. 1 each 11   valACYclovir (VALTREX) 1000 MG tablet 2 tabs (2gm) x 1, repeat in 12 hours with symptom onset 30 tablet 2   No current facility-administered medications on file prior to visit.    Allergies  Allergen Reactions   Ciprofloxacin Other (See Comments)    Musculskeletal pain.   Codeine Nausea And Vomiting   Macrobid [Nitrofurantoin] Nausea Only and Other (See Comments)    Dizzy, headache,   Sulfa Antibiotics Nausea Only    ROS  Exam:   BP (!) 162/69 (BP Location: Right Arm, Patient Position: Sitting, Cuff Size: Normal)    Pulse 66    Ht 5\' 2"  (1.575 m) Comment: reported   Wt 110 lb 9.6 oz (50.2 kg)    LMP 02/16/1988 (LMP Unknown)    BMI 20.23 kg/m   General appearance: alert, cooperative, and no distress Inguinal lymph nodes:  not enlarged  Pelvic: External genitalia:  no lesions              Urethra: normal appearing urethra with no masses, tenderness or lesions              Bartholins and Skenes: Bartholin's, Urethra, Skene's normal  Vagina: normal appearing vagina with normal color and discharge, no lesions              Cervix: normal appearance   Pap obtained:  no Bimanual Exam:  Uterus:  uterus is normal size, shape, consistency and nontender                               Adnexa:    normal adnexa in size, nontender and no masses                               Anus:  normal sphincter tone, no lesions  Pessary was reinserted without difficulty.    Assessment/Plan: 1. Acute midline low back pain without sciatica - Urine Culture  2. Urinary urgency  3. Urinary frequency - will go ahead and treat as she often has a UTI with these symptoms - cephALEXin (KEFLEX) 250 MG capsule; Take 1 capsule (250 mg total) by mouth 4 (four) times daily for 5 days.  Dispense: 20 capsule; Refill: 0 - POCT Urinalysis Dipstick  4.  Cystocele  5.  Pessary use - recheck 4-6 weeks.

## 2021-02-25 LAB — UIFE/LIGHT CHAINS/TP QN, 24-HR UR
FR KAPPA LT CH,24HR: 573.25 mg/24 hr
FR LAMBDA LT CH,24HR: <2.08 mg/24 hr
Free Kappa Lt Chains,Ur: 184.92 mg/L — ABNORMAL HIGH (ref 1.17–86.46)
Free Lambda Lt Chains,Ur: <0.67 mg/L (ref 0.27–15.21)
Total Protein, Urine-Ur/day: 143 mg/24 hr (ref 30–150)
Total Protein, Urine: 4.6 mg/dL
Total Volume: 3100

## 2021-03-06 ENCOUNTER — Other Ambulatory Visit (HOSPITAL_BASED_OUTPATIENT_CLINIC_OR_DEPARTMENT_OTHER): Payer: Self-pay | Admitting: Obstetrics & Gynecology

## 2021-03-06 ENCOUNTER — Encounter (HOSPITAL_BASED_OUTPATIENT_CLINIC_OR_DEPARTMENT_OTHER): Payer: Self-pay | Admitting: Obstetrics & Gynecology

## 2021-03-06 MED ORDER — AMOXICILLIN-POT CLAVULANATE 500-125 MG PO TABS: 1.0000 | ORAL_TABLET | Freq: Two times a day (BID) | ORAL | 0 refills | Status: DC

## 2021-03-16 DIAGNOSIS — Z87891 Personal history of nicotine dependence: Secondary | ICD-10-CM | POA: Diagnosis not present

## 2021-03-16 DIAGNOSIS — I7300 Raynaud's syndrome without gangrene: Secondary | ICD-10-CM | POA: Diagnosis not present

## 2021-03-16 DIAGNOSIS — Z823 Family history of stroke: Secondary | ICD-10-CM | POA: Diagnosis not present

## 2021-03-16 DIAGNOSIS — Z881 Allergy status to other antibiotic agents status: Secondary | ICD-10-CM | POA: Diagnosis not present

## 2021-03-16 DIAGNOSIS — C9000 Multiple myeloma not having achieved remission: Secondary | ICD-10-CM | POA: Diagnosis not present

## 2021-03-16 DIAGNOSIS — J029 Acute pharyngitis, unspecified: Secondary | ICD-10-CM | POA: Diagnosis not present

## 2021-03-16 DIAGNOSIS — Z882 Allergy status to sulfonamides status: Secondary | ICD-10-CM | POA: Diagnosis not present

## 2021-03-16 DIAGNOSIS — Z885 Allergy status to narcotic agent status: Secondary | ICD-10-CM | POA: Diagnosis not present

## 2021-03-16 DIAGNOSIS — J449 Chronic obstructive pulmonary disease, unspecified: Secondary | ICD-10-CM | POA: Diagnosis not present

## 2021-03-24 DIAGNOSIS — B029 Zoster without complications: Secondary | ICD-10-CM | POA: Diagnosis not present

## 2021-03-24 DIAGNOSIS — Z85820 Personal history of malignant melanoma of skin: Secondary | ICD-10-CM | POA: Diagnosis not present

## 2021-03-25 DIAGNOSIS — M899 Disorder of bone, unspecified: Secondary | ICD-10-CM | POA: Diagnosis not present

## 2021-03-25 DIAGNOSIS — C9001 Multiple myeloma in remission: Secondary | ICD-10-CM | POA: Diagnosis not present

## 2021-03-25 DIAGNOSIS — C9000 Multiple myeloma not having achieved remission: Secondary | ICD-10-CM | POA: Diagnosis not present

## 2021-04-03 DIAGNOSIS — C9001 Multiple myeloma in remission: Secondary | ICD-10-CM | POA: Diagnosis not present

## 2021-04-03 DIAGNOSIS — D649 Anemia, unspecified: Secondary | ICD-10-CM | POA: Diagnosis not present

## 2021-04-03 DIAGNOSIS — C9000 Multiple myeloma not having achieved remission: Secondary | ICD-10-CM | POA: Diagnosis not present

## 2021-04-06 DIAGNOSIS — C9000 Multiple myeloma not having achieved remission: Secondary | ICD-10-CM | POA: Diagnosis not present

## 2021-04-15 ENCOUNTER — Ambulatory Visit (HOSPITAL_BASED_OUTPATIENT_CLINIC_OR_DEPARTMENT_OTHER): Payer: Medicare PPO | Admitting: Obstetrics & Gynecology

## 2021-04-15 ENCOUNTER — Other Ambulatory Visit: Payer: Self-pay

## 2021-04-15 ENCOUNTER — Encounter (HOSPITAL_BASED_OUTPATIENT_CLINIC_OR_DEPARTMENT_OTHER): Payer: Self-pay | Admitting: Obstetrics & Gynecology

## 2021-04-15 VITALS — BP 160/80 | HR 72 | Resp 14

## 2021-04-15 DIAGNOSIS — N898 Other specified noninflammatory disorders of vagina: Secondary | ICD-10-CM

## 2021-04-15 DIAGNOSIS — I10 Essential (primary) hypertension: Secondary | ICD-10-CM | POA: Diagnosis not present

## 2021-04-15 DIAGNOSIS — T8389XA Other specified complication of genitourinary prosthetic devices, implants and grafts, initial encounter: Secondary | ICD-10-CM | POA: Diagnosis not present

## 2021-04-15 DIAGNOSIS — N8110 Cystocele, unspecified: Secondary | ICD-10-CM | POA: Diagnosis not present

## 2021-04-15 DIAGNOSIS — Z87440 Personal history of urinary (tract) infections: Secondary | ICD-10-CM | POA: Diagnosis not present

## 2021-04-15 MED ORDER — AMOXICILLIN-POT CLAVULANATE 500-125 MG PO TABS: 1.0000 | ORAL_TABLET | Freq: Two times a day (BID) | ORAL | 0 refills | Status: DC

## 2021-04-15 NOTE — Progress Notes (Signed)
81 y.o. Widowed White female G1P1001 here for pessary check.  She reports some vaginal discharge and a little bit of bleeding.  She is not sexually active.   ? ?Patient has been using following pessary style and size:  #3 milex incontinence ring with support. ? ?Has been diagnosed with multiple myeloma.  Is at [facility] and really pleased with care there.  Will be starting treatment this month.  Goal is maintenance as this is early in disease process per pt. ? ?Past Medical History:  ?Diagnosis Date  ? COPD (chronic obstructive pulmonary disease) ([place])   ? History of chicken pox   ? HSV-1 infection   ? Hx of colonic polyps   ? Hypercholesterolemia   ? Hypothyroidism   ? Melanoma of thigh ([place]) 02/15/2011  ? Multiple myeloma ([place])   ? followed at [facility]  ? MVP (mitral valve prolapse)   ? Osteopenia   ? Prolapsed uterus   ? Raynaud disease   ? Varicose vein   ? ? ?Current Outpatient Medications on File Prior to Visit  ?Medication Sig Dispense Refill  ? amoxicillin-clavulanate (AUGMENTIN) 500-125 MG tablet Take 1 tablet (500 mg total) by mouth 2 (two) times daily. 14 tablet 0  ? acyclovir ointment (ZOVIRAX) 5 % Apply topically every 3 (three) hours. Use as neede for cold sores 15 g 0  ? amLODipine (NORVASC) 5 MG tablet Take 1 tablet by mouth daily.    ? aspirin 81 MG EC tablet See admin instructions.    ? Calcium Carbonate-Vitamin D (CALCIUM + D PO) Take by mouth daily.    ? Cholecalciferol (VITAMIN D) 2000 UNITS CAPS Take 1 capsule by mouth daily.    ? fluticasone (FLONASE) 50 MCG/ACT nasal spray PLACE 2 SPRAYS INTO BOTH NOSTRILS AS NEEDED. 16 g 5  ? hydrocortisone 2.5 % cream APPLY TOPICALLY 3 (THREE) TIMES DAILY. USE AS NEEDED. 28 g 3  ? IBUPROFEN PO Take 200 mg by mouth. At bedtime    ? levothyroxine (SYNTHROID, LEVOTHROID) 88 MCG tablet Take 1 tablet (88 mcg total) by mouth daily before breakfast. 30 tablet 11  ? LUTEIN PO Take 1 tablet by mouth daily.    ? Multiple Vitamin (MULTIVITAMIN PO) Take by mouth.    ? Multiple  Vitamins-Minerals (PRESERVISION AREDS 2 PO) Take by mouth.    ? nicotine polacrilex (NICORETTE) 2 MG gum Take 2 mg by mouth as needed. Not smoking    ? NONFORMULARY OR COMPOUNDED ITEM Vaginal estradiol cream 0.02%.  1/2 gram per vagina at bedtime up to two times weekly.  Disp:  8 grams. 1 each 11  ? valACYclovir (VALTREX) 1000 MG tablet 2 tabs (2gm) x 1, repeat in 12 hours with symptom onset 30 tablet 2  ? ?No current facility-administered medications on file prior to visit.  ? ? ?Allergies  ?Allergen Reactions  ? Ciprofloxacin Other (See Comments)  ?  Musculskeletal pain.  ? Codeine Nausea And Vomiting  ? Macrobid [Nitrofurantoin] Nausea Only and Other (See Comments)  ?  Dizzy, headache,  ? Sulfa Antibiotics Nausea Only  ? ? ?Review of Systems  ?All other systems reviewed and are negative. ? ?Exam:   ?BP (!) 160/80   Pulse 72   Resp 14   LMP 02/16/1988 (LMP Unknown)  ? ?General appearance: alert and no distress ?Inguinal lymph nodes:  not enlarged ? ?Pelvic: External genitalia:  no lesions ?             Urethra: normal appearing urethra with no  masses, tenderness or lesions ?             Bartholins and Skenes: Bartholin's, Urethra, Skene's normal    ?             Vagina: normal appearing vagina with normal color and discharge, superficial erosion on posterior side of cervix ?             Cervix: normal appearance ?  Pap obtained:  no ?Bimanual Exam:  Uterus:  uterus is normal size, shape, consistency and nontender ?                              Adnexa:    normal adnexa in size, nontender and no masses ?                              Anus:  normal sphincter tone, no lesions ? ?Pessary was removed without difficulty.  Pessary was cleansed.  Pessary was not replaced. Patient tolerated procedure well.   ? ?Assessment/Plan: ?1. 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 ?2. Vaginal erosion secondary to pessary use, subsequent encounter ?- will leave out pessary for two weeks and then replace ? ?3. Elevated systolic blood pressure reading  with diagnosis of hypertension ? ?4. History of UTI ?- amoxicillin-clavulanate (AUGMENTIN) 500-125 MG tablet; Take 1 tablet (500 mg total) by mouth 2 (two) times daily.  Dispense: 14 tablet; Refill: 0  ? ?

## 2021-04-20 DIAGNOSIS — Z87891 Personal history of nicotine dependence: Secondary | ICD-10-CM | POA: Diagnosis not present

## 2021-04-20 DIAGNOSIS — Z5111 Encounter for antineoplastic chemotherapy: Secondary | ICD-10-CM | POA: Diagnosis not present

## 2021-04-20 DIAGNOSIS — C9000 Multiple myeloma not having achieved remission: Secondary | ICD-10-CM | POA: Diagnosis not present

## 2021-04-20 DIAGNOSIS — M25559 Pain in unspecified hip: Secondary | ICD-10-CM | POA: Diagnosis not present

## 2021-04-20 DIAGNOSIS — Z882 Allergy status to sulfonamides status: Secondary | ICD-10-CM | POA: Diagnosis not present

## 2021-04-20 DIAGNOSIS — J449 Chronic obstructive pulmonary disease, unspecified: Secondary | ICD-10-CM | POA: Diagnosis not present

## 2021-04-20 DIAGNOSIS — E039 Hypothyroidism, unspecified: Secondary | ICD-10-CM | POA: Diagnosis not present

## 2021-04-20 DIAGNOSIS — Z881 Allergy status to other antibiotic agents status: Secondary | ICD-10-CM | POA: Diagnosis not present

## 2021-04-20 DIAGNOSIS — Z885 Allergy status to narcotic agent status: Secondary | ICD-10-CM | POA: Diagnosis not present

## 2021-04-24 ENCOUNTER — Other Ambulatory Visit (HOSPITAL_BASED_OUTPATIENT_CLINIC_OR_DEPARTMENT_OTHER): Payer: Self-pay | Admitting: *Deleted

## 2021-04-24 DIAGNOSIS — Z87440 Personal history of urinary (tract) infections: Secondary | ICD-10-CM

## 2021-04-24 MED ORDER — AMOXICILLIN-POT CLAVULANATE 500-125 MG PO TABS: 1.0000 | ORAL_TABLET | Freq: Two times a day (BID) | ORAL | 0 refills | Status: DC

## 2021-04-24 NOTE — Progress Notes (Signed)
Pt states that Dr. Sabra Heck had said she would send in a prescription for an antibiotic. She called the pharmacy and they do not have it. Pt did check with oncology and was advised that it is ok for her to take the antibiotic. A new prescription was sent to pharmacy.  ?

## 2021-04-29 ENCOUNTER — Ambulatory Visit (HOSPITAL_BASED_OUTPATIENT_CLINIC_OR_DEPARTMENT_OTHER): Payer: Medicare PPO | Admitting: Obstetrics & Gynecology

## 2021-04-29 ENCOUNTER — Encounter (HOSPITAL_BASED_OUTPATIENT_CLINIC_OR_DEPARTMENT_OTHER): Payer: Self-pay | Admitting: Obstetrics & Gynecology

## 2021-04-29 ENCOUNTER — Other Ambulatory Visit: Payer: Self-pay

## 2021-04-29 VITALS — BP 140/72 | HR 77 | Ht 62.0 in | Wt 109.0 lb

## 2021-04-29 DIAGNOSIS — Z87440 Personal history of urinary (tract) infections: Secondary | ICD-10-CM

## 2021-04-29 DIAGNOSIS — Z4689 Encounter for fitting and adjustment of other specified devices: Secondary | ICD-10-CM

## 2021-04-29 DIAGNOSIS — N8110 Cystocele, unspecified: Secondary | ICD-10-CM | POA: Diagnosis not present

## 2021-04-29 NOTE — Progress Notes (Signed)
81 y.o. Widowed Black female G1P1001 here for pessary placement.  Discharge and odor stopped.  Had some issues with UTI symptoms last week and started antibiotics.  Symptoms are all resolved..  She reports no complaints.  She did her first two infusions for multiple myeloma and has done well with these.  She is not sexually active.   ? ?Patient has been using following pessary style and size:  #3 incontinence ring with support. ? ?Past Medical History:  ?Diagnosis Date  ? COPD (chronic obstructive pulmonary disease) ([place])   ? History of chicken pox   ? HSV-1 infection   ? Hx of colonic polyps   ? Hypercholesterolemia   ? Hypothyroidism   ? Melanoma of thigh ([place]) 02/15/2011  ? Multiple myeloma ([place])   ? [facility] at [facility]  ? MVP (mitral valve prolapse)   ? Osteopenia   ? Prolapsed uterus   ? Raynaud disease   ? Varicose vein   ? ? ?Current Outpatient Medications on File Prior to Visit  ?Medication Sig Dispense Refill  ? acyclovir ointment (ZOVIRAX) 5 % Apply topically every 3 (three) hours. Use as neede for cold sores 15 g 0  ? amLODipine (NORVASC) 5 MG tablet Take 1 tablet by mouth daily.    ? amoxicillin-clavulanate (AUGMENTIN) 500-125 MG tablet Take 1 tablet (500 mg total) by mouth 2 (two) times daily. 14 tablet 0  ? aspirin 81 MG EC tablet See admin instructions.    ? Calcium Carbonate-Vitamin D (CALCIUM + D PO) Take by mouth daily.    ? Cholecalciferol (VITAMIN D) 2000 UNITS CAPS Take 1 capsule by mouth daily.    ? fluticasone (FLONASE) 50 MCG/ACT nasal spray PLACE 2 SPRAYS INTO BOTH NOSTRILS AS NEEDED. 16 g 5  ? hydrocortisone 2.5 % cream APPLY TOPICALLY 3 (THREE) TIMES DAILY. USE AS NEEDED. 28 g 3  ? IBUPROFEN PO Take 200 mg by mouth. At bedtime    ? levothyroxine (SYNTHROID, LEVOTHROID) 88 MCG tablet Take 1 tablet (88 mcg total) by mouth daily before breakfast. 30 tablet 11  ? LUTEIN PO Take 1 tablet by mouth daily.    ? Multiple Vitamin (MULTIVITAMIN PO) Take by mouth.    ? Multiple Vitamins-Minerals  (PRESERVISION AREDS 2 PO) Take by mouth.    ? nicotine polacrilex (NICORETTE) 2 MG gum Take 2 mg by mouth as needed. Not smoking    ? NONFORMULARY OR COMPOUNDED ITEM Vaginal estradiol cream 0.02%.  1/2 gram per vagina at bedtime up to two times weekly.  Disp:  8 grams. 1 each 11  ? valACYclovir (VALTREX) 1000 MG tablet 2 tabs (2gm) x 1, repeat in 12 hours with symptom onset 30 tablet 2  ? ?No current facility-administered medications on file prior to visit.  ? ? ?Allergies  ?Allergen Reactions  ? Ciprofloxacin Other (See Comments)  ?  Musculskeletal pain.  ? Codeine Nausea And Vomiting  ? Macrobid [Nitrofurantoin] Nausea Only and Other (See Comments)  ?  Dizzy, headache,  ? Sulfa Antibiotics Nausea Only  ? ? ?Review of Systems  ?Constitutional: Negative.   ?Genitourinary: Negative.   ? ?Exam:   ?LMP 02/16/1988 (LMP Unknown)  ? ?General appearance: alert and no distress ?Inguinal lymph nodes:  not enlarged ? ?Pelvic: External genitalia:  no lesions ?             Urethra: normal appearing urethra with no masses, tenderness or lesions ?             Bartholins and  Skenes: Bartholin's, Urethra, Skene's normal    ?             Vagina: normal appearing vagina with normal color and discharge, no lesions, posterior mucosa without any lesions ?             Cervix: normal appearance ?  Pap obtained:  no ?Bimanual Exam:  Uterus:  uterus is normal size, shape, consistency and nontender ?                              Adnexa:    normal adnexa in size, nontender and no masses ?                              Anus:  normal sphincter tone, no lesions ? ?Pessary was replaced. Patient tolerated procedure well.   ? ?Assessment/Plan: ?1. 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 ?2. History of UTI ? ?3. Pessary maintenance ? ? ?

## 2021-05-04 ENCOUNTER — Ambulatory Visit (HOSPITAL_BASED_OUTPATIENT_CLINIC_OR_DEPARTMENT_OTHER): Payer: Medicare PPO | Admitting: Obstetrics & Gynecology

## 2021-05-06 DIAGNOSIS — C9000 Multiple myeloma not having achieved remission: Secondary | ICD-10-CM | POA: Diagnosis not present

## 2021-05-06 DIAGNOSIS — M25551 Pain in right hip: Secondary | ICD-10-CM | POA: Diagnosis not present

## 2021-05-07 ENCOUNTER — Ambulatory Visit (HOSPITAL_BASED_OUTPATIENT_CLINIC_OR_DEPARTMENT_OTHER): Payer: Medicare PPO | Admitting: Obstetrics & Gynecology

## 2021-05-08 DIAGNOSIS — M79605 Pain in left leg: Secondary | ICD-10-CM | POA: Diagnosis not present

## 2021-05-08 DIAGNOSIS — M79604 Pain in right leg: Secondary | ICD-10-CM | POA: Diagnosis not present

## 2021-05-08 DIAGNOSIS — R600 Localized edema: Secondary | ICD-10-CM | POA: Diagnosis not present

## 2021-05-18 DIAGNOSIS — Z885 Allergy status to narcotic agent status: Secondary | ICD-10-CM | POA: Diagnosis not present

## 2021-05-18 DIAGNOSIS — C9000 Multiple myeloma not having achieved remission: Secondary | ICD-10-CM | POA: Diagnosis not present

## 2021-05-18 DIAGNOSIS — Z882 Allergy status to sulfonamides status: Secondary | ICD-10-CM | POA: Diagnosis not present

## 2021-05-18 DIAGNOSIS — J449 Chronic obstructive pulmonary disease, unspecified: Secondary | ICD-10-CM | POA: Diagnosis not present

## 2021-05-18 DIAGNOSIS — T451X5A Adverse effect of antineoplastic and immunosuppressive drugs, initial encounter: Secondary | ICD-10-CM | POA: Diagnosis not present

## 2021-05-18 DIAGNOSIS — Z5112 Encounter for antineoplastic immunotherapy: Secondary | ICD-10-CM | POA: Diagnosis not present

## 2021-05-18 DIAGNOSIS — K5903 Drug induced constipation: Secondary | ICD-10-CM | POA: Diagnosis not present

## 2021-05-18 DIAGNOSIS — M1611 Unilateral primary osteoarthritis, right hip: Secondary | ICD-10-CM | POA: Diagnosis not present

## 2021-05-18 DIAGNOSIS — R530 Neoplastic (malignant) related fatigue: Secondary | ICD-10-CM | POA: Diagnosis not present

## 2021-05-19 ENCOUNTER — Ambulatory Visit (HOSPITAL_BASED_OUTPATIENT_CLINIC_OR_DEPARTMENT_OTHER): Payer: Medicare PPO | Admitting: Obstetrics & Gynecology

## 2021-05-19 ENCOUNTER — Encounter (HOSPITAL_BASED_OUTPATIENT_CLINIC_OR_DEPARTMENT_OTHER): Payer: Self-pay | Admitting: Obstetrics & Gynecology

## 2021-05-19 VITALS — BP 140/67 | HR 104 | Ht 62.0 in | Wt 104.0 lb

## 2021-05-19 DIAGNOSIS — N939 Abnormal uterine and vaginal bleeding, unspecified: Secondary | ICD-10-CM

## 2021-05-19 DIAGNOSIS — T8389XA Other specified complication of genitourinary prosthetic devices, implants and grafts, initial encounter: Secondary | ICD-10-CM | POA: Diagnosis not present

## 2021-05-19 DIAGNOSIS — N898 Other specified noninflammatory disorders of vagina: Secondary | ICD-10-CM | POA: Diagnosis not present

## 2021-05-19 DIAGNOSIS — Z960 Presence of urogenital implants: Secondary | ICD-10-CM

## 2021-05-19 MED ORDER — GABAPENTIN 100 MG PO CAPS: 100.0000 mg | ORAL_CAPSULE | Freq: Every day | ORAL | Status: DC

## 2021-05-19 MED ORDER — CYCLOBENZAPRINE HCL 10 MG PO TABS: 10.0000 mg | ORAL_TABLET | Freq: Three times a day (TID) | ORAL | 1 refills | Status: DC | PRN

## 2021-05-19 NOTE — Patient Instructions (Signed)
[place], [place] ?Garret Reddish, [place] ?

## 2021-05-19 NOTE — Progress Notes (Signed)
81 y.o. Widowed White female G1P1001 here for pessary check.  She reports she's noted vaginal bleeding so feels pessary needs removal.  Reports she is going to have radiation on spot in hip that is causing pain.  This is from the multiple myeloma.  Also wants pessary removed while she is having radiation.  This is not scheduled but is supposed to get a call today.  She is not sexually active.  Denies urinary symptoms.   ? ?Patient has been using following pessary style and size:  incontinence ring with support,  #3. ? ?Past Medical History:  ?Diagnosis Date  ? COPD (chronic obstructive pulmonary disease) ([place])   ? History of chicken pox   ? HSV-1 infection   ? Hx of colonic polyps   ? Hypercholesterolemia   ? Hypothyroidism   ? 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 Multiple myeloma ([place])   ? followed at [facility]  ? MVP (mitral valve prolapse)   ? Osteopenia   ? Prolapsed uterus   ? Raynaud disease   ? Varicose vein   ? ? ?Current Outpatient Medications on File Prior to Visit  ?Medication Sig Dispense Refill  ? acyclovir ointment (ZOVIRAX) 5 % Apply topically every 3 (three) hours. Use as neede for cold sores 15 g 0  ? amLODipine (NORVASC) 5 MG tablet Take 1 tablet by mouth daily.    ? amoxicillin-clavulanate (AUGMENTIN) 500-125 MG tablet Take 1 tablet (500 mg total) by mouth 2 (two) times daily. 14 tablet 0  ? aspirin 81 MG EC tablet See admin instructions.    ? Calcium Carbonate-Vitamin D (CALCIUM + D PO) Take by mouth daily.    ? Cholecalciferol (VITAMIN D) 2000 UNITS CAPS Take 1 capsule by mouth daily.    ? fluticasone (FLONASE) 50 MCG/ACT nasal spray PLACE 2 SPRAYS INTO BOTH NOSTRILS AS NEEDED. 16 g 5  ? hydrocortisone 2.5 % cream APPLY TOPICALLY 3 (THREE) TIMES DAILY. USE AS NEEDED. 28 g 3  ? IBUPROFEN PO Take 200 mg by mouth. At bedtime    ? levothyroxine (SYNTHROID, LEVOTHROID) 88 MCG tablet Take 1 tablet (88 mcg total) by mouth daily before breakfast. 30 tablet 11  ? LUTEIN PO Take 1 tablet by mouth daily.    ?  Multiple Vitamin (MULTIVITAMIN PO) Take by mouth.    ? Multiple Vitamins-Minerals (PRESERVISION AREDS 2 PO) Take by mouth.    ? nicotine polacrilex (NICORETTE) 2 MG gum Take 2 mg by mouth as needed. Not smoking    ? NONFORMULARY OR COMPOUNDED ITEM Vaginal estradiol cream 0.02%.  1/2 gram per vagina at bedtime up to two times weekly.  Disp:  8 grams. 1 each 11  ? valACYclovir (VALTREX) 1000 MG tablet 2 tabs (2gm) x 1, repeat in 12 hours with symptom onset 30 tablet 2  ? ?No current facility-administered medications on file prior to visit.  ? ? ?Allergies  ?Allergen Reactions  ? Ciprofloxacin Other (See Comments)  ?  Musculskeletal pain.  ? Codeine Nausea And Vomiting  ? Macrobid [Nitrofurantoin] Nausea Only and Other (See Comments)  ?  Dizzy, headache,  ? Sulfa Antibiotics Nausea Only  ? ? ?Review of Systems  ?Genitourinary:   ?     PMP bleeding/spotting  ? ?Exam:   ?BP 140/67 (BP Location: Left Arm, Patient Position: Sitting, Cuff Size: Normal)   Pulse (!) 104   Ht '5\' 2"'  (1.575 m) Comment: Reported  Wt 104 lb (47.2 kg)   LMP 02/16/1988 (LMP Unknown)  BMI 19.02 kg/m?  ? ?General appearance: alert and no distress ?Inguinal lymph nodes:  not enlarged ? ?Pelvic: External genitalia:  no lesions ?             Urethra: normal appearing urethra with no masses, tenderness or lesions ?             Bartholins and Skenes: Bartholin's, Urethra, Skene's normal    ?             Vagina: normal appearing vagina with normal color and very small area of superficial irritation posterior to cervix in same location as always when pt has some bleeding ?             Cervix: normal appearance ?  Pap obtained:  no ? ?Pessary was removed without difficulty.  Pessary was cleansed.  Pessary was not replaced. Patient tolerated procedure well.   ? ?1. Vaginal erosion secondary to pessary use, subsequent encounter ?- as pt is going to have radiation, she desires pessary to be left out.  Will let me know when ready for replacement ? ?2.  Vaginal bleeding ? ?3. Presence of pessary ?  ? ?

## 2021-05-21 DIAGNOSIS — C9000 Multiple myeloma not having achieved remission: Secondary | ICD-10-CM | POA: Diagnosis not present

## 2021-05-26 ENCOUNTER — Encounter (HOSPITAL_BASED_OUTPATIENT_CLINIC_OR_DEPARTMENT_OTHER): Payer: Self-pay | Admitting: *Deleted

## 2021-06-01 DIAGNOSIS — Z6820 Body mass index (BMI) 20.0-20.9, adult: Secondary | ICD-10-CM | POA: Diagnosis not present

## 2021-06-01 DIAGNOSIS — Z5112 Encounter for antineoplastic immunotherapy: Secondary | ICD-10-CM | POA: Diagnosis not present

## 2021-06-01 DIAGNOSIS — R634 Abnormal weight loss: Secondary | ICD-10-CM | POA: Diagnosis not present

## 2021-06-01 DIAGNOSIS — E440 Moderate protein-calorie malnutrition: Secondary | ICD-10-CM | POA: Diagnosis not present

## 2021-06-01 DIAGNOSIS — C9000 Multiple myeloma not having achieved remission: Secondary | ICD-10-CM | POA: Diagnosis not present

## 2021-06-01 DIAGNOSIS — M25561 Pain in right knee: Secondary | ICD-10-CM | POA: Diagnosis not present

## 2021-06-10 ENCOUNTER — Encounter (HOSPITAL_BASED_OUTPATIENT_CLINIC_OR_DEPARTMENT_OTHER): Payer: Self-pay | Admitting: Obstetrics & Gynecology

## 2021-06-10 ENCOUNTER — Ambulatory Visit (HOSPITAL_BASED_OUTPATIENT_CLINIC_OR_DEPARTMENT_OTHER): Payer: Medicare PPO | Admitting: Obstetrics & Gynecology

## 2021-06-10 VITALS — BP 134/57 | HR 58 | Ht 62.0 in | Wt 105.0 lb

## 2021-06-10 DIAGNOSIS — N8110 Cystocele, unspecified: Secondary | ICD-10-CM

## 2021-06-13 NOTE — Progress Notes (Signed)
81 y.o. Widowed White female G1P1001 here for pessary insertion.  Bleeding, discharge and odor has stopped.  Denies urinary symptoms.  She is not sexually active.   ? ?Patient has been using following pessary style and size:  #3 [place]x. ? ?Past Medical History:  ?Diagnosis Date  ? COPD (chronic obstructive pulmonary disease) ([place])   ? History of chicken pox   ? HSV-1 infection   ? Hx of colonic polyps   ? Hypercholesterolemia   ? Hypothyroidism   ? 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 Multiple myeloma ([place])   ? followed at [facility]  ? MVP (mitral valve prolapse)   ? Osteopenia   ? Prolapsed uterus   ? Raynaud disease   ? Varicose vein   ? ? ?Current Outpatient Medications on File Prior to Visit  ?Medication Sig Dispense Refill  ? acyclovir ointment (ZOVIRAX) 5 % Apply topically every 3 (three) hours. Use as neede for cold sores 15 g 0  ? amLODipine (NORVASC) 5 MG tablet Take 1 tablet by mouth daily.    ? amoxicillin-clavulanate (AUGMENTIN) 500-125 MG tablet Take 1 tablet (500 mg total) by mouth 2 (two) times daily. 14 tablet 0  ? aspirin 81 MG EC tablet See admin instructions.    ? Calcium Carbonate-Vitamin D (CALCIUM + D PO) Take by mouth daily.    ? Cholecalciferol (VITAMIN D) 2000 UNITS CAPS Take 1 capsule by mouth daily.    ? cyclobenzaprine (FLEXERIL) 10 MG tablet Take 1 tablet (10 mg total) by mouth every 8 (eight) hours as needed for muscle spasms. 30 tablet 1  ? fluticasone (FLONASE) 50 MCG/ACT nasal spray PLACE 2 SPRAYS INTO BOTH NOSTRILS AS NEEDED. 16 g 5  ? gabapentin (NEURONTIN) 100 MG capsule Take 1 capsule (100 mg total) by mouth at bedtime.    ? hydrocortisone 2.5 % cream APPLY TOPICALLY 3 (THREE) TIMES DAILY. USE AS NEEDED. 28 g 3  ? IBUPROFEN PO Take 200 mg by mouth. At bedtime    ? levothyroxine (SYNTHROID, LEVOTHROID) 88 MCG tablet Take 1 tablet (88 mcg total) by mouth daily before breakfast. 30 tablet 11  ? LUTEIN PO Take 1 tablet by mouth daily.    ? Multiple Vitamin (MULTIVITAMIN PO) Take by mouth.     ? Multiple Vitamins-Minerals (PRESERVISION AREDS 2 PO) Take by mouth.    ? nicotine polacrilex (NICORETTE) 2 MG gum Take 2 mg by mouth as needed. Not smoking    ? NONFORMULARY OR COMPOUNDED ITEM Vaginal estradiol cream 0.02%.  1/2 gram per vagina at bedtime up to two times weekly.  Disp:  8 grams. 1 each 11  ? valACYclovir (VALTREX) 1000 MG tablet 2 tabs (2gm) x 1, repeat in 12 hours with symptom onset 30 tablet 2  ? ?No current facility-administered medications on file prior to visit.  ? ? ?Allergies  ?Allergen Reactions  ? Ciprofloxacin Other (See Comments)  ?  Musculskeletal pain.  ? Codeine Nausea And Vomiting  ? Macrobid [Nitrofurantoin] Nausea Only and Other (See Comments)  ?  Dizzy, headache,  ? Sulfa Antibiotics Nausea Only  ? ? ?Review of Systems  ?Genitourinary: Negative.   ? ?Exam:   ?BP (!) 134/57 (BP Location: Right Arm, Patient Position: Sitting, Cuff Size: Normal)   Pulse (!) 58   Ht _0  (1.575 m) Comment: reported  Wt 105 lb (47.6 kg) Comment: reported\  LMP 02/16/1988 (LMP Unknown)   BMI 19.20 kg/m?  ? ?General appearance: no distress ?Inguinal lymph nodes:  not  enlarged ? ?Pelvic: External genitalia:  no lesions ?             Urethra: normal appearing urethra with no masses, tenderness or lesions ?             Bartholins and Skenes: Bartholin's, Urethra, Skene's normal    ?             Vagina: normal appearing vagina with normal color and discharge, no lesions ?             Pessary was replaced. Patient tolerated procedure well.   ? ?Assessment/Plan: ?1. Female cystocele ?- pessary replaced today.  Recheck 4-6 weeks or if has new symptoms.  ?

## 2021-06-29 DIAGNOSIS — M25551 Pain in right hip: Secondary | ICD-10-CM | POA: Diagnosis not present

## 2021-06-29 DIAGNOSIS — C9000 Multiple myeloma not having achieved remission: Secondary | ICD-10-CM | POA: Diagnosis not present

## 2021-06-29 DIAGNOSIS — R5383 Other fatigue: Secondary | ICD-10-CM | POA: Diagnosis not present

## 2021-06-29 DIAGNOSIS — Z7982 Long term (current) use of aspirin: Secondary | ICD-10-CM | POA: Diagnosis not present

## 2021-06-29 DIAGNOSIS — K5900 Constipation, unspecified: Secondary | ICD-10-CM | POA: Diagnosis not present

## 2021-06-29 DIAGNOSIS — Z885 Allergy status to narcotic agent status: Secondary | ICD-10-CM | POA: Diagnosis not present

## 2021-06-29 DIAGNOSIS — Z882 Allergy status to sulfonamides status: Secondary | ICD-10-CM | POA: Diagnosis not present

## 2021-06-29 DIAGNOSIS — J449 Chronic obstructive pulmonary disease, unspecified: Secondary | ICD-10-CM | POA: Diagnosis not present

## 2021-06-29 DIAGNOSIS — Z87891 Personal history of nicotine dependence: Secondary | ICD-10-CM | POA: Diagnosis not present

## 2021-07-24 DIAGNOSIS — K219 Gastro-esophageal reflux disease without esophagitis: Secondary | ICD-10-CM | POA: Diagnosis not present

## 2021-07-24 DIAGNOSIS — I10 Essential (primary) hypertension: Secondary | ICD-10-CM | POA: Diagnosis not present

## 2021-07-24 DIAGNOSIS — I480 Paroxysmal atrial fibrillation: Secondary | ICD-10-CM | POA: Diagnosis not present

## 2021-07-24 DIAGNOSIS — C9000 Multiple myeloma not having achieved remission: Secondary | ICD-10-CM | POA: Diagnosis not present

## 2021-07-24 DIAGNOSIS — I498 Other specified cardiac arrhythmias: Secondary | ICD-10-CM | POA: Diagnosis not present

## 2021-07-24 DIAGNOSIS — E039 Hypothyroidism, unspecified: Secondary | ICD-10-CM | POA: Diagnosis not present

## 2021-07-27 DIAGNOSIS — C9000 Multiple myeloma not having achieved remission: Secondary | ICD-10-CM | POA: Diagnosis not present

## 2021-07-29 ENCOUNTER — Encounter (HOSPITAL_COMMUNITY): Payer: Self-pay | Admitting: Physician Assistant

## 2021-07-29 ENCOUNTER — Ambulatory Visit (HOSPITAL_COMMUNITY)
Admission: RE | Admit: 2021-07-29 | Discharge: 2021-07-29 | Disposition: A | Discharge status: Home / Self Care | Payer: Medicare PPO | Source: Ambulatory Visit | Attending: Physician Assistant | Admitting: Physician Assistant

## 2021-07-29 VITALS — BP 144/90 | HR 86 | Ht 62.0 in | Wt 112.6 lb

## 2021-07-29 DIAGNOSIS — C9000 Multiple myeloma not having achieved remission: Secondary | ICD-10-CM | POA: Insufficient documentation

## 2021-07-29 DIAGNOSIS — I4819 Other persistent atrial fibrillation: Secondary | ICD-10-CM | POA: Diagnosis not present

## 2021-07-29 DIAGNOSIS — D6869 Other thrombophilia: Secondary | ICD-10-CM | POA: Insufficient documentation

## 2021-07-29 DIAGNOSIS — I10 Essential (primary) hypertension: Secondary | ICD-10-CM | POA: Insufficient documentation

## 2021-07-29 DIAGNOSIS — Z7901 Long term (current) use of anticoagulants: Secondary | ICD-10-CM | POA: Insufficient documentation

## 2021-07-29 DIAGNOSIS — E039 Hypothyroidism, unspecified: Secondary | ICD-10-CM | POA: Diagnosis not present

## 2021-07-29 MED ORDER — APIXABAN 2.5 MG PO TABS: 2.5000 mg | ORAL_TABLET | Freq: Two times a day (BID) | ORAL | 3 refills | Status: DC

## 2021-07-29 NOTE — Progress Notes (Signed)
Primary Care Physician: Lajean Manes, MD Primary Cardiologist: none Primary Electrophysiologist: none Referring Physician: Dr Irene Pap is a 81 y.o. female with a history of hypothyroidism, multiple myeloma, HTN, atrial fibrillation who presents for consultation in the [facility].  The patient was initially diagnosed with atrial fibrillation 07/24/21 after presenting to her PCP for routine follow up. ECG showed afib with elevated heart rates. She was started on Eliquis for a CHADS2VASC score of 4 and metoprolol for rate control. Patient does state that she had heart "fluttering" about 1-2 weeks before seeing her PCP, no other associated symptoms. No significant snoring or alcohol use.   Today, she denies symptoms of chest pain, shortness of breath, orthopnea, PND, lower extremity edema, dizziness, presyncope, syncope, snoring, daytime somnolence, bleeding, or neurologic sequela. The patient is tolerating medications without difficulties and is otherwise without complaint today.    Atrial Fibrillation Risk Factors:  she does not have symptoms or diagnosis of sleep apnea. she does not have a history of rheumatic fever. she does not have a history of alcohol use. The patient does not have a history of early familial atrial fibrillation or other arrhythmias.  she has a BMI of Body mass index is 20.59 kg/m.[name] Filed Weights   07/29/21 1117  Weight: 51.1 kg    Family History  Problem Relation Age of Onset   Hypertension Mother    Congestive Heart Failure Mother    Thyroid disease Mother    Hyperlipidemia Mother    Heart disease Mother    Stroke Father    Heart attack Father 23   Diabetes Paternal Grandmother    Diabetes Paternal Grandfather    Hypertension Maternal Grandfather    CVA Maternal Grandfather    Esophageal cancer Neg Hx    Rectal cancer Neg Hx    Stomach cancer Neg Hx    Colon cancer Neg Hx      Atrial Fibrillation  Management history:  Previous antiarrhythmic drugs: none Previous cardioversions: none Previous ablations: none CHADS2VASC score: 4 Anticoagulation history: Eliquis   Past Medical History:  Diagnosis Date   COPD (chronic obstructive pulmonary disease) ([place])    History of chicken pox    HSV-1 infection    Hx of colonic polyps    Hypercholesterolemia    Hypothyroidism    Melanoma of thigh ([place]) 02/15/2011   Multiple myeloma ([place])    followed at [facility]   MVP (mitral valve prolapse)    Osteopenia    Prolapsed uterus    Raynaud disease    Varicose vein    Past Surgical History:  Procedure Laterality Date   BREAST SURGERY     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 Patient does not remember exact date of surgery.   CATARACT EXTRACTION Right 10/25/2017   CESAREAN SECTION  1965   MELANOMA EXCISION  12/31/2010   Procedure: MELANOMA EXCISION;  Surgeon: Zenovia Jarred, MD;  Location: [facility];  Service: General;  Laterality: Left;  wide excision melanoma left thigh, excision lesion left thigh   OTHER SURGICAL HISTORY Right    Surgical repair of architectual distortion Right Breast- Hyperplasia   TONSILLECTOMY      Current Outpatient Medications  Medication Sig Dispense Refill   amLODipine (NORVASC) 5 MG tablet Take 1 tablet by mouth daily.     Calcium Carbonate-Vitamin D (CALCIUM + D PO) Take by mouth daily.     Cholecalciferol (VITAMIN D) 2000 UNITS CAPS Take  1 capsule by mouth daily.     Cranberry-Vitamin C-Probiotic (AZO CRANBERRY) 250-30 MG TABS See admin instructions.     dexamethasone (DECADRON) 4 MG tablet Take by mouth.     ELIQUIS 5 MG TABS tablet Take 5 mg by mouth 2 (two) times daily.     fluticasone (FLONASE) 50 MCG/ACT nasal spray PLACE 2 SPRAYS INTO BOTH NOSTRILS AS NEEDED. 16 g 5   gabapentin (NEURONTIN) 100 MG capsule Take 1 capsule (100 mg total) by mouth at bedtime.     hydrocortisone 2.5 % cream APPLY TOPICALLY 3 (THREE) TIMES DAILY. USE AS  NEEDED. 28 g 3   levothyroxine (SYNTHROID, LEVOTHROID) 88 MCG tablet Take 1 tablet (88 mcg total) by mouth daily before breakfast. 30 tablet 11   LUTEIN PO Take 1 tablet by mouth daily.     metoprolol succinate (TOPROL-XL) 25 MG 24 hr tablet Take 25 mg by mouth daily.     Multiple Vitamin (MULTIVITAMIN PO) Take by mouth.     Multiple Vitamins-Minerals (PRESERVISION AREDS 2 PO) Take by mouth.     nicotine polacrilex (NICORETTE) 2 MG gum Take 2 mg by mouth as needed. Not smoking     NONFORMULARY OR COMPOUNDED ITEM Vaginal estradiol cream 0.02%.  1/2 gram per vagina at bedtime up to two times weekly.  Disp:  8 grams. 1 each 11   REVLIMID 5 MG capsule Take by mouth.     valACYclovir (VALTREX) 1000 MG tablet 2 tabs (2gm) x 1, repeat in 12 hours with symptom onset 30 tablet 2   acyclovir ointment (ZOVIRAX) 5 % Apply topically every 3 (three) hours. Use as neede for cold sores (Patient not taking: Reported on 07/29/2021) 15 g 0   No current facility-administered medications for this encounter.    Allergies  Allergen Reactions   Ciprofloxacin Other (See Comments)    Musculskeletal pain.   Codeine Nausea And Vomiting   Macrobid [Nitrofurantoin] Nausea Only and Other (See Comments)    Dizzy, headache,   Sulfa Antibiotics Nausea Only    Social History   Socioeconomic History   Marital status: Widowed    Spouse name: fred Chiropractor   Number of children: Not on file   Years of education: Not on file   Highest education level: Not on file  Occupational History   Not on file  Tobacco Use   Smoking status: Former    Packs/day: 1.00    Years: 50.00    Total pack years: 50.00    Types: Cigarettes    Start date: 03/08/1960    Quit date: 07/23/2010    Years since quitting: 11.0   Smokeless tobacco: Never   Tobacco comments:    Former smoker 07/29/21  Vaping Use   Vaping Use: Never used  Substance and Sexual Activity   Alcohol use: No    Alcohol/week: 0.0 standard drinks of alcohol   Drug use:  No   Sexual activity: Not Currently    Partners: Male    Birth control/protection: Post-menopausal  Other Topics Concern   Not on file  Social History Narrative   Not on file   Social Determinants of Health   Financial Resource Strain: Not on file  Food Insecurity: Not on file  Transportation Needs: Not on file  Physical Activity: Not on file  Stress: Not on file  Social Connections: Not on file  Intimate Partner Violence: Not on file     ROS- All systems are reviewed and negative except as per the HPI  above.  Physical Exam: Vitals:   07/29/21 1117  BP: (!) 144/90  Pulse: 86  Weight: 51.1 kg  Height: '5\' 2"'  (1.575 m)    GEN- The patient is a well appearing elderly female, alert and oriented x 3 today.   Head- normocephalic, atraumatic Eyes-  Sclera clear, conjunctiva pink Ears- hearing intact Oropharynx- clear Neck- supple  Lungs- Clear to ausculation bilaterally, normal work of breathing Heart- irregular rate and rhythm, no rubs or gallops, 2-9/5 systolic murmur  GI- soft, NT, ND, + BS Extremities- no clubbing, cyanosis, or edema MS- no significant deformity or atrophy Skin- no rash or lesion Psych- euthymic mood, full affect Neuro- strength and sensation are intact  Wt Readings from Last 3 Encounters:  07/29/21 51.1 kg  06/10/21 47.6 kg  05/19/21 47.2 kg    EKG today demonstrates  Afib, PVCs Vent. rate 86 BPM PR interval * ms QRS duration 74 ms QT/QTcB 358/428 ms  Echo 01/04/17 demonstrated  Left ventricle: The cavity size was normal. Systolic function was    normal. The estimated ejection fraction was in the range of 60%    to 65%. Wall motion was normal; there were no regional wall    motion abnormalities. Doppler parameters are consistent with    abnormal left ventricular relaxation (grade 1 diastolic    dysfunction). Doppler parameters are consistent with elevated    ventricular end-diastolic filling pressure.  - Aortic valve: There was no  regurgitation.  - Mitral valve: Calcified annulus. Mildly thickened leaflets .    There was mild regurgitation.  - Left atrium: The atrium was mildly dilated.  - Right ventricle: Systolic function was normal.  - Right atrium: The atrium was normal in size.  - Tricuspid valve: There was mild regurgitation.  - Pulmonary arteries: Systolic pressure was within the normal    range.  - Inferior vena cava: The vessel was normal in size.  - Pericardium, extracardiac: There was no pericardial effusion.  Epic records are reviewed at length today  CHA2DS2-VASc Score = 4  The patient's score is based upon: CHF History: 0 HTN History: 1 Diabetes History: 0 Stroke History: 0 Vascular Disease History: 0 Age Score: 2 Gender Score: 1       ASSESSMENT AND PLAN: 1. Persistent Atrial Fibrillation (ICD10:  I48.19) The patient's CHA2DS2-VASc score is 4, indicating a 4.8% annual risk of stroke.   General education about afib provided and questions answered. We also discussed her stroke risk and the risks and benefits of anticoagulation. Patient questions if her afib is related to Revlimid, I don't see any arrhythmia adverse effects listed in insert. Patient plans to discuss with oncology.  We discussed rate vs rhythm control. She is clear in her decision to avoid DCCV. She is well rate controlled today.  Check echocardiogram Will reduce Eliquis to 2.5 mg BID (age >80, weight < 60 kg) Continue Toprol 25 mg daily  2. Secondary Hypercoagulable State (ICD10:  D68.69) The patient is at significant risk for stroke/thromboembolism based upon her CHA2DS2-VASc Score of 4.  Continue Apixaban (Eliquis).   3. Multiple myeloma Followed by Dr Sabino Dick at [facility]  4. HTN Stable, no changes today.   Follow up in the AF clinic in one month.    [facility] [address] [place], [place] [address] [phone] 07/29/2021 11:29 AM

## 2021-07-29 NOTE — Patient Instructions (Signed)
Decrease Eliquis to 2.5 mg twice a day

## 2021-08-05 ENCOUNTER — Ambulatory Visit (HOSPITAL_COMMUNITY)
Admission: RE | Admit: 2021-08-05 | Discharge: 2021-08-05 | Disposition: A | Discharge status: Home / Self Care | Payer: Medicare PPO | Source: Ambulatory Visit | Attending: Physician Assistant | Admitting: Physician Assistant

## 2021-08-05 DIAGNOSIS — I3481 Nonrheumatic mitral (valve) annulus calcification: Secondary | ICD-10-CM | POA: Diagnosis not present

## 2021-08-05 DIAGNOSIS — I4819 Other persistent atrial fibrillation: Secondary | ICD-10-CM

## 2021-08-05 LAB — ECHOCARDIOGRAM COMPLETE: S' Lateral: 2.2 cm

## 2021-08-05 NOTE — Progress Notes (Signed)
  Echocardiogram 2D Echocardiogram has been performed.  Johny Chess 08/05/2021, 11:34 AM

## 2021-08-07 ENCOUNTER — Ambulatory Visit (HOSPITAL_BASED_OUTPATIENT_CLINIC_OR_DEPARTMENT_OTHER): Payer: Medicare PPO | Admitting: Obstetrics & Gynecology

## 2021-08-07 ENCOUNTER — Encounter (HOSPITAL_BASED_OUTPATIENT_CLINIC_OR_DEPARTMENT_OTHER): Payer: Self-pay | Admitting: Obstetrics & Gynecology

## 2021-08-07 VITALS — BP 85/64 | HR 115 | Ht 62.0 in | Wt 109.8 lb

## 2021-08-07 DIAGNOSIS — Z960 Presence of urogenital implants: Secondary | ICD-10-CM | POA: Diagnosis not present

## 2021-08-07 DIAGNOSIS — N898 Other specified noninflammatory disorders of vagina: Secondary | ICD-10-CM | POA: Diagnosis not present

## 2021-08-07 DIAGNOSIS — N8110 Cystocele, unspecified: Secondary | ICD-10-CM | POA: Diagnosis not present

## 2021-08-07 DIAGNOSIS — Z4689 Encounter for fitting and adjustment of other specified devices: Secondary | ICD-10-CM

## 2021-08-27 ENCOUNTER — Ambulatory Visit (HOSPITAL_COMMUNITY): Payer: Medicare PPO | Admitting: Physician Assistant

## 2021-09-02 DIAGNOSIS — D72820 Lymphocytosis (symptomatic): Secondary | ICD-10-CM | POA: Diagnosis not present

## 2021-09-02 DIAGNOSIS — C9000 Multiple myeloma not having achieved remission: Secondary | ICD-10-CM | POA: Diagnosis not present

## 2021-09-03 ENCOUNTER — Encounter (HOSPITAL_COMMUNITY): Payer: Self-pay | Admitting: Physician Assistant

## 2021-09-03 ENCOUNTER — Ambulatory Visit (HOSPITAL_COMMUNITY)
Admission: RE | Admit: 2021-09-03 | Discharge: 2021-09-03 | Disposition: A | Discharge status: Home / Self Care | Payer: Medicare PPO | Source: Ambulatory Visit | Attending: Physician Assistant | Admitting: Physician Assistant

## 2021-09-03 VITALS — BP 140/80 | HR 96 | Ht 62.0 in | Wt 111.2 lb

## 2021-09-03 DIAGNOSIS — Z79899 Other long term (current) drug therapy: Secondary | ICD-10-CM | POA: Diagnosis not present

## 2021-09-03 DIAGNOSIS — D6869 Other thrombophilia: Secondary | ICD-10-CM | POA: Diagnosis not present

## 2021-09-03 DIAGNOSIS — I4819 Other persistent atrial fibrillation: Secondary | ICD-10-CM | POA: Insufficient documentation

## 2021-09-03 DIAGNOSIS — E039 Hypothyroidism, unspecified: Secondary | ICD-10-CM | POA: Insufficient documentation

## 2021-09-03 DIAGNOSIS — I10 Essential (primary) hypertension: Secondary | ICD-10-CM | POA: Diagnosis not present

## 2021-09-03 DIAGNOSIS — Z7901 Long term (current) use of anticoagulants: Secondary | ICD-10-CM | POA: Insufficient documentation

## 2021-09-03 DIAGNOSIS — C9000 Multiple myeloma not having achieved remission: Secondary | ICD-10-CM | POA: Insufficient documentation

## 2021-09-03 LAB — BASIC METABOLIC PANEL
Anion gap: 8 (ref 5–15)
BUN: 18 mg/dL (ref 8–23)
CO2: 22 mmol/L (ref 22–32)
Calcium: 9 mg/dL (ref 8.9–10.3)
Chloride: 109 mmol/L (ref 98–111)
Creatinine, Ser: 0.77 mg/dL (ref 0.44–1.00)
GFR, Estimated: >60 mL/min (ref >60)
Glucose, Bld: 82 mg/dL (ref 70–99)
Potassium: 4.7 mmol/L (ref 3.5–5.1)
Sodium: 139 mmol/L (ref 135–145)

## 2021-09-03 LAB — CBC
HCT: 38.4 % (ref 36.0–46.0)
Hemoglobin: 12.3 g/dL (ref 12.0–15.0)
MCH: 30.7 pg (ref 26.0–34.0)
MCHC: 32 g/dL (ref 30.0–36.0)
MCV: 95.8 fL (ref 80.0–100.0)
Platelets: 169 10*3/uL (ref 150–400)
RBC: 4.01 MIL/uL (ref 3.87–5.11)
RDW: 17.3 % — ABNORMAL HIGH (ref 11.5–15.5)
WBC: 10.2 10*3/uL (ref 4.0–10.5)
nRBC: 0 % (ref 0.0–0.2)

## 2021-09-03 MED ORDER — METOPROLOL SUCCINATE ER 25 MG PO TB24: 37.5000 mg | ORAL_TABLET | Freq: Every day | ORAL | 3 refills | Status: DC

## 2021-09-03 NOTE — Patient Instructions (Signed)
Increase metoprolol to 1 and 1/2 tablets daily (37.'5mg'$ )

## 2021-09-03 NOTE — Progress Notes (Signed)
Primary Care Physician: Lajean Manes, MD Primary Cardiologist: none Primary Electrophysiologist: none Referring Physician: Dr Irene Pap is a 81 y.o. female with a history of hypothyroidism, multiple myeloma, HTN, atrial fibrillation who presents for follow up in the [facility].  The patient was initially diagnosed with atrial fibrillation 07/24/21 after presenting to her PCP for routine follow up. ECG showed afib with elevated heart rates. She was started on Eliquis for a CHADS2VASC score of 4 and metoprolol for rate control. Patient does state that she had heart "fluttering" about 1-2 weeks before seeing her PCP, no other associated symptoms. No significant snoring or alcohol use.   On follow up today, patient has been under considerable stress with recent bone marrow biopsies and PET scan for her multiple myeloma. She reports that her hematologist listened to her heart and told her she was in [place]. She is back in afib today, not very symptomatic at this time. No bleeding issues on anticoagulation.   Today, she denies symptoms of palpitations, chest pain, shortness of breath, orthopnea, PND, lower extremity edema, dizziness, presyncope, syncope, snoring, daytime somnolence, bleeding, or neurologic sequela. The patient is tolerating medications without difficulties and is otherwise without complaint today.    Atrial Fibrillation Risk Factors:  she does not have symptoms or diagnosis of sleep apnea. she does not have a history of rheumatic fever. she does not have a history of alcohol use. The patient does not have a history of early familial atrial fibrillation or other arrhythmias.  she has a BMI of Body mass index is 20.34 kg/m.[name] Filed Weights   09/03/21 1151  Weight: 50.4 kg    Family History  Problem Relation Age of Onset   Hypertension Mother    Congestive Heart Failure Mother    Thyroid disease Mother    Hyperlipidemia Mother     Heart disease Mother    Stroke Father    Heart attack Father 53   Diabetes Paternal Grandmother    Diabetes Paternal Grandfather    Hypertension Maternal Grandfather    CVA Maternal Grandfather    Esophageal cancer Neg Hx    Rectal cancer Neg Hx    Stomach cancer Neg Hx    Colon cancer Neg Hx      Atrial Fibrillation Management history:  Previous antiarrhythmic drugs: none Previous cardioversions: none Previous ablations: none CHADS2VASC score: 4 Anticoagulation history: Eliquis   Past Medical History:  Diagnosis Date   COPD (chronic obstructive pulmonary disease) ([place])    History of chicken pox    HSV-1 infection    Hx of colonic polyps    Hypercholesterolemia    Hypothyroidism    Melanoma of thigh ([place]) 02/15/2011   Multiple myeloma ([place])    follow[facility] at [facility]   MVP (mitral valve prolapse)    Osteopenia    Prolapsed uterus    Raynaud disease    Varicose vein    Past Surgical History:  Procedure Laterality Date   BREAST SURGERY     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 Patient does not remember exact date of surgery.   CATARACT EXTRACTION Right 10/25/2017   CESAREAN SECTION  1965   MELANOMA EXCISION  12/31/2010   Procedure: MELANOMA EXCISION;  Surgeon: Zenovia Jarred, MD;  Location: [facility];  Service: General;  Laterality: Left;  wide excision melanoma left thigh, excision lesion left thigh   OTHER SURGICAL HISTORY Right    Surgical repair of architectual  distortion Right Breast- Hyperplasia   TONSILLECTOMY      Current Outpatient Medications  Medication Sig Dispense Refill   acyclovir ointment (ZOVIRAX) 5 % Apply topically every 3 (three) hours. Use as neede for cold sores 15 g 0   amLODipine (NORVASC) 5 MG tablet Take 1 tablet by mouth daily.     apixaban (ELIQUIS) 2.5 MG TABS tablet Take 1 tablet (2.5 mg total) by mouth 2 (two) times daily. 60 tablet 3   Calcium Carbonate-Vitamin D (CALCIUM + D PO) Take by mouth daily.      Cholecalciferol (VITAMIN D) 2000 UNITS CAPS Take 1 capsule by mouth daily.     Cranberry-Vitamin C-Probiotic (AZO CRANBERRY) 250-30 MG TABS See admin instructions.     dexamethasone (DECADRON) 4 MG tablet Take by mouth.     fluticasone (FLONASE) 50 MCG/ACT nasal spray PLACE 2 SPRAYS INTO BOTH NOSTRILS AS NEEDED. 16 g 5   gabapentin (NEURONTIN) 100 MG capsule Take 1 capsule (100 mg total) by mouth at bedtime.     hydrocortisone 2.5 % cream APPLY TOPICALLY 3 (THREE) TIMES DAILY. USE AS NEEDED. 28 g 3   levothyroxine (SYNTHROID, LEVOTHROID) 88 MCG tablet Take 1 tablet (88 mcg total) by mouth daily before breakfast. 30 tablet 11   LUTEIN PO Take 1 tablet by mouth daily.     metoprolol succinate (TOPROL-XL) 25 MG 24 hr tablet Take 25 mg by mouth daily.     Multiple Vitamin (MULTIVITAMIN PO) Take by mouth.     Multiple Vitamins-Minerals (PRESERVISION AREDS 2 PO) Take by mouth.     nicotine polacrilex (NICORETTE) 2 MG gum Take 2 mg by mouth as needed. Not smoking     NONFORMULARY OR COMPOUNDED ITEM Vaginal estradiol cream 0.02%.  1/2 gram per vagina at bedtime up to two times weekly.  Disp:  8 grams. 1 each 11   REVLIMID 5 MG capsule Take by mouth.     valACYclovir (VALTREX) 1000 MG tablet 2 tabs (2gm) x 1, repeat in 12 hours with symptom onset 30 tablet 2   No current facility-administered medications for this encounter.    Allergies  Allergen Reactions   Ciprofloxacin Other (See Comments)    Musculskeletal pain.   Codeine Nausea And Vomiting   Macrobid [Nitrofurantoin] Nausea Only and Other (See Comments)    Dizzy, headache,   Pholcodine Diarrhea   Sulfa Antibiotics Nausea Only    Social History   Socioeconomic History   Marital status: Widowed    Spouse name: fred Skiver   Number of children: Not on file   Years of education: Not on file   Highest education level: Not on file  Occupational History   Not on file  Tobacco Use   Smoking status: Former    Packs/day: 1.00    Years:  50.00    Total pack years: 50.00    Types: Cigarettes    Start date: 03/08/1960    Quit date: 07/23/2010    Years since quitting: 11.1   Smokeless tobacco: Never   Tobacco comments:    Former smoker 07/29/21  Vaping Use   Vaping Use: Never used  Substance and Sexual Activity   Alcohol use: No    Alcohol/week: 0.0 standard drinks of alcohol   Drug use: No   Sexual activity: Not Currently    Partners: Male    Birth control/protection: Post-menopausal  Other Topics Concern   Not on file  Social History Narrative   Not on file   Social Determinants  of Health   Financial Resource Strain: Not on file  Food Insecurity: Not on file  Transportation Needs: Not on file  Physical Activity: Not on file  Stress: Not on file  Social Connections: Not on file  Intimate Partner Violence: Not on file     ROS- All systems are reviewed and negative except as per the HPI above.  Physical Exam: Vitals:   09/03/21 1151  BP: 140/80  Pulse: 96  Weight: 50.4 kg  Height: '5\' 2"'  (1.575 m)     GEN- The patient is a well appearing elderly female, alert and oriented x 3 today.   HEENT-head normocephalic, atraumatic, sclera clear, conjunctiva pink, hearing intact, trachea midline. Lungs- Clear to ausculation bilaterally, normal work of breathing Heart- irregular rate and rhythm, no murmurs, rubs or gallops  GI- soft, NT, ND, + BS Extremities- no clubbing, cyanosis, or edema MS- no significant deformity or atrophy Skin- no rash or lesion Psych- euthymic mood, full affect Neuro- strength and sensation are intact   Wt Readings from Last 3 Encounters:  09/03/21 50.4 kg  08/07/21 49.8 kg  07/29/21 51.1 kg    EKG today demonstrates  Afib, PVC Vent. rate 96 BPM PR interval * ms QRS duration 70 ms QT/QTcB 338/427 ms  Echo 08/05/21 demonstrated   1. Left ventricular ejection fraction, by estimation, is 55 to 60%. The  left ventricle has normal function. The left ventricle has no regional   wall motion abnormalities. There is mild left ventricular hypertrophy.  Left ventricular diastolic parameters are indeterminate.   2. Right ventricular systolic function is mildly reduced. The right  ventricular size is normal. There is mildly elevated pulmonary artery  systolic pressure. The estimated right ventricular systolic pressure is  50.2 mmHg.   3. The mitral valve is degenerative. Mild mitral valve regurgitation.  Moderate mitral annular calcification.   4. The aortic valve is tricuspid. Aortic valve regurgitation is not  visualized. Aortic valve sclerosis/calcification is present, without any  evidence of aortic stenosis.   5. The inferior vena cava is normal in size with <50% respiratory  variability, suggesting right atrial pressure of 8 mmHg.   Epic records are reviewed at length today  CHA2DS2-VASc Score = 4  The patient's score is based upon: CHF History: 0 HTN History: 1 Diabetes History: 0 Stroke History: 0 Vascular Disease History: 0 Age Score: 2 Gender Score: 1       ASSESSMENT AND PLAN: 1. Persistent Atrial Fibrillation (ICD10:  I48.19) The patient's CHA2DS2-VASc score is 4, indicating a 4.8% annual risk of stroke.   Patient back in afib, unclear if she is paroxysmal or persistent at this point.  We discussed rate vs rhythm control. She is clear in her decision to avoid DCCV or anything invasive. She does not wish to consider AAD at this time. Will increase Toprol to 37.5 mg daily Continue Eliquis to 2.5 mg BID (age >80, weight < 60 kg)  2. Secondary Hypercoagulable State (ICD10:  D68.69) The patient is at significant risk for stroke/thromboembolism based upon her CHA2DS2-VASc Score of 4.  Continue Apixaban (Eliquis).   3. Multiple myeloma Followed by Dr Sabino Dick at [facility]  4. HTN Stable, med changes as above.    Will refer her to establish care with a primary cardiologist, her husband was previously treated by Dr Tamala Julian.    [facility] [address] [place], [place] [address] [phone] 09/03/2021 12:03 PM

## 2021-09-04 ENCOUNTER — Ambulatory Visit (HOSPITAL_BASED_OUTPATIENT_CLINIC_OR_DEPARTMENT_OTHER): Payer: Medicare PPO | Admitting: Obstetrics & Gynecology

## 2021-09-14 ENCOUNTER — Encounter (HOSPITAL_BASED_OUTPATIENT_CLINIC_OR_DEPARTMENT_OTHER): Payer: Self-pay | Admitting: Obstetrics & Gynecology

## 2021-09-14 ENCOUNTER — Ambulatory Visit (HOSPITAL_BASED_OUTPATIENT_CLINIC_OR_DEPARTMENT_OTHER): Payer: Medicare PPO | Admitting: Obstetrics & Gynecology

## 2021-09-14 VITALS — BP 113/68 | HR 82 | Wt 109.4 lb

## 2021-09-14 DIAGNOSIS — Z4689 Encounter for fitting and adjustment of other specified devices: Secondary | ICD-10-CM | POA: Diagnosis not present

## 2021-09-14 DIAGNOSIS — N814 Uterovaginal prolapse, unspecified: Secondary | ICD-10-CM

## 2021-09-14 DIAGNOSIS — Z466 Encounter for fitting and adjustment of urinary device: Secondary | ICD-10-CM

## 2021-09-14 DIAGNOSIS — N8110 Cystocele, unspecified: Secondary | ICD-10-CM

## 2021-09-14 NOTE — Progress Notes (Signed)
81 y.o. Widowed White female G1P1001 here for pessary check.  She reports no vaginal discharge or bleeding.  Feeling well.  She is not sexually active.    Patient has been using following pessary style and size:  #3 incontinence ring with support.  Allergies  Allergen Reactions   Ciprofloxacin Other (See Comments)    Musculskeletal pain.   Codeine Nausea And Vomiting   Macrobid [Nitrofurantoin] Nausea Only and Other (See Comments)    Dizzy, headache,   Pholcodine Diarrhea   Sulfa Antibiotics Nausea Only    ROS: Denies, dysuria, hematuria, urinary incontinence  Exam:   LMP 02/16/1988 (LMP Unknown)   General appearance: alert Inguinal lymph nodes:  not enlarged  Pelvic: External genitalia:  no lesions              Urethra: normal appearing urethra with no masses, tenderness or lesions              Bartholins and Skenes: Bartholin's, Urethra, Skene's normal                 Vagina: normal appearing vagina with normal color and discharge, vaginal erosion is fully resolved              Cervix: normal appearance   Pap obtained:  no Bimanual Exam:  Uterus:  uterus is normal size, shape, consistency and nontender                               Adnexa:    normal adnexa in size, nontender and no masses  Pessary was replaced. Patient tolerated procedure well.    Assessment/Plan: 1. Pessary maintenance - reheck 4-6 weeks.  Pt will call if has any new issues/concerns  2. Female cystocele  3. Uterine prolapse

## 2021-09-20 NOTE — Progress Notes (Signed)
Cardiology Office Note:    Date:  09/23/2021   ID:  Ariel Holmes, DOB 14-Nov-1940, MRN [id number]  PCP:  Lajean Manes, MD  Cardiologist:  Sinclair Grooms, MD   Referring MD: Oliver Barre, PA   Chief Complaint  Patient presents with   Atrial Fibrillation    History of Present Illness:    Ariel Holmes is a 81 y.o. female with a hx of COPD, hyperlipidemia, Multiple Myeloma, MVP, Raynauds disease, and persistent atrial fibrillation.  Has already been seen in AF clinic and rate control strategy decided based on shared decision as patient refused concept of DCCV and medication. On low dose Eliquis. They referred to a "Primary Cardiology" provider. I cared for her husband.   Does not want rhythm control.  Would not ever allow a cardioversion.  Does not want to be on additional medications.  Feels she is in and out of AF.  From a physical activity performance standpoint, she was shocked when Dr. Felipa Eth told her that she was in A-fib.  I did discuss long-term downside of persistent atrial fibrillation with development of mitral and tricuspid regurgitation and possible heart failure.  She states that at her age she would just have to deal with it.  Past Medical History:  Diagnosis Date   COPD (chronic obstructive pulmonary disease) ([place])    History of chicken pox    HSV-1 infection    Hx of colonic polyps    Hypercholesterolemia    Hypothyroidism    Melanoma of thigh ([place]) 02/15/2011   Multiple myeloma ([place])    follow[facility] at [facility]   MVP (mitral valve prolapse)    Osteopenia    Prolapsed uterus    Raynaud disease    Varicose vein     Past Surgical History:  Procedure Laterality Date   BREAST SURGERY     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 Patient does not remember exact date of surgery.   CATARACT EXTRACTION Right 10/25/2017   CESAREAN SECTION  1965   MELANOMA EXCISION  12/31/2010   Procedure: MELANOMA EXCISION;  Surgeon: Zenovia Jarred, MD;  Location: [facility];  Service: General;  Laterality: Left;  wide excision melanoma left thigh, excision lesion left thigh   OTHER SURGICAL HISTORY Right    Surgical repair of architectual distortion Right Breast- Hyperplasia   TONSILLECTOMY      Current Medications: Current Meds  Medication Sig   acyclovir ointment (ZOVIRAX) 5 % Apply topically every 3 (three) hours. Use as neede for cold sores   amLODipine (NORVASC) 5 MG tablet Take 1 tablet by mouth daily.   apixaban (ELIQUIS) 2.5 MG TABS tablet Take 1 tablet (2.5 mg total) by mouth 2 (two) times daily.   Calcium Carbonate-Vitamin D (CALCIUM + D PO) Take by mouth daily.   Cholecalciferol (VITAMIN D) 2000 UNITS CAPS Take 1 capsule by mouth daily.   dexamethasone (DECADRON) 4 MG tablet Take by mouth.   fluticasone (FLONASE) 50 MCG/ACT nasal spray PLACE 2 SPRAYS INTO BOTH NOSTRILS AS NEEDED.   hydrocortisone 2.5 % cream APPLY TOPICALLY 3 (THREE) TIMES DAILY. USE AS NEEDED.   ISATUXIMAB-IRFC IV Inject into the vein every 14 (fourteen) days.   levothyroxine (SYNTHROID, LEVOTHROID) 88 MCG tablet Take 1 tablet (88 mcg total) by mouth daily before breakfast.   LUTEIN PO Take 1 tablet by mouth daily.   metoprolol succinate (TOPROL XL) 25 MG 24 hr tablet Take 1 tablet (25 mg total) by mouth daily.  Multiple Vitamin (MULTIVITAMIN PO) Take by mouth.   Multiple Vitamins-Minerals (PRESERVISION AREDS 2 PO) Take by mouth.   nicotine polacrilex (NICORETTE) 2 MG gum Take 2 mg by mouth as needed. Not smoking   NONFORMULARY OR COMPOUNDED ITEM Vaginal estradiol cream 0.02%.  1/2 gram per vagina at bedtime up to two times weekly.  Disp:  8 grams.   REVLIMID 5 MG capsule Take by mouth.   valACYclovir (VALTREX) 1000 MG tablet 2 tabs (2gm) x 1, repeat in 12 hours with symptom onset   Zoledronic Acid (ZOMETA) 4 MG/100ML IVPB Inject 4 mg into the vein every 3 (three) months.   [DISCONTINUED] metoprolol succinate (TOPROL-XL) 25 MG 24 hr tablet Take 1.5 tablets (37.5  mg total) by mouth daily.     Allergies:   Ciprofloxacin, Codeine, Macrobid [nitrofurantoin], Pholcodine, and Sulfa antibiotics   Social History   Socioeconomic History   Marital status: Widowed    Spouse name: IT consultant   Number of children: Not on file   Years of education: Not on file   Highest education level: Not on file  Occupational History   Not on file  Tobacco Use   Smoking status: Former    Packs/day: 1.00    Years: 50.00    Total pack years: 50.00    Types: Cigarettes    Start date: 03/08/1960    Quit date: 07/23/2010    Years since quitting: 11.1   Smokeless tobacco: Never   Tobacco comments:    Former smoker 07/29/21  Vaping Use   Vaping Use: Never used  Substance and Sexual Activity   Alcohol use: No    Alcohol/week: 0.0 standard drinks of alcohol   Drug use: No   Sexual activity: Not Currently    Partners: Male    Birth control/protection: Post-menopausal  Other Topics Concern   Not on file  Social History Narrative   Not on file   Social Determinants of Health   Financial Resource Strain: Not on file  Food Insecurity: Not on file  Transportation Needs: Not on file  Physical Activity: Not on file  Stress: Not on file  Social Connections: Not on file     Family History: The patient's family history includes CVA in her maternal grandfather; Congestive Heart Failure in her mother; Diabetes in her paternal grandfather and paternal grandmother; Heart attack (age of onset: 22) in her father; Heart disease in her mother; Hyperlipidemia in her mother; Hypertension in her maternal grandfather and mother; Stroke in her father; Thyroid disease in her mother. There is no history of Esophageal cancer, Rectal cancer, Stomach cancer, or Colon cancer.  ROS:   Please see the history of present illness.    She is on chemotherapy for multiple myeloma.  All other systems reviewed and are negative.  EKGs/Labs/Other Studies Reviewed:    The following studies were  reviewed today:  ECHOCARDIOGRAM 2023: IMPRESSIONS   1. Left ventricular ejection fraction, by estimation, is 55 to 60%. The  left ventricle has normal function. The left ventricle has no regional  wall motion abnormalities. There is mild left ventricular hypertrophy.  Left ventricular diastolic parameters  are indeterminate.   2. Right ventricular systolic function is mildly reduced. The right  ventricular size is normal. There is mildly elevated pulmonary artery  systolic pressure. The estimated right ventricular systolic pressure is  22.0 mmHg.   3. The mitral valve is degenerative. Mild mitral valve regurgitation.  Moderate mitral annular calcification.   4. The aortic valve is  tricuspid. Aortic valve regurgitation is not  visualized. Aortic valve sclerosis/calcification is present, without any  evidence of aortic stenosis.   5. The inferior vena cava is normal in size with <50% respiratory  variability, suggesting right atrial pressure of 8 mmHg.   EKG:  EKG EKG today demonstrates  Recent Labs: 02/18/2021: ALT 14 09/03/2021: BUN 18; Creatinine, Ser 0.77; Hemoglobin 12.3; Platelets 169; Potassium 4.7; Sodium 139  Recent Lipid Panel    Component Value Date/Time   CHOL 199 06/14/2017 1100   TRIG 83.0 06/14/2017 1100   HDL 78.30 06/14/2017 1100   CHOLHDL 3 06/14/2017 1100   VLDL 16.6 06/14/2017 1100   LDLCALC 104 (H) 06/14/2017 1100   LDLDIRECT 105.0 04/19/2013 1034    Physical Exam:    VS:  BP 108/62   Pulse (!) 57   Ht 5' 2" (1.575 m)   Wt 110 lb 12.8 oz (50.3 kg)   LMP 02/16/1988 (LMP Unknown)   SpO2 98%   BMI 20.27 kg/m     Wt Readings from Last 3 Encounters:  09/23/21 110 lb 12.8 oz (50.3 kg)  09/14/21 109 lb 6.4 oz (49.6 kg)  09/03/21 111 lb 3.2 oz (50.4 kg)     GEN: Slender, compatible with age. No acute distress HEENT: Normal NECK: No JVD. LYMPHATICS: No lymphadenopathy CARDIAC: Right upper sternal systolic murmur.  This is likely the result of the  sclerotic aortic valve.  Also soft apical systolic murmur is heard.  Irregularly irregular RR no gallop, or edema. VASCULAR:  normal Pulses. No bruits. RESPIRATORY:  Clear to auscultation without rales, wheezing or rhonchi  ABDOMEN: Soft, non-tender, non-distended, No pulsatile mass, MUSCULOSKELETAL: No deformity  SKIN: Warm and dry NEUROLOGIC:  Alert and oriented x 3 PSYCHIATRIC:  Normal affect   ASSESSMENT:    1. Persistent atrial fibrillation ([place])   2. Secondary hypercoagulable state ([place])   3. Right carotid bruit   4. Pulmonary emphysema, unspecified emphysema type ([place])    PLAN:    In order of problems listed above:  She is in permanent atrial fibrillation, assumed.  Through shared decision making, no attempt at rhythm control will be made.  We discussed treatment options including the downstream impact of continuous atrial fibrillation on heart function and potential development of heart failure.  She is on adequate anticoagulation given her weight and age along with kidney function.  We will continue to use rate control.  She will follow-up in 1 year. Continue Eliquis at the current dose. Did not discuss Did not discuss   On follow-up she will be assigned a new cardiologist.   Medication Adjustments/Labs and Tests Ordered: Current medicines are reviewed at length with the patient today.  Concerns regarding medicines are outlined above.  No orders of the defined types were placed in this encounter.  Meds ordered this encounter  Medications   metoprolol succinate (TOPROL XL) 25 MG 24 hr tablet    Sig: Take 1 tablet (25 mg total) by mouth daily.    Patient Instructions  Medication Instructions:  Your physician has recommended you make the following change in your medication:   1) CHANGE Metoprolol succinate (Toprol XL) to 71m daily  *If you need a refill on your cardiac medications before your next appointment, please call your pharmacy*  Lab  Work: NONE  Testing/Procedures: NONE  Follow-Up: At [facility] you and your health needs are our priority.  As part of our continuing mission to provide you with exceptional heart care, we have created  designated Provider Care Teams.  These Care Teams include your primary Cardiologist (physician) and Advanced Practice Providers (APPs -  Physician Assistants and Nurse Practitioners) who all work together to provide you with the care you need, when you need it.  Your next appointment:   1 year(s)  The format for your next appointment:   In Person  Provider:   Sinclair Grooms, MD {   Important Information About Sugar         Signed, Sinclair Grooms, MD  09/23/2021 3:36 PM    [place]

## 2021-09-23 ENCOUNTER — Ambulatory Visit: Payer: Medicare PPO | Admitting: Interventional Cardiology

## 2021-09-23 ENCOUNTER — Encounter: Payer: Self-pay | Admitting: Interventional Cardiology

## 2021-09-23 VITALS — BP 108/62 | HR 57 | Ht 62.0 in | Wt 110.8 lb

## 2021-09-23 DIAGNOSIS — R0989 Other specified symptoms and signs involving the circulatory and respiratory systems: Secondary | ICD-10-CM

## 2021-09-23 DIAGNOSIS — I4819 Other persistent atrial fibrillation: Secondary | ICD-10-CM

## 2021-09-23 DIAGNOSIS — J439 Emphysema, unspecified: Secondary | ICD-10-CM

## 2021-09-23 DIAGNOSIS — D6869 Other thrombophilia: Secondary | ICD-10-CM | POA: Diagnosis not present

## 2021-09-23 MED ORDER — METOPROLOL SUCCINATE ER 25 MG PO TB24
25.0000 mg | ORAL_TABLET | Freq: Every day | ORAL | Status: DC
Start: 2021-09-23 — End: 2022-01-14

## 2021-09-23 NOTE — Patient Instructions (Signed)
Medication Instructions:  Your physician has recommended you make the following change in your medication:   1) CHANGE Metoprolol succinate (Toprol XL) to '25mg'$  daily  *If you need a refill on your cardiac medications before your next appointment, please call your pharmacy*  Lab Work: NONE  Testing/Procedures: NONE  Follow-Up: At [facility], you and your health needs are our priority.  As part of our continuing mission to provide you with exceptional heart care, we have created designated Provider Care Teams.  These Care Teams include your primary Cardiologist (physician) and Advanced Practice Providers (APPs -  Physician Assistants and Nurse Practitioners) who all work together to provide you with the care you need, when you need it.  Your next appointment:   1 year(s)  The format for your next appointment:   In Person  Provider:   Sinclair Grooms, MD {   Important Information About Sugar

## 2021-10-05 DIAGNOSIS — C9000 Multiple myeloma not having achieved remission: Secondary | ICD-10-CM | POA: Diagnosis not present

## 2021-10-05 DIAGNOSIS — Z882 Allergy status to sulfonamides status: Secondary | ICD-10-CM | POA: Diagnosis not present

## 2021-10-05 DIAGNOSIS — K5900 Constipation, unspecified: Secondary | ICD-10-CM | POA: Diagnosis not present

## 2021-10-05 DIAGNOSIS — M25559 Pain in unspecified hip: Secondary | ICD-10-CM | POA: Diagnosis not present

## 2021-10-05 DIAGNOSIS — R5383 Other fatigue: Secondary | ICD-10-CM | POA: Diagnosis not present

## 2021-10-05 DIAGNOSIS — J449 Chronic obstructive pulmonary disease, unspecified: Secondary | ICD-10-CM | POA: Diagnosis not present

## 2021-10-05 DIAGNOSIS — Z885 Allergy status to narcotic agent status: Secondary | ICD-10-CM | POA: Diagnosis not present

## 2021-10-05 DIAGNOSIS — I480 Paroxysmal atrial fibrillation: Secondary | ICD-10-CM | POA: Diagnosis not present

## 2021-10-07 DIAGNOSIS — L570 Actinic keratosis: Secondary | ICD-10-CM | POA: Diagnosis not present

## 2021-10-07 DIAGNOSIS — L682 Localized hypertrichosis: Secondary | ICD-10-CM | POA: Diagnosis not present

## 2021-10-07 DIAGNOSIS — Z85820 Personal history of malignant melanoma of skin: Secondary | ICD-10-CM | POA: Diagnosis not present

## 2021-10-30 ENCOUNTER — Ambulatory Visit (HOSPITAL_BASED_OUTPATIENT_CLINIC_OR_DEPARTMENT_OTHER): Payer: Medicare PPO | Admitting: Obstetrics & Gynecology

## 2021-10-30 ENCOUNTER — Encounter (HOSPITAL_BASED_OUTPATIENT_CLINIC_OR_DEPARTMENT_OTHER): Payer: Self-pay | Admitting: Obstetrics & Gynecology

## 2021-10-30 VITALS — BP 101/61 | HR 69 | Ht 62.0 in | Wt 110.6 lb

## 2021-10-30 DIAGNOSIS — N814 Uterovaginal prolapse, unspecified: Secondary | ICD-10-CM | POA: Diagnosis not present

## 2021-10-30 DIAGNOSIS — N8110 Cystocele, unspecified: Secondary | ICD-10-CM

## 2021-10-30 DIAGNOSIS — Z960 Presence of urogenital implants: Secondary | ICD-10-CM | POA: Diagnosis not present

## 2021-10-30 NOTE — Progress Notes (Signed)
81 y.o. Widowed White female G1P1001 here for pessary check.  She reports some vaginal discharge.  Denies bleeding.  She is not sexually active.    Patient has been using following pessary style and size:  #3 incontinence ring with support.  Allergies  Allergen Reactions   Ciprofloxacin Other (See Comments)    Musculskeletal pain.   Codeine Nausea And Vomiting   Macrobid [Nitrofurantoin] Nausea Only and Other (See Comments)    Dizzy, headache,   Pholcodine Diarrhea   Sulfa Antibiotics Nausea Only    ROS: Denies: dysuria  Exam:   BP 101/61 (BP Location: Left Arm, Patient Position: Sitting, Cuff Size: Normal)   Pulse 69   Ht '5\' 2"'$  (1.575 m) Comment: Reported  Wt 110 lb 9.6 oz (50.2 kg)   LMP 02/16/1988 (LMP Unknown)   BMI 20.23 kg/m   General appearance: alert Inguinal lymph nodes:  not enlarged  Pelvic: External genitalia:  no lesions              Urethra: normal appearing urethra with no masses, tenderness or lesions              Bartholins and Skenes: Bartholin's, Urethra, Skene's normal                 Vagina: normal appearing vagina with normal color and discharge, no lesions, pressure area on posterior vagina noted              Cervix: normal appearance   Pap obtained:  no   Pessary was removed without difficulty.  Pessary was cleansed.  Pessary was not replaced. Will leave out for the next month.  Assessment/Plan: 1. Presence of pessary - pessary was left out and she will return for replacement in about a month  2. Female cystocele  3. Uterine prolapse

## 2021-11-20 ENCOUNTER — Other Ambulatory Visit (HOSPITAL_COMMUNITY): Payer: Self-pay | Admitting: Physician Assistant

## 2021-11-28 DIAGNOSIS — Z23 Encounter for immunization: Secondary | ICD-10-CM | POA: Diagnosis not present

## 2021-12-01 DIAGNOSIS — J449 Chronic obstructive pulmonary disease, unspecified: Secondary | ICD-10-CM | POA: Diagnosis not present

## 2021-12-01 DIAGNOSIS — I480 Paroxysmal atrial fibrillation: Secondary | ICD-10-CM | POA: Diagnosis not present

## 2021-12-01 DIAGNOSIS — M25559 Pain in unspecified hip: Secondary | ICD-10-CM | POA: Diagnosis not present

## 2021-12-01 DIAGNOSIS — L570 Actinic keratosis: Secondary | ICD-10-CM | POA: Diagnosis not present

## 2021-12-01 DIAGNOSIS — K5900 Constipation, unspecified: Secondary | ICD-10-CM | POA: Diagnosis not present

## 2021-12-01 DIAGNOSIS — Z5112 Encounter for antineoplastic immunotherapy: Secondary | ICD-10-CM | POA: Diagnosis not present

## 2021-12-01 DIAGNOSIS — C9000 Multiple myeloma not having achieved remission: Secondary | ICD-10-CM | POA: Diagnosis not present

## 2021-12-01 DIAGNOSIS — R5383 Other fatigue: Secondary | ICD-10-CM | POA: Diagnosis not present

## 2021-12-07 DIAGNOSIS — Z23 Encounter for immunization: Secondary | ICD-10-CM | POA: Diagnosis not present

## 2021-12-16 ENCOUNTER — Ambulatory Visit (HOSPITAL_BASED_OUTPATIENT_CLINIC_OR_DEPARTMENT_OTHER): Payer: Medicare PPO | Admitting: Obstetrics & Gynecology

## 2021-12-16 ENCOUNTER — Encounter (HOSPITAL_BASED_OUTPATIENT_CLINIC_OR_DEPARTMENT_OTHER): Payer: Self-pay | Admitting: Obstetrics & Gynecology

## 2021-12-16 VITALS — BP 129/77 | HR 80 | Ht 62.0 in | Wt 110.2 lb

## 2021-12-16 DIAGNOSIS — Z4689 Encounter for fitting and adjustment of other specified devices: Secondary | ICD-10-CM

## 2021-12-16 DIAGNOSIS — N8110 Cystocele, unspecified: Secondary | ICD-10-CM

## 2021-12-19 NOTE — Progress Notes (Signed)
81 y.o. Widowed White female G1P1001 here for pessary insertion.  Denies any vaginal bleeding or discharge.  Does get pressure area in posterior vagina and needs to leave pessary out which is what she did this past month.  She is not sexually active. .  Allergies  Allergen Reactions   Ciprofloxacin Other (See Comments)    Musculskeletal pain.   Codeine Nausea And Vomiting   Macrobid [Nitrofurantoin] Nausea Only and Other (See Comments)    Dizzy, headache,   Pholcodine Diarrhea   Sulfa Antibiotics Nausea Only    ROS: Denies, dysuria, pelvic pain  Exam:   BP 129/77 (BP Location: Right Arm, Patient Position: Sitting, Cuff Size: Normal)   Pulse 80   Ht '5\' 2"'$  (1.575 m) Comment: Reported  Wt 110 lb 3.2 oz (50 kg)   LMP 02/16/1988 (LMP Unknown)   BMI 20.16 kg/m   General appearance:  Inguinal lymph nodes:  not enlarged  Pelvic: External genitalia:  no lesions              Urethra: normal appearing urethra with no masses, tenderness or lesions              Bartholins and Skenes: Bartholin's, Urethra, Skene's normal                 Vagina: normal appearing vagina with normal color and discharge, no lesions              Cervix: normal appearance   Pap obtained:  no  Pessary easily replaced and without difficulty.    Assessment/Plan: 1. Female cystocele - pessary replaced today.  Recheck 4-6 weeks.  2. Pessary maintenance

## 2022-01-01 ENCOUNTER — Ambulatory Visit (HOSPITAL_BASED_OUTPATIENT_CLINIC_OR_DEPARTMENT_OTHER): Payer: Medicare PPO | Admitting: Obstetrics & Gynecology

## 2022-01-01 DIAGNOSIS — H40053 Ocular hypertension, bilateral: Secondary | ICD-10-CM | POA: Diagnosis not present

## 2022-01-01 DIAGNOSIS — Z961 Presence of intraocular lens: Secondary | ICD-10-CM | POA: Diagnosis not present

## 2022-01-13 DIAGNOSIS — Z0000 Encounter for general adult medical examination without abnormal findings: Secondary | ICD-10-CM | POA: Diagnosis not present

## 2022-01-13 DIAGNOSIS — Z1331 Encounter for screening for depression: Secondary | ICD-10-CM | POA: Diagnosis not present

## 2022-01-13 DIAGNOSIS — Z79899 Other long term (current) drug therapy: Secondary | ICD-10-CM | POA: Diagnosis not present

## 2022-01-13 DIAGNOSIS — C9000 Multiple myeloma not having achieved remission: Secondary | ICD-10-CM | POA: Diagnosis not present

## 2022-01-13 DIAGNOSIS — E039 Hypothyroidism, unspecified: Secondary | ICD-10-CM | POA: Diagnosis not present

## 2022-01-13 DIAGNOSIS — I10 Essential (primary) hypertension: Secondary | ICD-10-CM | POA: Diagnosis not present

## 2022-01-13 DIAGNOSIS — I480 Paroxysmal atrial fibrillation: Secondary | ICD-10-CM | POA: Diagnosis not present

## 2022-01-13 DIAGNOSIS — K219 Gastro-esophageal reflux disease without esophagitis: Secondary | ICD-10-CM | POA: Diagnosis not present

## 2022-01-13 DIAGNOSIS — M8580 Other specified disorders of bone density and structure, unspecified site: Secondary | ICD-10-CM | POA: Diagnosis not present

## 2022-01-14 ENCOUNTER — Other Ambulatory Visit (HOSPITAL_COMMUNITY): Payer: Self-pay | Admitting: Physician Assistant

## 2022-01-18 DIAGNOSIS — M85852 Other specified disorders of bone density and structure, left thigh: Secondary | ICD-10-CM | POA: Diagnosis not present

## 2022-01-18 DIAGNOSIS — M85851 Other specified disorders of bone density and structure, right thigh: Secondary | ICD-10-CM | POA: Diagnosis not present

## 2022-01-18 DIAGNOSIS — Z780 Asymptomatic menopausal state: Secondary | ICD-10-CM | POA: Diagnosis not present

## 2022-01-22 DIAGNOSIS — L821 Other seborrheic keratosis: Secondary | ICD-10-CM | POA: Diagnosis not present

## 2022-01-22 DIAGNOSIS — D692 Other nonthrombocytopenic purpura: Secondary | ICD-10-CM | POA: Diagnosis not present

## 2022-01-22 DIAGNOSIS — D225 Melanocytic nevi of trunk: Secondary | ICD-10-CM | POA: Diagnosis not present

## 2022-01-22 DIAGNOSIS — L570 Actinic keratosis: Secondary | ICD-10-CM | POA: Diagnosis not present

## 2022-01-22 DIAGNOSIS — D045 Carcinoma in situ of skin of trunk: Secondary | ICD-10-CM | POA: Diagnosis not present

## 2022-01-22 DIAGNOSIS — D2272 Melanocytic nevi of left lower limb, including hip: Secondary | ICD-10-CM | POA: Diagnosis not present

## 2022-01-22 DIAGNOSIS — L814 Other melanin hyperpigmentation: Secondary | ICD-10-CM | POA: Diagnosis not present

## 2022-01-22 DIAGNOSIS — Z85820 Personal history of malignant melanoma of skin: Secondary | ICD-10-CM | POA: Diagnosis not present

## 2022-01-26 DIAGNOSIS — D801 Nonfamilial hypogammaglobulinemia: Secondary | ICD-10-CM | POA: Diagnosis not present

## 2022-01-26 DIAGNOSIS — M545 Low back pain, unspecified: Secondary | ICD-10-CM | POA: Diagnosis not present

## 2022-01-26 DIAGNOSIS — R530 Neoplastic (malignant) related fatigue: Secondary | ICD-10-CM | POA: Diagnosis not present

## 2022-01-26 DIAGNOSIS — C9000 Multiple myeloma not having achieved remission: Secondary | ICD-10-CM | POA: Diagnosis not present

## 2022-01-26 DIAGNOSIS — J449 Chronic obstructive pulmonary disease, unspecified: Secondary | ICD-10-CM | POA: Diagnosis not present

## 2022-01-26 DIAGNOSIS — Z5112 Encounter for antineoplastic immunotherapy: Secondary | ICD-10-CM | POA: Diagnosis not present

## 2022-01-26 DIAGNOSIS — I480 Paroxysmal atrial fibrillation: Secondary | ICD-10-CM | POA: Diagnosis not present

## 2022-01-26 DIAGNOSIS — M25559 Pain in unspecified hip: Secondary | ICD-10-CM | POA: Diagnosis not present

## 2022-01-26 DIAGNOSIS — K5903 Drug induced constipation: Secondary | ICD-10-CM | POA: Diagnosis not present

## 2022-02-04 ENCOUNTER — Encounter (HOSPITAL_BASED_OUTPATIENT_CLINIC_OR_DEPARTMENT_OTHER): Payer: Self-pay | Admitting: Obstetrics & Gynecology

## 2022-02-04 ENCOUNTER — Ambulatory Visit (HOSPITAL_BASED_OUTPATIENT_CLINIC_OR_DEPARTMENT_OTHER): Payer: Medicare PPO | Admitting: Obstetrics & Gynecology

## 2022-02-04 VITALS — BP 110/71 | HR 68 | Ht 62.0 in | Wt 112.0 lb

## 2022-02-04 DIAGNOSIS — N814 Uterovaginal prolapse, unspecified: Secondary | ICD-10-CM | POA: Diagnosis not present

## 2022-02-04 DIAGNOSIS — N8110 Cystocele, unspecified: Secondary | ICD-10-CM

## 2022-02-04 DIAGNOSIS — N898 Other specified noninflammatory disorders of vagina: Secondary | ICD-10-CM | POA: Diagnosis not present

## 2022-02-04 DIAGNOSIS — Z960 Presence of urogenital implants: Secondary | ICD-10-CM

## 2022-02-04 NOTE — Progress Notes (Signed)
81 y.o. Widowed White female G1P1001 here for pessary check.  She reports she is having some vaginal discharge.  Denies vaginal bleeding.  She is not sexually active.     Allergies  Allergen Reactions   Ciprofloxacin Other (See Comments)    Musculskeletal pain.   Codeine Nausea And Vomiting   Macrobid [Nitrofurantoin] Nausea Only and Other (See Comments)    Dizzy, headache,   Pholcodine Diarrhea   Sulfa Antibiotics Nausea Only    ROS: No VB, +vaginal discharge  Exam:   BP 110/71 (BP Location: Right Arm, Patient Position: Sitting, Cuff Size: Normal)   Pulse 68   Ht '5\' 2"'$  (1.575 m) Comment: reported  Wt 112 lb (50.8 kg)   LMP 02/16/1988 (LMP Unknown)   BMI 20.49 kg/m   General appearance: alert and no distress Inguinal lymph nodes:  not enlarged  Pelvic: External genitalia:  no lesions              Urethra: normal appearing urethra with no masses, tenderness or lesions              Bartholins and Skenes: Bartholin's, Urethra, Skene's normal                 Vagina: normal appearing vagina with normal color, some mild discharge is noted, superficial erosion of vaginal mucosa in posterior fornix noted              Cervix: normal appearance   Pap obtained:  no  Pessary was removed without difficulty.  Pessary was cleansed.  Pessary was not replaced. Patient tolerated procedure well.  She will return for replacement when discharge is resolved.  Assessment/Plan: 1. Presence of pessary - removed pessary today.  She will return for replacement in 2-4 weeks  2. Female cystocele  3. Uterine prolapse  4. Vaginal erosion

## 2022-02-05 ENCOUNTER — Ambulatory Visit (HOSPITAL_BASED_OUTPATIENT_CLINIC_OR_DEPARTMENT_OTHER): Payer: Medicare PPO | Admitting: Obstetrics & Gynecology

## 2022-02-10 DIAGNOSIS — Z5111 Encounter for antineoplastic chemotherapy: Secondary | ICD-10-CM | POA: Diagnosis not present

## 2022-02-10 DIAGNOSIS — C9000 Multiple myeloma not having achieved remission: Secondary | ICD-10-CM | POA: Diagnosis not present

## 2022-03-11 ENCOUNTER — Telehealth: Payer: Self-pay | Admitting: Interventional Cardiology

## 2022-03-11 NOTE — Telephone Encounter (Signed)
Caryl Pina, nurse navigator, states Dr. Sabino Dick (oncologist) would like to have her office notes reviewed. She states patient recently had a PET scan and it showed calcification of coronary artery. Should be able to view on Care Everywhere. Patient has follow up scheduled for 2/01 with Nicholes Rough.

## 2022-03-12 NOTE — Telephone Encounter (Signed)
Left message for Ariel Holmes with Dr. Beacher May office stating we will send note to provider to review PET scan prior to appt on 03/18/2022.  Provided office number for callback if any questions.

## 2022-03-15 ENCOUNTER — Ambulatory Visit (HOSPITAL_BASED_OUTPATIENT_CLINIC_OR_DEPARTMENT_OTHER): Payer: Medicare PPO | Admitting: Obstetrics & Gynecology

## 2022-03-15 ENCOUNTER — Encounter (HOSPITAL_BASED_OUTPATIENT_CLINIC_OR_DEPARTMENT_OTHER): Payer: Self-pay | Admitting: Obstetrics & Gynecology

## 2022-03-15 VITALS — BP 123/68 | HR 48 | Ht 62.0 in | Wt 108.8 lb

## 2022-03-15 DIAGNOSIS — N814 Uterovaginal prolapse, unspecified: Secondary | ICD-10-CM | POA: Diagnosis not present

## 2022-03-15 DIAGNOSIS — N8110 Cystocele, unspecified: Secondary | ICD-10-CM

## 2022-03-15 DIAGNOSIS — Z4689 Encounter for fitting and adjustment of other specified devices: Secondary | ICD-10-CM | POA: Diagnosis not present

## 2022-03-15 NOTE — Progress Notes (Signed)
82 y.o. Widowed White female G1P1001 here for pessary check.  She reports no complaints.  Feels she is emptying her bladder well.  She is not sexually active.    Patient has been using following pessary style and size:  #3 incontinence ring with support.  She reports she recently had a PET scan showing "something in the heart".  Would like my thoughts.  Reviewed PET scan.  Heavy atherosclerosis in coronary vessels noted.  Dr. Tamala Julian, cardiologist, retired.    Allergies  Allergen Reactions   Ciprofloxacin Other (See Comments)    Musculskeletal pain.   Codeine Nausea And Vomiting   Macrobid [Nitrofurantoin] Nausea Only and Other (See Comments)    Dizzy, headache,   Pholcodine Diarrhea   Sulfa Antibiotics Nausea Only    ROS: No vaginal discharge or bleeding  Exam:   BP 123/68 (BP Location: Left Arm, Patient Position: Sitting, Cuff Size: Normal)   Pulse (!) 48   Ht '5\' 2"'$  (1.575 m) Comment: Reported  Wt 108 lb 12.8 oz (49.4 kg)   LMP 02/16/1988 (LMP Unknown)   BMI 19.90 kg/m   General appearance: alert and no distress Inguinal lymph nodes:  not enlarged  Pelvic: External genitalia:  no lesions              Urethra: normal appearing urethra with no masses, tenderness or lesions              Bartholins and Skenes: Bartholin's, Urethra, Skene's normal                 Vagina: normal appearing vagina with normal color and discharge, no lesions              Cervix: normal appearance   Pap obtained:  no Bimanual Exam:  Uterus:  uterus is normal size, shape, consistency and nontender                               Adnexa:    normal adnexa in size, nontender and no masses  Pessary was replaced. Patient tolerated procedure well.    Assessment/Plan: 1. Uterine prolapse - pessary placed today.  Pt will recheck in 4-6 weeks.  2. Female cystocele  3. Pessary maintenance

## 2022-03-17 NOTE — Progress Notes (Unsigned)
Office Visit    Patient Name: Ariel Holmes Date of Encounter: 03/18/2022  PCP:  Lajean Manes, MD   [place]  Cardiologist:  Candee Furbish, MD  Advanced Practice Provider:  No care team member to display Electrophysiologist:  None   HPI    Ariel Holmes is a 82 y.o. female with a past medical history significant for COPD, hyperlipidemia, multiple myeloma, MVP, Raynaud's disease, and persistent atrial fibrillation presents today for follow-up appointment.  She had been seen by the AF clinic and rate control strategy was decided based on shared decision as the patient refused the concept of DCCV and medication.  On low-dose Eliquis.  The patient does not want rhythm control and would not ever allow a cardioversion.  She does not want to be on additional medications.  She feels she is in and out of atrial fibrillation.  From a physical activity performance standpoint she was shocked when Dr. Felipa Eth told her that she was in A-fib.  Had an appointment on 09/23/2021 with Dr. Tamala Julian and he discussed the long-term downside of persistent atrial fibrillation with development of mitral and tricuspid regurgitation and possible heart failure.  She stated that at her age she would just have to deal with that.  A PET scan was ordered by Dr. Felipa Eth and showed "Heavy coronary artery atherosclerotic calcification". She also is concerned about " moderate calcified atherosclerosis of the abdominal aorta and its branching vessels" She then scheduled an appointment to come in and discuss.   Today, she tells me that she is being seen at [facility] comprehensive cancer center.  She walks 2 to 4 miles most days.  She had an episode of atrial fibrillation when she was seeing her primary Dr. Felipa Eth and she has been seen by the atrial fibrillation clinic.  She is not interested in DCCV.  She got her Eliquis dose changed to 2.5 twice daily which is appropriate for her weight and age.  She  lives by herself and takes care of of her home.  She also remains very active participating in an exercise program over at [facility].  She does have a strong cardiac history in her family as well as a strong stroke history.  Her father had a heart attack at 62 years old.  She was concerned today regarding her most recent PET scan which showed heavy coronary artery atherosclerotic calcification as well as moderate calcified atherosclerosis of the abdominal aorta and its branching vessels.  Some of this could certainly be due to age. We discussed driving her LDL down below 70 now that she has this diagnosis.  She tells me she is getting labs drawn on Monday through the cancer center and we have encouraged them to add on a lipid panel and send it our way.  At this time it does not appear that she is on any lipid-lowering medication.  Once a week or so she feels her atrial fibrillation but is not concerning to her.  Reports no shortness of breath nor dyspnea on exertion. Reports no chest pain, pressure, or tightness. No edema, orthopnea, PND.   Past Medical History    Past Medical History:  Diagnosis Date   COPD (chronic obstructive pulmonary disease) ([place])    History of chicken pox    HSV-1 infection    Hx of colonic polyps    Hypercholesterolemia    Hypothyroidism    Melanoma of thigh ([place]) 02/15/2011   Multiple myeloma ([place])  followed at [facility]   MVP (mitral valve prolapse)    Osteopenia    Prolapsed uterus    Raynaud disease    Varicose vein    Past Surgical History:  Procedure Laterality Date   BREAST SURGERY     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 Patient does not remember exact date of surgery.   CATARACT EXTRACTION Right 10/25/2017   CESAREAN SECTION  1965   MELANOMA EXCISION  12/31/2010   Procedure: MELANOMA EXCISION;  Surgeon: Zenovia Jarred, MD;  Location: [place];  Service: General;  Laterality: Left;  wide excision melanoma left thigh, excision lesion  left thigh   OTHER SURGICAL HISTORY Right    Surgical repair of architectual distortion Right Breast- Hyperplasia   TONSILLECTOMY      Allergies  Allergies  Allergen Reactions   Ciprofloxacin Other (See Comments)    Musculskeletal pain.   Codeine Nausea And Vomiting   Macrobid [Nitrofurantoin] Nausea Only and Other (See Comments)    Dizzy, headache,   Pholcodine Diarrhea   Sulfa Antibiotics Nausea Only   EKGs/Labs/Other Studies Reviewed:   The following studies were reviewed today:   ECHOCARDIOGRAM 2023: IMPRESSIONS   1. Left ventricular ejection fraction, by estimation, is 55 to 60%. The  left ventricle has normal function. The left ventricle has no regional  wall motion abnormalities. There is mild left ventricular hypertrophy.  Left ventricular diastolic parameters  are indeterminate.   2. Right ventricular systolic function is mildly reduced. The right  ventricular size is normal. There is mildly elevated pulmonary artery  systolic pressure. The estimated right ventricular systolic pressure is  25.3 mmHg.   3. The mitral valve is degenerative. Mild mitral valve regurgitation.  Moderate mitral annular calcification.   4. The aortic valve is tricuspid. Aortic valve regurgitation is not  visualized. Aortic valve sclerosis/calcification is present, without any  evidence of aortic stenosis.   5. The inferior vena cava is normal in size with <50% respiratory  variability, suggesting right atrial pressure of 8 mmHg.   EKG:  EKG is not ordered today.    Recent Labs: 09/03/2021: BUN 18; Creatinine, Ser 0.77; Hemoglobin 12.3; Platelets 169; Potassium 4.7; Sodium 139  Recent Lipid Panel    Component Value Date/Time   CHOL 199 06/14/2017 1100   TRIG 83.0 06/14/2017 1100   HDL 78.30 06/14/2017 1100   CHOLHDL 3 06/14/2017 1100   VLDL 16.6 06/14/2017 1100   LDLCALC 104 (H) 06/14/2017 1100   LDLDIRECT 105.0 04/19/2013 1034    Risk Assessment/Calculations:   CHA2DS2-VASc  Score = 4   This indicates a 4.8% annual risk of stroke. The patient's score is based upon: CHF History: 0 HTN History: 1 Diabetes History: 0 Stroke History: 0 Vascular Disease History: 0 Age Score: 2 Gender Score: 1     Home Medications   Current Meds  Medication Sig   acyclovir ointment (ZOVIRAX) 5 % Apply topically every 3 (three) hours. Use as neede for cold sores   amLODipine (NORVASC) 5 MG tablet Take 1 tablet by mouth daily.   Calcium Carbonate-Vitamin D (CALCIUM + D PO) Take by mouth daily.   Cholecalciferol (VITAMIN D) 2000 UNITS CAPS Take 1 capsule by mouth daily.   ELIQUIS 2.5 MG TABS tablet TAKE 1 TABLET BY MOUTH TWICE A DAY   fluticasone (FLONASE) 50 MCG/ACT nasal spray PLACE 2 SPRAYS INTO BOTH NOSTRILS AS NEEDED.   hydrocortisone 2.5 % cream APPLY TOPICALLY 3 (THREE) TIMES DAILY. USE AS NEEDED.  ISATUXIMAB-IRFC IV Inject into the vein every 14 (fourteen) days.   levothyroxine (SYNTHROID, LEVOTHROID) 88 MCG tablet Take 1 tablet (88 mcg total) by mouth daily before breakfast.   LUTEIN PO Take 1 tablet by mouth daily.   metoprolol succinate (TOPROL-XL) 25 MG 24 hr tablet TAKE 1.5 TABLETS BY MOUTH DAILY.   Multiple Vitamin (MULTIVITAMIN PO) Take by mouth.   Multiple Vitamins-Minerals (PRESERVISION AREDS 2 PO) Take by mouth.   nicotine polacrilex (NICORETTE) 2 MG gum Take 2 mg by mouth as needed. Not smoking   NONFORMULARY OR COMPOUNDED ITEM Vaginal estradiol cream 0.02%.  1/2 gram per vagina at bedtime up to two times weekly.  Disp:  8 grams.   REVLIMID 5 MG capsule Take by mouth.   valACYclovir (VALTREX) 1000 MG tablet 2 tabs (2gm) x 1, repeat in 12 hours with symptom onset   Zoledronic Acid (ZOMETA) 4 MG/100ML IVPB Inject 4 mg into the vein every 3 (three) months.     Review of Systems      All other systems reviewed and are otherwise negative except as noted above.  Physical Exam    VS:  BP 122/72   Pulse 72   Ht '5\' 2"'$  (1.575 m)   Wt 111 lb 6.4 oz (50.5 kg)    LMP 02/16/1988 (LMP Unknown)   SpO2 96%   BMI 20.38 kg/m  , BMI Body mass index is 20.38 kg/m.  Wt Readings from Last 3 Encounters:  03/18/22 111 lb 6.4 oz (50.5 kg)  03/15/22 108 lb 12.8 oz (49.4 kg)  02/04/22 112 lb (50.8 kg)     GEN: Well nourished, well developed, in no acute distress. HEENT: normal. Neck: Supple, no JVD, no carotid bruits, or masses. Cardiac: irregularly irregular, no murmurs, rubs, or gallops. No clubbing, cyanosis, edema.  Radials/PT 2+ and equal bilaterally.  Respiratory:  Respirations regular and unlabored, clear to auscultation bilaterally. GI: Soft, nontender, nondistended. MS: No deformity or atrophy. Skin: Warm and dry, no rash. Neuro:  Strength and sensation are intact. Psych: Normal affect.  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/moderate calcified abdominal aorta -Discussed starting a lipid-lowering agent depending on her lipid panel on Monday -Do think that we have some room for improvement there and would prefer her LDL be less than 70   Multiple myeloma -She is seen by the [facility] -She is participating in exercise classes at [facility] well -Continue current medications -She is hoping to avoid a port placement  Persistent atrial fibrillation -She is in atrial fibrillation today -Asymptomatic other than occasionally feeling like she needs to take a deep breath -Continue Eliquis 2.5 mg twice a day for stroke prevention -Not interested in DCCV  Right carotid bruit -stable, no bruit heard today -last checked 2018 -asymptomatic -consider repeat carotid US at next appointment        Disposition: Follow up 2-3 months with Candee Furbish, MD or APP.  Signed, Elgie Collard, PA-C 03/18/2022, 11:36 AM [place]

## 2022-03-18 ENCOUNTER — Ambulatory Visit: Payer: Medicare PPO | Attending: Physician Assistant | Admitting: Physician Assistant

## 2022-03-18 ENCOUNTER — Encounter: Payer: Self-pay | Admitting: Physician Assistant

## 2022-03-18 VITALS — BP 122/72 | HR 72 | Ht 62.0 in | Wt 111.4 lb

## 2022-03-18 DIAGNOSIS — I4819 Other persistent atrial fibrillation: Secondary | ICD-10-CM | POA: Diagnosis not present

## 2022-03-18 DIAGNOSIS — I2510 Atherosclerotic heart disease of native coronary artery without angina pectoris: Secondary | ICD-10-CM

## 2022-03-18 DIAGNOSIS — I2583 Coronary atherosclerosis due to lipid rich plaque: Secondary | ICD-10-CM

## 2022-03-18 DIAGNOSIS — J439 Emphysema, unspecified: Secondary | ICD-10-CM | POA: Diagnosis not present

## 2022-03-18 DIAGNOSIS — R0989 Other specified symptoms and signs involving the circulatory and respiratory systems: Secondary | ICD-10-CM

## 2022-03-18 NOTE — Patient Instructions (Signed)
Medication Instructions:  Your physician recommends that you continue on your current medications as directed. Please refer to the Current Medication list given to you today.  *If you need a refill on your cardiac medications before your next appointment, please call your pharmacy*   Lab Work: Have oncology add a lipid panel when they draw your labs on Monday and ask them to fax [place] the results [phone] If you have labs (blood work) drawn today and your tests are completely normal, you will receive your results only by: [place] (if you have MyChart) OR A paper copy in the mail If you have any lab test that is abnormal or we need to change your treatment, we will call you to review the results.   Follow-Up: At [facility], you and your health needs are our priority.  As part of our continuing mission to provide you with exceptional heart care, we have created designated Provider Care Teams.  These Care Teams include your primary Cardiologist (physician) and Advanced Practice Providers (APPs -  Physician Assistants and Nurse Practitioners) who all work together to provide you with the care you need, when you need it.   Your next appointment:   2-3 month(s)  Provider:   Dr Marlou Porch

## 2022-03-20 ENCOUNTER — Other Ambulatory Visit (HOSPITAL_COMMUNITY): Payer: Self-pay | Admitting: Physician Assistant

## 2022-03-22 NOTE — Telephone Encounter (Signed)
Prescription refill request for Eliquis received. Indication:afib Last office visit:2/24 Scr:0.6 2/24 Age: 82 Weight:50.5  kg  Prescription refilled

## 2022-04-01 ENCOUNTER — Telehealth (HOSPITAL_BASED_OUTPATIENT_CLINIC_OR_DEPARTMENT_OTHER): Payer: Self-pay | Admitting: *Deleted

## 2022-04-01 NOTE — Telephone Encounter (Signed)
Returned pts call. Pt reports some spotting, dark brown in color, that occurred after placement of pessary. She is having to do a 24 hour urine collection to turn in on Tuesday and needs the pessary in place to make it less difficult to obtain the urine collection. She denies any pain. She would like to have the pessary removed next week. Appt provided.

## 2022-04-01 NOTE — Telephone Encounter (Signed)
Patient called and stated she is having  a lot of bleeding and needs to have the pessary taken out.Patient would like for [place] nurse to please call her .

## 2022-04-07 ENCOUNTER — Encounter (HOSPITAL_BASED_OUTPATIENT_CLINIC_OR_DEPARTMENT_OTHER): Payer: Self-pay | Admitting: Obstetrics & Gynecology

## 2022-04-07 ENCOUNTER — Ambulatory Visit (HOSPITAL_BASED_OUTPATIENT_CLINIC_OR_DEPARTMENT_OTHER): Payer: Medicare PPO | Admitting: Obstetrics & Gynecology

## 2022-04-07 VITALS — BP 129/61 | HR 69 | Wt 112.2 lb

## 2022-04-07 DIAGNOSIS — N8110 Cystocele, unspecified: Secondary | ICD-10-CM | POA: Diagnosis not present

## 2022-04-07 DIAGNOSIS — T8389XA Other specified complication of genitourinary prosthetic devices, implants and grafts, initial encounter: Secondary | ICD-10-CM

## 2022-04-07 DIAGNOSIS — Z4689 Encounter for fitting and adjustment of other specified devices: Secondary | ICD-10-CM

## 2022-04-07 DIAGNOSIS — N898 Other specified noninflammatory disorders of vagina: Secondary | ICD-10-CM

## 2022-04-07 NOTE — Progress Notes (Signed)
82 y.o. Widowed White female G1P1001 here for pessary check.  She reports some dark discharge.  She had to complete a 24 hour urine for her multiple myeloma and this was completed yesterday.  She is not sexually active.    Patient has been using following pessary style and size:  #3 milex.  Allergies  Allergen Reactions   Ciprofloxacin Other (See Comments)    Musculskeletal pain.   Codeine Nausea And Vomiting   Macrobid [Nitrofurantoin] Nausea Only and Other (See Comments)    Dizzy, headache,   Pholcodine Diarrhea   Sulfa Antibiotics Nausea Only    ROS: Denies:  dysuria  Exam:   BP 129/61 (BP Location: Right Arm, Patient Position: Sitting, Cuff Size: Normal)   Pulse 69   Wt 112 lb 3.2 oz (50.9 kg)   LMP 02/16/1988 (LMP Unknown)   BMI 20.52 kg/m   General appearance: alert and no distress Inguinal lymph nodes:  not enlarged  Pelvic: External genitalia:  no lesions              Urethra: normal appearing urethra with no masses, tenderness or lesions              Bartholins and Skenes: Bartholin's, Urethra, Skene's normal                 Vagina: normal appearing vagina with normal color and discharge, no lesions              Cervix: normal appearance   Pap obtained:  no Bimanual Exam:  Uterus:  uterus is normal size, shape, consistency and nontender                               Adnexa:    normal adnexa in size, nontender and no masses                               Anus:  normal sphincter tone, no lesions  Pessary was removed without difficulty.  Pessary was cleansed.  Vaginal erosion noted as per above.  Pessary was not replaced. Patient tolerated procedure well.    Assessment/Plan: 1. 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 Pessary maintenance  3. Vaginal erosion secondary to pessary use, subsequent encounter - pt will return in 4-6 weeks for replacement.

## 2022-04-19 DIAGNOSIS — C9000 Multiple myeloma not having achieved remission: Secondary | ICD-10-CM | POA: Diagnosis not present

## 2022-04-19 DIAGNOSIS — J449 Chronic obstructive pulmonary disease, unspecified: Secondary | ICD-10-CM | POA: Diagnosis not present

## 2022-04-19 DIAGNOSIS — I480 Paroxysmal atrial fibrillation: Secondary | ICD-10-CM | POA: Diagnosis not present

## 2022-04-19 DIAGNOSIS — D72820 Lymphocytosis (symptomatic): Secondary | ICD-10-CM | POA: Diagnosis not present

## 2022-05-04 DIAGNOSIS — I4891 Unspecified atrial fibrillation: Secondary | ICD-10-CM | POA: Diagnosis not present

## 2022-05-04 DIAGNOSIS — Z5112 Encounter for antineoplastic immunotherapy: Secondary | ICD-10-CM | POA: Diagnosis not present

## 2022-05-04 DIAGNOSIS — Z79899 Other long term (current) drug therapy: Secondary | ICD-10-CM | POA: Diagnosis not present

## 2022-05-04 DIAGNOSIS — I10 Essential (primary) hypertension: Secondary | ICD-10-CM | POA: Diagnosis not present

## 2022-05-04 DIAGNOSIS — M545 Low back pain, unspecified: Secondary | ICD-10-CM | POA: Diagnosis not present

## 2022-05-04 DIAGNOSIS — R5383 Other fatigue: Secondary | ICD-10-CM | POA: Diagnosis not present

## 2022-05-04 DIAGNOSIS — C9000 Multiple myeloma not having achieved remission: Secondary | ICD-10-CM | POA: Diagnosis not present

## 2022-05-04 DIAGNOSIS — E039 Hypothyroidism, unspecified: Secondary | ICD-10-CM | POA: Diagnosis not present

## 2022-05-05 DIAGNOSIS — C9000 Multiple myeloma not having achieved remission: Secondary | ICD-10-CM | POA: Diagnosis not present

## 2022-05-05 DIAGNOSIS — L600 Ingrowing nail: Secondary | ICD-10-CM | POA: Diagnosis not present

## 2022-05-05 DIAGNOSIS — Z79899 Other long term (current) drug therapy: Secondary | ICD-10-CM | POA: Diagnosis not present

## 2022-05-05 DIAGNOSIS — D472 Monoclonal gammopathy: Secondary | ICD-10-CM | POA: Diagnosis not present

## 2022-05-17 DIAGNOSIS — D72820 Lymphocytosis (symptomatic): Secondary | ICD-10-CM | POA: Diagnosis not present

## 2022-05-17 DIAGNOSIS — Z881 Allergy status to other antibiotic agents status: Secondary | ICD-10-CM | POA: Diagnosis not present

## 2022-05-17 DIAGNOSIS — C9000 Multiple myeloma not having achieved remission: Secondary | ICD-10-CM | POA: Diagnosis not present

## 2022-05-17 DIAGNOSIS — I341 Nonrheumatic mitral (valve) prolapse: Secondary | ICD-10-CM | POA: Diagnosis not present

## 2022-05-17 DIAGNOSIS — J449 Chronic obstructive pulmonary disease, unspecified: Secondary | ICD-10-CM | POA: Diagnosis not present

## 2022-05-17 DIAGNOSIS — E039 Hypothyroidism, unspecified: Secondary | ICD-10-CM | POA: Diagnosis not present

## 2022-05-17 DIAGNOSIS — M8580 Other specified disorders of bone density and structure, unspecified site: Secondary | ICD-10-CM | POA: Diagnosis not present

## 2022-05-17 DIAGNOSIS — I480 Paroxysmal atrial fibrillation: Secondary | ICD-10-CM | POA: Diagnosis not present

## 2022-05-17 DIAGNOSIS — Z882 Allergy status to sulfonamides status: Secondary | ICD-10-CM | POA: Diagnosis not present

## 2022-05-19 DIAGNOSIS — I480 Paroxysmal atrial fibrillation: Secondary | ICD-10-CM | POA: Diagnosis not present

## 2022-05-19 DIAGNOSIS — D472 Monoclonal gammopathy: Secondary | ICD-10-CM | POA: Diagnosis not present

## 2022-05-19 DIAGNOSIS — C9000 Multiple myeloma not having achieved remission: Secondary | ICD-10-CM | POA: Diagnosis not present

## 2022-05-19 DIAGNOSIS — D84821 Immunodeficiency due to drugs: Secondary | ICD-10-CM | POA: Diagnosis not present

## 2022-05-27 ENCOUNTER — Ambulatory Visit (HOSPITAL_BASED_OUTPATIENT_CLINIC_OR_DEPARTMENT_OTHER): Payer: Medicare PPO | Admitting: Obstetrics & Gynecology

## 2022-06-01 DIAGNOSIS — Z5112 Encounter for antineoplastic immunotherapy: Secondary | ICD-10-CM | POA: Diagnosis not present

## 2022-06-01 DIAGNOSIS — C9000 Multiple myeloma not having achieved remission: Secondary | ICD-10-CM | POA: Diagnosis not present

## 2022-06-09 ENCOUNTER — Ambulatory Visit: Payer: Medicare PPO | Attending: Cardiology | Admitting: Cardiology

## 2022-06-09 ENCOUNTER — Encounter: Payer: Self-pay | Admitting: Cardiology

## 2022-06-09 VITALS — BP 108/64 | HR 79 | Ht 62.0 in | Wt 107.0 lb

## 2022-06-09 DIAGNOSIS — I2583 Coronary atherosclerosis due to lipid rich plaque: Secondary | ICD-10-CM | POA: Diagnosis not present

## 2022-06-09 DIAGNOSIS — R0989 Other specified symptoms and signs involving the circulatory and respiratory systems: Secondary | ICD-10-CM | POA: Diagnosis not present

## 2022-06-09 DIAGNOSIS — I4819 Other persistent atrial fibrillation: Secondary | ICD-10-CM

## 2022-06-09 DIAGNOSIS — I2510 Atherosclerotic heart disease of native coronary artery without angina pectoris: Secondary | ICD-10-CM

## 2022-06-09 NOTE — Patient Instructions (Signed)
Medication Instructions:  Your physician recommends that you continue on your current medications as directed. Please refer to the Current Medication list given to you today.  *If you need a refill on your cardiac medications before your next appointment, please call your pharmacy*   Lab Work: lipids If you have labs (blood work) drawn today and your tests are completely normal, you will receive your results only by: MyChart Message (if you have MyChart) OR A paper copy in the mail If you have any lab test that is abnormal or we need to change your treatment, we will call you to review the results.    Follow-Up: At [facility], you and your health needs are our priority.  As part of our continuing mission to provide you with exceptional heart care, we have created designated Provider Care Teams.  These Care Teams include your primary Cardiologist (physician) and Advanced Practice Providers (APPs -  Physician Assistants and Nurse Practitioners) who all work together to provide you with the care you need, when you need it.    Your next appointment:   1 year(s)  Provider:   Donato Schultz, MD

## 2022-06-09 NOTE — Progress Notes (Signed)
Cardiology Office Note:    Date:  06/09/2022   ID:  TALAYSIA PINHEIRO, DOB 1940/08/09, MRN [id number]  PCP:  Lorenda Ishihara, MD   [facility] Providers Cardiologist:  Donato Schultz, MD     Referring MD: Merlene Laughter, MD    History of Present Illness:    Ariel Holmes is a 82 y.o. female former patient of Dr. Verdis Prime is here for follow-up of atrial fibrillation longstanding persistent.  Has been seen in the atrial fibrillation clinic in the past and patient did not wish to proceed with DC cardioversion.  Is on Eliquis, dose adjusted.  Also had heavy coronary artery atherosclerotic calcification noted on prior scan.  Aortic atherosclerosis as well.  Has been treated for multiple myeloma.  Previously discussed driving LDL down below 70.  Dr. Pete Glatter noted AFIB, asymptomatic. Noted myeloma.   Past Medical History:  Diagnosis Date   COPD (chronic obstructive pulmonary disease)    History of chicken pox    HSV-1 infection    Hx of colonic polyps    Hypercholesterolemia    Hypothyroidism    Melanoma of thigh 02/15/2011   Multiple myeloma    followed at [facility]   MVP (mitral valve prolapse)    Osteopenia    Prolapsed uterus    Raynaud disease    Varicose vein     Past Surgical History:  Procedure Laterality Date   BREAST SURGERY     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 Patient does not remember exact date of surgery.   CATARACT EXTRACTION Right 10/25/2017   CESAREAN SECTION  1965   MELANOMA EXCISION  12/31/2010   Procedure: MELANOMA EXCISION;  Surgeon: Liz Malady, MD;  Location: [facility];  Service: General;  Laterality: Left;  wide excision melanoma left thigh, excision lesion left thigh   OTHER SURGICAL HISTORY Right    Surgical repair of architectual distortion Right Breast- Hyperplasia   TONSILLECTOMY      Current Medications: Current Meds  Medication Sig   acyclovir ointment (ZOVIRAX) 5 % Apply topically every 3 (three)  hours. Use as neede for cold sores   amLODipine (NORVASC) 5 MG tablet Take 1 tablet by mouth daily.   Calcium Carbonate-Vitamin D (CALCIUM + D PO) Take by mouth daily.   Cholecalciferol (VITAMIN D) 2000 UNITS CAPS Take 1 capsule by mouth daily.   ELIQUIS 2.5 MG TABS tablet TAKE 1 TABLET BY MOUTH TWICE A DAY   fluticasone (FLONASE) 50 MCG/ACT nasal spray PLACE 2 SPRAYS INTO BOTH NOSTRILS AS NEEDED.   hydrocortisone 2.5 % cream APPLY TOPICALLY 3 (THREE) TIMES DAILY. USE AS NEEDED.   ISATUXIMAB-IRFC IV Inject into the vein every 14 (fourteen) days.   levothyroxine (SYNTHROID, LEVOTHROID) 88 MCG tablet Take 1 tablet (88 mcg total) by mouth daily before breakfast.   LUTEIN PO Take 6 mg by mouth daily.   metoprolol succinate (TOPROL-XL) 25 MG 24 hr tablet TAKE 1.5 TABLETS BY MOUTH DAILY.   Multiple Vitamin (MULTIVITAMIN PO) Take by mouth.   Multiple Vitamins-Minerals (PRESERVISION AREDS 2 PO) Take by mouth.   nicotine polacrilex (NICORETTE) 2 MG gum Take 2 mg by mouth as needed. Not smoking   NONFORMULARY OR COMPOUNDED ITEM Vaginal estradiol cream 0.02%.  1/2 gram per vagina at bedtime up to two times weekly.  Disp:  8 grams.   REVLIMID 5 MG capsule Take by mouth.   valACYclovir (VALTREX) 1000 MG tablet 2 tabs (2gm) x 1, repeat in 12 hours with  symptom onset   Zoledronic Acid (ZOMETA) 4 MG/100ML IVPB Inject 4 mg into the vein every 3 (three) months.     Allergies:   Ciprofloxacin, Codeine, Macrobid [nitrofurantoin], Pholcodine, and Sulfa antibiotics   Social History   Socioeconomic History   Marital status: Widowed    Spouse name: Environmental education officer   Number of children: Not on file   Years of education: Not on file   Highest education level: Not on file  Occupational History   Not on file  Tobacco Use   Smoking status: Former    Packs/day: 1.00    Years: 50.00    Additional pack years: 0.00    Total pack years: 50.00    Types: Cigarettes    Start date: 03/08/1960    Quit date: 07/23/2010     Years since quitting: 11.8   Smokeless tobacco: Never   Tobacco comments:    Former smoker 07/29/21  Vaping Use   Vaping Use: Never used  Substance and Sexual Activity   Alcohol use: No    Alcohol/week: 0.0 standard drinks of alcohol   Drug use: No   Sexual activity: Not Currently    Partners: Male    Birth control/protection: Post-menopausal  Other Topics Concern   Not on file  Social History Narrative   Not on file   Social Determinants of Health   Financial Resource Strain: Not on file  Food Insecurity: Not on file  Transportation Needs: Not on file  Physical Activity: Not on file  Stress: Not on file  Social Connections: Not on file     Family History: The patient's family history includes CVA in her maternal grandfather; Congestive Heart Failure in her mother; Diabetes in her paternal grandfather and paternal grandmother; Heart attack (age of onset: 80) in her father; Heart disease in her mother; Hyperlipidemia in her mother; Hypertension in her maternal grandfather and mother; Stroke in her father; Thyroid disease in her mother. There is no history of Esophageal cancer, Rectal cancer, Stomach cancer, or Colon cancer.  ROS:   Please see the history of present illness.     All other systems reviewed and are negative.  EKGs/Labs/Other Studies Reviewed:    The following studies were reviewed today: Cardiac Studies & Procedures       ECHOCARDIOGRAM  ECHOCARDIOGRAM COMPLETE 08/05/2021  Narrative ECHOCARDIOGRAM REPORT    Patient Name:   Ariel Holmes Date of Exam: 08/05/2021 Medical Rec #:  [id number]      Height:       62.0 in Accession #:    [id number]     Weight:       112.6 lb Date of Birth:  03-05-1940       BSA:          1.498 m Patient Age:    80 years       BP:           117/68 mmHg Patient Gender: F              HR:           75 bpm. Exam Location:  Outpatient  Procedure: 2D Echo  Indications:    Atrial fibrillation  History:        Patient has prior  history of Echocardiogram examinations, most recent 01/04/2017.  Sonographer:    Delcie Roch RDCS Referring Phys: [id number] CL[place] R F[place]  IMPRESSIONS   1. Left ventricular ejection fraction, by estimation, is 55 to 60%. The left ventricle has  normal function. The left ventricle has no regional wall motion abnormalities. There is mild left ventricular hypertrophy. Left ventricular diastolic parameters are indeterminate. 2. Right ventricular systolic function is mildly reduced. The right ventricular size is normal. There is mildly elevated pulmonary artery systolic pressure. The estimated right ventricular systolic pressure is 39.1 mmHg. 3. The mitral valve is degenerative. Mild mitral valve regurgitation. Moderate mitral annular calcification. 4. The aortic valve is tricuspid. Aortic valve regurgitation is not visualized. Aortic valve sclerosis/calcification is present, without any evidence of aortic stenosis. 5. The inferior vena cava is normal in size with <50% respiratory variability, suggesting right atrial pressure of 8 mmHg.  FINDINGS Left Ventricle: Left ventricular ejection fraction, by estimation, is 55 to 60%. The left ventricle has normal function. The left ventricle has no regional wall motion abnormalities. The left ventricular internal cavity size was normal in size. There is mild left ventricular hypertrophy. Left ventricular diastolic parameters are indeterminate.  Right Ventricle: The right ventricular size is normal. Right vetricular wall thickness was not well visualized. Right ventricular systolic function is mildly reduced. There is mildly elevated pulmonary artery systolic pressure. The tricuspid regurgitant velocity is 2.79 m/s, and with an assumed right atrial pressure of 8 mmHg, the estimated right ventricular systolic pressure is 39.1 mmHg.  Left Atrium: Left atrial size was normal in size.  Right Atrium: Right atrial size was normal in size.  Pericardium:  There is no evidence of pericardial effusion.  Mitral Valve: The mitral valve is degenerative in appearance. Moderate mitral annular calcification. Mild mitral valve regurgitation.  Tricuspid Valve: The tricuspid valve is normal in structure. Tricuspid valve regurgitation is mild.  Aortic Valve: The aortic valve is tricuspid. Aortic valve regurgitation is not visualized. Aortic valve sclerosis/calcification is present, without any evidence of aortic stenosis.  Pulmonic Valve: The pulmonic valve was not well visualized. Pulmonic valve regurgitation is mild.  Aorta: The aortic root and ascending aorta are structurally normal, with no evidence of dilitation.  Venous: The inferior vena cava is normal in size with less than 50% respiratory variability, suggesting right atrial pressure of 8 mmHg.  IAS/Shunts: The interatrial septum was not well visualized.   LEFT VENTRICLE PLAX 2D LVIDd:         3.80 cm LVIDs:         2.20 cm LV PW:         0.90 cm LV IVS:        1.10 cm LVOT diam:     1.70 cm LVOT Area:     2.27 cm   IVC IVC diam: 2.00 cm  LEFT ATRIUM             Index        RIGHT ATRIUM           Index LA diam:        4.00 cm 2.67 cm/m   RA Area:     11.60 cm LA Vol (A2C):   50.5 ml 33.72 ml/m  RA Volume:   23.70 ml  15.83 ml/m LA Vol (A4C):   39.9 ml 26.64 ml/m LA Biplane Vol: 46.3 ml 30.92 ml/m  AORTA Ao Root diam: 3.10 cm Ao Asc diam:  2.90 cm  TRICUSPID VALVE TR Peak grad:   31.1 mmHg TR Vmax:        279.00 cm/s  SHUNTS Systemic Diam: 1.70 cm  Epifanio Lesches MD Electronically signed by Epifanio Lesches MD Signature Date/Time: 08/05/2021/11:30:24 PM    Final  EKG:  06/09/22: AFIB rate controlled  Recent Labs: 09/03/2021: BUN 18; Creatinine, Ser 0.77; Hemoglobin 12.3; Platelets 169; Potassium 4.7; Sodium 139  Recent Lipid Panel    Component Value Date/Time   CHOL 199 06/14/2017 1100   TRIG 83.0 06/14/2017 1100   HDL 78.30  06/14/2017 1100   CHOLHDL 3 06/14/2017 1100   VLDL 16.6 06/14/2017 1100   LDLCALC 104 (H) 06/14/2017 1100   LDLDIRECT 105.0 04/19/2013 1034     Risk Assessment/Calculations:               Physical Exam:    VS:  BP 108/64   Pulse 79   Ht 5\' 2"  (1.575 m)   Wt 107 lb (48.5 kg)   LMP 02/16/1988 (LMP Unknown)   SpO2 99%   BMI 19.57 kg/m     Wt Readings from Last 3 Encounters:  06/09/22 107 lb (48.5 kg)  04/07/22 112 lb 3.2 oz (50.9 kg)  03/18/22 111 lb 6.4 oz (50.5 kg)     GEN:  Well nourished, well developed in no acute distress HEENT: Normal NECK: No JVD; Right carotid bruit LYMPHATICS: No lymphadenopathy CARDIAC: RRR, no murmurs, rubs, gallops RESPIRATORY:  Clear to auscultation without rales, wheezing or rhonchi  ABDOMEN: Soft, non-tender, non-distended MUSCULOSKELETAL:  No edema; No deformity  SKIN: Warm and dry NEUROLOGIC:  Alert and oriented x 3 PSYCHIATRIC:  Normal affect   ASSESSMENT:    1. Persistent atrial fibrillation   2. Coronary artery disease due to lipid rich plaque    PLAN:    In order of problems listed above:  Longstanding persistent atrial fibrillation - Continue with current Eliquis 2.5 mg twice a day, metoprolol low-dose twice a day.  No changes made.  No bleeding.  Frequent blood work at [facility].  Hemoglobin 13.9, platelets 227.  No need for cardioversion.  Coronary artery calcification/aortic atherosclerosis (seen on PET CT) - We will check a lipid panel today  Multiple myeloma - [facility].  Revlimid, Zometa.  No hair loss.  Tolerating treatments well.  Light chains have dramatically decreased.  She is very thankful.          Medication Adjustments/Labs and Tests Ordered: Current medicines are reviewed at length with the patient today.  Concerns regarding medicines are outlined above.  Orders Placed This Encounter  Procedures   Lipid panel   EKG 12-Lead   No orders of the defined types were placed in this  encounter.   Patient Instructions  Medication Instructions:  Your physician recommends that you continue on your current medications as directed. Please refer to the Current Medication list given to you today.  *If you need a refill on your cardiac medications before your next appointment, please call your pharmacy*   Lab Work: lipids If you have labs (blood work) drawn today and your tests are completely normal, you will receive your results only by: MyChart Message (if you have MyChart) OR A paper copy in the mail If you have any lab test that is abnormal or we need to change your treatment, we will call you to review the results.    Follow-Up: At [facility], you and your health needs are our priority.  As part of our continuing mission to provide you with exceptional heart care, we have created designated Provider Care Teams.  These Care Teams include your primary Cardiologist (physician) and Advanced Practice Providers (APPs -  Physician Assistants and Nurse Practitioners) who all work together to provide  you with the care you need, when you need it.    Your next appointment:   1 year(s)  Provider:   Donato Schultz, MD        Signed, Donato Schultz, MD  06/09/2022 11:10 AM    [facility]

## 2022-06-10 LAB — LIPID PANEL
Chol/HDL Ratio: 2 ratio (ref 0.0–4.4)
Cholesterol, Total: 150 mg/dL (ref 100–199)
HDL: 74 mg/dL (ref >39)
LDL Chol Calc (NIH): 56 mg/dL (ref 0–99)
Triglycerides: 116 mg/dL (ref 0–149)
VLDL Cholesterol Cal: 20 mg/dL (ref 5–40)

## 2022-06-11 ENCOUNTER — Encounter: Payer: Self-pay | Admitting: *Deleted

## 2022-06-29 DIAGNOSIS — Z5111 Encounter for antineoplastic chemotherapy: Secondary | ICD-10-CM | POA: Diagnosis not present

## 2022-06-29 DIAGNOSIS — C9000 Multiple myeloma not having achieved remission: Secondary | ICD-10-CM | POA: Diagnosis not present

## 2022-07-14 DIAGNOSIS — Z885 Allergy status to narcotic agent status: Secondary | ICD-10-CM | POA: Diagnosis not present

## 2022-07-14 DIAGNOSIS — Z811 Family history of alcohol abuse and dependence: Secondary | ICD-10-CM | POA: Diagnosis not present

## 2022-07-14 DIAGNOSIS — J449 Chronic obstructive pulmonary disease, unspecified: Secondary | ICD-10-CM | POA: Diagnosis not present

## 2022-07-14 DIAGNOSIS — E039 Hypothyroidism, unspecified: Secondary | ICD-10-CM | POA: Diagnosis not present

## 2022-07-14 DIAGNOSIS — C9000 Multiple myeloma not having achieved remission: Secondary | ICD-10-CM | POA: Diagnosis not present

## 2022-07-14 DIAGNOSIS — Z882 Allergy status to sulfonamides status: Secondary | ICD-10-CM | POA: Diagnosis not present

## 2022-07-14 DIAGNOSIS — Z79899 Other long term (current) drug therapy: Secondary | ICD-10-CM | POA: Diagnosis not present

## 2022-07-14 DIAGNOSIS — Z5112 Encounter for antineoplastic immunotherapy: Secondary | ICD-10-CM | POA: Diagnosis not present

## 2022-07-18 ENCOUNTER — Other Ambulatory Visit (HOSPITAL_COMMUNITY): Payer: Self-pay | Admitting: Physician Assistant

## 2022-07-19 NOTE — Telephone Encounter (Signed)
Please review for refill. Thank you! 

## 2022-07-19 NOTE — Telephone Encounter (Signed)
Prescription refill request for Eliquis received. Indication: afib  Last office visit: skains, 06/09/2022 Scr: 0.63, 07/14/2022 Age: 82 yo  Weight: 48.5 kg

## 2022-07-20 DIAGNOSIS — L738 Other specified follicular disorders: Secondary | ICD-10-CM | POA: Diagnosis not present

## 2022-07-20 DIAGNOSIS — Z85820 Personal history of malignant melanoma of skin: Secondary | ICD-10-CM | POA: Diagnosis not present

## 2022-07-20 DIAGNOSIS — L720 Epidermal cyst: Secondary | ICD-10-CM | POA: Diagnosis not present

## 2022-07-21 ENCOUNTER — Other Ambulatory Visit (HOSPITAL_BASED_OUTPATIENT_CLINIC_OR_DEPARTMENT_OTHER): Payer: Self-pay | Admitting: Obstetrics & Gynecology

## 2022-08-16 DIAGNOSIS — M545 Low back pain, unspecified: Secondary | ICD-10-CM | POA: Diagnosis not present

## 2022-08-16 DIAGNOSIS — C9000 Multiple myeloma not having achieved remission: Secondary | ICD-10-CM | POA: Diagnosis not present

## 2022-08-16 DIAGNOSIS — L600 Ingrowing nail: Secondary | ICD-10-CM | POA: Diagnosis not present

## 2022-08-16 DIAGNOSIS — Z5112 Encounter for antineoplastic immunotherapy: Secondary | ICD-10-CM | POA: Diagnosis not present

## 2022-08-16 DIAGNOSIS — D72820 Lymphocytosis (symptomatic): Secondary | ICD-10-CM | POA: Diagnosis not present

## 2022-08-16 DIAGNOSIS — J449 Chronic obstructive pulmonary disease, unspecified: Secondary | ICD-10-CM | POA: Diagnosis not present

## 2022-08-16 DIAGNOSIS — M25559 Pain in unspecified hip: Secondary | ICD-10-CM | POA: Diagnosis not present

## 2022-08-16 DIAGNOSIS — K5900 Constipation, unspecified: Secondary | ICD-10-CM | POA: Diagnosis not present

## 2022-08-16 DIAGNOSIS — I480 Paroxysmal atrial fibrillation: Secondary | ICD-10-CM | POA: Diagnosis not present

## 2022-09-06 DIAGNOSIS — J449 Chronic obstructive pulmonary disease, unspecified: Secondary | ICD-10-CM | POA: Diagnosis not present

## 2022-09-06 DIAGNOSIS — Z5111 Encounter for antineoplastic chemotherapy: Secondary | ICD-10-CM | POA: Diagnosis not present

## 2022-09-06 DIAGNOSIS — E039 Hypothyroidism, unspecified: Secondary | ICD-10-CM | POA: Diagnosis not present

## 2022-09-06 DIAGNOSIS — Z882 Allergy status to sulfonamides status: Secondary | ICD-10-CM | POA: Diagnosis not present

## 2022-09-06 DIAGNOSIS — M8580 Other specified disorders of bone density and structure, unspecified site: Secondary | ICD-10-CM | POA: Diagnosis not present

## 2022-09-06 DIAGNOSIS — C9000 Multiple myeloma not having achieved remission: Secondary | ICD-10-CM | POA: Diagnosis not present

## 2022-09-21 DIAGNOSIS — I700 Atherosclerosis of aorta: Secondary | ICD-10-CM | POA: Diagnosis not present

## 2022-09-21 DIAGNOSIS — D6869 Other thrombophilia: Secondary | ICD-10-CM | POA: Diagnosis not present

## 2022-09-21 DIAGNOSIS — E46 Unspecified protein-calorie malnutrition: Secondary | ICD-10-CM | POA: Diagnosis not present

## 2022-09-21 DIAGNOSIS — E162 Hypoglycemia, unspecified: Secondary | ICD-10-CM | POA: Diagnosis not present

## 2022-09-21 DIAGNOSIS — C9000 Multiple myeloma not having achieved remission: Secondary | ICD-10-CM | POA: Diagnosis not present

## 2022-10-04 DIAGNOSIS — C9000 Multiple myeloma not having achieved remission: Secondary | ICD-10-CM | POA: Diagnosis not present

## 2022-10-04 DIAGNOSIS — M549 Dorsalgia, unspecified: Secondary | ICD-10-CM | POA: Diagnosis not present

## 2022-10-04 DIAGNOSIS — I480 Paroxysmal atrial fibrillation: Secondary | ICD-10-CM | POA: Diagnosis not present

## 2022-10-04 DIAGNOSIS — Z5112 Encounter for antineoplastic immunotherapy: Secondary | ICD-10-CM | POA: Diagnosis not present

## 2022-10-04 DIAGNOSIS — J449 Chronic obstructive pulmonary disease, unspecified: Secondary | ICD-10-CM | POA: Diagnosis not present

## 2022-10-04 DIAGNOSIS — R252 Cramp and spasm: Secondary | ICD-10-CM | POA: Diagnosis not present

## 2022-10-04 DIAGNOSIS — E162 Hypoglycemia, unspecified: Secondary | ICD-10-CM | POA: Diagnosis not present

## 2022-10-04 DIAGNOSIS — M25559 Pain in unspecified hip: Secondary | ICD-10-CM | POA: Diagnosis not present

## 2022-10-04 DIAGNOSIS — I2510 Atherosclerotic heart disease of native coronary artery without angina pectoris: Secondary | ICD-10-CM | POA: Diagnosis not present

## 2022-10-14 DIAGNOSIS — E162 Hypoglycemia, unspecified: Secondary | ICD-10-CM | POA: Diagnosis not present

## 2022-10-14 DIAGNOSIS — Z79899 Other long term (current) drug therapy: Secondary | ICD-10-CM | POA: Diagnosis not present

## 2022-10-28 DIAGNOSIS — H10413 Chronic giant papillary conjunctivitis, bilateral: Secondary | ICD-10-CM | POA: Diagnosis not present

## 2022-11-01 DIAGNOSIS — I480 Paroxysmal atrial fibrillation: Secondary | ICD-10-CM | POA: Diagnosis not present

## 2022-11-01 DIAGNOSIS — I2510 Atherosclerotic heart disease of native coronary artery without angina pectoris: Secondary | ICD-10-CM | POA: Diagnosis not present

## 2022-11-01 DIAGNOSIS — D801 Nonfamilial hypogammaglobulinemia: Secondary | ICD-10-CM | POA: Diagnosis not present

## 2022-11-01 DIAGNOSIS — Z5111 Encounter for antineoplastic chemotherapy: Secondary | ICD-10-CM | POA: Diagnosis not present

## 2022-11-01 DIAGNOSIS — Z713 Dietary counseling and surveillance: Secondary | ICD-10-CM | POA: Diagnosis not present

## 2022-11-01 DIAGNOSIS — J449 Chronic obstructive pulmonary disease, unspecified: Secondary | ICD-10-CM | POA: Diagnosis not present

## 2022-11-01 DIAGNOSIS — E039 Hypothyroidism, unspecified: Secondary | ICD-10-CM | POA: Diagnosis not present

## 2022-11-01 DIAGNOSIS — M85879 Other specified disorders of bone density and structure, unspecified ankle and foot: Secondary | ICD-10-CM | POA: Diagnosis not present

## 2022-11-01 DIAGNOSIS — C9000 Multiple myeloma not having achieved remission: Secondary | ICD-10-CM | POA: Diagnosis not present

## 2022-11-29 ENCOUNTER — Other Ambulatory Visit: Payer: Self-pay | Admitting: *Deleted

## 2022-11-29 DIAGNOSIS — E162 Hypoglycemia, unspecified: Secondary | ICD-10-CM | POA: Diagnosis not present

## 2022-11-29 DIAGNOSIS — Z23 Encounter for immunization: Secondary | ICD-10-CM | POA: Diagnosis not present

## 2022-11-29 DIAGNOSIS — I480 Paroxysmal atrial fibrillation: Secondary | ICD-10-CM | POA: Diagnosis not present

## 2022-11-29 DIAGNOSIS — C9000 Multiple myeloma not having achieved remission: Secondary | ICD-10-CM | POA: Diagnosis not present

## 2022-11-29 DIAGNOSIS — Z881 Allergy status to other antibiotic agents status: Secondary | ICD-10-CM | POA: Diagnosis not present

## 2022-11-29 DIAGNOSIS — Z885 Allergy status to narcotic agent status: Secondary | ICD-10-CM | POA: Diagnosis not present

## 2022-11-29 DIAGNOSIS — J449 Chronic obstructive pulmonary disease, unspecified: Secondary | ICD-10-CM | POA: Diagnosis not present

## 2022-11-29 MED ORDER — METOPROLOL SUCCINATE ER 25 MG PO TB24: 37.5000 mg | ORAL_TABLET | Freq: Every day | ORAL | 1 refills | Status: DC

## 2022-12-28 DIAGNOSIS — D72820 Lymphocytosis (symptomatic): Secondary | ICD-10-CM | POA: Diagnosis not present

## 2022-12-28 DIAGNOSIS — Z885 Allergy status to narcotic agent status: Secondary | ICD-10-CM | POA: Diagnosis not present

## 2022-12-28 DIAGNOSIS — J449 Chronic obstructive pulmonary disease, unspecified: Secondary | ICD-10-CM | POA: Diagnosis not present

## 2022-12-28 DIAGNOSIS — L299 Pruritus, unspecified: Secondary | ICD-10-CM | POA: Diagnosis not present

## 2022-12-28 DIAGNOSIS — C9000 Multiple myeloma not having achieved remission: Secondary | ICD-10-CM | POA: Diagnosis not present

## 2022-12-28 DIAGNOSIS — K5900 Constipation, unspecified: Secondary | ICD-10-CM | POA: Diagnosis not present

## 2022-12-28 DIAGNOSIS — I480 Paroxysmal atrial fibrillation: Secondary | ICD-10-CM | POA: Diagnosis not present

## 2022-12-28 DIAGNOSIS — E162 Hypoglycemia, unspecified: Secondary | ICD-10-CM | POA: Diagnosis not present

## 2022-12-28 DIAGNOSIS — R799 Abnormal finding of blood chemistry, unspecified: Secondary | ICD-10-CM | POA: Diagnosis not present

## 2023-01-17 ENCOUNTER — Telehealth: Payer: Self-pay | Admitting: Cardiology

## 2023-01-17 ENCOUNTER — Other Ambulatory Visit (HOSPITAL_COMMUNITY): Payer: Self-pay | Admitting: Physician Assistant

## 2023-01-17 MED ORDER — METOPROLOL SUCCINATE ER 25 MG PO TB24: 37.5000 mg | ORAL_TABLET | Freq: Every day | ORAL | 1 refills | Status: DC

## 2023-01-17 MED ORDER — APIXABAN 2.5 MG PO TABS: 2.5000 mg | ORAL_TABLET | Freq: Two times a day (BID) | ORAL | 5 refills | Status: DC

## 2023-01-17 NOTE — Telephone Encounter (Signed)
Lmom, Medications were sent to pt pharmacy per pts request (Eliquis 2.5 mg & Metoprolol 25 mg,1.5 tablets). Pt advised to call back if she has any questions or concerns.

## 2023-01-17 NOTE — Telephone Encounter (Signed)
*  STAT* If patient is at the pharmacy, call can be transferred to refill team.   1. Which medications need to be refilled? (please list name of each medication and dose if known)  apixaban (ELIQUIS) 2.5 MG TABS tablet  metoprolol succinate (TOPROL-XL) 25 MG 24 hr tablet   2. Would you like to learn more about the convenience, safety, & potential cost savings by using the [facility]?    3. Are you open to using the [facility] (Type [facility].   4. Which pharmacy/location (including street and city if local pharmacy) is medication to be sent to?  [id number] - [place], [place] - [address]     5. Do they need a 30 day or 90 day supply? 90 day    Patient only has one days worth of meds left.

## 2023-01-20 DIAGNOSIS — H353132 Nonexudative age-related macular degeneration, bilateral, intermediate dry stage: Secondary | ICD-10-CM | POA: Diagnosis not present

## 2023-01-20 DIAGNOSIS — Z961 Presence of intraocular lens: Secondary | ICD-10-CM | POA: Diagnosis not present

## 2023-01-20 DIAGNOSIS — H40053 Ocular hypertension, bilateral: Secondary | ICD-10-CM | POA: Diagnosis not present

## 2023-01-21 DIAGNOSIS — I480 Paroxysmal atrial fibrillation: Secondary | ICD-10-CM | POA: Diagnosis not present

## 2023-01-21 DIAGNOSIS — I10 Essential (primary) hypertension: Secondary | ICD-10-CM | POA: Diagnosis not present

## 2023-01-21 DIAGNOSIS — Z0000 Encounter for general adult medical examination without abnormal findings: Secondary | ICD-10-CM | POA: Diagnosis not present

## 2023-01-21 DIAGNOSIS — D84821 Immunodeficiency due to drugs: Secondary | ICD-10-CM | POA: Diagnosis not present

## 2023-01-21 DIAGNOSIS — I700 Atherosclerosis of aorta: Secondary | ICD-10-CM | POA: Diagnosis not present

## 2023-01-21 DIAGNOSIS — R54 Age-related physical debility: Secondary | ICD-10-CM | POA: Diagnosis not present

## 2023-01-21 DIAGNOSIS — E039 Hypothyroidism, unspecified: Secondary | ICD-10-CM | POA: Diagnosis not present

## 2023-01-21 DIAGNOSIS — E46 Unspecified protein-calorie malnutrition: Secondary | ICD-10-CM | POA: Diagnosis not present

## 2023-01-21 DIAGNOSIS — C9000 Multiple myeloma not having achieved remission: Secondary | ICD-10-CM | POA: Diagnosis not present

## 2023-01-21 DIAGNOSIS — Z1331 Encounter for screening for depression: Secondary | ICD-10-CM | POA: Diagnosis not present

## 2023-01-25 DIAGNOSIS — K5900 Constipation, unspecified: Secondary | ICD-10-CM | POA: Diagnosis not present

## 2023-01-25 DIAGNOSIS — M25559 Pain in unspecified hip: Secondary | ICD-10-CM | POA: Diagnosis not present

## 2023-01-25 DIAGNOSIS — J449 Chronic obstructive pulmonary disease, unspecified: Secondary | ICD-10-CM | POA: Diagnosis not present

## 2023-01-25 DIAGNOSIS — C9001 Multiple myeloma in remission: Secondary | ICD-10-CM | POA: Diagnosis not present

## 2023-01-25 DIAGNOSIS — E162 Hypoglycemia, unspecified: Secondary | ICD-10-CM | POA: Diagnosis not present

## 2023-01-25 DIAGNOSIS — I480 Paroxysmal atrial fibrillation: Secondary | ICD-10-CM | POA: Diagnosis not present

## 2023-01-25 DIAGNOSIS — D72820 Lymphocytosis (symptomatic): Secondary | ICD-10-CM | POA: Diagnosis not present

## 2023-01-25 DIAGNOSIS — M25511 Pain in right shoulder: Secondary | ICD-10-CM | POA: Diagnosis not present

## 2023-01-25 DIAGNOSIS — L299 Pruritus, unspecified: Secondary | ICD-10-CM | POA: Diagnosis not present

## 2023-01-26 DIAGNOSIS — L853 Xerosis cutis: Secondary | ICD-10-CM | POA: Diagnosis not present

## 2023-01-26 DIAGNOSIS — L821 Other seborrheic keratosis: Secondary | ICD-10-CM | POA: Diagnosis not present

## 2023-01-26 DIAGNOSIS — D692 Other nonthrombocytopenic purpura: Secondary | ICD-10-CM | POA: Diagnosis not present

## 2023-01-26 DIAGNOSIS — L570 Actinic keratosis: Secondary | ICD-10-CM | POA: Diagnosis not present

## 2023-01-26 DIAGNOSIS — D2272 Melanocytic nevi of left lower limb, including hip: Secondary | ICD-10-CM | POA: Diagnosis not present

## 2023-01-26 DIAGNOSIS — I8391 Asymptomatic varicose veins of right lower extremity: Secondary | ICD-10-CM | POA: Diagnosis not present

## 2023-01-26 DIAGNOSIS — Z85820 Personal history of malignant melanoma of skin: Secondary | ICD-10-CM | POA: Diagnosis not present

## 2023-01-26 DIAGNOSIS — I788 Other diseases of capillaries: Secondary | ICD-10-CM | POA: Diagnosis not present

## 2023-01-26 DIAGNOSIS — L814 Other melanin hyperpigmentation: Secondary | ICD-10-CM | POA: Diagnosis not present

## 2023-02-15 ENCOUNTER — Other Ambulatory Visit (HOSPITAL_COMMUNITY)
Admission: RE | Admit: 2023-02-15 | Discharge: 2023-02-15 | Disposition: A | Discharge status: Home / Self Care | Payer: Medicare PPO | Source: Ambulatory Visit | Attending: Obstetrics & Gynecology | Admitting: Obstetrics & Gynecology

## 2023-02-15 ENCOUNTER — Ambulatory Visit (HOSPITAL_BASED_OUTPATIENT_CLINIC_OR_DEPARTMENT_OTHER): Payer: Medicare PPO

## 2023-02-15 ENCOUNTER — Encounter (HOSPITAL_BASED_OUTPATIENT_CLINIC_OR_DEPARTMENT_OTHER): Payer: Self-pay

## 2023-02-15 VITALS — BP 110/69 | HR 70 | Ht 62.0 in | Wt 105.2 lb

## 2023-02-15 DIAGNOSIS — R319 Hematuria, unspecified: Secondary | ICD-10-CM

## 2023-02-15 DIAGNOSIS — N390 Urinary tract infection, site not specified: Secondary | ICD-10-CM | POA: Diagnosis not present

## 2023-02-15 DIAGNOSIS — N898 Other specified noninflammatory disorders of vagina: Secondary | ICD-10-CM

## 2023-02-15 LAB — POCT URINALYSIS DIPSTICK
Bilirubin, UA: NEGATIVE
Glucose, UA: NEGATIVE
Ketones, UA: NEGATIVE
Leukocytes, UA: NEGATIVE
Nitrite, UA: NEGATIVE
Protein, UA: POSITIVE — AB
Spec Grav, UA: 1.015 (ref 1.010–1.025)
Urobilinogen, UA: 0.2 U/dL
pH, UA: 6 (ref 5.0–8.0)

## 2023-02-15 MED ORDER — CEPHALEXIN 250 MG PO CAPS: 250.0000 mg | ORAL_CAPSULE | Freq: Four times a day (QID) | ORAL | 0 refills | Status: AC

## 2023-02-15 NOTE — Progress Notes (Signed)
 Pt presented to office with symptoms of dysuria. Pt stated she has been having symptoms for about one week now. Pt was educated on how to obtain urine sample and aptima self swab. Both sample were obtained and poc dipstick was performed. Presented results to Arland Roller, CNM and she has sent treatment to pt pharmacy to treat uti. Pt is aware aptima and urine culture will be sent off to the lab. Once results come back pt is aware we will be in contact with her.

## 2023-02-17 LAB — CERVICOVAGINAL ANCILLARY ONLY
Bacterial Vaginitis (gardnerella): NEGATIVE
Candida Glabrata: NEGATIVE
Candida Vaginitis: NEGATIVE
Comment: NEGATIVE
Comment: NEGATIVE
Comment: NEGATIVE

## 2023-02-18 LAB — URINE CULTURE

## 2023-02-21 DIAGNOSIS — C9000 Multiple myeloma not having achieved remission: Secondary | ICD-10-CM | POA: Diagnosis not present

## 2023-02-21 DIAGNOSIS — K148 Other diseases of tongue: Secondary | ICD-10-CM | POA: Diagnosis not present

## 2023-02-21 DIAGNOSIS — R799 Abnormal finding of blood chemistry, unspecified: Secondary | ICD-10-CM | POA: Diagnosis not present

## 2023-03-25 DIAGNOSIS — Z79899 Other long term (current) drug therapy: Secondary | ICD-10-CM | POA: Diagnosis not present

## 2023-03-25 DIAGNOSIS — K148 Other diseases of tongue: Secondary | ICD-10-CM | POA: Diagnosis not present

## 2023-03-25 DIAGNOSIS — Z7901 Long term (current) use of anticoagulants: Secondary | ICD-10-CM | POA: Diagnosis not present

## 2023-03-25 DIAGNOSIS — Z792 Long term (current) use of antibiotics: Secondary | ICD-10-CM | POA: Diagnosis not present

## 2023-03-25 DIAGNOSIS — R799 Abnormal finding of blood chemistry, unspecified: Secondary | ICD-10-CM | POA: Diagnosis not present

## 2023-03-25 DIAGNOSIS — Z79890 Hormone replacement therapy (postmenopausal): Secondary | ICD-10-CM | POA: Diagnosis not present

## 2023-03-25 DIAGNOSIS — C9000 Multiple myeloma not having achieved remission: Secondary | ICD-10-CM | POA: Diagnosis not present

## 2023-03-25 DIAGNOSIS — R930 Abnormal findings on diagnostic imaging of skull and head, not elsewhere classified: Secondary | ICD-10-CM | POA: Diagnosis not present

## 2023-03-25 DIAGNOSIS — C9001 Multiple myeloma in remission: Secondary | ICD-10-CM | POA: Diagnosis not present

## 2023-03-25 DIAGNOSIS — Z87891 Personal history of nicotine dependence: Secondary | ICD-10-CM | POA: Diagnosis not present

## 2023-03-25 DIAGNOSIS — I10 Essential (primary) hypertension: Secondary | ICD-10-CM | POA: Diagnosis not present

## 2023-03-25 DIAGNOSIS — Z85828 Personal history of other malignant neoplasm of skin: Secondary | ICD-10-CM | POA: Diagnosis not present

## 2023-04-12 DIAGNOSIS — M25551 Pain in right hip: Secondary | ICD-10-CM | POA: Diagnosis not present

## 2023-04-12 DIAGNOSIS — M4727 Other spondylosis with radiculopathy, lumbosacral region: Secondary | ICD-10-CM | POA: Diagnosis not present

## 2023-04-13 IMAGING — DX DG BONE SURVEY MET
10 series · 10 of 10 positions shown · non-contrast
Comparison: None.

CLINICAL DATA: Abnormal laboratory examination, multiple myeloma

EXAM:
METASTATIC BONE SURVEY

[chest pa]
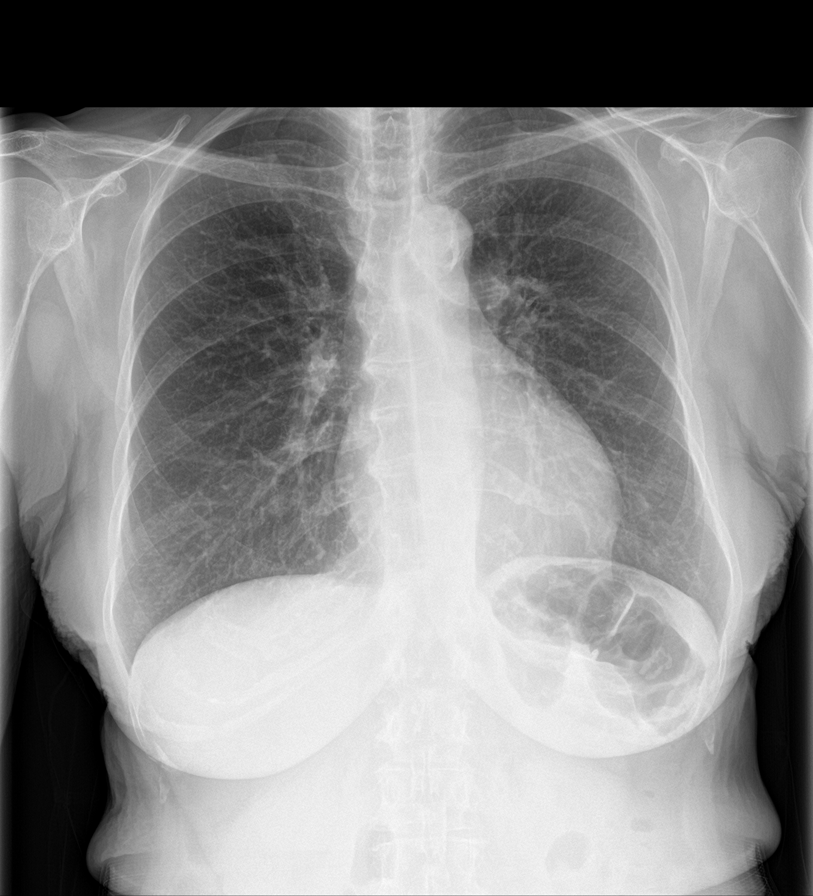

[c-spine lat]
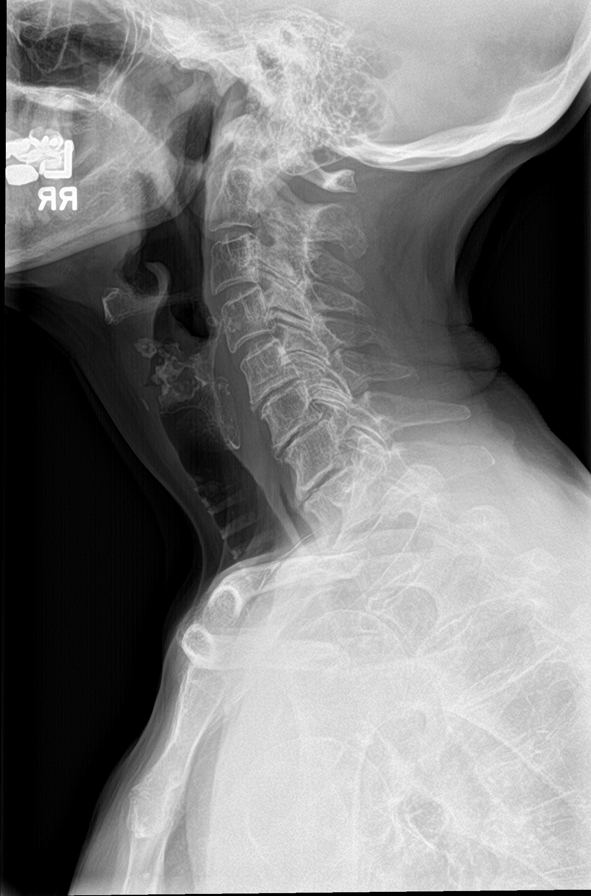

[skull lat]
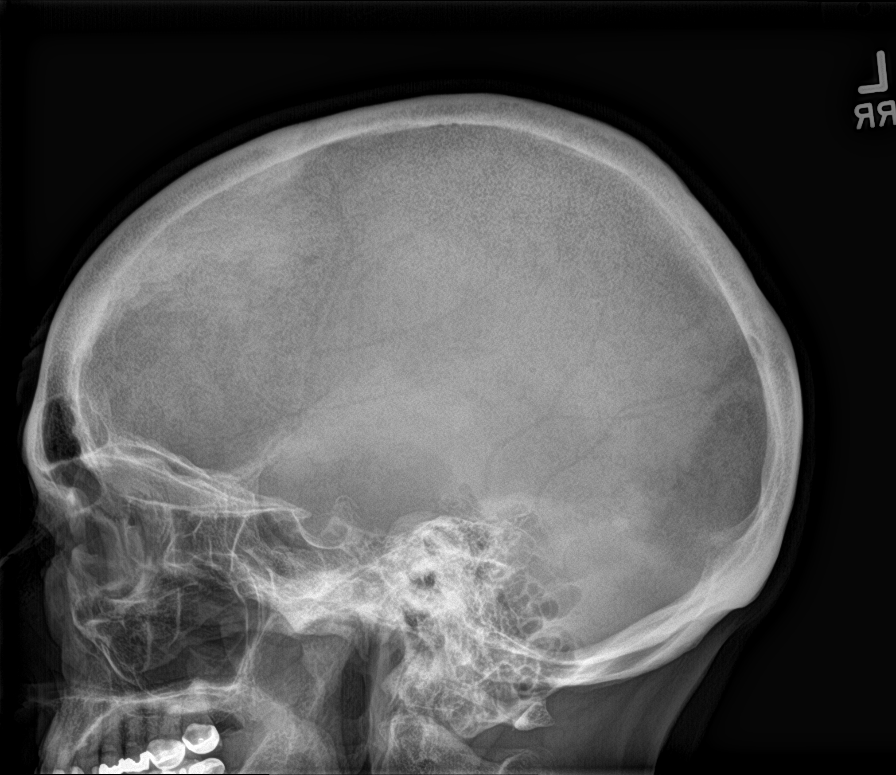

[c-spine ap]
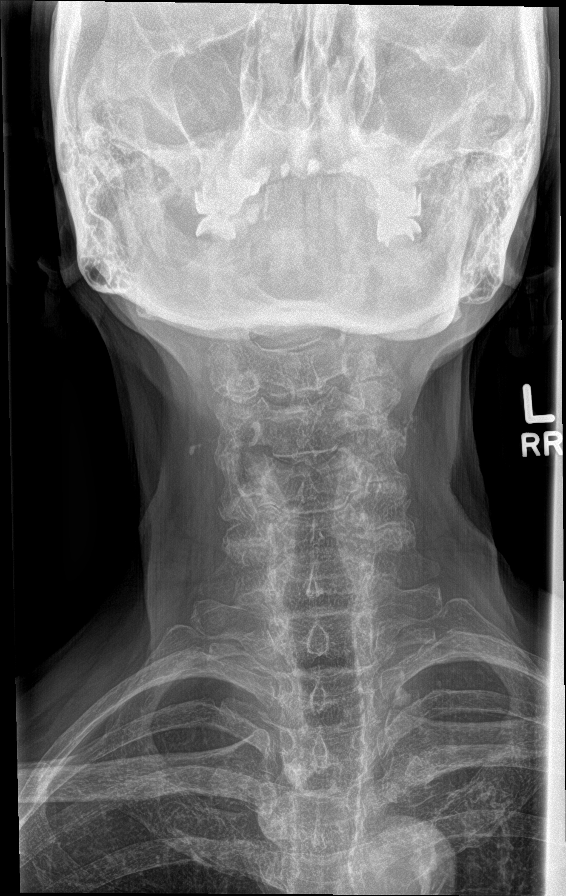

[t-spine ap]
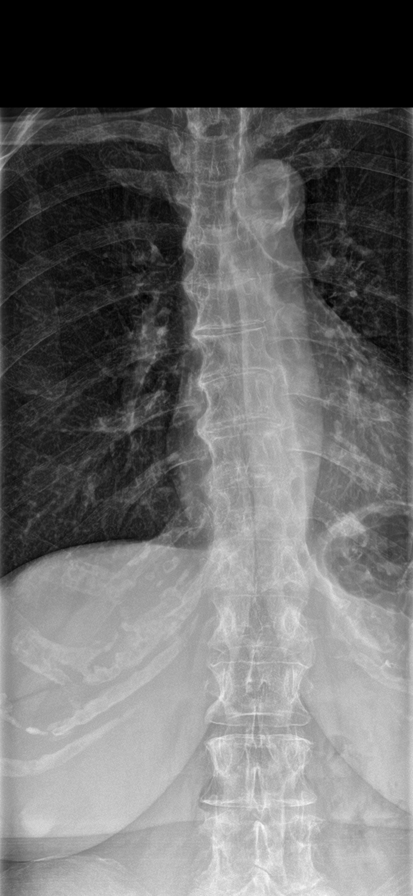

[t-spine lat]
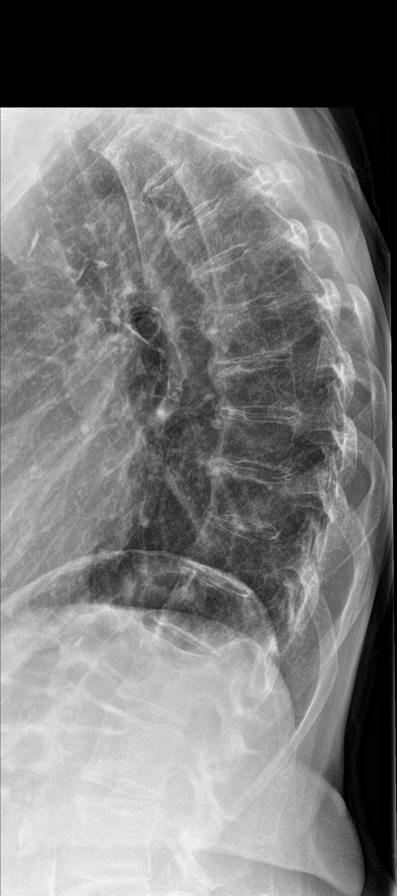

[shoulder ap (1 of 2)]
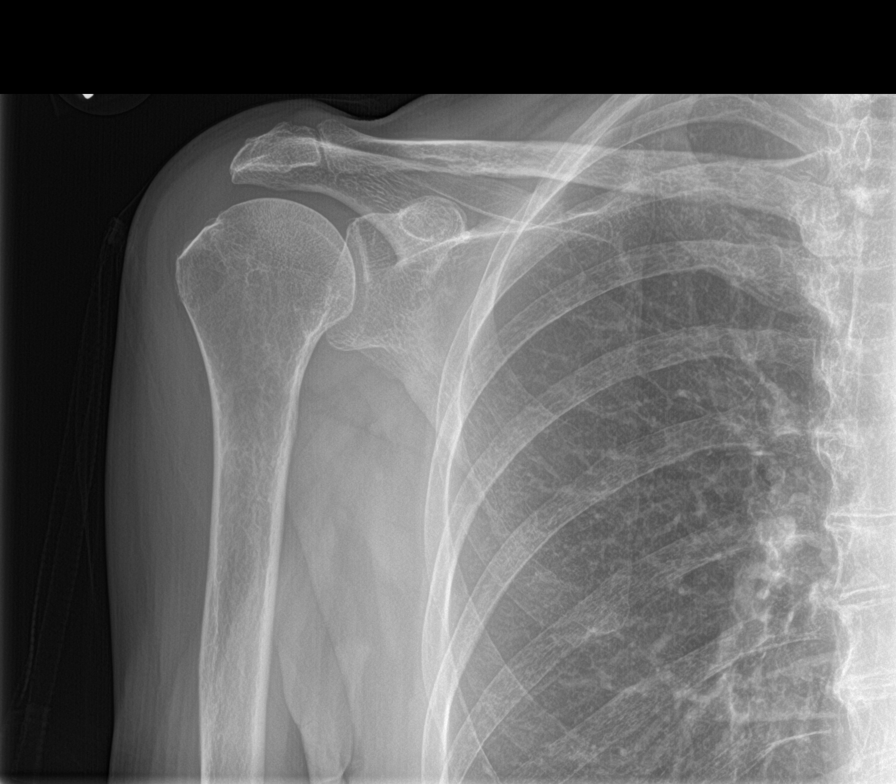

[shoulder ap (2 of 2)]
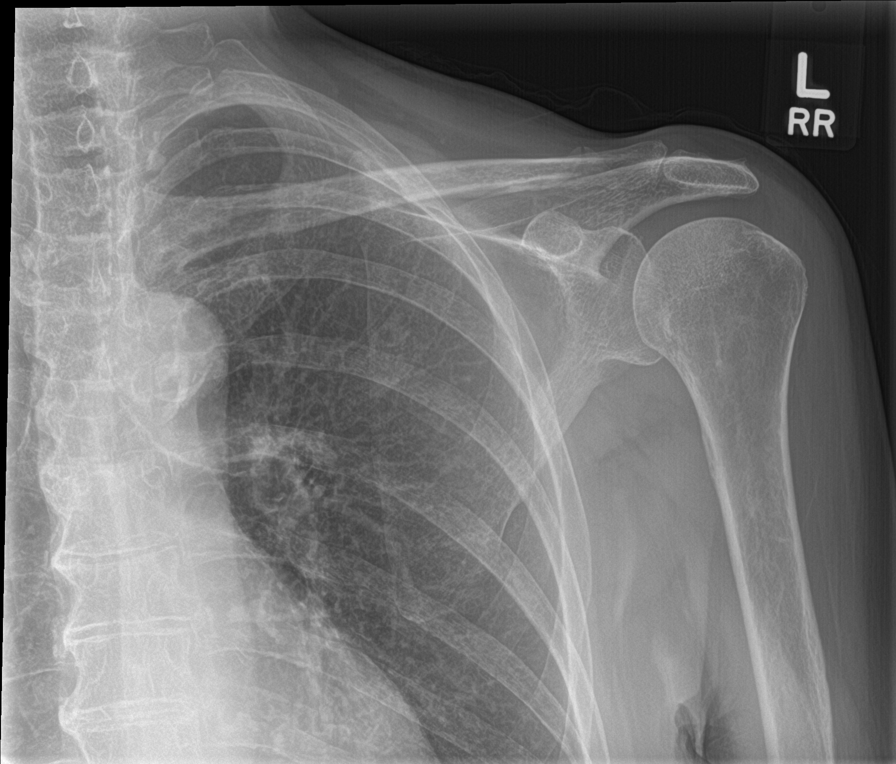

[humerus ap (1 of 2)]
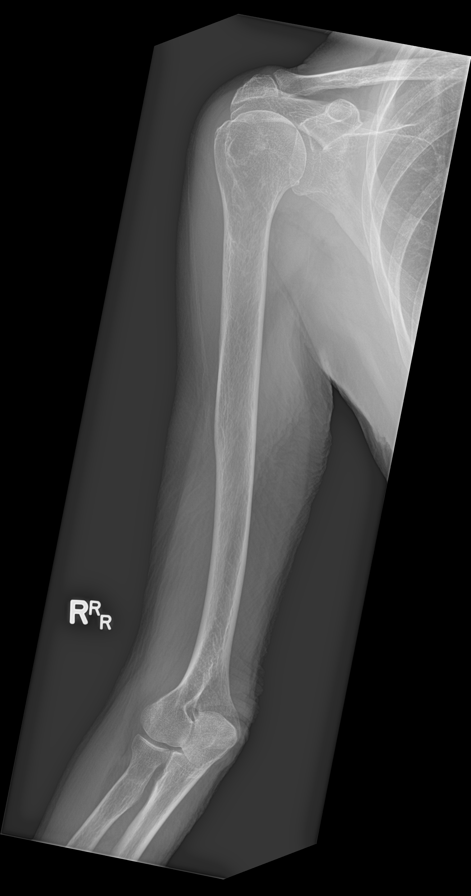

[humerus ap (2 of 2)]
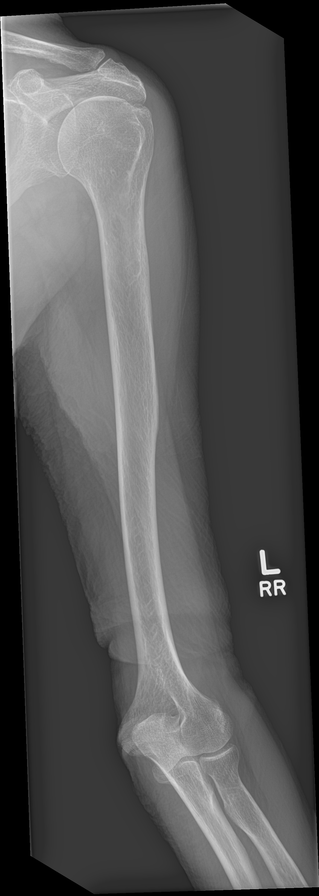

[10 of 10 positions shown; findings below may reference images not displayed]

FINDINGS: No focal lytic or blastic bone lesions.

Degenerative changes are seen within the cervical spine, thoracic
spine and lumbosacral junction. Vascular calcifications are noted
within the pelvis and medial thighs.
IMPRESSION: No focal lytic or blastic bone lesions.

## 2023-04-15 DIAGNOSIS — M6281 Muscle weakness (generalized): Secondary | ICD-10-CM | POA: Diagnosis not present

## 2023-04-15 DIAGNOSIS — M4726 Other spondylosis with radiculopathy, lumbar region: Secondary | ICD-10-CM | POA: Diagnosis not present

## 2023-04-20 DIAGNOSIS — M4726 Other spondylosis with radiculopathy, lumbar region: Secondary | ICD-10-CM | POA: Diagnosis not present

## 2023-04-20 DIAGNOSIS — M6281 Muscle weakness (generalized): Secondary | ICD-10-CM | POA: Diagnosis not present

## 2023-04-25 DIAGNOSIS — M6281 Muscle weakness (generalized): Secondary | ICD-10-CM | POA: Diagnosis not present

## 2023-04-25 DIAGNOSIS — M4726 Other spondylosis with radiculopathy, lumbar region: Secondary | ICD-10-CM | POA: Diagnosis not present

## 2023-04-27 DIAGNOSIS — M4726 Other spondylosis with radiculopathy, lumbar region: Secondary | ICD-10-CM | POA: Diagnosis not present

## 2023-04-27 DIAGNOSIS — M6281 Muscle weakness (generalized): Secondary | ICD-10-CM | POA: Diagnosis not present

## 2023-05-04 DIAGNOSIS — M6281 Muscle weakness (generalized): Secondary | ICD-10-CM | POA: Diagnosis not present

## 2023-05-04 DIAGNOSIS — M4726 Other spondylosis with radiculopathy, lumbar region: Secondary | ICD-10-CM | POA: Diagnosis not present

## 2023-05-05 DIAGNOSIS — K148 Other diseases of tongue: Secondary | ICD-10-CM | POA: Diagnosis not present

## 2023-05-05 DIAGNOSIS — R930 Abnormal findings on diagnostic imaging of skull and head, not elsewhere classified: Secondary | ICD-10-CM | POA: Diagnosis not present

## 2023-05-05 DIAGNOSIS — J351 Hypertrophy of tonsils: Secondary | ICD-10-CM | POA: Diagnosis not present

## 2023-05-05 DIAGNOSIS — M47812 Spondylosis without myelopathy or radiculopathy, cervical region: Secondary | ICD-10-CM | POA: Diagnosis not present

## 2023-05-10 DIAGNOSIS — M4726 Other spondylosis with radiculopathy, lumbar region: Secondary | ICD-10-CM | POA: Diagnosis not present

## 2023-05-10 DIAGNOSIS — M6281 Muscle weakness (generalized): Secondary | ICD-10-CM | POA: Diagnosis not present

## 2023-05-11 DIAGNOSIS — M4726 Other spondylosis with radiculopathy, lumbar region: Secondary | ICD-10-CM | POA: Diagnosis not present

## 2023-05-11 DIAGNOSIS — M6281 Muscle weakness (generalized): Secondary | ICD-10-CM | POA: Diagnosis not present

## 2023-05-13 DIAGNOSIS — Z882 Allergy status to sulfonamides status: Secondary | ICD-10-CM | POA: Diagnosis not present

## 2023-05-13 DIAGNOSIS — R799 Abnormal finding of blood chemistry, unspecified: Secondary | ICD-10-CM | POA: Diagnosis not present

## 2023-05-13 DIAGNOSIS — K148 Other diseases of tongue: Secondary | ICD-10-CM | POA: Diagnosis not present

## 2023-05-13 DIAGNOSIS — Z87891 Personal history of nicotine dependence: Secondary | ICD-10-CM | POA: Diagnosis not present

## 2023-05-13 DIAGNOSIS — C9001 Multiple myeloma in remission: Secondary | ICD-10-CM | POA: Diagnosis not present

## 2023-05-13 DIAGNOSIS — Z7901 Long term (current) use of anticoagulants: Secondary | ICD-10-CM | POA: Diagnosis not present

## 2023-05-13 DIAGNOSIS — Z792 Long term (current) use of antibiotics: Secondary | ICD-10-CM | POA: Diagnosis not present

## 2023-05-13 DIAGNOSIS — C9000 Multiple myeloma not having achieved remission: Secondary | ICD-10-CM | POA: Diagnosis not present

## 2023-05-13 DIAGNOSIS — I10 Essential (primary) hypertension: Secondary | ICD-10-CM | POA: Diagnosis not present

## 2023-05-17 DIAGNOSIS — M25571 Pain in right ankle and joints of right foot: Secondary | ICD-10-CM | POA: Diagnosis not present

## 2023-05-18 DIAGNOSIS — M6281 Muscle weakness (generalized): Secondary | ICD-10-CM | POA: Diagnosis not present

## 2023-05-18 DIAGNOSIS — M4726 Other spondylosis with radiculopathy, lumbar region: Secondary | ICD-10-CM | POA: Diagnosis not present

## 2023-05-18 NOTE — Progress Notes (Signed)
 Attempted to call patient back regarding her DEXA scan but unable to reach her, left a VM and asked her to call back.

## 2023-05-20 DIAGNOSIS — M6281 Muscle weakness (generalized): Secondary | ICD-10-CM | POA: Diagnosis not present

## 2023-05-20 DIAGNOSIS — M4726 Other spondylosis with radiculopathy, lumbar region: Secondary | ICD-10-CM | POA: Diagnosis not present

## 2023-05-23 ENCOUNTER — Other Ambulatory Visit (HOSPITAL_BASED_OUTPATIENT_CLINIC_OR_DEPARTMENT_OTHER): Payer: Self-pay | Admitting: Internal Medicine

## 2023-05-23 DIAGNOSIS — M6281 Muscle weakness (generalized): Secondary | ICD-10-CM | POA: Diagnosis not present

## 2023-05-23 DIAGNOSIS — Z780 Asymptomatic menopausal state: Secondary | ICD-10-CM

## 2023-05-23 DIAGNOSIS — M4726 Other spondylosis with radiculopathy, lumbar region: Secondary | ICD-10-CM | POA: Diagnosis not present

## 2023-05-25 DIAGNOSIS — M6281 Muscle weakness (generalized): Secondary | ICD-10-CM | POA: Diagnosis not present

## 2023-05-25 DIAGNOSIS — M4726 Other spondylosis with radiculopathy, lumbar region: Secondary | ICD-10-CM | POA: Diagnosis not present

## 2023-06-06 DIAGNOSIS — M4726 Other spondylosis with radiculopathy, lumbar region: Secondary | ICD-10-CM | POA: Diagnosis not present

## 2023-06-06 DIAGNOSIS — M6281 Muscle weakness (generalized): Secondary | ICD-10-CM | POA: Diagnosis not present

## 2023-06-07 ENCOUNTER — Encounter: Payer: Self-pay | Admitting: Cardiology

## 2023-06-07 ENCOUNTER — Ambulatory Visit: Payer: Medicare PPO | Attending: Cardiology | Admitting: Cardiology

## 2023-06-07 VITALS — BP 118/74 | HR 67 | Ht 62.0 in | Wt 104.6 lb

## 2023-06-07 DIAGNOSIS — I2510 Atherosclerotic heart disease of native coronary artery without angina pectoris: Secondary | ICD-10-CM | POA: Diagnosis not present

## 2023-06-07 DIAGNOSIS — I2583 Coronary atherosclerosis due to lipid rich plaque: Secondary | ICD-10-CM

## 2023-06-07 DIAGNOSIS — I4819 Other persistent atrial fibrillation: Secondary | ICD-10-CM

## 2023-06-07 MED ORDER — METOPROLOL SUCCINATE ER 25 MG PO TB24: 25.0000 mg | ORAL_TABLET | Freq: Every day | ORAL | 3 refills | Status: AC

## 2023-06-07 MED ORDER — AMLODIPINE BESYLATE 5 MG PO TABS: 5.0000 mg | ORAL_TABLET | Freq: Every day | ORAL | 3 refills | Status: AC

## 2023-06-07 MED ORDER — APIXABAN 2.5 MG PO TABS
2.5000 mg | ORAL_TABLET | Freq: Two times a day (BID) | ORAL | 5 refills | Status: DC
Start: 2023-06-07 — End: 2024-01-05

## 2023-06-07 NOTE — Patient Instructions (Signed)
 Medication Instructions:  The current medical regimen is effective;  continue present plan and medications.  *If you need a refill on your cardiac medications before your next appointment, please call your pharmacy*  Follow-Up: At [facility], you and your health needs are our priority.  As part of our continuing mission to provide you with exceptional heart care, our providers are all part of one team.  This team includes your primary Cardiologist (physician) and Advanced Practice Providers or APPs (Physician Assistants and Nurse Practitioners) who all work together to provide you with the care you need, when you need it.  Your next appointment:   1 year(s)  Provider:   One of our Advanced Practice Providers (APPs): Arabella Merles, PA-C  Joni Reining, NP Edd Fabian, NP  Jacolyn Reedy, PA-C Jari Favre, PA-C  Cabery, PA-C Robin Searing, NP  Azalee Course, PA-C Lee, PA-C  Bernadene Person, NP    Ronie Spies, PA-C  Evlyn Clines, PA-C Rise Paganini, NP Tereso Newcomer, PA-C Marjie Skiff, PA-C  [place], NP Yvonna Alanis, PA-C  Perlie Gold, PA-C Robet Leu, PA-C Cyndi Bender, NP      We recommend signing up for the patient portal called "MyChart".  Sign up information is provided on this After Visit Summary.  MyChart is used to connect with patients for Virtual Visits (Telemedicine).  Patients are able to view lab/test results, encounter notes, upcoming appointments, etc.  Non-urgent messages can be sent to your provider as well.   To learn more about what you can do with MyChart, go to [email or web address].        1st Floor: - Lobby - Registration  - Pharmacy  - Lab - Cafe  2nd Floor: - PV Lab - Diagnostic Testing (echo, CT, nuclear med)  3rd Floor: - Vacant  4th Floor: - TCTS (cardiothoracic surgery) - AFib Clinic - Structural Heart Clinic - Vascular Surgery  - Vascular Ultrasound  5th Floor: - HeartCare Cardiology (general and EP) -  Clinical Pharmacy for coumadin, hypertension, lipid, weight-loss medications, and med management appointments    Valet parking services will be available as well.

## 2023-06-07 NOTE — Progress Notes (Signed)
 Cardiology Office Note:  .   Date:  06/07/2023  ID:  Vilma Greathouse, DOB [birth year], MRN [id number] PCP: Arva Lathe, MD  [facility] Providers Cardiologist:  Dorothye Gathers, MD     History of Present Illness: .   TANIKA BRACCO is a 83 y.o. female Discussed the use of AI scribe software for clinical note transcription with the patient, who gave verbal consent to proceed.  History of Present Illness Ariel Holmes is an 83 year old female with long-standing persistent atrial fibrillation who presents for follow-up.  She has long-standing persistent atrial fibrillation and is currently on Eliquis  2.5 mg twice a day and metoprolol  at a low dose twice a day. She is relatively asymptomatic with her atrial fibrillation, although she occasionally notices it at night when she has to take a deep breath. Her prior EKG in 2024 showed atrial fibrillation with rate control. An echocardiogram in 2023 showed an ejection fraction of 60%, mild mitral valve regurgitation, and moderate mitral annular calcification.  She has a history of heavy coronary artery atherosclerosis and aortic atherosclerosis. Her LDL was 56, and she is not currently on any cholesterol-lowering medication. She has no chest pain or other major symptoms related to her coronary artery disease.  She has been treated for multiple myeloma and is under the care of [facility]. She completed two years of chemotherapy and is currently in stable control. She is on Revlimid and Zemaira, and her light chains have decreased. She has regular check-ups every three months, and her recent check-up showed stable disease control.  She reports having sciatic nerve pain since February, which affects her ability to walk. Before February, she was able to walk three to five miles a day, but now she experiences pain in the foot and back of the leg, especially when walking outside. She is currently undergoing physical therapy and has been to see  Dr. Marland Silvas. Short walks before the pain becomes severe are manageable, and she is working on balance exercises in physical therapy.    Studies Reviewed: Aaron Aas   EKG Interpretation Date/Time:  Tuesday June 07 2023 10:12:20 EDT Ventricular Rate:  82 PR Interval:    QRS Duration:  74 QT Interval:  370 QTC Calculation: 432 R Axis:   -45  Text Interpretation: Atrial fibrillation with premature ventricular or aberrantly conducted complexes Left axis deviation Nonspecific ST abnormality When compared with ECG of 03-Sep-2021 11:54, Nonspecific T wave abnormality, improved in Inferior leads Nonspecific T wave abnormality no longer evident in Lateral leads Confirmed by Dorothye Gathers ([id number]) on 06/07/2023 10:14:26 AM    Results LABS Hb: 13.9 PLT: 227 LDL: 56 Cr: 0.7 Hb: 12.3 (69/62/9528)  DIAGNOSTIC Echo: EF 60%, mild mitral valve regurgitation, moderate MAC (2023) EKG: AFib with rate control (2024) Risk Assessment/Calculations:            Physical Exam:   VS:  BP 118/74   Pulse 67   Ht 5\' 2"  (1.575 m)   Wt 104 lb 9.6 oz (47.4 kg)   LMP 02/16/1988 (LMP Unknown)   SpO2 96%   BMI 19.13 kg/m    Wt Readings from Last 3 Encounters:  06/07/23 104 lb 9.6 oz (47.4 kg)  02/15/23 105 lb 3.2 oz (47.7 kg)  06/09/22 107 lb (48.5 kg)    GEN: Well nourished, well developed in no acute distress NECK: No JVD; No carotid bruits CARDIAC: Irreg, no murmurs, no rubs, no gallops RESPIRATORY:  Clear to auscultation without rales, wheezing  or rhonchi  ABDOMEN: Soft, non-tender, non-distended EXTREMITIES:  No edema; No deformity   ASSESSMENT AND PLAN: .    Assessment and Plan Assessment & Plan Persistent atrial fibrillation Long-standing persistent atrial fibrillation, currently well-controlled and asymptomatic. Prior EKG showed AFib with rate control. Cardioversion is not indicated due to the persistent nature of AFib. - Continue Eliquis  2.5 mg orally twice a day. - Continue metoprolol  succinate  37.5 mg orally daily.  Coronary artery disease due to lipid rich plaque Heavy coronary artery atherosclerosis noted on prior scans. She is asymptomatic with no chest pain. LDL is well-controlled at 56 mg/dL, and overall cholesterol is 150 mg/dL. No current need for statin therapy due to lack of symptoms and good cholesterol control. Discussed potential use of Crestor but she is not interested.   Hypertension Hypertension is well-managed with medication. Blood pressure control appears stable. - Continue amlodipine  5 mg orally daily. - Continue metoprolol  succinate 25 mg orally daily. OK to continue instead of 37.5 mg  Multiple myeloma Multiple myeloma is under control. She is under the care of an oncologist at [facility] and is on Revlimid. Recent checkup showed stable disease with decreased light chains.  Sciatica Sciatica since February, affecting the foot and back of the leg, limiting ability to walk long distances. She is undergoing physical therapy and reports improvement with short walks. - Continue physical therapy. - Encourage short walks as tolerated.          Signed, Dorothye Gathers, MD

## 2023-06-08 DIAGNOSIS — M6281 Muscle weakness (generalized): Secondary | ICD-10-CM | POA: Diagnosis not present

## 2023-06-08 DIAGNOSIS — M4726 Other spondylosis with radiculopathy, lumbar region: Secondary | ICD-10-CM | POA: Diagnosis not present

## 2023-06-09 ENCOUNTER — Ambulatory Visit (HOSPITAL_BASED_OUTPATIENT_CLINIC_OR_DEPARTMENT_OTHER)
Admission: RE | Admit: 2023-06-09 | Discharge: 2023-06-09 | Disposition: A | Discharge status: Home / Self Care | Source: Ambulatory Visit | Attending: Internal Medicine | Admitting: Internal Medicine

## 2023-06-09 DIAGNOSIS — Z780 Asymptomatic menopausal state: Secondary | ICD-10-CM | POA: Diagnosis not present

## 2023-06-09 DIAGNOSIS — M8580 Other specified disorders of bone density and structure, unspecified site: Secondary | ICD-10-CM | POA: Diagnosis not present

## 2023-06-09 DIAGNOSIS — M85851 Other specified disorders of bone density and structure, right thigh: Secondary | ICD-10-CM | POA: Diagnosis not present

## 2023-06-09 DIAGNOSIS — M85852 Other specified disorders of bone density and structure, left thigh: Secondary | ICD-10-CM | POA: Diagnosis not present

## 2023-06-13 DIAGNOSIS — M4726 Other spondylosis with radiculopathy, lumbar region: Secondary | ICD-10-CM | POA: Diagnosis not present

## 2023-06-13 DIAGNOSIS — M6281 Muscle weakness (generalized): Secondary | ICD-10-CM | POA: Diagnosis not present

## 2023-06-16 DIAGNOSIS — Z85820 Personal history of malignant melanoma of skin: Secondary | ICD-10-CM | POA: Diagnosis not present

## 2023-06-16 DIAGNOSIS — L820 Inflamed seborrheic keratosis: Secondary | ICD-10-CM | POA: Diagnosis not present

## 2023-06-20 DIAGNOSIS — M6281 Muscle weakness (generalized): Secondary | ICD-10-CM | POA: Diagnosis not present

## 2023-06-20 DIAGNOSIS — M4726 Other spondylosis with radiculopathy, lumbar region: Secondary | ICD-10-CM | POA: Diagnosis not present

## 2023-06-22 DIAGNOSIS — M6281 Muscle weakness (generalized): Secondary | ICD-10-CM | POA: Diagnosis not present

## 2023-06-22 DIAGNOSIS — M4726 Other spondylosis with radiculopathy, lumbar region: Secondary | ICD-10-CM | POA: Diagnosis not present

## 2023-06-27 DIAGNOSIS — M6281 Muscle weakness (generalized): Secondary | ICD-10-CM | POA: Diagnosis not present

## 2023-06-27 DIAGNOSIS — M4726 Other spondylosis with radiculopathy, lumbar region: Secondary | ICD-10-CM | POA: Diagnosis not present

## 2023-06-29 DIAGNOSIS — M6281 Muscle weakness (generalized): Secondary | ICD-10-CM | POA: Diagnosis not present

## 2023-06-29 DIAGNOSIS — M4726 Other spondylosis with radiculopathy, lumbar region: Secondary | ICD-10-CM | POA: Diagnosis not present

## 2023-07-04 DIAGNOSIS — M4726 Other spondylosis with radiculopathy, lumbar region: Secondary | ICD-10-CM | POA: Diagnosis not present

## 2023-07-04 DIAGNOSIS — M6281 Muscle weakness (generalized): Secondary | ICD-10-CM | POA: Diagnosis not present

## 2023-07-06 DIAGNOSIS — M4726 Other spondylosis with radiculopathy, lumbar region: Secondary | ICD-10-CM | POA: Diagnosis not present

## 2023-07-06 DIAGNOSIS — M6281 Muscle weakness (generalized): Secondary | ICD-10-CM | POA: Diagnosis not present

## 2023-07-07 ENCOUNTER — Other Ambulatory Visit: Payer: Self-pay | Admitting: Orthopedic Surgery

## 2023-07-07 DIAGNOSIS — M5416 Radiculopathy, lumbar region: Secondary | ICD-10-CM

## 2023-07-13 DIAGNOSIS — M4726 Other spondylosis with radiculopathy, lumbar region: Secondary | ICD-10-CM | POA: Diagnosis not present

## 2023-07-13 DIAGNOSIS — M6281 Muscle weakness (generalized): Secondary | ICD-10-CM | POA: Diagnosis not present

## 2023-07-22 ENCOUNTER — Ambulatory Visit
Admission: RE | Admit: 2023-07-22 | Discharge: 2023-07-22 | Disposition: A | Discharge status: Home / Self Care | Source: Ambulatory Visit | Attending: Orthopedic Surgery | Admitting: Orthopedic Surgery

## 2023-07-22 DIAGNOSIS — I480 Paroxysmal atrial fibrillation: Secondary | ICD-10-CM | POA: Diagnosis not present

## 2023-07-22 DIAGNOSIS — M5416 Radiculopathy, lumbar region: Secondary | ICD-10-CM

## 2023-07-22 DIAGNOSIS — M48061 Spinal stenosis, lumbar region without neurogenic claudication: Secondary | ICD-10-CM | POA: Diagnosis not present

## 2023-07-22 DIAGNOSIS — E039 Hypothyroidism, unspecified: Secondary | ICD-10-CM | POA: Diagnosis not present

## 2023-07-22 DIAGNOSIS — M5431 Sciatica, right side: Secondary | ICD-10-CM | POA: Diagnosis not present

## 2023-07-22 DIAGNOSIS — M5126 Other intervertebral disc displacement, lumbar region: Secondary | ICD-10-CM | POA: Diagnosis not present

## 2023-07-22 DIAGNOSIS — C9000 Multiple myeloma not having achieved remission: Secondary | ICD-10-CM | POA: Diagnosis not present

## 2023-07-22 DIAGNOSIS — I10 Essential (primary) hypertension: Secondary | ICD-10-CM | POA: Diagnosis not present

## 2023-07-25 DIAGNOSIS — M4726 Other spondylosis with radiculopathy, lumbar region: Secondary | ICD-10-CM | POA: Diagnosis not present

## 2023-07-25 DIAGNOSIS — M6281 Muscle weakness (generalized): Secondary | ICD-10-CM | POA: Diagnosis not present

## 2023-07-25 NOTE — Progress Notes (Addendum)
 I spoke with the patient over the phone to discuss the updated Informed Consent (version date 05/24/23) for study [id number]. The updates to the protocol were reviewed, including discussion of risks and benefits, that the treatment involves research (if applicable), review of charges covered/not covered by study, medications/treatments used (if applicable), procedures, confidentiality, time commitments involved, study contact list, the option to withdraw at any time and required use of birth control (if applicable). Alternatives to continued study participation were discussed.   The patient had ample time to consider participation in the study, in the absence of coercion or undue influence. Patient was offered an opportunity to ask questions and these questions were answered to their satisfaction.  Patient verbalized understanding of information presented. The patient has signed and dated the following documents over the phone: Informed Consent (version date 05/24/2023)  Signed and dated copies of all consent documents were given to the patient. Every effort to maintain confidentiality will be employed.    Patient has signed and dated the form and will mail the form back to the study site for coordinator to add their signature.

## 2023-07-27 DIAGNOSIS — M6281 Muscle weakness (generalized): Secondary | ICD-10-CM | POA: Diagnosis not present

## 2023-07-27 DIAGNOSIS — M4726 Other spondylosis with radiculopathy, lumbar region: Secondary | ICD-10-CM | POA: Diagnosis not present

## 2023-07-29 DIAGNOSIS — Z79890 Hormone replacement therapy (postmenopausal): Secondary | ICD-10-CM | POA: Diagnosis not present

## 2023-07-29 DIAGNOSIS — I10 Essential (primary) hypertension: Secondary | ICD-10-CM | POA: Diagnosis not present

## 2023-07-29 DIAGNOSIS — C9000 Multiple myeloma not having achieved remission: Secondary | ICD-10-CM | POA: Diagnosis not present

## 2023-07-29 DIAGNOSIS — R933 Abnormal findings on diagnostic imaging of other parts of digestive tract: Secondary | ICD-10-CM | POA: Diagnosis not present

## 2023-07-29 DIAGNOSIS — R9389 Abnormal findings on diagnostic imaging of other specified body structures: Secondary | ICD-10-CM | POA: Diagnosis not present

## 2023-07-29 DIAGNOSIS — R799 Abnormal finding of blood chemistry, unspecified: Secondary | ICD-10-CM | POA: Diagnosis not present

## 2023-07-29 DIAGNOSIS — Z79899 Other long term (current) drug therapy: Secondary | ICD-10-CM | POA: Diagnosis not present

## 2023-07-29 DIAGNOSIS — Z87891 Personal history of nicotine dependence: Secondary | ICD-10-CM | POA: Diagnosis not present

## 2023-07-29 DIAGNOSIS — Z7901 Long term (current) use of anticoagulants: Secondary | ICD-10-CM | POA: Diagnosis not present

## 2023-08-01 DIAGNOSIS — M6281 Muscle weakness (generalized): Secondary | ICD-10-CM | POA: Diagnosis not present

## 2023-08-01 DIAGNOSIS — M4726 Other spondylosis with radiculopathy, lumbar region: Secondary | ICD-10-CM | POA: Diagnosis not present

## 2023-08-02 NOTE — Telephone Encounter (Signed)
 Copied from CRM #[id number]. Topic: Medication Refill - Medication Refill >> Aug 02, 2023  5:00 PM Harlene B wrote:   Hi,  Patient Ariel Holmes called requesting a medication refill for the following:   Medication: revlimid 5mg   Pharmacy: [facility]  PATIENT IS GOING OUT OF TOWN AND NEEDS PRESCRIPTION SENT IN SOONER SHE CAN GET IT BEFORE SHE LEAVES   The expected turnaround time is 3-4 business days   Thank you, Harlene [facility] [phone]

## 2023-08-03 DIAGNOSIS — M4726 Other spondylosis with radiculopathy, lumbar region: Secondary | ICD-10-CM | POA: Diagnosis not present

## 2023-08-03 DIAGNOSIS — M6281 Muscle weakness (generalized): Secondary | ICD-10-CM | POA: Diagnosis not present

## 2023-08-04 DIAGNOSIS — M4726 Other spondylosis with radiculopathy, lumbar region: Secondary | ICD-10-CM | POA: Diagnosis not present

## 2023-08-10 ENCOUNTER — Encounter (HOSPITAL_COMMUNITY)

## 2023-08-15 ENCOUNTER — Other Ambulatory Visit (HOSPITAL_COMMUNITY): Payer: Self-pay | Admitting: Orthopedic Surgery

## 2023-08-15 ENCOUNTER — Ambulatory Visit (HOSPITAL_COMMUNITY)
Admission: RE | Admit: 2023-08-15 | Discharge: 2023-08-15 | Disposition: A | Discharge status: Home / Self Care | Source: Ambulatory Visit | Attending: Vascular Surgery | Admitting: Vascular Surgery

## 2023-08-15 DIAGNOSIS — M7989 Other specified soft tissue disorders: Secondary | ICD-10-CM

## 2023-08-15 DIAGNOSIS — M79604 Pain in right leg: Secondary | ICD-10-CM

## 2023-08-15 LAB — VAS US ABI WITH/WO TBI
Left ABI: 0.7
Right ABI: 0.34

## 2023-08-16 ENCOUNTER — Other Ambulatory Visit (HOSPITAL_COMMUNITY): Payer: Self-pay | Admitting: Orthopedic Surgery

## 2023-08-16 DIAGNOSIS — R52 Pain, unspecified: Secondary | ICD-10-CM

## 2023-08-17 ENCOUNTER — Ambulatory Visit (HOSPITAL_COMMUNITY)
Admission: RE | Admit: 2023-08-17 | Discharge: 2023-08-17 | Disposition: A | Discharge status: Home / Self Care | Source: Ambulatory Visit | Attending: Vascular Surgery | Admitting: Vascular Surgery

## 2023-08-17 DIAGNOSIS — M79671 Pain in right foot: Secondary | ICD-10-CM | POA: Diagnosis not present

## 2023-08-17 DIAGNOSIS — R52 Pain, unspecified: Secondary | ICD-10-CM | POA: Insufficient documentation

## 2023-09-08 ENCOUNTER — Encounter: Payer: Self-pay | Admitting: Vascular Surgery

## 2023-09-08 ENCOUNTER — Ambulatory Visit: Attending: Vascular Surgery | Admitting: Vascular Surgery

## 2023-09-08 VITALS — BP 118/72 | HR 68 | Temp 98.2°F | Resp 16 | Ht 62.0 in | Wt 104.2 lb

## 2023-09-08 DIAGNOSIS — I739 Peripheral vascular disease, unspecified: Secondary | ICD-10-CM

## 2023-09-08 MED ORDER — CILOSTAZOL 100 MG PO TABS: 100.0000 mg | ORAL_TABLET | Freq: Two times a day (BID) | ORAL | 11 refills | Status: AC

## 2023-09-08 NOTE — Progress Notes (Signed)
 Office Note     CC: Right lower extremity claudication Requesting Provider:  Shari Sieving, MD  HPI: Ariel Holmes is a 83 y.o. ([birth year]) female presenting at the request of .Varadarajan, Rupashree, MD right lower extremity claudication.  On exam, Ariel Holmes was doing well.  A native of [place], she has lived in several pounds prior to settling in [place] in 1977 with her husband.  He was involved in textiles prior to textiles being shipped overseas.  Ariel Holmes continues to live a very active lifestyle.  In January, she was ambulating 3 to 5 miles daily with a group of women in her community.  That has since decreased significantly, due to pain in the right lower extremity with ambulation.  She describes the pain as calf cramping and noted that the pain was so intense in February, she was having rest pain in the foot.  The rest pain has resolved.  She notes that she can walk roughly 2 blocks without significant pain, but if she has a cart, she can walk around all of [employer] with no problems.  Furthermore, she continues to workout daily, spending 1 to 2 hours in the pool ambulating stating that this helps her leg dramatically.  Denies current rest pain, denies wounds. Patient has a history of multiple myeloma which treated at [facility].  She is currently part of a study, and cannot take any medications without approval from her oncologist.  Medications include Eliquis .  She is not on a statin.  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/2011   Multiple myeloma (HCC)    followed at [facility]   MVP (mitral valve prolapse)    Osteopenia    Peripheral vascular disease (HCC)    Prolapsed uterus    Raynaud disease    Varicose vein     Past Surgical History:  Procedure Laterality Date   BREAST SURGERY      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 Patient does not remember exact date of surgery.   CATARACT EXTRACTION Right 10/25/2017   CESAREAN SECTION  1965   MELANOMA EXCISION  12/31/2010   Procedure: MELANOMA EXCISION;  Surgeon: Dann FORBES Hummer, MD;  Location: [facility];  Service: General;  Laterality: Left;  wide excision melanoma left thigh, excision lesion left thigh   OTHER SURGICAL HISTORY Right    Surgical repair of architectual distortion Right Breast- Hyperplasia   TONSILLECTOMY      Social History   Socioeconomic History   Marital status: Widowed    Spouse name: Environmental education officer   Number of children: Not on file   Years of education: Not on file   Highest education level: Not on file  Occupational History   Not on file  Tobacco Use   Smoking status: Former    Current packs/day: 0.00    Average packs/day: 1 pack/day for 50.4 years (50.4 ttl pk-yrs)    Types: Cigarettes    Start date: 03/08/1960    Quit date: 07/23/2010    Years since quitting: 13.1   Smokeless tobacco: Never   Tobacco comments:    Former smoker 07/29/21  Vaping Use   Vaping status: Never Used  Substance and Sexual Activity   Alcohol use: No    Alcohol/week: 0.0 standard drinks  of alcohol   Drug use: No   Sexual activity: Not Currently    Partners: Male    Birth control/protection: Post-menopausal  Other Topics Concern   Not on file  Social History Narrative   Not on file   Social Drivers of Health   Financial Resource Strain: Not on file  Food Insecurity: No Food Insecurity (05/13/2023)   Received from [facility]   Hunger Vital Sign    Within the past 12 months, you worried that your food would run out before you got the money to buy more.: Never true    Within the past 12 months, the food you bought just didn't last and you didn't have money to get more.: Never true  Transportation Needs: No Transportation Needs (05/13/2023)   Received from [facility]    Lack of Transportation (Medical): No    Lack of Transportation (Non-Medical): No  Physical Activity: Not on file  Stress: Not on file  Social Connections: Not on file  Intimate Partner Violence: Not on file   Family History  Problem Relation Age of Onset   Hypertension Mother    Congestive Heart Failure Mother    Thyroid  disease Mother    Hyperlipidemia Mother    Heart disease Mother    Stroke Father    Heart attack Father 106   Diabetes Paternal Grandmother    Diabetes Paternal Grandfather    Hypertension Maternal Grandfather    CVA Maternal Grandfather    Esophageal cancer Neg Hx    Rectal cancer Neg Hx    Stomach cancer Neg Hx    Colon cancer Neg Hx     Current Outpatient Medications  Medication Sig Dispense Refill   amLODipine  (NORVASC ) 5 MG tablet Take 1 tablet (5 mg total) by mouth daily. 90 tablet 3   apixaban  (ELIQUIS ) 2.5 MG TABS tablet Take 1 tablet (2.5 mg total) by mouth 2 (two) times daily. 60 tablet 5   Calcium Carbonate-Vitamin D  (CALCIUM + D PO) Take by mouth daily.     Carboxymethylcellulose Sod PF 0.5 % SOLN 1 drop.     Cholecalciferol (VITAMIN D ) 2000 UNITS CAPS Take 1 capsule by mouth daily.     fluticasone  (FLONASE ) 50 MCG/ACT nasal spray PLACE 2 SPRAYS INTO BOTH NOSTRILS AS NEEDED. 16 g 5   hydrocortisone  2.5 % cream APPLY TOPICALLY 3 (THREE) TIMES DAILY. USE AS NEEDED. 28 g 3   ISATUXIMAB-IRFC IV Inject into the vein every 14 (fourteen) days.     levothyroxine  (SYNTHROID , LEVOTHROID) 88 MCG tablet Take 1 tablet (88 mcg total) by mouth daily before breakfast. 30 tablet 11   LUTEIN PO Take 6 mg by mouth daily.     metoprolol  succinate (TOPROL -XL) 25 MG 24 hr tablet Take 1 tablet (25 mg total) by mouth daily. 90 tablet 3   Multiple Vitamin (MULTIVITAMIN PO) Take by mouth.     Multiple Vitamins-Minerals (PRESERVISION AREDS 2 PO) Take by mouth.     nicotine polacrilex (NICORETTE) 2 MG gum Take 2 mg by mouth as needed. Not smoking      NONFORMULARY OR COMPOUNDED ITEM Vaginal estradiol cream 0.02%.  1/2 gram per vagina at bedtime up to two times weekly.  Disp:  8 grams. 1 each 11   REVLIMID 5 MG capsule Take by mouth.     valACYclovir  (VALTREX ) 1000 MG tablet 2 tabs (2gm) x 1, repeat in 12 hours with symptom onset 30 tablet 2  Zoledronic  Acid (ZOMETA ) 4 MG/100ML IVPB Inject 4 mg into the vein every 3 (three) months.     No current facility-administered medications for this visit.    Allergies  Allergen Reactions   Ciprofloxacin  Other (See Comments)    Musculskeletal pain.   Codeine Nausea And Vomiting   Macrobid  [Nitrofurantoin ] Nausea Only and Other (See Comments)    Dizzy, headache,   Pholcodine Diarrhea   Sulfa  Antibiotics Nausea Only     REVIEW OF SYSTEMS:  [X]  denotes positive finding, [ ]  denotes negative finding Cardiac  Comments:  Chest pain or chest pressure:    Shortness of breath upon exertion:    Short of breath when lying flat:    Irregular heart rhythm:        Vascular    Pain in calf, thigh, or hip brought on by ambulation:    Pain in feet at night that wakes you up from your sleep:     Blood clot in your veins:    Leg swelling:         Pulmonary    Oxygen at home:    Productive cough:     Wheezing:         Neurologic    Sudden weakness in arms or legs:     Sudden numbness in arms or legs:     Sudden onset of difficulty speaking or slurred speech:    Temporary loss of vision in one eye:     Problems with dizziness:         Gastrointestinal    Blood in stool:     Vomited blood:         Genitourinary    Burning when urinating:     Blood in urine:        Psychiatric    Major depression:         Hematologic    Bleeding problems:    Problems with blood clotting too easily:        Skin    Rashes or ulcers:        Constitutional    Fever or chills:      PHYSICAL EXAMINATION:  Vitals:   09/08/23 0922  BP: 118/72  Pulse: 68  Resp: 16  Temp: 98.2 F (36.8 C)   TempSrc: Temporal  SpO2: 100%  Weight: 104 lb 3.2 oz (47.3 kg)  Height: 5' 2 (1.575 m)    General:  WDWN in NAD; vital signs documented above Gait: Not observed HENT: WNL, normocephalic Pulmonary: normal non-labored breathing , without wheezing Cardiac: regular HR Abdomen: soft, NT, no masses Skin: without rashes Vascular Exam/Pulses:  Right Left  Radial 2+ (normal) 2+ (normal)  Ulnar    Femoral    Popliteal    DP absent absent  PT     Extremities: without ischemic changes, without Gangrene , without cellulitis; without open wounds;  Telangiectasias appreciated on the feet bilaterally.  Some varicosities. Musculoskeletal: no muscle wasting or atrophy  Neurologic: A&O X 3;  No focal weakness or paresthesias are detected Psychiatric:  The pt has Normal affect.   Non-Invasive Vascular Imaging:     +-----------+--------+-----+--------+----------+-----------+  RIGHT     PSV cm/sRatioStenosisWaveform  Comments     +-----------+--------+-----+--------+----------+-----------+  CFA Prox   106                  triphasic              +-----------+--------+-----+--------+----------+-----------+  CFA Distal 123  triphasic              +-----------+--------+-----+--------+----------+-----------+  DFA       137                                         +-----------+--------+-----+--------+----------+-----------+  SFA Prox   147                  biphasic  prx to mid   +-----------+--------+-----+--------+----------+-----------+  SFA Mid    20           occluded                       +-----------+--------+-----+--------+----------+-----------+  SFA Distal 43/ m        occluded          collaterals  +-----------+--------+-----+--------+----------+-----------+  POP Prox   33                   monophasic             +-----------+--------+-----+--------+----------+-----------+  POP Distal 27                    monophasic             +-----------+--------+-----+--------+----------+-----------+  TP Trunk   24                   monophasic             +-----------+--------+-----+--------+----------+-----------+  ATA Distal 24                   monophasicdampened     +-----------+--------+-----+--------+----------+-----------+  PTA Distal                                NV           +-----------+--------+-----+--------+----------+-----------+  PERO Distal14                   monophasicdampened     +-----------+--------+-----+--------+----------+-----------+        +-----------+--------+-----+---------------+----------+--------+  LEFT      PSV cm/sRatioStenosis       Waveform  Comments  +-----------+--------+-----+---------------+----------+--------+  EIA Distal 104                         biphasic            +-----------+--------+-----+---------------+----------+--------+  CFA Prox   119                         triphasic           +-----------+--------+-----+---------------+----------+--------+  SFA Prox   321          50-74% stenosisbiphasic            +-----------+--------+-----+---------------+----------+--------+  SFA Mid    86                          monophasic          +-----------+--------+-----+---------------+----------+--------+  SFA Distal 41                          monophasicdampened  +-----------+--------+-----+---------------+----------+--------+  POP Prox   47  monophasicdampeed   +-----------+--------+-----+---------------+----------+--------+  POP Distal 28                          monophasicdampened  +-----------+--------+-----+---------------+----------+--------+  TP Trunk   50                          monophasic          +-----------+--------+-----+---------------+----------+--------+  ATA Distal                                       NV         +-----------+--------+-----+---------------+----------+--------+  PTA Distal                                       NV        +-----------+--------+-----+---------------+----------+--------+  PERO Distal13                          monophasicdampened  +-----------+--------+-----+---------------+----------+--------+   ABI Findings:  +---------+------------------+-----+-------------------+--------+  Right   Rt Pressure (mmHg)IndexWaveform           Comment   +---------+------------------+-----+-------------------+--------+  Brachial 135                                                 +---------+------------------+-----+-------------------+--------+  PTA     0                 0.00 Inaudible                    +---------+------------------+-----+-------------------+--------+  DP      50                0.34 dampened monophasic          +---------+------------------+-----+-------------------+--------+  Great Toe0                 0.00 Absent                       +---------+------------------+-----+-------------------+--------+   +---------+------------------+-----+-------------------+-------+  Left    Lt Pressure (mmHg)IndexWaveform           Comment  +---------+------------------+-----+-------------------+-------+  Brachial 147                                                +---------+------------------+-----+-------------------+-------+  PTA     82                0.56 dampened monophasic         +---------+------------------+-----+-------------------+-------+  DP      103               0.70 monophasic                  +---------+------------------+-----+-------------------+-------+  Great Toe70                0.48 Abnormal                    +---------+------------------+-----+-------------------+-------+  ASSESSMENT/PLAN: Ariel Holmes is a 83 y.o. female presenting with bilateral lower extremity claudication,  right greater than left.  Claudication symptoms are from peripheral arterial disease as well as a neurogenic component as she can walk further using a cart at [employer].  Regardless, Ariel Holmes does not have rest pain, nor does she have a wound.  She would be best treated with continued medical management and ambulation.  Her and I had a long discussion regarding the above, we discussed that the body naturally creates collaterals in an effort to help perfusion distally.    ABIs in the right leg are severely diminished with arterial duplex demonstrating SFA occlusion.  I am happy that she is not in rest pain nor does she have tissue loss, however I am very concerned that if she were to develop tissue loss, she would need revascularization in an effort to heal any wounds.  Her multiple myeloma complicates things as she has a higher risk of thrombosis.  Increasing her risk of future thrombosis as her small size, and subsequently the small size of her arteries.  At this time, she would be best treated with continued medical management.  We discussed continuing her current medication regimen, as well as initiating cilostazol .  She asked that I discuss cilostazol  with her oncologist Odella Jayson Cough, MD.  I asked that she continue to live an active lifestyle, and ambulate as much as possible.  I will make sure that he gets a faxed copy of this note.  I have ordered cilostazol  for her.  I asked her to hold on taking the medication until cleared as I do not want her to be pulled from the study she is currently on.    Fonda FORBES Rim, MD [facility] [phone]

## 2023-09-09 ENCOUNTER — Telehealth: Payer: Self-pay

## 2023-09-09 NOTE — Telephone Encounter (Signed)
 Pt called asking about taking the Pletal  Dr. Lanis ordered.  Per Dr. Aleta note, patient is not to take the Pletal  until she gets approval from Dr. Homer, [facility]. Patient knows not to take the Pletal .

## 2023-09-12 DIAGNOSIS — M5416 Radiculopathy, lumbar region: Secondary | ICD-10-CM | POA: Diagnosis not present

## 2023-09-20 ENCOUNTER — Encounter: Admitting: Vascular Surgery

## 2023-10-06 ENCOUNTER — Encounter: Admitting: Vascular Surgery

## 2023-10-11 DIAGNOSIS — C9000 Multiple myeloma not having achieved remission: Secondary | ICD-10-CM | POA: Diagnosis not present

## 2023-10-13 ENCOUNTER — Ambulatory Visit (INDEPENDENT_AMBULATORY_CARE_PROVIDER_SITE_OTHER)

## 2023-10-13 ENCOUNTER — Encounter (HOSPITAL_BASED_OUTPATIENT_CLINIC_OR_DEPARTMENT_OTHER): Payer: Self-pay

## 2023-10-13 ENCOUNTER — Other Ambulatory Visit (HOSPITAL_COMMUNITY)
Admission: RE | Admit: 2023-10-13 | Discharge: 2023-10-13 | Disposition: A | Discharge status: Home / Self Care | Source: Ambulatory Visit | Attending: Obstetrics & Gynecology | Admitting: Obstetrics & Gynecology

## 2023-10-13 VITALS — BP 130/90 | HR 72 | Wt 108.2 lb

## 2023-10-13 DIAGNOSIS — N898 Other specified noninflammatory disorders of vagina: Secondary | ICD-10-CM | POA: Diagnosis present

## 2023-10-13 DIAGNOSIS — R300 Dysuria: Secondary | ICD-10-CM | POA: Diagnosis not present

## 2023-10-13 LAB — POCT URINALYSIS DIPSTICK
Bilirubin, UA: NEGATIVE
Glucose, UA: NEGATIVE
Ketones, UA: NEGATIVE
Nitrite, UA: NEGATIVE
Protein, UA: NEGATIVE
Spec Grav, UA: <=1.005 — AB (ref 1.010–1.025)
Urobilinogen, UA: 0.2 U/dL
pH, UA: 5.5 (ref 5.0–8.0)

## 2023-10-13 NOTE — Progress Notes (Signed)
 NURSE VISIT- UTI SYMPTOMS   SUBJECTIVE:  Ariel Holmes is a 83 y.o. G64P1001 female here for UTI symptoms. She is a GYN patient. She reports dysuria.  OBJECTIVE:  BP (!) 130/90   Pulse 72   Wt 108 lb 3.2 oz (49.1 kg)   LMP 02/16/1988 (LMP Unknown)   BMI 19.79 kg/m   Appears well, in no apparent distress  No results found for this or any previous visit (from the past 24 hours).  ASSESSMENT: GYN patient with UTI symptoms and negative nitrites  PLAN: Visit routed to or discussed with:  Rx sent today: No Urine culture sent Call or return to clinic prn if these symptoms worsen or fail to improve as anticipated. Follow-up: as needed     NURSE VISIT- VAGINITIS/STD/POC  SUBJECTIVE:  Ariel Holmes is a 83 y.o. G1P1001 GYN patientfemale here for a vaginal swab for vaginitis screening.  She reports the following symptoms: vulvar itching for a few days. Denies abnormal vaginal bleeding, significant pelvic pain, fever, or UTI symptoms.  OBJECTIVE:  BP (!) 130/90   Pulse 72   Wt 108 lb 3.2 oz (49.1 kg)   LMP 02/16/1988 (LMP Unknown)   BMI 19.79 kg/m   Appears well, in no apparent distress  ASSESSMENT: Vaginal swab for vaginitis screening  PLAN: Self-collected vaginal probe for Bacterial Vaginosis, Yeast sent to lab Treatment: to be determined once results are received Follow-up as needed if symptoms persist/worsen, or new symptoms develop

## 2023-10-14 DIAGNOSIS — R300 Dysuria: Secondary | ICD-10-CM | POA: Diagnosis not present

## 2023-10-14 LAB — CERVICOVAGINAL ANCILLARY ONLY
Bacterial Vaginitis (gardnerella): NEGATIVE
Candida Glabrata: NEGATIVE
Candida Vaginitis: NEGATIVE
Comment: NEGATIVE
Comment: NEGATIVE
Comment: NEGATIVE

## 2023-10-18 ENCOUNTER — Telehealth (HOSPITAL_BASED_OUTPATIENT_CLINIC_OR_DEPARTMENT_OTHER): Payer: Self-pay | Admitting: Certified Nurse Midwife

## 2023-10-18 ENCOUNTER — Telehealth (HOSPITAL_BASED_OUTPATIENT_CLINIC_OR_DEPARTMENT_OTHER): Payer: Self-pay

## 2023-10-18 LAB — URINE CULTURE

## 2023-10-18 MED ORDER — AMOXICILLIN-POT CLAVULANATE 500-125 MG PO TABS: 500.0000 mg | ORAL_TABLET | Freq: Two times a day (BID) | ORAL | 0 refills | Status: DC

## 2023-10-18 NOTE — Telephone Encounter (Signed)
 Patient Message:  The patient came in on 10/13/23 for a urine sample due to concerns about a UTI.   She is waiting for her medication to be called in and is requesting a call back to discuss. Her preferred pharmacy is [facility] on [address].

## 2023-10-18 NOTE — Telephone Encounter (Addendum)
-----   Message from Ronal GORMAN Pinal sent at 10/18/2023  9:20 AM EDT ----- Regarding: UTI Pt came last week and left urine.  +Leuks.  Did a vaginitis swab as well. She called today.  She does have multiple allergies.  Please sent in rx for augmentin  500mg  bid x 5 days.  Culture is still pending.  Follow up will be planned once the results are finalized.  Thanks.   Spoke with patient. Advised of message from Dr.Miller. Patient verbalizes understanding. Rx for Augmentin  500 mg BID x 5 days sent to [facility] off [address].

## 2023-10-31 DIAGNOSIS — I739 Peripheral vascular disease, unspecified: Secondary | ICD-10-CM | POA: Diagnosis not present

## 2023-10-31 DIAGNOSIS — Z79899 Other long term (current) drug therapy: Secondary | ICD-10-CM | POA: Diagnosis not present

## 2023-10-31 DIAGNOSIS — Z0000 Encounter for general adult medical examination without abnormal findings: Secondary | ICD-10-CM | POA: Diagnosis not present

## 2023-10-31 DIAGNOSIS — I10 Essential (primary) hypertension: Secondary | ICD-10-CM | POA: Diagnosis not present

## 2023-10-31 DIAGNOSIS — E039 Hypothyroidism, unspecified: Secondary | ICD-10-CM | POA: Diagnosis not present

## 2023-10-31 DIAGNOSIS — C9000 Multiple myeloma not having achieved remission: Secondary | ICD-10-CM | POA: Diagnosis not present

## 2023-10-31 DIAGNOSIS — I700 Atherosclerosis of aorta: Secondary | ICD-10-CM | POA: Diagnosis not present

## 2023-11-07 NOTE — Progress Notes (Signed)
 SABRA

## 2023-12-08 ENCOUNTER — Ambulatory Visit: Attending: Vascular Surgery | Admitting: Physician Assistant

## 2023-12-08 VITALS — BP 107/68 | HR 83 | Temp 97.8°F | Wt 110.5 lb

## 2023-12-08 DIAGNOSIS — I739 Peripheral vascular disease, unspecified: Secondary | ICD-10-CM

## 2023-12-08 NOTE — Progress Notes (Signed)
 Office Note   History of Present Illness   Ariel Holmes is a 83 y.o. (08-29-1940) female who presents for follow-up.  She has a history of right lower extremity claudication that significantly worsened in February of this year.  She was only able to walk 1-2 blocks before developing significant right calf cramping.  Ultimately she was started on Pletal  for her symptoms, with approval from her oncologist.  She does have a history of multiple myeloma which is treated at [facility].  She returns today for follow-up.  She says that her symptoms have dramatically improved since she started Pletal .  She can now walk at least 1 mile before she develops mild right calf cramping/burning.  She usually stops when she walks to talk to her neighbors, and after resting for about 3 minutes her calf is back to normal.  She denies any extreme pain like she had before.  She denies any rest pain or tissue loss.  Current Outpatient Medications  Medication Sig Dispense Refill   amLODipine  (NORVASC ) 5 MG tablet Take 1 tablet (5 mg total) by mouth daily. 90 tablet 3   amoxicillin -clavulanate (AUGMENTIN ) 500-125 MG tablet Take 1 tablet by mouth in the morning and at bedtime. 10 tablet 0   apixaban  (ELIQUIS ) 2.5 MG TABS tablet Take 1 tablet (2.5 mg total) by mouth 2 (two) times daily. 60 tablet 5   Calcium Carbonate-Vitamin D  (CALCIUM + D PO) Take by mouth daily.     Carboxymethylcellulose Sod PF 0.5 % SOLN 1 drop.     Cholecalciferol (VITAMIN D ) 2000 UNITS CAPS Take 1 capsule by mouth daily.     cilostazol  (PLETAL ) 100 MG tablet Take 1 tablet (100 mg total) by mouth 2 (two) times daily before a meal. 60 tablet 11   fluticasone  (FLONASE ) 50 MCG/ACT nasal spray PLACE 2 SPRAYS INTO BOTH NOSTRILS AS NEEDED. 16 g 5   hydrocortisone  2.5 % cream APPLY TOPICALLY 3 (THREE) TIMES DAILY. USE AS NEEDED. 28 g 3   ISATUXIMAB-IRFC IV Inject into the vein every 14 (fourteen) days.     levothyroxine  (SYNTHROID , LEVOTHROID) 88 MCG tablet  Take 1 tablet (88 mcg total) by mouth daily before breakfast. 30 tablet 11   LUTEIN PO Take 6 mg by mouth daily.     metoprolol  succinate (TOPROL -XL) 25 MG 24 hr tablet Take 1 tablet (25 mg total) by mouth daily. 90 tablet 3   Multiple Vitamin (MULTIVITAMIN PO) Take by mouth.     Multiple Vitamins-Minerals (PRESERVISION AREDS 2 PO) Take by mouth.     nicotine polacrilex (NICORETTE) 2 MG gum Take 2 mg by mouth as needed. Not smoking     NONFORMULARY OR COMPOUNDED ITEM Vaginal estradiol cream 0.02%.  1/2 gram per vagina at bedtime up to two times weekly.  Disp:  8 grams. 1 each 11   REVLIMID 5 MG capsule Take by mouth.     valACYclovir  (VALTREX ) 1000 MG tablet 2 tabs (2gm) x 1, repeat in 12 hours with symptom onset 30 tablet 2   Zoledronic  Acid (ZOMETA ) 4 MG/100ML IVPB Inject 4 mg into the vein every 3 (three) months.     No current facility-administered medications for this visit.    REVIEW OF SYSTEMS (negative unless checked):   Cardiac:  []  Chest pain or chest pressure? []  Shortness of breath upon activity? []  Shortness of breath when lying flat? []  Irregular heart rhythm?  Vascular:  [x]  Pain in calf, thigh, or hip brought on by walking? []   Pain in feet at night that wakes you up from your sleep? []  Blood clot in your veins? []  Leg swelling?  Pulmonary:  []  Oxygen at home? []  Productive cough? []  Wheezing?  Neurologic:  []  Sudden weakness in arms or legs? []  Sudden numbness in arms or legs? []  Sudden onset of difficult speaking or slurred speech? []  Temporary loss of vision in one eye? []  Problems with dizziness?  Gastrointestinal:  []  Blood in stool? []  Vomited blood?  Genitourinary:  []  Burning when urinating? []  Blood in urine?  Psychiatric:  []  Major depression  Hematologic:  []  Bleeding problems? []  Problems with blood clotting?  Dermatologic:  []  Rashes or ulcers?  Constitutional:  []  Fever or chills?  Ear/Nose/Throat:  []  Change in hearing? []   Nose bleeds? []  Sore throat?  Musculoskeletal:  []  Back pain? []  Joint pain? []  Muscle pain?   Physical Examination   Vitals:   12/08/23 1022  BP: 107/68  Pulse: 83  Temp: 97.8 F (36.6 C)  TempSrc: Temporal  Weight: 110 lb 8 oz (50.1 kg)   Body mass index is 20.21 kg/m.  General:  WDWN in NAD; vital signs documented above Gait: Not observed HENT: WNL, normocephalic Pulmonary: normal non-labored breathing  Cardiac: Regular Abdomen: soft, NT, no masses Skin: without rashes Vascular Exam/Pulses: Nonpalpable pedal pulses bilaterally.  Monophasic right DP and peroneal Doppler signals Extremities: without ischemic changes, without gangrene , without cellulitis; without open wounds;  Musculoskeletal: no muscle wasting or atrophy  Neurologic: A&O X 3;  No focal weakness or paresthesias are detected Psychiatric:  The pt has Normal affect.   Medical Decision Making   Ariel Holmes is a 83 y.o. female who presents for follow-up  The patient has a history of right lower extremity claudication which significantly worsened earlier this year.  She was not able to walk more than 1 block before developing severe right calf cramping.  She was started on Pletal  to help with her symptoms. At follow-up today she says she is feeling much better.  Her claudication has not completely gone away, however she can now walk at least a mile before her calf bothers her a little bit.  She is still walking regularly around her neighborhood. She also denies any rest pain or tissue loss On exam she has nonpalpable pedal pulses.  She has a monophasic right DP and peroneal Doppler signal I discussed with the patient that she continues to have no indications for revascularization, including rest pain or tissue loss.  She should continue her Pletal  for claudication.  She can follow-up with our office in 6 months with repeat right lower extremity arterial duplex and ABIs   Ahmed Holster PA-C [facility] Office: [phone]  Clinic MD: [place]

## 2023-12-12 ENCOUNTER — Other Ambulatory Visit: Payer: Self-pay

## 2023-12-12 DIAGNOSIS — I739 Peripheral vascular disease, unspecified: Secondary | ICD-10-CM

## 2023-12-26 ENCOUNTER — Encounter (HOSPITAL_BASED_OUTPATIENT_CLINIC_OR_DEPARTMENT_OTHER): Payer: Self-pay | Admitting: Obstetrics & Gynecology

## 2023-12-26 ENCOUNTER — Ambulatory Visit (HOSPITAL_BASED_OUTPATIENT_CLINIC_OR_DEPARTMENT_OTHER): Admitting: Obstetrics & Gynecology

## 2023-12-26 VITALS — BP 133/80 | HR 72 | Wt 106.8 lb

## 2023-12-26 DIAGNOSIS — K649 Unspecified hemorrhoids: Secondary | ICD-10-CM | POA: Diagnosis not present

## 2023-12-26 DIAGNOSIS — R3915 Urgency of urination: Secondary | ICD-10-CM | POA: Diagnosis not present

## 2023-12-26 DIAGNOSIS — N8110 Cystocele, unspecified: Secondary | ICD-10-CM

## 2023-12-26 LAB — POCT URINALYSIS DIP (CLINITEK)
Bilirubin, UA: NEGATIVE
Glucose, UA: NEGATIVE mg/dL
Ketones, POC UA: NEGATIVE mg/dL
Nitrite, UA: NEGATIVE
POC PROTEIN,UA: NEGATIVE
Spec Grav, UA: >=1.03 — AB (ref 1.010–1.025)
Urobilinogen, UA: 0.2 U/dL
pH, UA: 5.5 (ref 5.0–8.0)

## 2023-12-26 MED ORDER — HYDROCORTISONE (PERIANAL) 2.5 % EX CREA: TOPICAL_CREAM | Freq: Two times a day (BID) | CUTANEOUS | 2 refills | Status: AC

## 2023-12-26 MED ORDER — ESTRADIOL 0.01 % VA CREA
TOPICAL_CREAM | VAGINAL | 6 refills | Status: AC
Start: 2023-12-26 — End: ?

## 2023-12-26 NOTE — Progress Notes (Unsigned)
   GYNECOLOGY  VISIT  CC:  prolapse, possible pessary placement    HPI: 83 y.o. G88P1001 [name] here with complaints of prolapse that has been more symptomatic for her.  She is now having trouble with voiding and will just urinate a little bit at a time.  She would like to have her pessary replaced at this time.    Patient's last menstrual period was 02/16/1988 (lmp unknown).  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/2011   Multiple myeloma (HCC)    followed at [facility]   MVP (mitral valve prolapse)    Osteopenia    Peripheral vascular disease    Prolapsed uterus    Raynaud disease    Varicose vein     MEDS:  Reviewed in EPIC  ALLERGIES: Ciprofloxacin , Codeine, Macrobid  [nitrofurantoin ], Pholcodine, and Sulfa  antibiotics  SH:  ***  ROS  PHYSICAL EXAMINATION:    LMP 02/16/1988 (LMP Unknown)     General appearance: alert, cooperative and appears stated age Neck: no adenopathy, supple, symmetrical, trachea midline and thyroid  {CHL AMB PHY EX THYROID  NORM [phone]::normal to inspection and palpation} CV:  {Exam; heart brief:31539} Lungs:  {pe lungs ob:314451} Breasts: {Exam; breast:13139::normal appearance, no masses or tenderness} Abdomen: soft, non-tender; bowel sounds normal; no masses,  no organomegaly Lymph:  no inguinal LAD noted  Pelvic: External genitalia:  no lesions              Urethra:  normal appearing urethra with no masses, tenderness or lesions              Bartholins and Skenes: normal                 Vagina: {exam; pelvic vaginal:30846}              Cervix: {CHL AMB PHY EX CERVIX NORM DEFAULT:[phone]::no lesions}              Bimanual Exam:  Uterus:  {CHL AMB PHY EX UTERUS NORM [phone]::normal size, contour,  position, consistency, mobility, non-tender}              Adnexa: {CHL AMB PHY EX ADNEXA NO MASS DEFAULT:[phone]::no mass, fullness, tenderness}              Rectovaginal: {yes no:[id number]}.  Confirms.              Anus:  normal sphincter tone, no lesions  Chaperone was present for exam.  Assessment/Plan: There are no diagnoses linked to this encounter.

## 2023-12-27 ENCOUNTER — Encounter (HOSPITAL_BASED_OUTPATIENT_CLINIC_OR_DEPARTMENT_OTHER): Payer: Self-pay | Admitting: Obstetrics & Gynecology

## 2023-12-28 ENCOUNTER — Ambulatory Visit (HOSPITAL_BASED_OUTPATIENT_CLINIC_OR_DEPARTMENT_OTHER): Admitting: Obstetrics & Gynecology

## 2023-12-30 LAB — URINE CULTURE

## 2024-01-01 ENCOUNTER — Ambulatory Visit (HOSPITAL_BASED_OUTPATIENT_CLINIC_OR_DEPARTMENT_OTHER): Payer: Self-pay | Admitting: Obstetrics & Gynecology

## 2024-01-01 DIAGNOSIS — N3000 Acute cystitis without hematuria: Secondary | ICD-10-CM

## 2024-01-01 MED ORDER — CEFADROXIL 500 MG PO CAPS: 500.0000 mg | ORAL_CAPSULE | Freq: Two times a day (BID) | ORAL | 0 refills | Status: DC

## 2024-01-05 ENCOUNTER — Other Ambulatory Visit: Payer: Self-pay | Admitting: Cardiology

## 2024-01-05 NOTE — Telephone Encounter (Signed)
 Prescription refill request for Eliquis  received. Indication:AFIB Last office visit:4/25 Scr: 0.66  11/25 Age:83 Weight:48.4  KG  PRESCRIPTION REFILLED

## 2024-01-23 DIAGNOSIS — H40053 Ocular hypertension, bilateral: Secondary | ICD-10-CM | POA: Diagnosis not present

## 2024-01-23 DIAGNOSIS — Z961 Presence of intraocular lens: Secondary | ICD-10-CM | POA: Diagnosis not present

## 2024-01-23 DIAGNOSIS — H353131 Nonexudative age-related macular degeneration, bilateral, early dry stage: Secondary | ICD-10-CM | POA: Diagnosis not present

## 2024-02-29 ENCOUNTER — Encounter (HOSPITAL_BASED_OUTPATIENT_CLINIC_OR_DEPARTMENT_OTHER): Payer: Self-pay

## 2024-02-29 ENCOUNTER — Ambulatory Visit (HOSPITAL_BASED_OUTPATIENT_CLINIC_OR_DEPARTMENT_OTHER)

## 2024-02-29 ENCOUNTER — Other Ambulatory Visit (HOSPITAL_COMMUNITY)
Admission: RE | Admit: 2024-02-29 | Discharge: 2024-02-29 | Disposition: A | Discharge status: Home / Self Care | Source: Ambulatory Visit | Attending: Obstetrics & Gynecology | Admitting: Obstetrics & Gynecology

## 2024-02-29 VITALS — BP 108/72 | HR 88 | Ht 62.0 in | Wt 109.4 lb

## 2024-02-29 DIAGNOSIS — R8290 Unspecified abnormal findings in urine: Secondary | ICD-10-CM | POA: Diagnosis not present

## 2024-02-29 DIAGNOSIS — R300 Dysuria: Secondary | ICD-10-CM

## 2024-02-29 DIAGNOSIS — N898 Other specified noninflammatory disorders of vagina: Secondary | ICD-10-CM | POA: Diagnosis present

## 2024-02-29 LAB — POCT URINALYSIS DIPSTICK
Bilirubin, UA: NEGATIVE
Glucose, UA: NEGATIVE
Ketones, UA: NEGATIVE
Nitrite, UA: NEGATIVE
Protein, UA: NEGATIVE
Spec Grav, UA: 1.015
Urobilinogen, UA: 0.2 U/dL
pH, UA: 5.5

## 2024-02-29 NOTE — Progress Notes (Signed)
 NURSE VISIT- UTI SYMPTOMS   SUBJECTIVE:  Ariel Holmes is a 84 y.o. G56P1001 female here for UTI symptoms. She is a GYN patient. She reports cloudy urine and dysuria.  OBJECTIVE:  BP 108/72   Pulse 88   Ht 5' 2 (1.575 m) Comment: Reported  Wt 109 lb 6.4 oz (49.6 kg)   LMP 02/16/1988   BMI 20.01 kg/m   Appears well, in no apparent distress  No results found for this or any previous visit (from the past 24 hours).  ASSESSMENT: GYN patient with UTI symptoms and negative nitrites  PLAN: Visit routed to or discussed with:  Rx sent today: No Urine culture sent Call or return to clinic prn if these symptoms worsen or fail to improve as anticipated. Follow-up: as needed  NURSE VISIT- VAGINITIS/STD/POC  SUBJECTIVE:  Ariel Holmes is a 84 y.o. G1P1001 GYN patientfemale here for a vaginal swab for vaginitis screening.  She reports the following symptoms: vulvar itching for several days. Denies abnormal vaginal bleeding, significant pelvic pain, fever, or UTI symptoms.  OBJECTIVE:  BP 108/72   Pulse 88   Ht 5' 2 (1.575 m) Comment: Reported  Wt 109 lb 6.4 oz (49.6 kg)   LMP 02/16/1988   BMI 20.01 kg/m   Appears well, in no apparent distress  ASSESSMENT: Vaginal swab for vaginitis screening  PLAN: Self-collected vaginal probe for Bacterial Vaginosis, Yeast sent to lab Treatment: to be determined once results are received Follow-up as needed if symptoms persist/worsen, or new symptoms develop

## 2024-03-01 LAB — CERVICOVAGINAL ANCILLARY ONLY
Bacterial Vaginitis (gardnerella): NEGATIVE
Candida Glabrata: NEGATIVE
Candida Vaginitis: NEGATIVE
Comment: NEGATIVE
Comment: NEGATIVE
Comment: NEGATIVE

## 2024-03-02 LAB — URINE CULTURE

## 2024-03-05 ENCOUNTER — Ambulatory Visit (HOSPITAL_BASED_OUTPATIENT_CLINIC_OR_DEPARTMENT_OTHER): Payer: Self-pay | Admitting: Obstetrics & Gynecology

## 2024-03-06 ENCOUNTER — Other Ambulatory Visit (HOSPITAL_COMMUNITY)
Admission: RE | Admit: 2024-03-06 | Discharge: 2024-03-06 | Disposition: A | Discharge status: Home / Self Care | Source: Ambulatory Visit | Attending: Obstetrics & Gynecology | Admitting: Obstetrics & Gynecology

## 2024-03-06 ENCOUNTER — Other Ambulatory Visit (HOSPITAL_BASED_OUTPATIENT_CLINIC_OR_DEPARTMENT_OTHER)

## 2024-03-06 ENCOUNTER — Other Ambulatory Visit (INDEPENDENT_AMBULATORY_CARE_PROVIDER_SITE_OTHER)

## 2024-03-06 VITALS — BP 128/67 | HR 72 | Wt 108.6 lb

## 2024-03-06 DIAGNOSIS — R300 Dysuria: Secondary | ICD-10-CM | POA: Diagnosis not present

## 2024-03-06 DIAGNOSIS — N898 Other specified noninflammatory disorders of vagina: Secondary | ICD-10-CM | POA: Insufficient documentation

## 2024-03-06 LAB — POCT URINALYSIS DIP (CLINITEK)
Bilirubin, UA: NEGATIVE
Glucose, UA: NEGATIVE mg/dL
Ketones, POC UA: NEGATIVE mg/dL
Nitrite, UA: NEGATIVE
POC PROTEIN,UA: 30 — AB
Spec Grav, UA: 1.025
Urobilinogen, UA: 0.2 U/dL
pH, UA: 5.5

## 2024-03-06 NOTE — Progress Notes (Signed)
 NURSE VISIT- UTI AND VAGINITIS SYMPTOMS   SUBJECTIVE:  Ariel Holmes is a 84 y.o. G54P1001 female here for UTI and vaginitis symptoms. She is a GYN patient. She reports dysuria, vaginal irritation, and vaginal itching.  OBJECTIVE:  BP 128/67 (BP Location: Right Arm, Patient Position: Sitting, Cuff Size: Normal)   Pulse 72   Wt 108 lb 9.6 oz (49.3 kg)   LMP 02/16/1988   SpO2 100%   BMI 19.86 kg/m   Appears well, in no apparent distress  Results for orders placed or performed in visit on 03/06/24 (from the past 24 hours)  POCT URINALYSIS DIP (CLINITEK)   Collection Time: 03/06/24 11:24 AM  Result Value Ref Range   Color, UA yellow yellow   Clarity, UA cloudy (A) clear   Glucose, UA negative negative mg/dL   Bilirubin, UA negative negative   Ketones, POC UA negative negative mg/dL   Spec Grav, UA 8.974 8.989 - 1.025   Blood, UA small (A) negative   pH, UA 5.5 5.0 - 8.0   POC PROTEIN,UA =30 (A) negative, trace   Urobilinogen, UA 0.2 0.2 or 1.0 E.U./dL   Nitrite, UA Negative Negative   Leukocytes, UA Large (3+) (A) Negative    ASSESSMENT: GYN patient with UTI symptoms and negative nitrites.  PLAN: Rx sent today: No Urine culture sent Aptima swab sent. Call or return to clinic prn if these symptoms worsen or fail to improve as anticipated. Follow-up: as scheduled

## 2024-03-07 LAB — CERVICOVAGINAL ANCILLARY ONLY
Bacterial Vaginitis (gardnerella): NEGATIVE
Candida Glabrata: NEGATIVE
Candida Vaginitis: NEGATIVE
Comment: NEGATIVE
Comment: NEGATIVE
Comment: NEGATIVE

## 2024-03-09 ENCOUNTER — Encounter (HOSPITAL_BASED_OUTPATIENT_CLINIC_OR_DEPARTMENT_OTHER): Payer: Self-pay | Admitting: Obstetrics & Gynecology

## 2024-03-09 ENCOUNTER — Ambulatory Visit (HOSPITAL_BASED_OUTPATIENT_CLINIC_OR_DEPARTMENT_OTHER): Payer: Self-pay | Admitting: Obstetrics & Gynecology

## 2024-03-09 VITALS — BP 121/71 | HR 69 | Wt 109.6 lb

## 2024-03-09 DIAGNOSIS — R300 Dysuria: Secondary | ICD-10-CM | POA: Diagnosis not present

## 2024-03-09 DIAGNOSIS — N3000 Acute cystitis without hematuria: Secondary | ICD-10-CM

## 2024-03-09 DIAGNOSIS — Z4689 Encounter for fitting and adjustment of other specified devices: Secondary | ICD-10-CM | POA: Diagnosis not present

## 2024-03-09 DIAGNOSIS — T8389XA Other specified complication of genitourinary prosthetic devices, implants and grafts, initial encounter: Secondary | ICD-10-CM | POA: Diagnosis not present

## 2024-03-09 DIAGNOSIS — N765 Ulceration of vagina: Secondary | ICD-10-CM

## 2024-03-09 DIAGNOSIS — N8110 Cystocele, unspecified: Secondary | ICD-10-CM | POA: Diagnosis not present

## 2024-03-09 LAB — POCT URINALYSIS DIP (CLINITEK)
Bilirubin, UA: NEGATIVE
Glucose, UA: NEGATIVE mg/dL
Ketones, POC UA: NEGATIVE mg/dL
Nitrite, UA: NEGATIVE
POC PROTEIN,UA: NEGATIVE
Spec Grav, UA: 1.025
Urobilinogen, UA: 0.2 U/dL
pH, UA: 5.5

## 2024-03-09 LAB — URINE CULTURE

## 2024-03-09 MED ORDER — CEFADROXIL 500 MG PO CAPS: 500.0000 mg | ORAL_CAPSULE | Freq: Two times a day (BID) | ORAL | 0 refills | Status: AC

## 2024-03-09 NOTE — Progress Notes (Signed)
" ° °  CC: pessary check  HPI:  84 y.o. Widowed White female G1P1001 here for pessary check.  She reports no issues or concerns today.  She is not sexually active.  She reports dysuria. Her urine culture results from 03/07/2023 are still pending. Reviewed POCT from today.  Feel treatment appropriate before culture is finalized.  She reports that this has been going on since about the end of December.    Patient has been using following pessary style and size:  #3 Milex incontinence ring pessary with support.  All: cipro , macrobid , sulfa , codeine   ROS: + vaginal discharge, urinary urgency Denies:  vaginal bleeding  Exam:   Wt 109 lb 9.6 oz (49.7 kg)   LMP 02/16/1988   BMI 20.05 kg/m   General appearance: alert and no distress Inguinal lymph nodes:  not enlarged  Pelvic: External genitalia:  no lesions              Urethra: normal appearing urethra with no masses, tenderness or lesions              Bartholins and Skenes: Bartholin's, Urethra, Skene's normal                 Vagina: superficial vaginal ulceration of tissue in posterior fornix after removal of pessary              Cervix: normal appearance   Pap obtained:  no  Pessary was removed without difficulty.  Pessary was cleansed.  Pessary was not replaced. Patient tolerated procedure well.    Assessment/Plan: 1. 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 Pessary maintenance  3. Vaginal ulceration - pt has vaginal estradiol  cream and will use this next month.  4. Dysuria - POCT URINALYSIS DIP (CLINITEK)  5. Acute cystitis without hematuria - cefadroxil  (DURICEF) 500 MG capsule; Take 1 capsule (500 mg total) by mouth 2 (two) times daily.  Dispense: 10 capsule; Refill: 0   "

## 2024-03-10 ENCOUNTER — Ambulatory Visit (HOSPITAL_BASED_OUTPATIENT_CLINIC_OR_DEPARTMENT_OTHER): Payer: Self-pay | Admitting: Obstetrics & Gynecology

## 2024-03-19 ENCOUNTER — Ambulatory Visit (HOSPITAL_BASED_OUTPATIENT_CLINIC_OR_DEPARTMENT_OTHER): Payer: Self-pay

## 2024-03-20 NOTE — Telephone Encounter (Signed)
 Pt rescheduled to Friday KD

## 2024-03-23 ENCOUNTER — Encounter (HOSPITAL_BASED_OUTPATIENT_CLINIC_OR_DEPARTMENT_OTHER): Payer: Self-pay

## 2024-03-23 ENCOUNTER — Ambulatory Visit (HOSPITAL_BASED_OUTPATIENT_CLINIC_OR_DEPARTMENT_OTHER)

## 2024-03-23 VITALS — BP 113/64 | HR 68

## 2024-03-23 DIAGNOSIS — Z87440 Personal history of urinary (tract) infections: Secondary | ICD-10-CM

## 2024-03-23 NOTE — Progress Notes (Signed)
 NURSE VISIT- UTI SYMPTOMS   SUBJECTIVE:  Ariel Holmes is a 84 y.o. G47P1001 female here for follow up urine culture from previous UTI. She is a GYN patient. She reports no UTI symptoms.  OBJECTIVE:  LMP 02/16/1988   Appears well, in no apparent distress  No results found for this or any previous visit (from the past 24 hours).  ASSESSMENT: GYN patient with no UTI symptoms   PLAN: Visit routed to or discussed with:  Rx sent today: No Urine culture sent Call or return to clinic prn if these symptoms worsen or fail to improve as anticipated. Follow-up: as needed   Morna LOISE Quale, RN

## 2024-03-26 ENCOUNTER — Ambulatory Visit (HOSPITAL_BASED_OUTPATIENT_CLINIC_OR_DEPARTMENT_OTHER): Admitting: Obstetrics & Gynecology

## 2024-04-10 ENCOUNTER — Ambulatory Visit (HOSPITAL_BASED_OUTPATIENT_CLINIC_OR_DEPARTMENT_OTHER): Payer: Self-pay | Admitting: Obstetrics & Gynecology

## 2024-05-31 ENCOUNTER — Ambulatory Visit (HOSPITAL_COMMUNITY)

## 2024-05-31 ENCOUNTER — Ambulatory Visit

## 2024-06-08 ENCOUNTER — Ambulatory Visit: Admitting: Cardiology
